# Patient Record
Sex: Male | Born: 1935 | Race: White | Hispanic: No | Smoking: Former smoker
Health system: Southern US, Community
[De-identification: ages and names within clinical notes are randomized; demographics above are authoritative.]

## PROBLEM LIST (undated history)

## (undated) DIAGNOSIS — T8203XA Leakage of heart valve prosthesis, initial encounter: Secondary | ICD-10-CM

## (undated) DIAGNOSIS — Z952 Presence of prosthetic heart valve: Secondary | ICD-10-CM

## (undated) DIAGNOSIS — R0602 Shortness of breath: Secondary | ICD-10-CM

## (undated) DIAGNOSIS — F32A Depression, unspecified: Secondary | ICD-10-CM

## (undated) DIAGNOSIS — K219 Gastro-esophageal reflux disease without esophagitis: Secondary | ICD-10-CM

## (undated) DIAGNOSIS — F1010 Alcohol abuse, uncomplicated: Secondary | ICD-10-CM

## (undated) DIAGNOSIS — I714 Abdominal aortic aneurysm, without rupture: Secondary | ICD-10-CM

## (undated) DIAGNOSIS — R42 Dizziness and giddiness: Secondary | ICD-10-CM

## (undated) DIAGNOSIS — M545 Low back pain, unspecified: Secondary | ICD-10-CM

## (undated) DIAGNOSIS — M1990 Unspecified osteoarthritis, unspecified site: Secondary | ICD-10-CM

## (undated) DIAGNOSIS — M419 Scoliosis, unspecified: Secondary | ICD-10-CM

## (undated) DIAGNOSIS — T8859XA Other complications of anesthesia, initial encounter: Secondary | ICD-10-CM

## (undated) DIAGNOSIS — F329 Major depressive disorder, single episode, unspecified: Secondary | ICD-10-CM

## (undated) DIAGNOSIS — T4145XA Adverse effect of unspecified anesthetic, initial encounter: Secondary | ICD-10-CM

## (undated) DIAGNOSIS — J449 Chronic obstructive pulmonary disease, unspecified: Secondary | ICD-10-CM

## (undated) DIAGNOSIS — G8929 Other chronic pain: Secondary | ICD-10-CM

## (undated) DIAGNOSIS — I509 Heart failure, unspecified: Secondary | ICD-10-CM

## (undated) DIAGNOSIS — I10 Essential (primary) hypertension: Secondary | ICD-10-CM

## (undated) HISTORY — DX: Leakage of heart valve prosthesis, initial encounter: T82.03XA

## (undated) HISTORY — DX: Essential (primary) hypertension: I10

## (undated) HISTORY — DX: Chronic obstructive pulmonary disease, unspecified: J44.9

## (undated) HISTORY — DX: Abdominal aortic aneurysm, without rupture: I71.4

## (undated) HISTORY — DX: Depression, unspecified: F32.A

## (undated) HISTORY — DX: Gastro-esophageal reflux disease without esophagitis: K21.9

## (undated) HISTORY — DX: Major depressive disorder, single episode, unspecified: F32.9

## (undated) HISTORY — PX: NO PAST SURGERIES: SHX2092

## (undated) HISTORY — DX: Dizziness and giddiness: R42

---

## 1997-08-19 ENCOUNTER — Ambulatory Visit (HOSPITAL_COMMUNITY): Admission: RE | Admit: 1997-08-19 | Discharge: 1997-08-19 | Payer: Self-pay | Admitting: Internal Medicine

## 1998-08-04 ENCOUNTER — Ambulatory Visit (HOSPITAL_COMMUNITY): Admission: RE | Admit: 1998-08-04 | Discharge: 1998-08-04 | Payer: Self-pay | Admitting: Internal Medicine

## 2000-12-28 ENCOUNTER — Ambulatory Visit (HOSPITAL_COMMUNITY): Admission: RE | Admit: 2000-12-28 | Discharge: 2000-12-28 | Payer: Self-pay | Admitting: Internal Medicine

## 2001-03-21 ENCOUNTER — Emergency Department (HOSPITAL_COMMUNITY): Admission: EM | Admit: 2001-03-21 | Discharge: 2001-03-21 | Payer: Self-pay | Admitting: Emergency Medicine

## 2001-04-02 ENCOUNTER — Emergency Department (HOSPITAL_COMMUNITY): Admission: EM | Admit: 2001-04-02 | Discharge: 2001-04-02 | Payer: Self-pay | Admitting: Emergency Medicine

## 2002-02-12 ENCOUNTER — Inpatient Hospital Stay (HOSPITAL_COMMUNITY): Admission: EM | Admit: 2002-02-12 | Discharge: 2002-02-16 | Payer: Self-pay | Admitting: Psychiatry

## 2002-02-13 ENCOUNTER — Encounter (HOSPITAL_COMMUNITY): Payer: Self-pay | Admitting: Psychiatry

## 2005-06-23 ENCOUNTER — Emergency Department (HOSPITAL_COMMUNITY): Admission: EM | Admit: 2005-06-23 | Discharge: 2005-06-23 | Payer: Self-pay | Admitting: Family Medicine

## 2006-07-11 ENCOUNTER — Ambulatory Visit: Payer: Self-pay | Admitting: *Deleted

## 2006-07-11 ENCOUNTER — Encounter (INDEPENDENT_AMBULATORY_CARE_PROVIDER_SITE_OTHER): Payer: Self-pay | Admitting: *Deleted

## 2006-07-11 DIAGNOSIS — R1031 Right lower quadrant pain: Secondary | ICD-10-CM | POA: Insufficient documentation

## 2006-07-11 DIAGNOSIS — R011 Cardiac murmur, unspecified: Secondary | ICD-10-CM | POA: Insufficient documentation

## 2006-07-11 DIAGNOSIS — R0989 Other specified symptoms and signs involving the circulatory and respiratory systems: Secondary | ICD-10-CM

## 2006-07-11 DIAGNOSIS — R0609 Other forms of dyspnea: Secondary | ICD-10-CM

## 2006-07-11 LAB — CONVERTED CEMR LAB
Bilirubin Urine: NEGATIVE
Protein, ur: NEGATIVE mg/dL
Urobilinogen, UA: 0.2 (ref 0.0–1.0)

## 2006-07-12 ENCOUNTER — Ambulatory Visit: Payer: Self-pay | Admitting: Internal Medicine

## 2006-07-12 ENCOUNTER — Encounter (INDEPENDENT_AMBULATORY_CARE_PROVIDER_SITE_OTHER): Payer: Self-pay | Admitting: *Deleted

## 2006-07-12 LAB — CONVERTED CEMR LAB
AST: 24 units/L (ref 0–37)
Albumin: 3.9 g/dL (ref 3.5–5.2)
Alkaline Phosphatase: 60 units/L (ref 39–117)
Basophils Absolute: 0 10*3/uL (ref 0.0–0.1)
Basophils Relative: 1 % (ref 0–1)
Eosinophils Absolute: 0.2 10*3/uL (ref 0.0–0.7)
LDL Cholesterol: 111 mg/dL — ABNORMAL HIGH (ref 0–99)
MCHC: 33.8 g/dL (ref 30.0–36.0)
MCV: 94.1 fL (ref 78.0–100.0)
Neutrophils Relative %: 55 % (ref 43–77)
Platelets: 140 10*3/uL — ABNORMAL LOW (ref 150–400)
Potassium: 4.2 meq/L (ref 3.5–5.3)
Sodium: 142 meq/L (ref 135–145)
TSH: 1.201 microintl units/mL (ref 0.350–5.50)
Total Protein: 7.1 g/dL (ref 6.0–8.3)
WBC: 4 10*3/uL (ref 4.0–10.5)

## 2006-07-18 ENCOUNTER — Ambulatory Visit: Payer: Self-pay | Admitting: Internal Medicine

## 2006-07-18 ENCOUNTER — Ambulatory Visit (HOSPITAL_COMMUNITY): Admission: RE | Admit: 2006-07-18 | Discharge: 2006-07-18 | Payer: Self-pay | Admitting: Internal Medicine

## 2006-07-18 ENCOUNTER — Encounter: Payer: Self-pay | Admitting: Internal Medicine

## 2006-07-18 ENCOUNTER — Encounter (INDEPENDENT_AMBULATORY_CARE_PROVIDER_SITE_OTHER): Payer: Self-pay | Admitting: Internal Medicine

## 2006-07-25 ENCOUNTER — Ambulatory Visit: Payer: Self-pay | Admitting: Internal Medicine

## 2006-07-25 ENCOUNTER — Encounter (INDEPENDENT_AMBULATORY_CARE_PROVIDER_SITE_OTHER): Payer: Self-pay | Admitting: *Deleted

## 2006-08-06 ENCOUNTER — Ambulatory Visit: Payer: Self-pay | Admitting: Cardiology

## 2007-07-03 ENCOUNTER — Emergency Department (HOSPITAL_COMMUNITY): Admission: EM | Admit: 2007-07-03 | Discharge: 2007-07-03 | Payer: Self-pay | Admitting: Emergency Medicine

## 2007-07-08 ENCOUNTER — Encounter (INDEPENDENT_AMBULATORY_CARE_PROVIDER_SITE_OTHER): Payer: Self-pay | Admitting: *Deleted

## 2007-07-08 ENCOUNTER — Ambulatory Visit: Payer: Self-pay | Admitting: Infectious Disease

## 2007-07-08 DIAGNOSIS — R51 Headache: Secondary | ICD-10-CM

## 2007-07-08 DIAGNOSIS — R519 Headache, unspecified: Secondary | ICD-10-CM | POA: Insufficient documentation

## 2007-07-08 DIAGNOSIS — F1010 Alcohol abuse, uncomplicated: Secondary | ICD-10-CM

## 2007-07-09 LAB — CONVERTED CEMR LAB
ALT: 54 units/L — ABNORMAL HIGH (ref 0–53)
AST: 74 units/L — ABNORMAL HIGH (ref 0–37)
Albumin: 3.7 g/dL (ref 3.5–5.2)
BUN: 8 mg/dL (ref 6–23)
Basophils Relative: 1 % (ref 0–1)
Calcium: 8.8 mg/dL (ref 8.4–10.5)
Chloride: 105 meq/L (ref 96–112)
MCHC: 34.9 g/dL (ref 30.0–36.0)
Monocytes Relative: 12 % (ref 3–12)
Neutro Abs: 2.8 10*3/uL (ref 1.7–7.7)
Neutrophils Relative %: 52 % (ref 43–77)
Platelets: 185 10*3/uL (ref 150–400)
Potassium: 4.5 meq/L (ref 3.5–5.3)
RBC: 4.64 M/uL (ref 4.22–5.81)
Sodium: 143 meq/L (ref 135–145)
Total Protein: 6.9 g/dL (ref 6.0–8.3)
WBC: 5.5 10*3/uL (ref 4.0–10.5)

## 2007-07-10 ENCOUNTER — Encounter: Payer: Self-pay | Admitting: Internal Medicine

## 2007-07-10 ENCOUNTER — Ambulatory Visit: Payer: Self-pay | Admitting: Internal Medicine

## 2007-07-10 ENCOUNTER — Ambulatory Visit: Admission: RE | Admit: 2007-07-10 | Discharge: 2007-07-10 | Payer: Self-pay | Admitting: Internal Medicine

## 2007-07-11 ENCOUNTER — Encounter (INDEPENDENT_AMBULATORY_CARE_PROVIDER_SITE_OTHER): Payer: Self-pay | Admitting: *Deleted

## 2007-07-11 ENCOUNTER — Ambulatory Visit: Payer: Self-pay | Admitting: Infectious Disease

## 2007-07-11 DIAGNOSIS — R42 Dizziness and giddiness: Secondary | ICD-10-CM | POA: Insufficient documentation

## 2007-07-19 ENCOUNTER — Ambulatory Visit: Payer: Self-pay | Admitting: *Deleted

## 2007-07-19 ENCOUNTER — Ambulatory Visit: Payer: Self-pay | Admitting: Cardiology

## 2007-07-19 ENCOUNTER — Ambulatory Visit: Payer: Self-pay | Admitting: Internal Medicine

## 2007-07-19 ENCOUNTER — Inpatient Hospital Stay (HOSPITAL_COMMUNITY): Admission: AD | Admit: 2007-07-19 | Discharge: 2007-07-23 | Payer: Self-pay | Admitting: *Deleted

## 2007-07-19 DIAGNOSIS — L03119 Cellulitis of unspecified part of limb: Secondary | ICD-10-CM

## 2007-07-19 DIAGNOSIS — F329 Major depressive disorder, single episode, unspecified: Secondary | ICD-10-CM

## 2007-07-19 DIAGNOSIS — L02619 Cutaneous abscess of unspecified foot: Secondary | ICD-10-CM

## 2007-07-20 ENCOUNTER — Ambulatory Visit: Payer: Self-pay | Admitting: Vascular Surgery

## 2007-07-20 ENCOUNTER — Encounter: Payer: Self-pay | Admitting: Internal Medicine

## 2007-07-24 ENCOUNTER — Encounter (HOSPITAL_BASED_OUTPATIENT_CLINIC_OR_DEPARTMENT_OTHER): Admission: RE | Admit: 2007-07-24 | Discharge: 2007-10-22 | Payer: Self-pay | Admitting: Surgery

## 2007-07-26 ENCOUNTER — Encounter (INDEPENDENT_AMBULATORY_CARE_PROVIDER_SITE_OTHER): Payer: Self-pay | Admitting: *Deleted

## 2007-08-08 ENCOUNTER — Encounter: Payer: Self-pay | Admitting: Internal Medicine

## 2007-08-09 ENCOUNTER — Ambulatory Visit: Payer: Self-pay | Admitting: *Deleted

## 2007-08-21 ENCOUNTER — Ambulatory Visit: Payer: Self-pay | Admitting: Cardiology

## 2007-08-28 ENCOUNTER — Ambulatory Visit: Payer: Self-pay

## 2007-08-28 LAB — CONVERTED CEMR LAB
Alkaline Phosphatase: 50 units/L (ref 39–117)
Bilirubin, Direct: 0.1 mg/dL (ref 0.0–0.3)
Cholesterol: 141 mg/dL (ref 0–200)
LDL Cholesterol: 97 mg/dL (ref 0–99)
Total Bilirubin: 1.1 mg/dL (ref 0.3–1.2)
Total CHOL/HDL Ratio: 4.3
VLDL: 11 mg/dL (ref 0–40)

## 2007-09-11 ENCOUNTER — Encounter (INDEPENDENT_AMBULATORY_CARE_PROVIDER_SITE_OTHER): Payer: Self-pay | Admitting: *Deleted

## 2007-10-03 ENCOUNTER — Encounter (INDEPENDENT_AMBULATORY_CARE_PROVIDER_SITE_OTHER): Payer: Self-pay | Admitting: *Deleted

## 2007-10-10 ENCOUNTER — Encounter (INDEPENDENT_AMBULATORY_CARE_PROVIDER_SITE_OTHER): Payer: Self-pay | Admitting: *Deleted

## 2007-12-05 ENCOUNTER — Ambulatory Visit: Payer: Self-pay | Admitting: Internal Medicine

## 2008-01-16 ENCOUNTER — Inpatient Hospital Stay (HOSPITAL_COMMUNITY): Admission: AD | Admit: 2008-01-16 | Discharge: 2008-01-28 | Payer: Self-pay | Admitting: Psychiatry

## 2008-01-16 ENCOUNTER — Ambulatory Visit: Payer: Self-pay | Admitting: Psychiatry

## 2008-01-21 ENCOUNTER — Encounter (INDEPENDENT_AMBULATORY_CARE_PROVIDER_SITE_OTHER): Payer: Self-pay | Admitting: *Deleted

## 2008-03-03 ENCOUNTER — Encounter: Payer: Self-pay | Admitting: Emergency Medicine

## 2008-03-03 ENCOUNTER — Ambulatory Visit: Payer: Self-pay | Admitting: Psychiatry

## 2008-03-04 ENCOUNTER — Encounter (HOSPITAL_COMMUNITY): Payer: Self-pay | Admitting: Psychiatry

## 2008-03-04 ENCOUNTER — Inpatient Hospital Stay (HOSPITAL_COMMUNITY): Admission: RE | Admit: 2008-03-04 | Discharge: 2008-03-10 | Payer: Self-pay | Admitting: Psychiatry

## 2008-03-30 ENCOUNTER — Ambulatory Visit: Payer: Self-pay | Admitting: Cardiology

## 2008-04-09 ENCOUNTER — Ambulatory Visit: Payer: Self-pay

## 2008-04-09 ENCOUNTER — Encounter: Payer: Self-pay | Admitting: Cardiology

## 2008-09-03 ENCOUNTER — Ambulatory Visit: Payer: Self-pay | Admitting: Cardiology

## 2008-09-03 DIAGNOSIS — J449 Chronic obstructive pulmonary disease, unspecified: Secondary | ICD-10-CM

## 2008-12-29 ENCOUNTER — Encounter (INDEPENDENT_AMBULATORY_CARE_PROVIDER_SITE_OTHER): Payer: Self-pay | Admitting: *Deleted

## 2009-03-12 ENCOUNTER — Emergency Department (HOSPITAL_COMMUNITY): Admission: EM | Admit: 2009-03-12 | Discharge: 2009-03-12 | Payer: Self-pay | Admitting: Family Medicine

## 2009-04-01 ENCOUNTER — Encounter (INDEPENDENT_AMBULATORY_CARE_PROVIDER_SITE_OTHER): Payer: Self-pay | Admitting: *Deleted

## 2009-04-14 ENCOUNTER — Other Ambulatory Visit: Payer: Self-pay | Admitting: Emergency Medicine

## 2009-04-14 ENCOUNTER — Inpatient Hospital Stay (HOSPITAL_COMMUNITY): Admission: EM | Admit: 2009-04-14 | Discharge: 2009-04-21 | Payer: Self-pay | Admitting: Psychiatry

## 2009-04-14 ENCOUNTER — Ambulatory Visit: Payer: Self-pay | Admitting: Psychiatry

## 2009-05-25 ENCOUNTER — Encounter: Payer: Self-pay | Admitting: Cardiology

## 2009-05-28 ENCOUNTER — Ambulatory Visit (HOSPITAL_COMMUNITY): Admission: RE | Admit: 2009-05-28 | Discharge: 2009-05-28 | Payer: Self-pay | Admitting: Cardiology

## 2009-05-28 ENCOUNTER — Ambulatory Visit: Payer: Self-pay

## 2009-05-28 ENCOUNTER — Ambulatory Visit: Payer: Self-pay | Admitting: Internal Medicine

## 2009-05-28 ENCOUNTER — Encounter: Payer: Self-pay | Admitting: Cardiology

## 2009-05-31 ENCOUNTER — Encounter: Payer: Self-pay | Admitting: Cardiovascular Disease

## 2009-06-03 ENCOUNTER — Ambulatory Visit: Payer: Self-pay | Admitting: Cardiology

## 2009-10-01 IMAGING — CT CT HEAD W/O CM
4 of 10 series · 18 of 37 positions shown, 19 images · non-contrast
Comparison: None

CT HEAD

CLINICAL DATA: Fall.  Laceration to the back of the head.  Right
neck pain.

CT HEAD WITHOUT CONTRAST
CT CERVICAL SPINE WITHOUT CONTRAST
TECHNIQUE: Multidetector CT imaging of the head and cervical spine
was performed following the standard protocol without IV contrast.
Multiplanar CT image reconstructions of the cervical spine were
also generated.

[Series 3: recon 2: brain · axial · 0.47mm/px · z∈[-82,+3]mm · 4 of 56 slices shown, 5 images]
[im 12/56  brain]
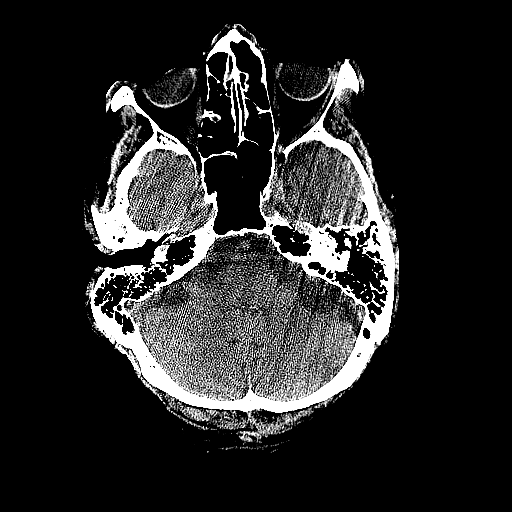
[im 12/56  bone]
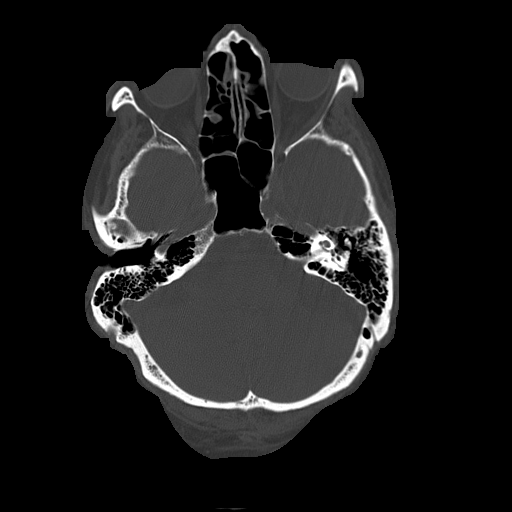
[im 23/56  brain]
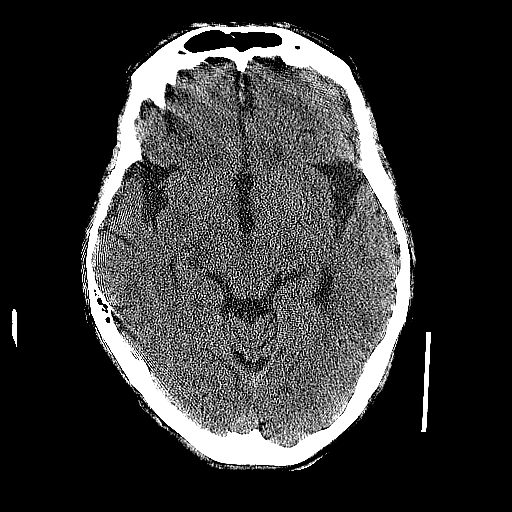
[im 34/56  brain]
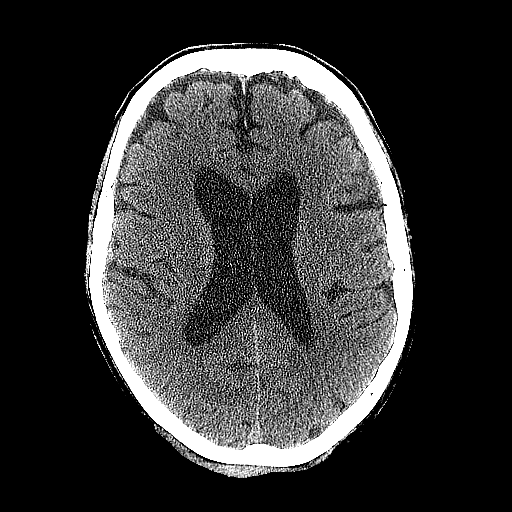
[im 45/56  brain]
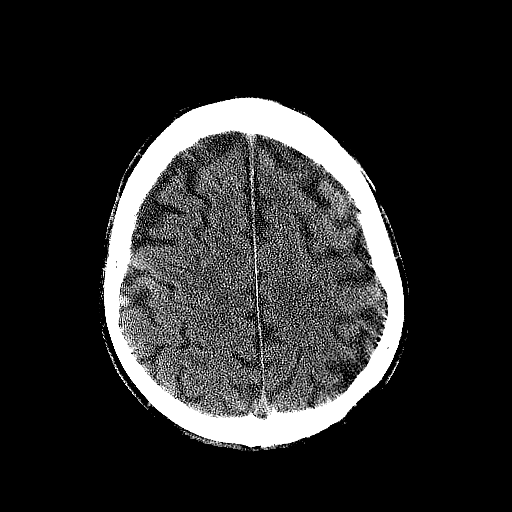

[Series 6: cervical spine · axial · 0.27mm/px · z∈[-308,-201]mm · 5 of 85 slices shown]
[im 11/85  brain]
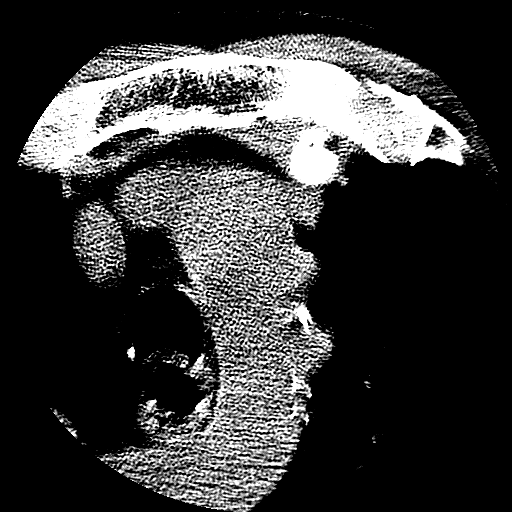
[im 22/85  brain]
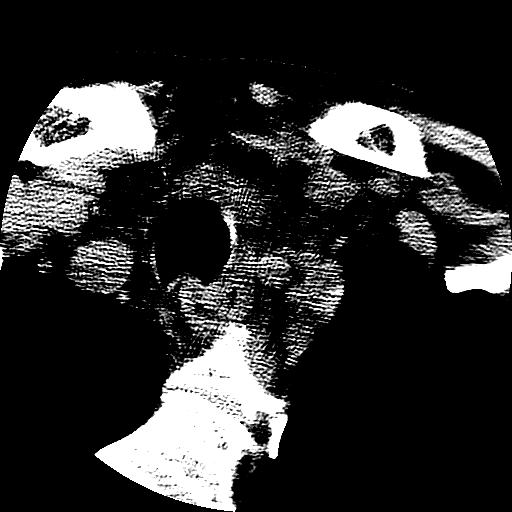
[im 32/85  brain]
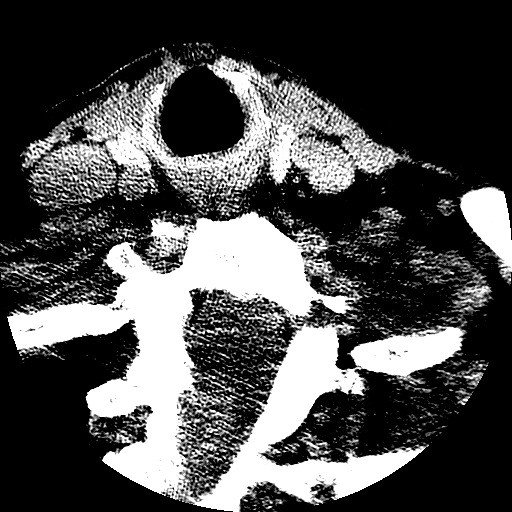
[im 43/85  brain]
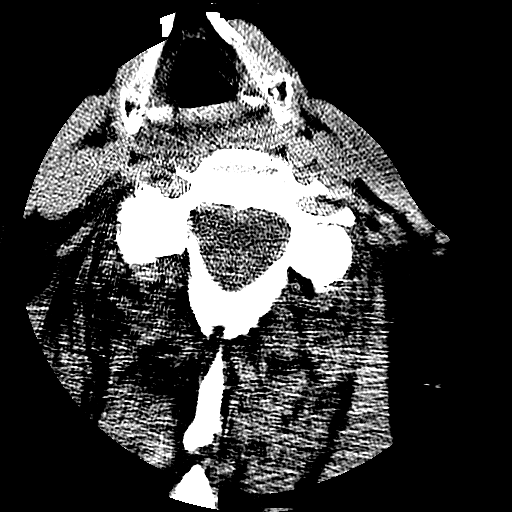
[im 53/85  brain]
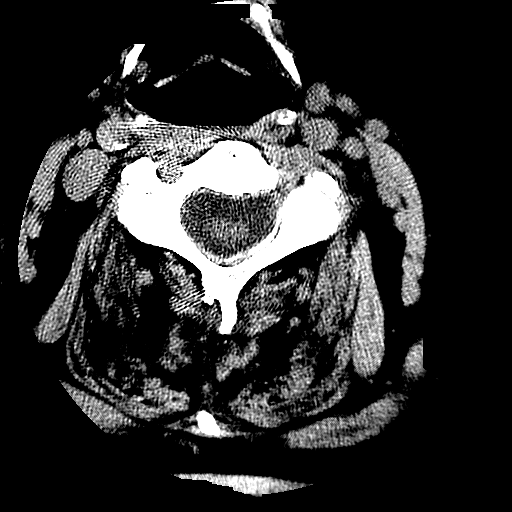

[Series 7: recon 2: cervical spine · axial · 0.27mm/px · z∈[-311,-146]mm · 8 of 86 slices shown]
[im 10/86  brain]
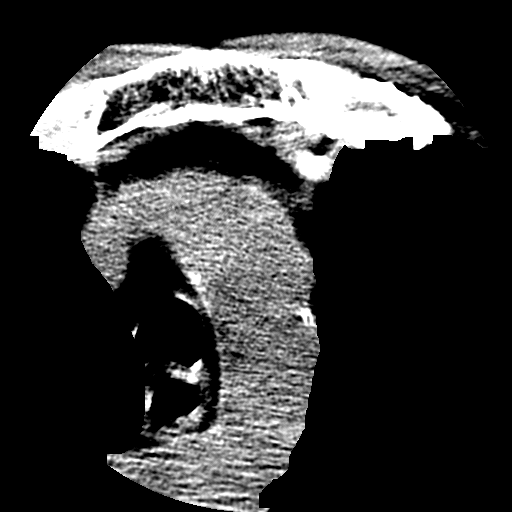
[im 19/86  brain]
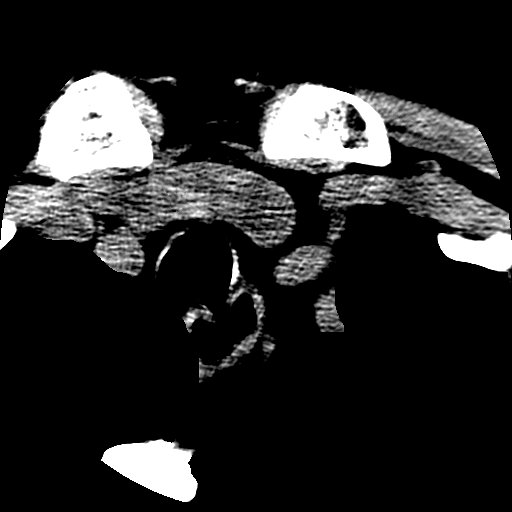
[im 29/86  brain]
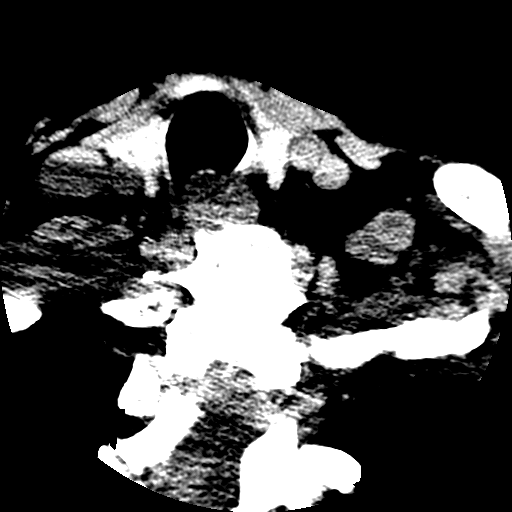
[im 38/86  brain]
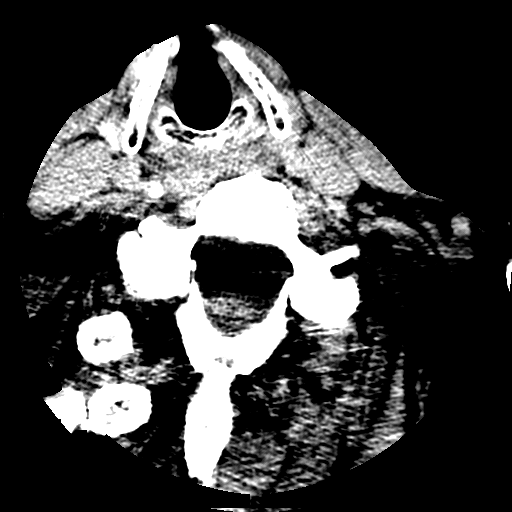
[im 48/86  brain]
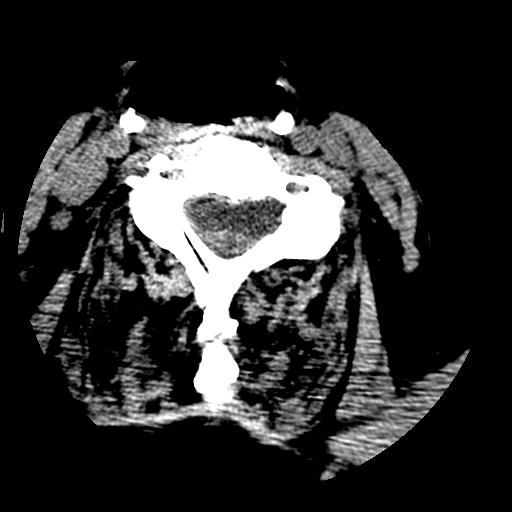
[im 57/86  brain]
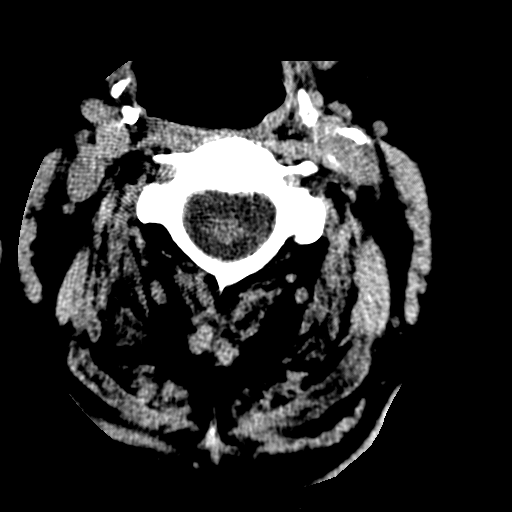
[im 67/86  brain]
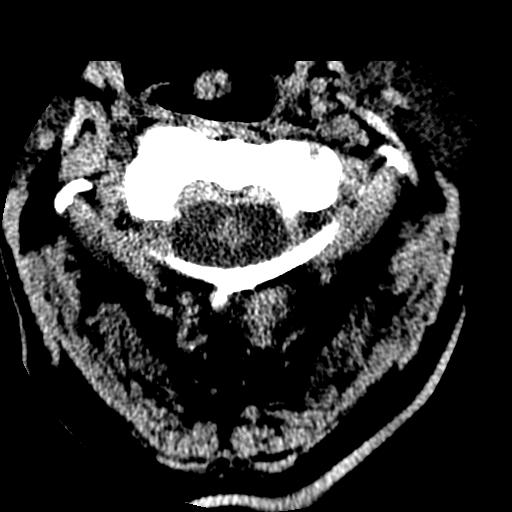
[im 76/86  brain]
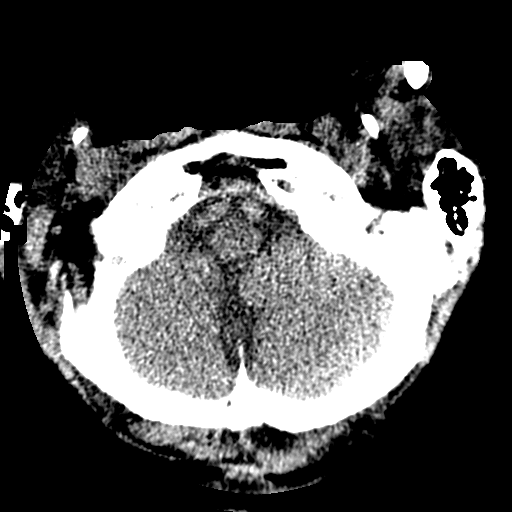

[Series 107: reformatted · coronal · 0.41mm/px · 1 of 36 slices shown]
[im 18/36  brain]
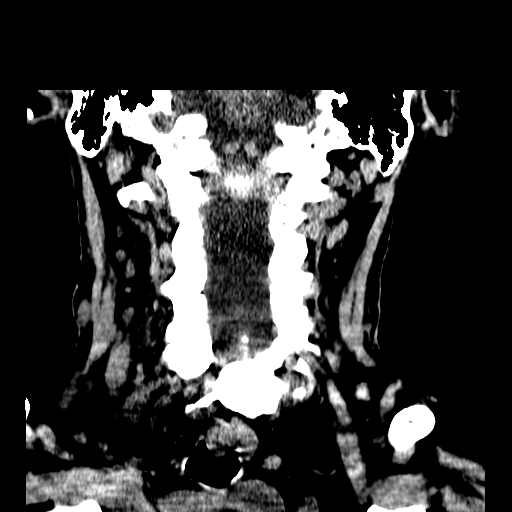

[18 of 37 positions shown; findings below may reference images not displayed]

FINDINGS: The brain stem, cerebellum, cerebral peduncles, thalami,
basal ganglia, basilar cisterns, and ventricular system appear
unremarkable.  No intracranial hemorrhage, mass lesion, or acute
CVA is identified.  Right occipital parietal subcutaneous scalp
hematoma noted.  There is mild chronic ethmoid sinusitis.
IMPRESSION: 1.  Mild right occipital parietal scalp hematoma.  No acute
intracranial findings.
2.  Mild chronic ethmoid sinusitis

CT CERVICAL SPINE
FINDINGS: There is markedly exaggerated cervical lordosis.  There
is partial fusion of the C2-3 level.  No cervical spine fracture or
acute subluxation is identified.
IMPRESSION: 1.  No cervical spine fracture or acute subluxation is identified.
2.  Markedly exaggerated cervical lordosis.

## 2010-02-09 ENCOUNTER — Telehealth (INDEPENDENT_AMBULATORY_CARE_PROVIDER_SITE_OTHER): Payer: Self-pay | Admitting: *Deleted

## 2010-03-04 ENCOUNTER — Encounter: Payer: Self-pay | Admitting: Cardiology

## 2010-03-04 ENCOUNTER — Ambulatory Visit: Payer: Self-pay | Admitting: Cardiology

## 2010-03-04 DIAGNOSIS — I10 Essential (primary) hypertension: Secondary | ICD-10-CM

## 2010-03-14 ENCOUNTER — Emergency Department (HOSPITAL_COMMUNITY)
Admission: EM | Admit: 2010-03-14 | Discharge: 2010-03-14 | Payer: Self-pay | Source: Home / Self Care | Admitting: Emergency Medicine

## 2010-03-16 ENCOUNTER — Ambulatory Visit (HOSPITAL_COMMUNITY)
Admission: RE | Admit: 2010-03-16 | Discharge: 2010-03-16 | Payer: Self-pay | Source: Home / Self Care | Attending: Cardiology | Admitting: Cardiology

## 2010-03-16 ENCOUNTER — Ambulatory Visit: Payer: Self-pay

## 2010-03-16 ENCOUNTER — Encounter: Payer: Self-pay | Admitting: Cardiology

## 2010-03-22 ENCOUNTER — Ambulatory Visit: Payer: Self-pay | Admitting: Cardiology

## 2010-05-05 NOTE — Assessment & Plan Note (Signed)
Summary: 6 MONTH ROV/SL   Visit Type:  6 mo f/u Primary Samuel [name]:  Samuel Hayward MD  CC:  sob all the time...denies any cp or edema or leg pain.  History of Present Illness: [name] is a gentleman with a history of aortic stenosis as well as alcohol abuse.   We scheduled him to have a Myoview, which was performed on Aug 28, 2007.  There was no ischemia or infarction and his Ejection fraction was 65%. Last echocardiogram in February of 2011 showed normal LV function, diastolic dysfunction and moderate to severe aortic stenosis with a mean gradient of 36 mm mercury. I last saw him in March of 2011. Since then he does have some dyspnea on exertion. There is no orthopnea, PND or pedal edema. He states he has had this his entire life. He continues to have dizziness which she also states is long-standing. It is unchanged and he has not had syncope. There is no chest pain.  Current Medications (verified): 1)  Folic Acid 1 Mg  Tabs (Folic Acid) .... Take 1 Tablet By Mouth Once A Day 2)  B-100  Tabs (Vitamins-Lipotropics) .Marland Kitchen.. 1 Tab By Mouth Once Daily 3)  [place] .Marland Kitchen.. 1 Tab By Mouth Once Daily 4)  Copper .Marland Kitchen.. 1 Tab By Mouth Once Daily 5)  Ester-C  Tabs (Bioflavonoid Products) .Marland Kitchen.. 1 Tab By Mouth Once Daily 6)  Alka-Seltzer Antacid 500 Mg Caps (Calcium Carbonate Antacid) .Marland Kitchen.. 1 Tab By Mouth Once Daily  Allergies (verified): No Known Drug Allergies  Past History:  Past Medical History: Reviewed history from 06/03/2009 and no changes required. COPD (ICD-496) DIZZINESS, CHRONIC (ICD-780.4) AORTIC STENOSIS (ICD-424.1) DEPRESSION (ICD-311) Sinus problems GERD ETOH abuse started 1974 (mostly hard liquour)  (stopped Nov 2004). Currently has restarted  Past Surgical History: Reviewed history from 07/11/2006 and no changes required. None  Social History: Reviewed history from 09/03/2008 and no changes required. Retired: worked for [employer] in [place] [employer] Divorced:  1st and  2nd died and 3rd was separated Never Smoked Alcohol use-yes  Review of Systems       no fevers or chills, productive cough, hemoptysis, dysphasia, odynophagia, melena, hematochezia, dysuria, hematuria, rash, seizure activity, orthopnea, PND, pedal edema, claudication. Remaining systems are negative.   Vital Signs:  Patient profile:   77 year old male Height:      71 inches Weight:      165.75 pounds BMI:     23.20 Pulse rate:   84 / minute Pulse rhythm:   regular BP sitting:   162 / 86  (left arm) Cuff size:   regular  Vitals Entered By: Danielle Rankin, CMA (March 04, 2010 8:50 AM)  Physical Exam  General:  Well-developed well-nourished in no acute distress.  Skin is warm and dry.  HEENT is normal.  Neck is supple. No thyromegaly.  Chest is clear to auscultation with normal expansion.  Cardiovascular exam is regular rate and rhythm. 3/6 systolic murmur left sternal border. S2 is diminished. Abdominal exam nontender or distended. No masses palpated. Extremities show no edema. neuro grossly intact    EKG  Procedure date:  03/04/2010  Findings:      Sinus rhythm, nonspecific ST changes.  Impression & Recommendations:  Problem # 1:  AORTIC STENOSIS (ICD-424.1) Plan repeat echocardiogram. His symptoms are difficult to assess. He is dyspneic but this has been a long-standing problem. There is no chest pain or syncope. I have explained that he will most likely require aortic valve  replacement in the near future. Orders: Echocardiogram (Echo)  Problem # 2:  ESSENTIAL HYPERTENSION, BENIGN (ICD-401.1) His blood pressure appears to be running high. He will follow this at home. I will add in antihypertensive if his systolic is over 956 or diastolic over 85.  Problem # 3:  COPD (ICD-496)  Problem # 4:  DIZZINESS, CHRONIC (ICD-780.4)  Patient Instructions: 1)  Your physician has requested that you have an echocardiogram.  Echocardiography is a painless test that uses sound  waves to create images of your heart. It provides your doctor with information about the size and shape of your heart and how well your heart's chambers and valves are working.  This procedure takes approximately one hour. There are no restrictions for this procedure.  Appended Document: 6 MONTH ROV/SL    Clinical Lists Changes  Orders: Added new Referral order of Echocardiogram (Echo) - Signed Observations: Added new observation of PI CARDIO: Your physician has requested that you have an echocardiogram.  Echocardiography is a painless test that uses sound waves to create images of your heart. It provides your doctor with information about the size and shape of your heart and how well your heart's chambers and valves are working.  This procedure takes approximately one hour. There are no restrictions for this procedure.   Your physician wants you to follow-up in: 6 months with Dr Jens Som.  You will receive a reminder letter in the mail two months in advance. If you don't receive a letter, please call our office to schedule the follow-up appointment. (03/04/2010 9:18)       Patient Instructions: 1)  Your physician has requested that you have an echocardiogram.  Echocardiography is a painless test that uses sound waves to create images of your heart. It provides your doctor with information about the size and shape of your heart and how well your heart's chambers and valves are working.  This procedure takes approximately one hour. There are no restrictions for this procedure. 2)  Your physician wants you to follow-up in: 6 months with Dr Jens Som.  You will receive a reminder letter in the mail two months in advance. If you don't receive a letter, please call our office to schedule the follow-up appointment.

## 2010-05-05 NOTE — Letter (Signed)
Summary: [facility] Surgical Clearance  [facility]   Imported By: Roderic Ovens 06/18/2009 13:22:53  _____________________________________________________________________  External Attachment:    Type:   Image     Comment:   External Document

## 2010-05-05 NOTE — Assessment & Plan Note (Signed)
Summary: ROV/DISCUSS ECHO/CLEARENCE FOR CATERACT SURGERY/DM   Visit Type:  Follow-up Primary Provider:  [name] MD   History of Present Illness: Samuel [name] is a gentleman with a history of aortic stenosis as well as alcohol abuse.   We scheduled him to have a Myoview, which was performed on Aug 28, 2007.  There was no ischemia or infarction and his ejection fraction was 65%. Last echocardiogram in Dec of 2011 showed normal LV function and severe aortic stenosis with a mean gradient of 57 mm mercury; trivial AI, mild biatrial enlargement. I last saw him in Dec of 2011. Since then he does note dyspnea on exertion relieved with rest. This occurs with more moderate activities but not if he keeps his pace relatively slow. No orthopnea, PND, pedal edema, chest pain or syncope.   Current Medications (verified): 1)  Folic Acid 1 Mg  Tabs (Folic Acid) .... Take 1 Tablet By Mouth Once A Day 2)  B-100  Tabs (Vitamins-Lipotropics) .Marland Kitchen.. 1 Tab By Mouth As Needed 3)  Ester-C  Tabs (Bioflavonoid Products) .Marland Kitchen.. 1 Tab By Mouth Once Daily 4)  Alka-Seltzer Antacid 500 Mg Caps (Calcium Carbonate Antacid) .Marland Kitchen.. 1 Tab By Mouth Once Daily  Allergies (verified): No Known Drug Allergies  Past History:  Past Medical History: Reviewed history from 06/03/2009 and no changes required. COPD (ICD-496) DIZZINESS, CHRONIC (ICD-780.4) AORTIC STENOSIS (ICD-424.1) DEPRESSION (ICD-311) Sinus problems GERD ETOH abuse started 1974 (mostly hard liquour)  (stopped Nov 2004). Currently has restarted  Social History: Reviewed history from 09/03/2008 and no changes required. Retired: worked for [employer] in [place] [employer] Divorced:  1st and 2nd died and 3rd was separated Never Smoked Alcohol use-yes  Review of Systems       Chronic dizziness but no fevers or chills, productive cough, hemoptysis, dysphasia, odynophagia, melena, hematochezia, dysuria, hematuria, rash, seizure activity, orthopnea, PND, pedal  edema, claudication. Remaining systems are negative.   Vital Signs:  Patient profile:   77 year old male Height:      71 inches Weight:      164 pounds Pulse rate:   72 / minute Pulse rhythm:   regular BP sitting:   152 / 84  (left arm)  Physical Exam  General:  Well-developed well-nourished in no acute distress.  Skin is warm and dry.  HEENT is normal.  Neck is supple. No thyromegaly.  Chest is clear to auscultation with normal expansion.  Cardiovascular exam is regular rate and rhythm.  Abdominal exam nontender or distended. No masses palpated. Extremities show no edema. neuro grossly intact    Impression & Recommendations:  Problem # 1:  AORTIC STENOSIS (ICD-424.1) I have had long discussions with the patient today concerning his aortic stenosis. I explained that it is now severe and has progressed significantly compared to previous. I recommended aortic valve replacement. However he stated that I am 77 years old and would not want to pursue that at present. He states that his symptoms are tolerable. He also stated he might consider this in the future if his symptoms worsen. I explained the symptoms that he should be aware of including increasing dyspnea on exertion, chest pain and syncope. I also explained the risk of sudden cardiac death associated with severe aortic stenosis. I explained that this is a surgical problem. He understands all of the above and would prefer conservative management at this point. I will see him back in 6 months and repeat his echocardiogram at that time. I will see him sooner  if his symptoms worsen.  Problem # 2:  ESSENTIAL HYPERTENSION, BENIGN (ICD-401.1) His blood pressure is mildly elevated. However given the severity of his aortic stenosis I would not add antihypertensives at this point.  Problem # 3:  COPD (ICD-496)  Patient Instructions: 1)  Your physician recommends that you schedule a follow-up appointment in: 6 MONTHS WITH DR CRENSHAW 2)   Your physician recommends that you continue on your current medications as directed. Please refer to the Current Medication list given to you today.

## 2010-05-05 NOTE — Consult Note (Signed)
Summary: [facility]  [facility]   Imported By: Florinda Marker 06/09/2009 14:15:40  _____________________________________________________________________  External Attachment:    Type:   Image     Comment:   External Document  Appended Document: [facility] ok for 1 hour local anesthesia for cataracts

## 2010-05-05 NOTE — Assessment & Plan Note (Signed)
Summary: f/u echo done 05-28-09   Primary Provider:  [name] MD  CC:  dizziness and sob.  History of Present Illness: Samuel [name] is a gentleman who I last saw on March 22, 2008.  He has a history of aortic stenosis as well as alcohol abuse.   We scheduled him to have a Myoview, which was performed on Aug 28, 2007.  There was no ischemia or infarction and his Ejection fraction was 65%. Last echocardiogram in February of 2011 showed normal LV function, diastolic dysfunction and moderate to severe aortic stenosis with a mean gradient of 36 mm mercury. I last saw him in June of 2010. Since then he continues to have dyspnea on exertion relieved with rest. It is not associated with chest pain. He states this has been present for years and is unchanged. There is no orthopnea, PND or pedal edema. He also has some dizziness at times particularly when he stands suddenly. This has also been present for years. He's had no syncope.  Current Medications (verified): 1)  Folic Acid 1 Mg  Tabs (Folic Acid) .... Take 1 Tablet By Mouth Once A Day 2)  B-100  Tabs (Vitamins-Lipotropics) .Marland Kitchen.. 1 Tab By Mouth Once Daily 3)  [place] .Marland Kitchen.. 1 Tab By Mouth Once Daily 4)  Copper .Marland Kitchen.. 1 Tab By Mouth Once Daily 5)  Ester-C  Tabs (Bioflavonoid Products) .Marland Kitchen.. 1 Tab By Mouth Once Daily 6)  Alka-Seltzer Antacid 500 Mg Caps (Calcium Carbonate Antacid) .Marland Kitchen.. 1 Tab By Mouth Once Daily  Allergies: No Known Drug Allergies  Past History:  Past Medical History: COPD (ICD-496) DIZZINESS, CHRONIC (ICD-780.4) AORTIC STENOSIS (ICD-424.1) DEPRESSION (ICD-311) Sinus problems GERD ETOH abuse started 1974 (mostly hard liquour)  (stopped Nov 2004). Currently has restarted  Social History: Reviewed history from 09/03/2008 and no changes required. Retired: worked for [employer] in [place] [employer] Divorced:  1st and 2nd died and 3rd was separated Never Smoked Alcohol use-yes  Review of Systems       no fevers or  chills, productive cough, hemoptysis, dysphasia, odynophagia, melena, hematochezia, dysuria, hematuria, rash, seizure activity, orthopnea, PND, pedal edema, claudication. Remaining systems are negative.   Vital Signs:  Patient profile:   77 year old male Height:      71 inches Weight:      164 pounds BMI:     22.96 Pulse rate:   67 / minute Resp:     12 per minute BP sitting:   150 / 80  (left arm)  Vitals Entered By: Kem Parkinson (June 03, 2009 10:43 AM)  Physical Exam  General:  Well-developed frail in no acute distress.  Skin is warm and dry.  HEENT is normal.  Neck is supple. No thyromegaly.  Chest is clear to auscultation with normal expansion.  Cardiovascular exam is regular rate and rhythm. 3/6 systolic murmur left sternal border. S2 is diminished. Abdominal exam nontender or distended. No masses palpated. Extremities show no edema. neuro grossly intact    EKG  Procedure date:  06/03/2009  Findings:      normal sinus rhythm at a rate of 67. Axis normal. No ST changes.  Impression & Recommendations:  Problem # 1:  AORTIC STENOSIS (ICD-424.1) Patient's aortic stenosis is moderate to severe. He does have some dyspnea on exertion and dizziness both of which are chronic and unchanged. I. therefore am not convinced that his aortic stenosis is causing these symptoms. I did approach the subject of aortic valve replacement as I  think he will require this in the future. However he does not want to consider this at present and would prefer to avoid this if at all possible. I will see him back in 6 months to review his symptoms.  Problem # 2:  COPD (ICD-496) Management per primary care.  Problem # 3:  DIZZINESS, CHRONIC (ICD-780.4)  Problem # 4:  ALCOHOL ABUSE (ICD-305.00)  Problem # 5:  DYSPNEA ON EXERTION (ICD-786.09) There is most likely a component of COPD. Certainly his aortic stenosis may be contributing as well.  Patient Instructions: 1)  Your physician  recommends that you schedule a follow-up appointment in: 6 MONTHS

## 2010-05-05 NOTE — Progress Notes (Signed)
  Walk in Patient Form Recieved " Pt has questions for [place]" sent to [facility]  February 09, 2010 9:10 AM

## 2010-06-19 LAB — URINALYSIS, ROUTINE W REFLEX MICROSCOPIC
Bilirubin Urine: NEGATIVE
Bilirubin Urine: NEGATIVE
Glucose, UA: NEGATIVE mg/dL
Glucose, UA: NEGATIVE mg/dL
Hgb urine dipstick: NEGATIVE
Hgb urine dipstick: NEGATIVE
Specific Gravity, Urine: 1.021 (ref 1.005–1.030)
Specific Gravity, Urine: 1.011 (ref 1.005–1.030)
pH: 6.5 (ref 5.0–8.0)
pH: 6.5 (ref 5.0–8.0)

## 2010-06-19 LAB — COMPREHENSIVE METABOLIC PANEL
AST: 147 U/L — ABNORMAL HIGH (ref 0–37)
Albumin: 3.6 g/dL (ref 3.5–5.2)
Alkaline Phosphatase: 99 U/L (ref 39–117)
BUN: 9 mg/dL (ref 6–23)
Chloride: 95 mEq/L — ABNORMAL LOW (ref 96–112)
Creatinine, Ser: 0.73 mg/dL (ref 0.4–1.5)
GFR calc Af Amer: >60 mL/min (ref >60)
Potassium: 4.4 mEq/L (ref 3.5–5.1)
Total Bilirubin: 1.2 mg/dL (ref 0.3–1.2)
Total Protein: 7.8 g/dL (ref 6.0–8.3)

## 2010-06-19 LAB — CBC
Platelets: 98 10*3/uL — ABNORMAL LOW (ref 150–400)
RDW: 15.2 % (ref 11.5–15.5)
WBC: 3.3 10*3/uL — ABNORMAL LOW (ref 4.0–10.5)

## 2010-06-19 LAB — URINE MICROSCOPIC-ADD ON

## 2010-06-19 LAB — DIFFERENTIAL
Basophils Absolute: 0 10*3/uL (ref 0.0–0.1)
Eosinophils Relative: 1 % (ref 0–5)
Lymphocytes Relative: 25 % (ref 12–46)
Monocytes Absolute: 0.4 10*3/uL (ref 0.1–1.0)
Monocytes Relative: 11 % (ref 3–12)
Neutro Abs: 2.1 10*3/uL (ref 1.7–7.7)

## 2010-06-19 LAB — ETHANOL: Alcohol, Ethyl (B): 33 mg/dL — ABNORMAL HIGH (ref 0–10)

## 2010-07-05 LAB — CBC
HCT: 45.6 % (ref 39.0–52.0)
Hemoglobin: 15.6 g/dL (ref 13.0–17.0)
MCHC: 34.2 g/dL (ref 30.0–36.0)
MCV: 96 fL (ref 78.0–100.0)
RBC: 4.75 MIL/uL (ref 4.22–5.81)
WBC: 4.7 10*3/uL (ref 4.0–10.5)

## 2010-07-05 LAB — POCT I-STAT, CHEM 8
Calcium, Ion: 1.1 mmol/L — ABNORMAL LOW (ref 1.12–1.32)
Creatinine, Ser: 0.8 mg/dL (ref 0.4–1.5)
Glucose, Bld: 106 mg/dL — ABNORMAL HIGH (ref 70–99)
Hemoglobin: 16 g/dL (ref 13.0–17.0)
Potassium: 4.3 mEq/L (ref 3.5–5.1)

## 2010-07-05 LAB — DIFFERENTIAL
Basophils Relative: 0 % (ref 0–1)
Eosinophils Absolute: 0.1 10*3/uL (ref 0.0–0.7)
Eosinophils Relative: 3 % (ref 0–5)
Lymphs Abs: 1.2 10*3/uL (ref 0.7–4.0)
Monocytes Absolute: 0.5 10*3/uL (ref 0.1–1.0)
Monocytes Relative: 10 % (ref 3–12)
Neutrophils Relative %: 61 % (ref 43–77)

## 2010-07-15 ENCOUNTER — Ambulatory Visit: Payer: Self-pay | Admitting: Cardiology

## 2010-08-02 ENCOUNTER — Encounter: Payer: Self-pay | Admitting: Cardiology

## 2010-08-03 ENCOUNTER — Encounter: Payer: Self-pay | Admitting: Cardiology

## 2010-08-03 NOTE — Progress Notes (Signed)
HPI: [name] is a gentleman with a history of aortic stenosis as well as alcohol abuse.   We scheduled him to have a Myoview, which was performed on Aug 28, 2007.  There was no ischemia or infarction and his ejection fraction was 65%. Last echocardiogram in Dec of 2011 showed normal LV function and severe aortic stenosis with a mean gradient of 57 mm mercury; trivial AI, mild biatrial enlargement. I last saw him in Dec of 2011. I recommended AVR at that time but patient felt he was not symptomatic and declined. Since then,   Current Outpatient Prescriptions  Medication Sig Dispense Refill  . folic acid (FOLVITE) 1 MG tablet Take 1 mg by mouth daily.        Marland Kitchen omega-3 acid ethyl esters (LOVAZA) 1 G capsule Take 1 g by mouth.        . vitamin B-12 (CYANOCOBALAMIN) 100 MCG tablet Take 50 mcg by mouth daily.           Past Medical History  Diagnosis Date  . Dizziness and giddiness   . Aortic stenosis   . Depression   . GERD (gastroesophageal reflux disease)   . ETOH abuse   . COPD (chronic obstructive pulmonary disease)   . Aortic stenosis     No past surgical history on file.  History   Social History  . Marital Status: Widowed    Spouse Name: N/A    Number of Children: N/A  . Years of Education: N/A   Occupational History  . Not on file.   Social History Main Topics  . Smoking status: Never Smoker   . Smokeless tobacco: Not on file  . Alcohol Use: Yes     Started 1974 (mostly hard liquor)  . Drug Use: Not on file  . Sexually Active: Not on file   Other Topics Concern  . 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 Remaining systems are  negative.  Physical Exam: Well-developed well-nourished in no acute distress.  Skin is warm and dry.  HEENT is normal.  Neck is supple. No thyromegaly.  Chest is clear to auscultation with normal expansion.  Cardiovascular exam is regular rate and rhythm.  Abdominal exam nontender or distended. No masses palpated. Extremities show no edema. neuro grossly intact  ECG     This encounter was created in error - please disregard. This encounter was created in error - please disregard.

## 2010-08-09 ENCOUNTER — Encounter: Payer: Self-pay | Admitting: Cardiology

## 2010-08-16 NOTE — Consult Note (Signed)
Samuel [name], Samuel [name]               ACCOUNT NO.:  [id number]   MEDICAL RECORD NO.:  [id number]          PATIENT TYPE:  REC   LOCATION:  FOOT                         FACILITY:  [facility]   PHYSICIAN:  Theresia Majors. Tanda Rockers, M.D.DATE OF BIRTH:  [birth year]   DATE OF CONSULTATION:  07/25/2007  DATE OF DISCHARGE:                                 CONSULTATION   SUBJECTIVE:  Samuel [name] is a 77 year old man referred by Dr. Lyda Perone from  the [facility] for evaluation of injury to the  right lower extremity.   IMPRESSION:  Second-degree abrasion with fluid retention and stasis.   RECOMMENDATIONS:  Proceed with an external compression wrap with  triamcinolone ointment.  Encouraged the patient to comply with all of  his previous orders per his internist.  We will follow the patient on a  weekly basis until resolution is achieved.   SUBJECTIVE:  Samuel [name] is a 77 year old man who was recently admitted  to [facility] for complaints of near syncope and ethanol abuse.  He has  a longstanding history of valvular heart disease and has been followed  for several years through the clinic.  He was noted to have these  traumatic injuries involving his right lower extremity and was  subsequently discharged from the hospital after stabilization.  His  discharge medications included doxycycline 100 mg p.o. b.i.d.,  multivitamins, and aspirin.  He was instructed to be followed up at  wound center and in the interim, he has been visited by the home health  nurse with a local cleansing of the wound and a dressing change.  There  has been no interim fever.  He continues to be ambulatory.   PAST MEDICAL HISTORY:  Remarkable for depression and anxiety.  He has  history of syncope related to his valvular heart disease.  He also has a  history of foot infection.   PAST SURGICAL HISTORY:  He denies surgery.   ALLERGIES:  He denies allergies.   CURRENT MEDICATIONS:  His medications include;  1.  Over-the-counter multiple vitamins.  2. Aspirin 81 mg daily.  3. Doxycycline 100 mg b.i.d.   FAMILY HISTORY:  His family history is positive for vascular disease.   SOCIAL HISTORY:  Socially, he is separated.  He has stepchildren.  He  lives alone in [place] in an apartment complex.  He is retired from a  Audiological scientist.  He smoked for 3 years in his teens.  He has not smoked  in over 40 years.  He does admit to moderate to excessive alcohol  consumption.  In fact, his recent hospitalization was prompted by a  binge.  He has not been formally treated for alcoholism.  He is retired.   REVIEW OF SYSTEMS:  He has poor exercise tolerance with early fatigue.  He cannot walk 2 blocks and he does not negotiate stairs.  He does admit  to period of faintness and syncope in the past.  He denies chest pain.  He denies hemoptysis, transient paralysis.  His appetite is good.  His  weight is stable.  He denies any  chest pain whatsoever.  His bowel and  bladder function are described as normal.  There are no arthralgias.  The remainder of the review of systems is negative.   PHYSICAL EXAMINATION:  GENERAL:  He is an alert, oriented man.  He is in  no acute distress.  He has a flat affect, but is spontaneous and has a  tendency to ramble somewhat.  VITAL SIGNS:  His blood pressure is 137/74, respirations 16, pulse rate  72, and temperature 97.5.  HEENT:  Exam is clear.  NECK:  Supple.  Trachea is midline.  Thyroid is nonpalpable.  LUNGS:  Clear.  HEART:  The heart sounds are distant with a soft systolic murmur.  ABDOMEN:  Soft.  There is no ascites.  EXTREMITIES:  Warm.  There are bilateral palpable pulses.  There is  bilateral 3+ edema with fresh abrasions on the posteromedial aspect of  the right lower extremity with some weeping.  The patient is insensate  to the Semmes-Weinstein filament bilaterally.  The dorsalis pedis pulses  are 3+ on the left, 2+ on the right.  There is no evidence of  ascending  infection, cellulitis, or induration.   DISCUSSION:  Samuel [name] is a 77 year old man with fluid retention and a  second-degree abrasion.  We will institute compression wrap therapy to  avoid expansion of this wound.  There is no evidence of infection.  We  will see him on a weekly basis in anticipation that this wound should  spontaneously resolve with external compression.      Harold A. Tanda Rockers, M.D.  Electronically Signed     HAN/MEDQ  D:  07/25/2007  T:  07/26/2007  Job:  [id number]   cc:   Manning Charity, MD

## 2010-08-16 NOTE — Discharge Summary (Signed)
NAMEJAXTYN, Samuel [name] NO.:  [id number]   MEDICAL RECORD NO.:  [id number]          PATIENT TYPE:  IPS   LOCATION:  0300                          FACILITY:  [facility]   PHYSICIAN:  Jasmine Pang, M.D. DATE OF BIRTH:  Jun 20, 1926   DATE OF ADMISSION:  03/04/2008  DATE OF DISCHARGE:  03/10/2008                               DISCHARGE SUMMARY   IDENTIFICATION:  This is a 77 year old call widowed white male who was  admitted on a voluntary basis on March 03, 2008.   HISTORY OF PRESENT ILLNESS:  The patient presented initially in the  emergency room where he was brought by emergency services after his  family found him on the floor; he had been incontinent.  Apparently, he  had fallen about 24 hours earlier.  He is quite banged up and bruised  and complaining of pain in his right leg.  He reports that he relapsed  on alcohol shortly after being discharged from [facility] on  January 28, 2008, after 12 days day for alcohol abuse.  He admits that  he struggles quite a bit with alcohol and has been drinking beer and  table wine drinking up to 3 to 4 quarts a day.  His alcohol level in the  emergency room was 331 mg/dL.  He does endorse some grief and sadness  over the recent loss of his wife who had been chronically ill.  She  actually died during the time he was previously admitted here in  October.  He is denying any current suicidal thoughts.   PAST PSYCHIATRIC HISTORY:  This is his third or fourth [facility] admission.  He does have a history of prior admission here about 5 to 6 years ago  also for alcohol detox, and most recent was here on January 16, 2008, to  January 28, 2008, for detox.  He was discharged at that time just on  trazodone to help him sleep at night.  He has a history of using alcohol  since age 26.  He has a history of 2 prior DWIs in 1983 and 1993.  He  does not currently drive because of his level of physical debilitation.  Alcohol was brought to  him by friends and neighbors who get it for him.  He also has a history of 2 prior admissions to [facility] in 1983  and 1993.  He denies any history of suicide attempts.  He denies any  history of other substance abuse.   FAMILY HISTORY:  He has no known history of mental illness or substance  abuse.   MEDICAL PROBLEMS:  Thrombocytopenia; rule out alcohol-induced hepatitis;  contusion, right hip; pain, right leg.   CURRENT MEDICATIONS:  His current medications is trazodone at bedtime as  needed.   DRUG ALLERGIES:  No known drug allergies.   PHYSICAL EXAMINATION:  This was done in the emergency room.  The patient  was a frail-appearing, rather tall gentleman.  There were no acute  physical or medical problems noted.   DIAGNOSTIC STUDIES:  The patient had an alcohol level of 331 mg/dL and  a  urine drug screen positive for benzodiazepines.  The CT of his head was  done in the emergency room and was negative for acute findings, but did  reveal some generalized atrophy in the frontal lobes.  X-ray of his  right knee was negative, done because of some complaints of pain  radiating to the femur.  There was significant bruising over the right  trochanter.   HOSPITAL COURSE:  Upon admission, the patient was started on Ambien 5 mg  p.o. q.h.s. p.r.n. insomnia.  He was also started on Librium detox  protocol.  The patient tolerated his medications well with no  significant side effects.  Upon initially meeting, the patient he was  lying in bed.  He was somewhat less depressed and less anxious on  March 05, 2008, than he was upon admission.  He stated he felt better.  He was less nauseated.  He had started out the hospitalization being  very nauseated.  He was focused on wanting to go home.  He did discuss  the period when his wife was sick.  She died about 4 weeks ago of lung  cancer.  He states he stayed with her in the hospital.  The patient  continued to stay in bed and was  very feeble.  His appetite was poor due  to his heartburn and nausea.  Sleep was remarkable for middle of the  night awakening and early morning awakening.  However, he was becoming  less depressed and less anxious.  His daughter was worried about him  going home.  She wants him to go to an assisted living.  She plans to  come in for family session, discussed this with him.  As hospitalization  progressed, his appetite began to improve.  He continued to be less  depressed, less anxious.  He felt upset about his daughter insisting on  assisted living.  On March 09, 2008, he was put on one-to-one for fall  precautions due to his unsteady gait.  He stated he was willing to go to  assisted living though he is not happy that he cannot go home.  He  realizes he is to weak to manage by himself in the home setting.  On  March 10, 2008, sleep was good, appetite was good.  Mood revealed some  anxiety about transition to assisted living.  Affect was consistent with  mood.  There was no suicidal or homicidal ideation.  No thoughts of self-  injurious behavior.  No auditory or visual hallucinations.  No paranoia  or delusions.  Thoughts were logical and goal-directed, thought content.  No predominant theme.  Cognitive was grossly back to baseline.  Insight  fair, judgment fair.  The patient was felt to be ready for discharge and  he was going to go to assisted living.  His daughter was going to take  him there.  He was given a PPD on the day of discharge for the purposes  of going to assisted living.  He had not had a positive TB test in the  past and never had TB.   DISCHARGE DIAGNOSES:  Axis I:  Alcohol dependence.  Features of  benzodiazepine abuse.  Axis II:  None.  Axis III:  Contusion, right hip; sacral abrasion; thrombocytopenia;  alcohol-induced hepatitis.  Axis IV:  Severe (issues with independent living and social environment,  burden of chemical dependence illness, burden of medical  problems).  Axis V:  Global assessment of functioning was 50 upon  discharge.  GAF  was 32 upon admission.  GAF highest past year was 60 to 65.   DISCHARGE PLANS:  The patient's activity level was restricted by his  need to walk with assistance.  There was no dietary restrictions.   POSTHOSPITAL CARE PLANS:  The patient will go to [facility] on March 10, 2008, at 1 p.m.   DISCHARGE MEDICATION:  Folic acid 1 g daily.      Jasmine Pang, M.D.  Electronically Signed     BHS/MEDQ  D:  03/10/2008  T:  03/11/2008  Job:  [id number]

## 2010-08-16 NOTE — Consult Note (Signed)
NAMEDUSTY, RACZKOWSKI NO.:  [id number]   MEDICAL RECORD NO.:  [id number]          PATIENT TYPE:  INP   LOCATION:  3739                         FACILITY:  [facility]   PHYSICIAN:  Jonelle Sidle, MD DATE OF BIRTH:  January 10, 1927   DATE OF CONSULTATION:  07/19/2007  DATE OF DISCHARGE:                                 CONSULTATION   REQUESTING PHYSICIAN:  Dr. Phillips Odor.   PRIMARY CARE PHYSICIAN:  Dr. Maple Hudson.   CARDIOLOGIST:  Dr. Jens Som.   REASON FOR CONSULTATION:  Symptomatic aortic stenosis.   HISTORY OF PRESENT ILLNESS:  Mr. Febles is a 77 year old male with a  history of aortic valve stenosis followed by Dr. Jens Som and last seen  in the office in May of last year.  At that particular time, he had a  mean transaortic gradient of 28 mmHg and evidence of moderate aortic  valve stenosis.  He has a longstanding history of dyspnea on exertion  and was overall stable at that time.  My understanding is that he  underwent an exercise Myoview also at that point, although I do not have  the records at hand.  He is presently admitted to the hospital with a  right foot cellulitis.  He states that he dropped a butcher knife on his  foot earlier in the week, and it has been swelling and becoming more red  and painful since that time.  He saw his primary care physician, Dr.  Maple Hudson, in the office and was directly admitted.  He is also having some  problems with recurrent alcohol abuse, and this is also to be addressed  during his hospital stay.  In reviewing his symptoms, it was noted that  he has experienced progressive shortness of breath with activity as well  exertional chest pain and a feeling of exertional presyncope although he  had no frank syncope or palpitations.  I see that he had a follow-up  echocardiogram done on April 8 as an outpatient that demonstrated an  ejection fraction of 65% with moderate to severe aortic valve stenosis.  The mean gradient was actually  similar at 23 mmHg, although the  calculated valve area was more consistent with severe stenosis.  This  study report does mention that the aortic valve was not seen well in  cross-section and that perhaps a transesophageal echocardiogram or  cardiac catheterization might be considered for further assessment.  We  are asked to follow the patient for this problem.   ALLERGIES:  No known drug allergies.   MEDICATION LIST:  Is not clear at this point as far as his home  medications.  He has been placed on:  1. Albuterol nebulizer treatments.  2. Thiamine 100 mg p.o. daily.  3. Protonix 40 mg p.o. daily.  4. Lovenox 40 mg subcutaneous daily.  5. Folic acid 1 mg p.o. daily.  6. Lasix 20 mg p.o. daily.  7. Vancomycin per pharmacy protocol dose.  8. Zosyn per pharmacy protocol.   PAST MEDICAL HISTORY:  Is significant for:  1. Progressive aortic valve stenosis.  2. Chronic obstructive pulmonary disease.  3. History of falls with failure to thrive.  4. Medication noncompliance.  5. History of alcohol abuse.  6. History of ataxia.  There is also a history of depression and      anxiety.   SOCIAL HISTORY:  Patient is a former smoker, quit several years ago.  He  has had periods of time without alcohol use, although has started  drinking again over the last several weeks.   REVIEW OF SYSTEMS:  As outlined above.  He has had some weight loss and  fatigue in general.  Occasional palpitations, although no frank syncope,  progressive shortness of breath, NYHA class III exertional chest pain,  exertional presyncope.  He has had some falls with problems with  balance.  Symptoms of anxiety and depression.   FAMILY HISTORY:  Was reviewed and was noncontributory at this point.   PHYSICAL EXAMINATION:  Temperature is 98.1 degrees, heart rate in the  90s, blood pressure 129/75, respirations 22.  This is a chronically ill-appearing male in no acute distress.  HEENT:  Conjunctivae is normal.   Oropharynx clear.  NECK:  Is supple.  No elevated jugular venous pressure.  No loud bruits.  No thyromegaly.  LUNGS:  Are generally clear.  Diminished breath sounds.  CARDIAC EXAM:  Reveals a 3-4/6 systolic murmur heard best at the base  with radiation to the apex, diminished second heart sounds.  No S3  gallop or pericardial rub.  ABDOMEN:  Soft, nontender, normal active bowel sounds.  EXTREMITIES:  Erythema around the right dorsum of the foot.  There is an  old-appearing laceration with purulence, presumably following the knife  injury to his foot.  There is also chronic-appearing venous stasis  changes bilaterally and 1+ edema.  Distal pulses are diminished.  SKIN:  Otherwise warm and dry.  MUSCULOSKELETAL:  There is a scoliosis deformity noted.  NEUROPSYCHIATRIC:  The patient alert and oriented x3.  Affect is normal.   LABORATORY DATA:  Pending at this time.   ELECTROCARDIOGRAM:  Pending at this time.   IMPRESSION:  1. Progressive exertional shortness of breath, chest pain, and      exertional presyncope in a 77 year old male with known aortic valve      stenosis, apparently progressing from moderate to severe based on      recent echocardiogram, although the study was not completely      optimal based on the report.  2. No clearly documented history of coronary disease based on      available information.  The patient may have had an outpatient      exercise Myoview last year, results not available to me at this      time.  3. Chronic obstructive pulmonary disease.  4. History of alcohol abuse.  5. Right lower extremity cellulitis status post injury.   RECOMMENDATIONS:  Will place the patient on aspirin and follow up on  lipid profile.  An electrocardiogram will also be ordered.  It does  sound as if he is developing progressive symptoms consistent wiith  progressive aortic valve stenosis, likely in a range that might be  considered for surgical intervention.  Having said  that, he has other  active issues that need to be addressed first, including his right lower  extremity cellulitis and alcohol detoxification.  A transesophageal  echocardiogram might ultimately be considered to better assess the  valve, and for that matter a cardiac catheterization although not  urgently.  We will follow with you.  Jonelle Sidle, MD  Electronically Signed     SGM/MEDQ  D:  07/19/2007  T:  07/19/2007  Job:  [id number]   cc:   Madolyn Frieze. Jens Som, MD, [facility]

## 2010-08-16 NOTE — Discharge Summary (Signed)
NAMEEMILO, GRAS NO.:  [id number]   MEDICAL RECORD NO.:  [id number]          PATIENT TYPE:  INP   LOCATION:  3739                         FACILITY:  [facility]   PHYSICIAN:  Manning Charity, MD     DATE OF BIRTH:  1926-12-16   DATE OF ADMISSION:  07/19/2007  DATE OF DISCHARGE:  07/23/2007                               DISCHARGE SUMMARY   CONTINUITY DOCTOR:  1. Dr. Jens Som, [facility].  2. Dr. Jonny Ruiz, [facility].   CONSULTANTS:  Cardiology was consulted for evaluation of aortic  stenosis.   DISCHARGE DIAGNOSES:  1. Syncope, likely secondary to alcohol versus worsening aortic valve      stenosis.  2. Right lower extremity cellulitis.  3. Alcohol abuse.  4. Depression and anxiety.  5. Prior history of foot cellulitis.   DISCHARGE MEDICATIONS:  1. Doxycycline 100 mg by mouth twice a day.  2. Multivitamins 1 tab by mouth daily.  3. Aspirin 81 mg by mouth daily.   DISPOSITION ON FOLLOWUP:  Mr. Jaskiewicz has an appointment scheduled with  Dr. Jens Som, [facility], on Aug 21, 2007, 3:15 p.m.  He also  has an appointment scheduled with Dr. Maple Hudson at the Outpatient Clinic, on  Aug 05, 2007, at 2 O' clock, to followup on cellulitis or any recurrence  of syncope episode.  He also has an appointment at the [facility]  on Thursday, Aug 24, 2007, at 1:00 p.m. to followup the cellulitis.   PROCEDURE PERFORMED:  2DECHO:  Overall left ventricular systolic  function was normal. Left ventricular ejection fraction was estimated to  be 65 %. There  were no left ventricular regional wall motion abnormalities. Aortic  valve thickness was markedly increased. Findings were consistent with  moderate to severe aortic valve stenosis. There was trivial aortic  valvular regurgitation. The mean transaortic valve gradient was 23 mmHg.  Estimated aortic valve area (by VTI) was 0.57 cm^2. Estimated aortic  valve area (by Vmax) was 0.59 cm^2. There was mild mitral  annular  calcification. Aortic valve not seen well in cross-section. AS appears  moderate by Doppler (most accurate) and severe by calculated aortic  valve area. Consider cath or TEE, if clinically indicated.   HISTORY OF PRESENT ILLNESS:  Mr. Gatley is a 77 year old male, admitted  from the [facility]patient [facility] with right lower extremity cellulitis.  He  has history of alcohol abuse, on detox.  He related multiple falls and  syncope events.  He also related dyspnea on exertion and dizziness.  He  also noticed swelling of the right lower extremity and edema.   PHYSICAL EXAMINATION:  VITAL SIGNS:  Temperature 98.1, blood pressure  129/75, pulse 97, and respirations 22.  GENERAL:  Ill-appearing man.  EYES:  Sluggish pupils.  EARS, NOSE, AND THROAT:  Edematous and clear.  NECK:  No thyromegaly.  No adenopathy.  RESPIRATION:  Bilateral crackles.  Mild respiratory wheezes.  CARDIOVASCULAR:  3/6 systolic murmur.  S1 normal.  S2 normal.  GASTROINTESTINAL:  Soft and nontender.  Bowel sounds positive.  EXTREMITIES:  Right dorsum forefoot, 2 inch knife wound, and  cellulitis.  Also, with cut and scrape on the skin.  +2 edema.  MUSCLE:  Scoliosis.  Arthritic joints.  NEUROLOGIC:  No focal deficit.  Decreased proprioception on his steady  gait.  PSYCH:  Appropriate.   LABORATORY DATA:  Sodium 139, potassium 3.6, chloride 99, bicarb 30, BUN  9, creatinine 1.0, and glucose 106.  Anion gap 10.  Bilirubin 0.6,  alkaline phosphatase 74, AST 72, ALT 48, protein 8.1, albumin 3.7,  calcium 9.0, magnesium 2.2, and phosphorus 3.4.  White blood cells 5.7,  hemoglobin 16.1, hematocrit 47.1, platelets 201, and ANC 3.2.  BNP 62.  PT 12.8, INR 0.9, and PTT 29.  Cardiac enzymes, troponin 0.02.   HOSPITAL COURSE:  Problems:  1. Syncopal episodes.  Mr. Boeke was admitted to telemetry floor.  He      was monitored for any type of arrhythmia.  Cardiac enzymes were      ordered, which were negative.  Electrolytes  were normal.  BNP 62,      normal.  EKG with normal sinus rhythm.  Cardiology was consulted.      The syncope episode can be secondary to worsening aortic stenosis.      A 2D echo was ordered, which showed overall left ventricular      systolic function was normal.  Left ventricular ejection fraction      was 65%.  Aortic valve thickening was mildly increased.  Finding      was consistent with moderate-to-severe aortic valve stenosis.      There was severe aortic valve regurgitation.  The mean thoracic      valve gradient was 23 mmHg. The estimated aortic valve area was      0.57.  Cardiology decided to evaluate the patient as an outpatient      when the patient had resolution of the cellulitis, when the      patient's alcohol problem will be cleared.  During hospitalization,      Mr. Mahler did not have any syncope episodes.   1. Right lower extremity cellulitis.  He was admitted.  He was started      on broad-spectrum antibiotic IV.  He was started on vancomycin and      Zosyn.  During hospitalization, erythema and swelling improved.  He      was then transferred to p.o. Doxycycline and was responding to its      treatment, also.  Blood cultures, preliminary report shows no      growth.  White blood cells,  during hospitalization were stable,      4.3.   1. Alcohol abuse.  Mr. Rosier has been sober for 5 years, until 4      weeks prior to admission.  He related that he has started drinking      again.  He was then started on CIWA protocol to prevent the      delirium tremens.  During hospitalization, he received a dose of      Ativan because he was having  shaking movement, and he was anxious.      We will send him at home with 1 more week of Ativan, low dose.  He      will need an outpatient alcohol rehab.   DISCHARGE LABS AND VITALS:  On the day of discharge, Mr. Macrae was in  good condition.  Blood pressure 140/84, pulse 67, respirations 18, and  oxygen saturation 96% on room  air, and temperature 98.4.  White blood  cells 4.3, hemoglobin 13.4, hematocrit 39, and platelets 164.  Sodium  139, potassium 3.7, chloride 103, bicarb 29, BUN 9, creatinine 0.9, and  glucose 90.  He was alert and oriented with no tremors noticed.      Hartley Barefoot, MD  Electronically Signed      Manning Charity, MD  Electronically Signed    BR/MEDQ  D:  07/23/2007  T:  07/23/2007  Job:  [id number]

## 2010-08-16 NOTE — Assessment & Plan Note (Signed)
[facility]                            CARDIOLOGY OFFICE NOTE   STOY, FENN                      MRN:          [id number]  DATE:03/30/2008                            DOB:          1926-09-21    [name] is a gentleman who I last saw on Aug 21, 2007.  He has a  history of aortic stenosis as well as alcohol abuse.  His last  echocardiogram was performed in April 2009.  He had normal LV function.  It was interpreted as moderate-to-severe aortic stenosis, but his mean  gradient was 28 mmHg.  The estimated aortic valve area was 0.68 cm2.  When I saw him on Aug 21, 2007, we scheduled him to have a Myoview,  which was performed on Aug 28, 2007.  There was no ischemia or  infarction and his Ejection fraction was 65%.  Since I last saw him, he  has apparently been admitted to [facility] for detoxification,  this is on March 03, 2008.  His liver functions were elevated.  Since  discharge, he continued to have problems with dizziness.  However, he  states this has been going on for years.  This can last for up to 2-3  hours at a time.  He does have a history of question syncope, but this  was felt possibly related to alcohol.  He does have some dyspnea on  exertion, but this also appears to be chronic issue.  There is no  orthopnea, PND, or pedal edema.  He has not had chest pain.   MEDICATIONS:  Aspirin 325 mg p.o. daily.   PHYSICAL EXAMINATION:  VITAL SIGNS:  Blood pressure 130/76.  His pulse  is 70.  HEENT:  Normal.  NECK:  Supple.  CHEST:  Clear.  CARDIOVASCULAR:  Regular rate and rhythm.  There was a 3/6 systolic  murmur at the left sternal border.  S2 is mildly diminished.  ABDOMEN:  No tenderness.  EXTREMITIES:  No edema.   Electrocardiogram shows sinus rhythm at a rate of 70.  There are no ST  changes noted.   DIAGNOSES:  1. Aortic stenosis - Mr. Gloor's symptoms are very difficult to      evaluate as they appear to be  chronic.  Note, he has had what      appears to be syncope in the past, but also has a history of      alcohol abuse and is difficult to sort out which is the causative      problem.  I feel like his aortic stenosis has been moderate in the      past.  We will plan to repeat his echocardiogram.  At some point,      he may need to have an aortic valve replacement.  2. Recent admission for detoxification - We discussed the importance      of avoiding the alcohol.  3. History of cellulitis.   I will see him back in 6 months if his echocardiogram is unchanged.     Madolyn Frieze Jens Som, MD, [facility]  Electronically Signed  BSC/MedQ  DD: 03/30/2008  DT: 03/31/2008  Job #: [id number]

## 2010-08-16 NOTE — Assessment & Plan Note (Signed)
[facility]                            CARDIOLOGY OFFICE NOTE   JERELL, DEMERY                      MRN:          [id number]  DATE:08/21/2007                            DOB:          05-27-1926    Mr. Kerins is a 77 year old gentleman that I last saw on Aug 06, 2006.  That was my initial evaluation.  At that time, he was found to have  aortic stenosis which was felt to be moderate.  We did schedule him to  have a Myoview, but he did not return for this study and cancelled it.  He was recently admitted to [facility] in April.  He had lower  extremity cellulitis.  He also apparently had a history of alcohol abuse  and had, had multiple falls and syncopal episodes, felt secondary to  possible alcohol versus cardiac.  He did rule out for a myocardial  infarction and his BNP was normal.  An echocardiogram showed normal LV  function.  It was interpreted as moderate to severe aortic stenosis.  However, in the setting of normal LV function, his mean gradient was 28  mmHg.  The estimated aortic valve area was 0.68 cm2.  He was seen by Dr.  Diona Browner during that admission and his scheduled follow-up was for  today.  Note, he has dyspnea on exertion which has been a chronic pain  thing.  He can typically do most of his activities unless he does more  extreme activities.  Note, he states he had worked in his garden for 1-  1/2 hours yesterday and had no problems.  He also has had some  dizziness, but this has been a chronic issue for years.  He does not  recall any syncopal episodes.  He has not had chest pain, orthopnea, PND  or pedal edema.   MEDICATIONS:  Aspirin 325 mg p.o. daily.   PHYSICAL EXAMINATION:  VITAL SIGNS:  Today, shows a blood pressure of  137/71.  His pulse is 64.  He weighs 182 pounds.  HEENT:  Normal.  NECK:  Supple.  The upstroke is mildly reduced.  CHEST:  Clear.  CARDIOVASCULAR:  Reveals a regular rate and rhythm.  There is  a 3/6  systolic murmur at the left sternal border.  S2 is diminished.  It does  radiate to the carotids.  ABDOMEN:  Shows no tenderness.  EXTREMITIES:  Show no edema.   His electrocardiogram shows a sinus rhythm at a rate of 62.  There are  no ST changes noted.   DIAGNOSES:  1. Aortic stenosis - in reviewing Mr. Gerald's echocardiogram report,      it is not clear to me that his aortic stenosis is severe.  He has      normal left ventricular function and his mean gradient is 28 mmHg.      He has had chronic dyspnea on exertion which appears to be      relatively unchanged.  Note, he worked in the garden for 1-1/2      hours yesterday with no symptoms.  He also has  chronic dizziness      and his previous syncope may have been related to alcohol.  We have      discussed the options today.  I did suggest a possible right and      left heart catheterization, but he was somewhat hesitant.  Instead,      we have elected to proceed with a Myoview.  If there is no      ischemia, then we will plan a repeat echocardiogram in 6 months to      reassess his aortic stenosis.  If it does show ischemia, then he      will require a right and left catheterization.  He will continue on      his aspirin at the present dose.  Note, when he comes for his      Myoview, we will check lipids and liver and add a statin as      indicated.  Note, he had refused this in the past.  2. History of ethanol abuse.  Management per primary care.  3. Recent cellulitis.   I will see him back in 6 months.     Madolyn Frieze Jens Som, MD, [facility]  Electronically Signed    BSC/MedQ  DD: 08/21/2007  DT: 08/21/2007  Job #: [id number]   cc:   Rufina Falco, M.D.

## 2010-08-16 NOTE — H&P (Signed)
NAMEJABIR, Samuel [name] NO.:  [id number]   MEDICAL RECORD NO.:  [id number]          PATIENT TYPE:  IPS   LOCATION:  0300                          FACILITY:  [facility]   PHYSICIAN:  Jasmine Pang, M.D. DATE OF BIRTH:  January 24, 1927   DATE OF ADMISSION:  03/03/2008  DATE OF DISCHARGE:                       PSYCHIATRIC ADMISSION ASSESSMENT   IDENTIFICATION:  This is a 77 year old Caucasian male who is widowed.  This is a voluntary admission.   HISTORY OF PRESENT ILLNESS:  Patient presented initially in the  emergency room where he was brought by emergency services after family  found him on the floor.  He had been incontinent.  Apparently, he had  fallen about 24 hours earlier.  He was quite banged up and bruised and  complaining of pain in his right leg.  He reports that he relapsed on  alcohol shortly after being discharged from [facility] on October  27 after a 12-day stay for alcohol abuse.  He admits that he struggles  quite a bit with the alcohol, has been drinking beer and table wine,  drinking up to three to four quarts a day.  His alcohol level in the  emergency room is 331 mg/dL.  He does endorse some grief and sadness  over the recent loss of his wife who had been chronically ill, and she  actually died during the time he was previously admitted here in  October.  He is denying any current suicidal thoughts.   PAST PSYCHIATRIC HISTORY:  This is his third or fourth [facility] admission.  He does have a history of a prior admission here about 5 or 6 years ago,  also for alcohol detox, and most recently was here October 15-27, 2009,  for detox and was discharged at that time just on trazodone to help him  sleep at night.  He has a history of using alcohol since age 35.  He has  a history of two prior DWI's in 1983 and 1993.  He does not currently  drive because of his level of physical debilitation.  Alcohol was  brought to him by friends and neighbors who get it  for him.  He also has  a history of two prior admissions to [facility] in [place] and 1993  he denies any history of suicide attempts.  Denies any history of other  substance abuse.   SOCIAL HISTORY:  Previously married.  Wife died recently during his  previous stay in October 2009.  Currently lives in his own home, is  assisted by his daughter who is supportive.  She is a nondrinker.  She  helps him with shopping and groceries.  At his previous discharge, he  was referred to [facility] for assistance with safety  and gait training with a walker.  He was retired from work in 1988 and  prior to that worked 29 years for [employer] here in  [place].  He currently has Social Security income and [employer].  He reports that he has strong faith in God and is affiliated  with the [employer] [employer] here in [place].  No current legal charges.  He has required assistance.  With his activities of daily living in the  home.   FAMILY HISTORY:  No known history of mental illness or substance abuse.   PRIMARY CARE PHYSICIAN:  Dr. Oliver Barre.   MEDICAL PROBLEMS:  1. Thrombocytopenia, rule out alcohol induced hepatitis.  2. Contusion right hip.  3. Pain in right leg.   CURRENT MEDICATIONS:  He reports his pharmacy is [facility] [address], [phone].  Current medications are Trazodone at  bedtime as needed, dose unknown.   DRUG ALLERGIES:  None.   POSITIVE PHYSICAL FINDINGS:  GENERAL:  Physical exam was done in the  emergency room.  This is a frail-appearing, rather tall gentleman.  He  is about 6 feet 1 inch tall, unable to get an accurate weight on him.  He appears thin.  VITAL SIGNS:  On presentation were within normal limits, afebrile.   DIAGNOSTIC STUDIES:  Revealed an alcohol level of 331 mg/dL and urine  drug screen positive for benzodiazepines.   Physical exam revealed significant bruising over the right trochanter  and has  some complaints of pain radiating into the femur with active  range of motion.  CT scan of his head done in the emergency room was  negative for acute findings but did reveal some generalized atrophy in  the frontal lobes x-ray of his right knee was negative.   MENTAL STATUS EXAM:  Fully alert male.  He is oriented x4.  No signs of  delirium or confusion.  Gives a coherent history with normal speech.  Does report that he relapsed fairly quickly after leaving here.  Mood  mildly depressed.  Feels discouraged over his drinking.  Endorsing some  depressed mood.  Denying any suicidal thoughts.  No homicidal thought.  Fully alert, wanting to get up in the wheelchair and go to lunch.  Intake so far has been quite good.  Immediate recent remote memory is  essentially normal.   DIAGNOSES:  AXIS I:  His alcohol abuse rule out dependence, rule out  benzodiazepine abuse.  AXIS II:  Deferred.  AXIS III:  Contusion, right hip and sacrum, sacral abrasion.  Thrombocytopenia.  Alcohol-induced hepatitis.  AXIS IV:  Severe issues with independent living and social environment.  AXIS V:  Current 32, past year not known.   PLAN:  Plan is to voluntarily admit him over the goal of a safe detox in  5 days.  We are going to send him for x-rays of the right hip and the  pelvis.  We will have started him on a Librium detox protocol and he is  enrolled in our dual diagnosis program.  We are going to add 1 mg of  folic acid 100 mg of thiamine daily and multivitamin.  On December 5, we  will recheck his hepatic function, a platelet count.  Will also check a  TSH and free T4.  We are going to ask physical therapy to consult with  [place] on getting him moving again and we have asked his daughter to bring  in his walker.      Margaret A. Lorin Picket, N.P.      Jasmine Pang, M.D.  Electronically Signed    MAS/MEDQ  D:  03/04/2008  T:  03/04/2008  Job:  [id number]

## 2010-08-19 NOTE — Discharge Summary (Signed)
NAME:  Samuel [name], Samuel [name]                         ACCOUNT NO.:  [id number]   MEDICAL RECORD NO.:  [id number]                   PATIENT TYPE:  IPS   LOCATION:  0501                                 FACILITY:  BH   PHYSICIAN:  Hipolito Bayley, M.D.               DATE OF BIRTH:  [birth year]   DATE OF ADMISSION:  02/12/2002  DATE OF DISCHARGE:  02/16/2002                                 DISCHARGE SUMMARY   PATIENT IDENTIFICATION:  The patient is a 77 year old separated white male  who was admitted on involuntary papers to deal with alcohol dependence.  The  patient relapsed three weeks prior to admission after having several years  of sobriety.  He admitted drinking up to a half gallon of wine daily for the  past two weeks.  In the past, he was treated at [facility] for alcohol  dependence in 1993.  He denied any dangerous ideations, denied depression.  Medically, he had a history of hypertension.   INITIAL DIAGNOSTIC IMPRESSION:   AXIS I:  Alcohol dependence.   PHYSICAL EXAMINATION:  GENERAL: The patient's physical examination was  performed at [facility].  Physical examination was normal with  stable vital signs.  Blood pressure 130/76; normal temperature, respiratory  rate, and pulse.   INITIAL LABORATORY DATA:  Alcohol level was 193.  Elevation of SGOT of 46.  Urine drug screen was positive for benzodiazepines.  The patient admitted to  taking Valium.   HOSPITAL COURSE:  After admission to the ward, the patient was placed on  routine observation.  He was started on detoxification protocol with  Librium.   On November 14, the patient was attended by Geoffery Lyons, M.D., who felt that  he was doing well and recommended to continue detoxification protocol.  On  November 15, there were no signs of withdrawal, some unsteadiness of gait.  The patient had some pain of the third toe on the right foot, which seemed  to be red and swollen.  He allegedly had an old injury in  this area.  On  November 16, he denied withdrawal, minimal confusion and some repeating  himself, but alert and oriented x 3, no signs of withdrawal, no signs of  depression, no dangerous ideations.  Decision was made to discharge the  patient to [facility].   Medical problems: As mentioned, the patient complained of some foot pain and  he still seemed to be infected.  He was started on Augmentin 500 mg twice a  day and was supposed to continue Augmentin for the next five days.  He was  also placed on a multivitamin to take one daily.  The patient was supposed  to follow up with his family physician and attend AA as well as check with  [facility] on February 18, 2002, at 10 a.m.   LABORATORY DATA:  Review of laboratory work showed decreased albumin level  3.3, slightly increased AST 46; otherwise was normal.  X-ray showed probably  old avulsion fracture of the medial malleolus, no acute abnormality.   DISCHARGE DIAGNOSES:   AXIS I:  Alcohol dependence.   AXIS II:  Diagnosis deferred.   AXIS III:  No acute medical problems.   AXIS IV:  Problems with primary support group.   AXIS V:  Global assessment of functioning upon admission was 55, for past  year maximum 65, upon discharge 60.                                               Hipolito Bayley, M.D.    JS/MEDQ  D:  03/18/2002  T:  03/19/2002  Job:  [id number]

## 2010-08-19 NOTE — Discharge Summary (Signed)
Samuel [name], Samuel [name]               ACCOUNT NO.:  [id number]   MEDICAL RECORD NO.:  [id number]          PATIENT TYPE:  IPS   LOCATION:  0303                          FACILITY:  BH   PHYSICIAN:  Anselm Jungling, MD  DATE OF BIRTH:  September 09, 1926   DATE OF ADMISSION:  01/16/2008  DATE OF DISCHARGE:  01/28/2008                               DISCHARGE SUMMARY   IDENTIFYING DATA/REASON FOR ADMISSION:  The patient is a 77 year old  Caucasian male who was admitted primarily for alcohol detoxification.  He had been clean and sober for several years, but had relapsed 6 weeks  ago, drinking increasingly heavily.  This appeared to be related to his  separation from his wife of many years, as well as her imminent demise  from cancer.  In fact, the patient's wife did die during the course of  his hospital stay.  Please refer to the admission note for further  details pertaining to the symptoms, circumstances and history that led  to his hospitalization.  He was given an initial Axis I diagnosis of  alcohol dependence.   MEDICAL/LABORATORY:  The patient came to [place] with a history of GERD.  The  patient walked with a cane.  He displayed general physical deterioration  associated with his recent drinking.  He experienced significant alcohol  withdrawal symptoms during his stay despite Librium withdrawal protocol,  which included Librium, thiamine, multivitamin, and folic acid.  Because  of his general level of weakness and debility, physical therapy  consultation was obtained, and they recommended an assisted-living  facility for him as well as regular physical therapy sessions.   HOSPITAL COURSE:  The patient was admitted to the adult inpatient  psychiatric service.  He presented as a friendly, sweet natured  gentleman who was in alcohol withdrawal at the time of admission.  However, there were no signs or symptoms of delirium.  He also  experienced significant depression in relation to the imminent  demise an  eventual death of his wife.  He was allowed to go on pass for a viewing  of his wife's body at the funeral home.  He was tremendously grateful  for having had the opportunity to do so.   His withdrawal was difficult for him, but he was able to get through it  without any medical sequelae or seizures.  His appetite was reasonably  good throughout.  He was given Ensure as he was able to tolerate.   We worked with the patient towards developing a plan, in concurrence  with his family, pertaining to going to an assisted-living facility  following his discharge.   AFTERCARE:  The patient was to follow up at [facility] meetings, as his strength  and mobility would allow.  He was given a list of local meetings.   DISCHARGE MEDICATIONS:  1. Protonix 40 mg daily.  2. Thiamine 100 mg daily.  3. Multivitamin daily.  4. Folic acid 1 mg daily.   The patient was referred to [facility] for physical therapy  and gait training and home safety evaluation.   DISCHARGE DIAGNOSES:  Axis I:  Alcohol dependence, early remission and  bereavement.  Axis II:  Deferred.  Axis III:  Gastroesophageal reflux disease.  Axis IV:  Stressors severe.  Axis V: GAF on discharge 50.      Anselm Jungling, MD  Electronically Signed     SPB/MEDQ  D:  01/29/2008  T:  01/29/2008  Job:  [id number]

## 2010-08-19 NOTE — Assessment & Plan Note (Signed)
HEALTHCARE                            CARDIOLOGY OFFICE NOTE   MEKHI, SONN                      MRN:          [id number]  DATE:08/06/2006                            DOB:          1927/01/31    The patient is a 77 year old male whom I am asked to evaluate for aortic  stenosis.  The patient states that he has known about a murmur for  approximately 30 years.  He recently apparently was seen for question of  appendicitis.  At that time, he was again noted to have a murmur and an  echocardiogram was ordered; this was performed on July 18, 2006.  The  patient's LV function was normal.  There was mild LVH.  The aortic valve  was calcified with reduced cusp excursion.  By Doppler, there appeared  to be moderate aortic stenosis (the mean gradient was 28 mmHg).  There  were no other significant valvular abnormalities noted.  Because of his  aortic stenosis, we were asked to further evaluate.  Note the patient  does have dyspnea on exertion, but this has been a long-term issue for  the last 10-15 years.  It is slightly worse, but he does not feel  significantly so.  There is no significant orthopnea or PND and there is  no pedal edema.  He does not have chest pain.  He has a history of  chronic dizziness; however, this can last for extended amounts of time.  There is no history of syncope.   MEDICATIONS:  None.   ALLERGIES:  He has no known drug allergies.   SOCIAL HISTORY:  He has a remote history of tobacco use, but has not  smoked in years.  He does not consume alcohol and has not for 4 years.   FAMILY HISTORY:  Positive for coronary artery disease.   PAST MEDICAL HISTORY:  There is no diabetes mellitus, hypertension or  hyperlipidemia.  There is a question of COPD.  He has had no previous  surgeries.   REVIEW OF SYSTEMS:  He denies any headaches or fever, or chills.  There  is no hemoptysis, but he does occasionally have a cough in the  mornings.  There is dysphagia, odynophagia, melena or hematochezia.  There is no  dysuria or hematuria.  There is no rash or seizure activity.  There is  no orthopnea, PND or pedal edema.  The remaining systems are negative.  He does have some pain in his right lower quadrant at times that has  been present and he has been evaluated for possible appendicitis by his  report.   PHYSICAL EXAMINATION:  Blood pressure of 120/80 and the pulse is 64.  He  weighs 176 pounds.  He is well-developed and well-nourished, in no acute distress.  His skin  is warm and dry.  He does not appear to be depressed and there is no  peripheral clubbing.  There is mild scoliosis of his back.  HEENT:  Remarkable for edentulous, but is otherwise normal.  His eyelids  are normal.  NECK:  Supple.  His carotid upstroke  is minimally diminished.  There is  no jugulovenous distention and I cannot appreciate thyromegaly.  CHEST:  Shows mildly diminished breath sounds throughout, but is  otherwise clear to auscultation with normal expansion.  CARDIOVASCULAR:  Exam reveals a regular rate and rhythm.  There is a 3/6  systolic murmur at the left sternal border that radiates to the  carotids.  S2 is mildly diminished.  There is no S3 or S4.  I cannot  palpate his PMI.  ABDOMEN:  Not tender or distended.  Positive bowel sounds.  No  hepatosplenomegaly and no mass appreciated.  There is a negative Romberg.  He has 2+ femoral pulses bilaterally and  no bruits.  EXTREMITIES:  Show no edema and I can palpate no cords.  He has 2+  dorsalis pedis pulses bilaterally.  NEUROLOGICAL:  Exam is grossly intact.   ELECTROCARDIOGRAM:  Shows a sinus rhythm at a rate of 64.  There are no  ST changes noted.   DIAGNOSES:  Moderate aortic stenosis -- it is not clear to me that the  patient is having significant symptoms from this.  He does have dyspnea  on exertion, but this apparently has been present for 10-15 years and is   essentially unchanged.  He also has some dizziness that does not sound  cardiac and he has had no history of syncope and there is no chest pain.  We will plan to proceed with a stress Myoview for risk stratification.  If he does have significant coronary disease, then this would push me to  consider valve replacement surgery earlier.  If his perfusion study is  normal, then we will plan to repeat his echocardiogram in 6 months and  on a serial basis.  We will follow him for any worsening dyspnea, chest  pain or syncope.  If he did indeed demonstrate this, then he would need  valve replacement.  I have asked him to begin enteric-coated aspirin 81  mg p.o. daily.  I would also like for him to begin a statin, but he has  declined this.  Instead, he will consider chelation therapy by his  report, although I made it clear this was not my recommendation.  We  will see him back in 6 months.     Madolyn Frieze Jens Som, MD, [facility]  Electronically Signed    BSC/MedQ  DD: 08/06/2006  DT: 08/07/2006  Job #: [id number]   cc:   [name], M.D.

## 2010-08-19 NOTE — H&P (Signed)
NAME:  Samuel [name], Samuel [name]                         ACCOUNT NO.:  [id number]   MEDICAL RECORD NO.:  [id number]                   PATIENT TYPE:  IPS   LOCATION:  0501                                 FACILITY:  [facility]   PHYSICIAN:  Geoffery Lyons, MD                     DATE OF BIRTH:  April 21, 1926   DATE OF ADMISSION:  02/12/2002  DATE OF DISCHARGE:  02/16/2002                         PSYCHIATRIC ADMISSION ASSESSMENT   IDENTIFYING INFORMATION:  This is a 77 year old separated white male  voluntarily admitted on February 12, 2002.   HISTORY OF PRESENT ILLNESS:  The patient presents with a history of alcohol  abuse.  He has been drinking a half a gallon of wine daily.  He felt he  needed help.  He has been becoming very weak.  He has had several falls.  He  called 911 after not feeling well, stating that he needed help to stop  drinking.  He has a long history of alcohol abuse.  He states he stopped for  years then relapsed about three weeks ago.  He denies any depression,  anxiety, suicidal or homicidal ideation.  He reports withdrawal symptoms,  feeling very jittery.  Denies any significant stressors.  He reports he  had a fall while hiking two weeks ago and complained of pain to his ankle.   PAST PSYCHIATRIC HISTORY:  This is the second hospitalization to [facility].  He was here in 1993 for alcohol detox.  His longest history  of sobriety has been one year.  States he has been sober for years.  No past  psychiatric history.   SOCIAL HISTORY:  This is a 77 year old separated white male.  He has two  children of his own.  He lives alone.  He is retired.  He used to work at  [employer].  He has two DUIs in the past.   FAMILY HISTORY:  Unclear.   ALCOHOL/DRUG HISTORY:  Nonsmoker.  He has been drinking beer and wine for  the past three weeks.  Last drink was on day prior to admission.  No drug  use.   PRIMARY CARE PHYSICIAN:  Dr. Ellene Route in [place].   MEDICAL PROBLEMS:  None.   MEDICATIONS:  The patient has been on Valium, taking up to two per day for a  week to calm his nerves.   ALLERGIES:  No known allergies.   PHYSICAL EXAMINATION:  Performed at [facility].  The  patient appears somewhat chronically ill.  Thin.  The patient has an  unsteady gait.  His left middle toe is reddened with a scabbed over area on  the dorsum of his toe.  Also appears very unkempt.   LABORATORY DATA:  Alcohol level was 193 on admission.  Urinalysis was  negative.  CBC was within normal limits.  SGOT is elevated at 46.  Albumin  3.3.  Urine  drug screen was positive for benzodiazepines.   MENTAL STATUS EXAM:  He is an alert, disheveled, elderly male dressed in  hospital gown.  He has an unsteady gait.  His eyes are closed while talking.  Speech is clear.  Mood, he feels very guilty.  Affect is appropriate to  mood.  Thought processes are coherent.  There is no psychosis.  No auditory  or visual hallucinations.  No suicidal or homicidal ideation.  Cognitive  function is intact.  Memory is fair.  Judgment and insight are fair.  Poor  impulse control.   DIAGNOSES:   AXIS I:  Alcohol dependence.   AXIS II:  Deferred.   AXIS III:  Questionable history of hypothyroidism after reviewing records.   AXIS IV:  Problems with other psychosocial problems.   AXIS V:  Current 55; estimated this past year 51.   PLAN:  Voluntary admission to [facility] for alcohol detox.  Contract for safety.  Check every 15 minutes.  Will initiate the low dose  Librium protocol to detox safely.  The patient considered to be a fall risk.  Will x-ray patient's left foot and ankle to rule out a fracture.  Initiate  antibiotics for patient's redness and open area to toe.  Encourage fluids.  The patient was instructed on fall precautions.  The patient to remain  alcohol-free and attend AA meetings after discharge.   TENTATIVE LENGTH OF STAY:   Four to five days.      Landry Corporal, N.P.                       Geoffery Lyons, MD    JO/MEDQ  D:  02/16/2002  T:  02/16/2002  Job:  [id number]

## 2010-09-29 ENCOUNTER — Ambulatory Visit: Payer: Self-pay | Admitting: Cardiology

## 2010-10-24 ENCOUNTER — Ambulatory Visit: Payer: Self-pay | Admitting: Cardiology

## 2010-10-27 ENCOUNTER — Encounter: Payer: Self-pay | Admitting: Cardiology

## 2010-11-01 ENCOUNTER — Encounter: Payer: Self-pay | Admitting: Cardiology

## 2010-11-01 NOTE — Progress Notes (Signed)
HPI: Samuel [name] is a gentleman with a history of aortic stenosis as well as alcohol abuse. We scheduled him to have a Myoview, which was performed on Aug 28, 2007. There was no ischemia or infarction and his ejection fraction was 65%. Last echocardiogram in Dec of 2011 showed normal LV function and severe aortic stenosis with a mean gradient of 57 mm mercury; trivial AI, mild biatrial enlargement. I last saw him in Dec of 2011. Since then he does note dyspnea on exertion relieved with rest. This occurs with more moderate activities but not if he keeps his pace relatively slow. No orthopnea, PND, pedal edema, chest pain or syncope.   Current Outpatient Prescriptions  Medication Sig Dispense Refill  . folic acid (FOLVITE) 1 MG tablet Take 1 mg by mouth daily.        Marland Kitchen omega-3 acid ethyl esters (LOVAZA) 1 G capsule Take 1 g by mouth.        . vitamin B-12 (CYANOCOBALAMIN) 100 MCG tablet Take 50 mcg by mouth daily.           Past Medical History  Diagnosis Date  . Dizziness and giddiness   . Aortic stenosis   . Depression   . GERD (gastroesophageal reflux disease)   . ETOH abuse   . COPD (chronic obstructive pulmonary disease)   . Aortic stenosis     No past surgical history on file.  History   Social History  . Marital Status: Widowed    Spouse Name: N/A    Number of Children: N/A  . Years of Education: N/A   Occupational History  . Not on file.   Social History Main Topics  . Smoking status: Never Smoker   . Smokeless tobacco: Not on file  . Alcohol Use: Yes     Started 1974 (mostly hard liquor)  . Drug Use: Not on file  . Sexually Active: Not on file   Other Topics Concern  . 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 Remaining systems are negative.  Physical Exam: Well-developed well-nourished in no acute distress.  Skin is warm and dry.  HEENT is normal.  Neck is supple. No thyromegaly.  Chest is clear to auscultation with normal expansion.  Cardiovascular exam is regular rate and rhythm.  Abdominal exam nontender or distended. No masses palpated. Extremities show no edema. neuro grossly intact  ECG     This encounter was created in error - please disregard.

## 2010-11-08 ENCOUNTER — Encounter: Payer: Self-pay | Admitting: Cardiology

## 2010-12-13 ENCOUNTER — Emergency Department (HOSPITAL_COMMUNITY): Payer: Medicare Other

## 2010-12-13 ENCOUNTER — Inpatient Hospital Stay (HOSPITAL_COMMUNITY)
Admission: EM | Admit: 2010-12-13 | Discharge: 2010-12-16 | DRG: 897 | Disposition: A | Discharge status: Skilled Nursing Facility | Payer: Medicare Other | Attending: Internal Medicine | Admitting: Internal Medicine

## 2010-12-13 DIAGNOSIS — M4120 Other idiopathic scoliosis, site unspecified: Secondary | ICD-10-CM | POA: Diagnosis present

## 2010-12-13 DIAGNOSIS — I359 Nonrheumatic aortic valve disorder, unspecified: Secondary | ICD-10-CM | POA: Diagnosis present

## 2010-12-13 DIAGNOSIS — H269 Unspecified cataract: Secondary | ICD-10-CM | POA: Diagnosis present

## 2010-12-13 DIAGNOSIS — J449 Chronic obstructive pulmonary disease, unspecified: Secondary | ICD-10-CM | POA: Diagnosis present

## 2010-12-13 DIAGNOSIS — L89309 Pressure ulcer of unspecified buttock, unspecified stage: Secondary | ICD-10-CM | POA: Diagnosis present

## 2010-12-13 DIAGNOSIS — R627 Adult failure to thrive: Secondary | ICD-10-CM | POA: Diagnosis present

## 2010-12-13 DIAGNOSIS — R5381 Other malaise: Secondary | ICD-10-CM | POA: Diagnosis present

## 2010-12-13 DIAGNOSIS — J4489 Other specified chronic obstructive pulmonary disease: Secondary | ICD-10-CM | POA: Diagnosis present

## 2010-12-13 DIAGNOSIS — F10239 Alcohol dependence with withdrawal, unspecified: Secondary | ICD-10-CM | POA: Diagnosis present

## 2010-12-13 DIAGNOSIS — R12 Heartburn: Secondary | ICD-10-CM | POA: Diagnosis present

## 2010-12-13 DIAGNOSIS — I10 Essential (primary) hypertension: Secondary | ICD-10-CM | POA: Diagnosis present

## 2010-12-13 DIAGNOSIS — F10939 Alcohol use, unspecified with withdrawal, unspecified: Secondary | ICD-10-CM | POA: Diagnosis present

## 2010-12-13 DIAGNOSIS — I519 Heart disease, unspecified: Secondary | ICD-10-CM | POA: Diagnosis present

## 2010-12-13 DIAGNOSIS — L8990 Pressure ulcer of unspecified site, unspecified stage: Secondary | ICD-10-CM | POA: Diagnosis present

## 2010-12-13 DIAGNOSIS — F10229 Alcohol dependence with intoxication, unspecified: Principal | ICD-10-CM | POA: Diagnosis present

## 2010-12-13 DIAGNOSIS — Z87891 Personal history of nicotine dependence: Secondary | ICD-10-CM

## 2010-12-13 DIAGNOSIS — B029 Zoster without complications: Secondary | ICD-10-CM | POA: Diagnosis present

## 2010-12-13 DIAGNOSIS — E46 Unspecified protein-calorie malnutrition: Secondary | ICD-10-CM | POA: Diagnosis present

## 2010-12-13 LAB — COMPREHENSIVE METABOLIC PANEL
Alkaline Phosphatase: 147 U/L — ABNORMAL HIGH (ref 39–117)
BUN: 6 mg/dL (ref 6–23)
Calcium: 8.4 mg/dL (ref 8.4–10.5)
GFR calc Af Amer: >60 mL/min (ref >60)
Glucose, Bld: 102 mg/dL — ABNORMAL HIGH (ref 70–99)
Potassium: 4.2 mEq/L (ref 3.5–5.1)
Total Protein: 8 g/dL (ref 6.0–8.3)

## 2010-12-13 LAB — DIFFERENTIAL
Basophils Relative: 1 % (ref 0–1)
Eosinophils Relative: 1 % (ref 0–5)
Lymphocytes Relative: 26 % (ref 12–46)
Monocytes Absolute: 0.8 10*3/uL (ref 0.1–1.0)
Monocytes Relative: 13 % — ABNORMAL HIGH (ref 3–12)
Neutro Abs: 3.7 10*3/uL (ref 1.7–7.7)

## 2010-12-13 LAB — CBC
HCT: 47.1 % (ref 39.0–52.0)
Hemoglobin: 16.6 g/dL (ref 13.0–17.0)
MCH: 33.2 pg (ref 26.0–34.0)
MCHC: 35.2 g/dL (ref 30.0–36.0)
MCV: 94.2 fL (ref 78.0–100.0)

## 2010-12-13 LAB — URINALYSIS, ROUTINE W REFLEX MICROSCOPIC
Bilirubin Urine: NEGATIVE
Hgb urine dipstick: NEGATIVE
Ketones, ur: NEGATIVE mg/dL
Specific Gravity, Urine: 1.009 (ref 1.005–1.030)
Urobilinogen, UA: 0.2 mg/dL (ref 0.0–1.0)

## 2010-12-13 LAB — CARDIAC PANEL(CRET KIN+CKTOT+MB+TROPI)
CK, MB: 3.3 ng/mL (ref 0.3–4.0)
Relative Index: INVALID (ref 0.0–2.5)
Total CK: 77 U/L (ref 7–232)
Troponin I: <0.3 ng/mL (ref <0.30)

## 2010-12-13 LAB — PRO B NATRIURETIC PEPTIDE: Pro B Natriuretic peptide (BNP): 576.5 pg/mL — ABNORMAL HIGH (ref 0–450)

## 2010-12-13 LAB — ETHANOL: Alcohol, Ethyl (B): 260 mg/dL — ABNORMAL HIGH (ref 0–11)

## 2010-12-14 LAB — CBC
HCT: 42.8 % (ref 39.0–52.0)
Hemoglobin: 14.5 g/dL (ref 13.0–17.0)
MCH: 32.3 pg (ref 26.0–34.0)
MCHC: 33.9 g/dL (ref 30.0–36.0)
MCV: 95.3 fL (ref 78.0–100.0)
Platelets: 102 10*3/uL — ABNORMAL LOW (ref 150–400)
RBC: 4.49 MIL/uL (ref 4.22–5.81)
RDW: 13.9 % (ref 11.5–15.5)
WBC: 4.4 10*3/uL (ref 4.0–10.5)

## 2010-12-14 LAB — BASIC METABOLIC PANEL
BUN: 6 mg/dL (ref 6–23)
CO2: 31 mEq/L (ref 19–32)
Calcium: 7.8 mg/dL — ABNORMAL LOW (ref 8.4–10.5)
Chloride: 100 mEq/L (ref 96–112)
Creatinine, Ser: 0.68 mg/dL (ref 0.50–1.35)
GFR calc Af Amer: >60 mL/min (ref >60)
GFR calc non Af Amer: >60 mL/min (ref >60)
Glucose, Bld: 128 mg/dL — ABNORMAL HIGH (ref 70–99)
Potassium: 4.3 mEq/L (ref 3.5–5.1)
Sodium: 137 mEq/L (ref 135–145)

## 2010-12-14 LAB — TROPONIN I: Troponin I: <0.3 ng/mL (ref <0.30)

## 2010-12-14 LAB — PRO B NATRIURETIC PEPTIDE: Pro B Natriuretic peptide (BNP): 1061 pg/mL — ABNORMAL HIGH (ref 0–450)

## 2010-12-14 LAB — MAGNESIUM: Magnesium: 2 mg/dL (ref 1.5–2.5)

## 2010-12-14 LAB — TSH: TSH: 2.676 u[IU]/mL (ref 0.350–4.500)

## 2010-12-14 LAB — PHOSPHORUS: Phosphorus: 2.6 mg/dL (ref 2.3–4.6)

## 2010-12-14 LAB — OCCULT BLOOD X 1 CARD TO LAB, STOOL: Fecal Occult Bld: NEGATIVE

## 2010-12-15 LAB — BASIC METABOLIC PANEL
BUN: 7 mg/dL (ref 6–23)
CO2: 31 mEq/L (ref 19–32)
Calcium: 8.1 mg/dL — ABNORMAL LOW (ref 8.4–10.5)
Chloride: 96 mEq/L (ref 96–112)
Creatinine, Ser: 0.71 mg/dL (ref 0.50–1.35)
Glucose, Bld: 105 mg/dL — ABNORMAL HIGH (ref 70–99)

## 2010-12-16 NOTE — Discharge Summary (Signed)
NAMEMARSEL, GAIL NO.:  [id number]  MEDICAL RECORD NO.:  [id number]  LOCATION:  1401                         FACILITY:  [facility]  PHYSICIAN:  Zannie Cove, MD     DATE OF BIRTH:  12-05-1926  DATE OF ADMISSION:  12/13/2010 DATE OF DISCHARGE:  12/16/2010                         DISCHARGE SUMMARY-REFERRING   PRIMARY CARE PHYSICIAN:  None.  His unassigned cardiologist is Dr. Olga Millers with [facility].  DISCHARGE DIAGNOSES: 1. Alcohol abuse with alcohol withdrawal, improved. 2. Moderate aortic stenosis. 3. Physical deconditioning. 4. Chronic obstructive pulmonary disease. 5. Hypertension. 6. Right buttock/hip sacral decubitus ulcer. 7. History of depression.  DISCHARGE MEDICATIONS:  As follows: 1. Amlodipine 10 mg daily. 2. Omeprazole 20 mg daily. 3. Thiamine 100 mg daily. 4. Xanax 0.5 mg p.o. t.i.d. p.r.n. 5. Aspirin 81 mg daily.  DIAGNOSTIC INVESTIGATIONS: 1. Chest x-ray, 9/11 showed no acute cardiopulmonary disease, severe     dextroconvex thoracic scoliosis. 2. CT of the head showed no acute intracranial abnormality, mild     progressive cerebral atrophy and small vessel ischemic change.  HOSPITAL COURSE:  Mr. Rodenberg is an 77 year old gentleman with history of alcohol abuse, was found by his home health nurses to be sitting in his bed, unable to get up, and had not had anything to eat and with decreased ability to ambulate and living in an unsanitary condition.  He was brought to the hospital after EMS was called with home.  On evaluation, his electrolytes as well as blood work was unremarkable.  He was found to have a small sacral decubitus ulcer, otherwise mild signs of dehydration and alcohol withdrawal. 1. For his alcohol withdrawal, he was treated with zero protocol with     Ativan with fluids, as well as thiamine.  He is improved.  He is     being discharged on low-dose Xanax t.i.d., p.r.n. for a few days,     which can slowly  be weaned off. 2. Hypertension.  He did have episodes of uncontrolled hypertension in     the initial part of his hospital stay, felt to be secondary to     alcohol withdrawal, which has since improved, stable on amlodipine     and will continue this at discharge. 3. Aortic stenosis, stable.  Followup with Cardiology. 4. Chronic obstructive pulmonary disease is stable as well.  He did     not require any nebulizers. 5. Adult failure to thrive with malnutrition, unable to care for     himself, as well as physical deconditioning.  He was seen by     physical therapy, recommended skilled nursing facility for     rehabilitation and hence he is being discharged to rehab for the     same.  Discharge condition is stable.  Vital signs, temperature is     97.7, pulse 88, blood pressure 134/88, respirations 18, sating 92%     on room air.  Discharge followup with primary physician at the     skilled nursing facility in the next 2-3 days and Dr. Jens Som in 1-     2 months.     Zannie Cove, MD     PJ/MEDQ  D:  12/16/2010  T:  12/16/2010  Job:  [id number]  cc:   Ralene Ok, M.D. Fax: [phone]

## 2010-12-27 LAB — POCT I-STAT, CHEM 8
BUN: 7
Calcium, Ion: 1.08 — ABNORMAL LOW
Chloride: 101
Creatinine, Ser: 1.1
Glucose, Bld: 87
TCO2: 30

## 2010-12-27 LAB — ETHANOL: Alcohol, Ethyl (B): 9

## 2010-12-27 LAB — CARDIAC PANEL(CRET KIN+CKTOT+MB+TROPI)
CK, MB: 5.9 — ABNORMAL HIGH
CK, MB: 6.1 — ABNORMAL HIGH
Total CK: 62
Total CK: 86
Troponin I: 0.02
Troponin I: 0.02

## 2010-12-27 LAB — BASIC METABOLIC PANEL
BUN: 7
BUN: 9
Chloride: 103
Creatinine, Ser: 0.93
GFR calc non Af Amer: >60
GFR calc non Af Amer: >60
Glucose, Bld: 90
Potassium: 3.7
Potassium: 4.1
Sodium: 142

## 2010-12-27 LAB — DIFFERENTIAL
Eosinophils Absolute: 0.3
Eosinophils Relative: 5
Eosinophils Relative: 7 — ABNORMAL HIGH
Lymphocytes Relative: 27
Lymphs Abs: 1.3
Lymphs Abs: 1.5
Monocytes Absolute: 0.6
Monocytes Absolute: 0.7
Neutro Abs: 3.2

## 2010-12-27 LAB — MAGNESIUM: Magnesium: 1.9

## 2010-12-27 LAB — RAPID URINE DRUG SCREEN, HOSP PERFORMED
Amphetamines: NOT DETECTED
Barbiturates: NOT DETECTED
Benzodiazepines: POSITIVE — AB
Tetrahydrocannabinol: NOT DETECTED

## 2010-12-27 LAB — COMPREHENSIVE METABOLIC PANEL
ALT: 48
Albumin: 3.7
Alkaline Phosphatase: 74
Potassium: 3.6
Sodium: 139
Total Protein: 8.1

## 2010-12-27 LAB — CBC
HCT: 38.9 — ABNORMAL LOW
HCT: 39
Hemoglobin: 13.4
Hemoglobin: 13.6
Hemoglobin: 16.1
MCHC: 34.3
MCV: 95.5
Platelets: 142 — ABNORMAL LOW
Platelets: 157
Platelets: 164
Platelets: 201
RBC: 4.61
RDW: 13.8
WBC: 4.3
WBC: 4.3
WBC: 4.6
WBC: 5.7

## 2010-12-27 LAB — CULTURE, BLOOD (ROUTINE X 2): Culture: NO GROWTH

## 2010-12-27 LAB — B-NATRIURETIC PEPTIDE (CONVERTED LAB): Pro B Natriuretic peptide (BNP): 62

## 2010-12-27 LAB — HEPATIC FUNCTION PANEL
AST: 181 — ABNORMAL HIGH
Albumin: 3.4 — ABNORMAL LOW
Alkaline Phosphatase: 70
Total Protein: 6.9

## 2010-12-27 LAB — URINALYSIS, ROUTINE W REFLEX MICROSCOPIC
Bilirubin Urine: NEGATIVE
Hgb urine dipstick: NEGATIVE
Ketones, ur: NEGATIVE
Specific Gravity, Urine: 1.01

## 2010-12-27 LAB — TSH: TSH: 2.086

## 2010-12-27 LAB — PROTIME-INR
INR: 0.9
Prothrombin Time: 12.8

## 2010-12-27 LAB — APTT: aPTT: 29

## 2010-12-27 LAB — VITAMIN B12: Vitamin B-12: 419 (ref 211–911)

## 2011-01-02 LAB — CBC
HCT: 44
Hemoglobin: 14.9
MCHC: 33.9
MCV: 98.4
RDW: 13.5

## 2011-01-02 LAB — TROPONIN I: Troponin I: 0.03

## 2011-01-02 LAB — POCT I-STAT, CHEM 8
Calcium, Ion: 0.96 — ABNORMAL LOW
Creatinine, Ser: 1
Glucose, Bld: 130 — ABNORMAL HIGH
HCT: 52
Hemoglobin: 17.7 — ABNORMAL HIGH
Potassium: 4.4
TCO2: 26

## 2011-01-02 LAB — HEPATIC FUNCTION PANEL
ALT: 67 — ABNORMAL HIGH
AST: 75 — ABNORMAL HIGH
Alkaline Phosphatase: 80
Bilirubin, Direct: 0.4 — ABNORMAL HIGH
Indirect Bilirubin: 0.7
Total Bilirubin: 1.1

## 2011-01-02 LAB — ETHANOL: Alcohol, Ethyl (B): 174 — ABNORMAL HIGH

## 2011-01-02 LAB — RAPID URINE DRUG SCREEN, HOSP PERFORMED
Cocaine: NOT DETECTED
Tetrahydrocannabinol: NOT DETECTED

## 2011-01-03 NOTE — H&P (Signed)
Samuel [name], Samuel [name]               ACCOUNT NO.:  [id number]  MEDICAL RECORD NO.:  [id number]  LOCATION:  1401                         FACILITY[facility]  [facility]  PHYSICIAN:  Thomasenia Bottoms, MDDATE OF BIRTH:  [birth year]  DATE OF ADMISSION:  12/13/2010 DATE OF DISCHARGE:                             HISTORY & PHYSICAL   CHIEF COMPLAINT:  Unable to walk.  HISTORY OF PRESENTING ILLNESS:  Samuel [name] is an 77 year old gentleman who was sent in by EMS when home nurses found him at home sitting in feces, unable to get up, had not had anything to eat.  The patient says he has not been able to walk for about a day.  He was in unsanitary living conditions.  He is a known alcoholic.  He is somewhat vague on details about his poor living condition.  The patient says he is at the hospital here to get help with his drinking.  He also reports that he has had some trouble with increased shortness of breath, though denies any chest pain.  PAST MEDICAL HISTORY:  Significant for moderate aortic stenosis based on an echocardiogram done in December 2011.  He has diastolic dysfunction and normal ejection fraction of 55%.  No wall motion abnormalities, other than the moderate aortic stenosis.  He does have a history of shingles, scoliosis, cataracts and COPD and of course a longstanding alcohol abuse.  SOCIAL HISTORY:  He says he quit smoking in 1954.  He drinks heavily and has for some time.  He was last admitted to detox here in January 2011. No illicit drug use.  The patient lives alone.  FAMILY HISTORY:  The patient tells me his sister is his only living sibling.  His father died of an MI at age 26.  Brother died of an MI at age 96 and another brother died of MI at age 84.  REVIEW OF SYSTEMS:  The patient says he has lots of trouble with heartburn and he just takes Alka-Seltzer for this.  He drinks daily.  He has been having some trouble with increased shortness of breath over the last couple of  weeks making it very difficult for him to get around.  He sometimes has trouble with swelling in his ankles, so none at this time. Does not appear to have significant trouble with orthopnea at this time, heartburn is because consistent problem.  He has occasional diarrhea. He does not have trouble with constipation.  No fevers or chills and he has an ulcer on his backside on the right.  He says that has been there for a couple of days.  The patient is not a very forthcoming historian. All other systems reviewed and are negative.  PHYSICAL EXAMINATION:  VITAL SIGNS:  In the emergency department on arrival, his blood pressure was 114/73, respiratory rate 18, pulse 98, temperature 98.0 orally. GENERAL:  The patient is disheveled and malnourished appearing. HEENT:  Right eye which does not seem to open all the way, otherwise he is atraumatic, normocephalic.  His pupils are round, clouded with cataracts.  Sclera nonicteric.  Oral mucosa dry. NECK:  Supple.  No lymphadenopathy.  No thyromegaly.  He does have a murmur from  his aortic stenosis radiates into his neck and he does have elevated jugular venous pressures. CARDIAC:  Regular rate and rhythm.  His systolic murmur is slightly muffled. LUNGS:  Clear to auscultation bilaterally.  No wheezes, rhonchi, or rales.  He has clear and excellent breath sounds. ABDOMEN:  Soft, nontender, and nondistended.  Normoactive bowel sounds. No masses were appreciated.  I did not appreciate any hepatosplenomegaly.  No rebound or guarding. EXTREMITIES:  No evidence of clubbing, cyanosis, or pitting edema.  His DP pulses are faintly palpable laterally. SKIN:  Warm, dry and mostly intact.  He does have a decubitus ulcer on his right buttocks.  It actually looks more like a bruise.  There is a large bruised area as if he had trauma.  The bruised area is about 10 cm long.  In the middle, there is circular area of redness and a very small area of what looks  like dried exudate in the middle.  That area is just may be 1 or 2 mm long in oblong area in the middle of all of the bruising and redness.  No clear area of infection.  The redness looks to be pressure related.  No foul odor.  Certainly, it is sore to touch. NEUROLOGICAL:  The patient is awake and alert.  He is attentive and appropriate.  Normal affect.  Oriented x3.  Cranial nerves II through XII are intact grossly.  He seems unable to open his eye on the right all the way up, but his extraocular muscles are intact.  He has no slurred speech.  He has 5/5 strength in his upper and lower extremities, though he has some generalized weakness.  No focal motor asymmetry. Sensory examination is grossly intact to light touch.  No asterixis. MUSCULOSKELETAL:  Fairly good range of motion.  He has diminished muscle bulk, but normal muscle tone.  DATA:  The patient's urinalysis was clear with negative nitrate, negative proteins, fairly normal urinalysis.  White count is 6.1, hemoglobin 16.6, hematocrit 47, MCV 94, platelet count 130.  The patient had a chest x-ray, which revealed no acute cardiopulmonary disease, unchanged severe dextroconvex thoracic scoliosis compared to a chest x- ray in April 2009.  Alcohol level was 260.  Sodium 135, potassium 4.2, chloride 96, bicarb 27, glucose 102, BUN 6, creatinine 0.75.  AST 146, ALT 63, alk phos 147, total bili 0.9.  The patient also had a CT scan of his head, which revealed no acute intracranial abnormality.  ASSESSMENT AND PLAN: 1. The patient found in feces, unable to walk, failure to thrive.  The     patient says this has not been going on very long and he was seen     by his home health care nurses a week ago.  His labs appeared to be     within normal limits for the most part, so there is a chance that     it is true that this has been going on for a short time, but     clearly his living conditions are very poor.  We will admit him to     the  hospital, feed him as he reports he is not able to get to food,     hydrate him, order physical therapy to see if we can get him     ambulating again and we will have case management follow him to see     if he needs different living situation at discharge. 2. Acute alcohol intoxication  and longstanding alcohol abuse.  We will     put him on the detox protocol.  He has already required some IV     Ativan here in the emergency department, but he is calm and without     any evidence of withdrawal at this time.  We will monitor him     carefully and we will see if he is a candidate for detox after he     is able to walk again. 3. Decubitus ulcer present on arrival.  We will have Wound Care see     him and make recommendations. 4. Known aortic stenosis.  The patient does report some increased     shortness of breath over the last week but given his alcohol abuse,     we may need to monitor this while he is in the hospital.  We will     go head and check cardiac enzymes and a BNP.  He does have a     cardiologist, Dr. Olga Millers, that he follows with regularly. 5. History of chronic obstructive pulmonary disease.  The patient is     not wheezing at this time.  We will order nebs p.r.n. and follow. 6. Malnutrition, likely secondary to his alcohol abuse.  We will     monitor.     Thomasenia Bottoms, MD     CVC/MEDQ  D:  12/13/2010  T:  12/14/2010  Job:  [id number]  cc:   Madolyn Frieze. Jens Som, MD, [facility] Nurses Dr. Dorene Grebe, PCP  Electronically Signed by Buena Irish MD on 01/03/2011 06:08:16 PM

## 2011-01-05 LAB — RAPID URINE DRUG SCREEN, HOSP PERFORMED
Barbiturates: NOT DETECTED
Benzodiazepines: POSITIVE — AB
Cocaine: NOT DETECTED

## 2011-01-05 LAB — ETHANOL: Alcohol, Ethyl (B): 122 mg/dL — ABNORMAL HIGH (ref 0–10)

## 2011-01-05 LAB — URINALYSIS, ROUTINE W REFLEX MICROSCOPIC
Glucose, UA: NEGATIVE mg/dL
Ketones, ur: NEGATIVE mg/dL
Leukocytes, UA: NEGATIVE
Protein, ur: NEGATIVE mg/dL
pH: 6 (ref 5.0–8.0)

## 2011-01-05 LAB — DIFFERENTIAL
Basophils Absolute: 0 10*3/uL (ref 0.0–0.1)
Basophils Relative: 0 % (ref 0–1)
Eosinophils Absolute: 0 10*3/uL (ref 0.0–0.7)
Eosinophils Relative: 0 % (ref 0–5)
Neutrophils Relative %: 81 % — ABNORMAL HIGH (ref 43–77)

## 2011-01-05 LAB — CBC
Hemoglobin: 15.5 g/dL (ref 13.0–17.0)
MCHC: 34.2 g/dL (ref 30.0–36.0)
MCV: 97.8 fL (ref 78.0–100.0)
RBC: 4.64 MIL/uL (ref 4.22–5.81)
WBC: 4.5 10*3/uL (ref 4.0–10.5)

## 2011-01-05 LAB — HEPATIC FUNCTION PANEL
ALT: 187 U/L — ABNORMAL HIGH (ref 0–53)
AST: 269 U/L — ABNORMAL HIGH (ref 0–37)
Alkaline Phosphatase: 133 U/L — ABNORMAL HIGH (ref 39–117)
Bilirubin, Direct: 1.7 mg/dL — ABNORMAL HIGH (ref 0.0–0.3)
Total Bilirubin: 3.6 mg/dL — ABNORMAL HIGH (ref 0.3–1.2)

## 2011-01-05 LAB — COMPREHENSIVE METABOLIC PANEL
ALT: 92 U/L — ABNORMAL HIGH (ref 0–53)
AST: 175 U/L — ABNORMAL HIGH (ref 0–37)
CO2: 24 mEq/L (ref 19–32)
Chloride: 96 mEq/L (ref 96–112)
GFR calc Af Amer: >60 mL/min (ref >60)
GFR calc non Af Amer: >60 mL/min (ref >60)
Glucose, Bld: 124 mg/dL — ABNORMAL HIGH (ref 70–99)
Sodium: 137 mEq/L (ref 135–145)
Total Bilirubin: 1.1 mg/dL (ref 0.3–1.2)

## 2011-01-05 LAB — URINE MICROSCOPIC-ADD ON: Urine-Other: NONE SEEN

## 2011-01-05 LAB — CK: Total CK: 319 U/L — ABNORMAL HIGH (ref 7–232)

## 2011-03-06 ENCOUNTER — Encounter: Payer: Self-pay | Admitting: *Deleted

## 2011-03-06 ENCOUNTER — Ambulatory Visit (INDEPENDENT_AMBULATORY_CARE_PROVIDER_SITE_OTHER): Payer: Medicare Other | Admitting: Cardiology

## 2011-03-06 ENCOUNTER — Encounter: Payer: Self-pay | Admitting: Cardiology

## 2011-03-06 DIAGNOSIS — R7989 Other specified abnormal findings of blood chemistry: Secondary | ICD-10-CM

## 2011-03-06 DIAGNOSIS — I359 Nonrheumatic aortic valve disorder, unspecified: Secondary | ICD-10-CM

## 2011-03-06 DIAGNOSIS — Z01810 Encounter for preprocedural cardiovascular examination: Secondary | ICD-10-CM

## 2011-03-06 DIAGNOSIS — I10 Essential (primary) hypertension: Secondary | ICD-10-CM

## 2011-03-06 DIAGNOSIS — I350 Nonrheumatic aortic (valve) stenosis: Secondary | ICD-10-CM

## 2011-03-06 LAB — PROTIME-INR
INR: 1 ratio (ref 0.8–1.0)
Prothrombin Time: 10.9 s (ref 10.2–12.4)

## 2011-03-06 LAB — CBC WITH DIFFERENTIAL/PLATELET
Eosinophils Absolute: 0.2 10*3/uL (ref 0.0–0.7)
Eosinophils Relative: 3.3 % (ref 0.0–5.0)
Lymphocytes Relative: 31.4 % (ref 12.0–46.0)
MCV: 98.2 fl (ref 78.0–100.0)
Monocytes Absolute: 0.5 10*3/uL (ref 0.1–1.0)
Neutrophils Relative %: 55.8 % (ref 43.0–77.0)
Platelets: 137 10*3/uL — ABNORMAL LOW (ref 150.0–400.0)
WBC: 5.1 10*3/uL (ref 4.5–10.5)

## 2011-03-06 LAB — BASIC METABOLIC PANEL
BUN: 10 mg/dL (ref 6–23)
Calcium: 8.8 mg/dL (ref 8.4–10.5)
Chloride: 103 mEq/L (ref 96–112)
Creatinine, Ser: 0.9 mg/dL (ref 0.4–1.5)
GFR: 82.66 mL/min (ref >60.00)

## 2011-03-06 LAB — HEPATIC FUNCTION PANEL
Albumin: 3.9 g/dL (ref 3.5–5.2)
Alkaline Phosphatase: 60 U/L (ref 39–117)

## 2011-03-06 NOTE — Progress Notes (Signed)
HPI:Samuel [name] is a gentleman with a history of aortic stenosis as well as alcohol abuse.   We scheduled him to have a Myoview, which was performed on Aug 28, 2007.  There was no ischemia or infarction and his ejection fraction was 65%. Last echocardiogram in Dec of 2011 showed normal LV function and severe aortic stenosis with a mean gradient of 57 mm mercury; trivial AI, mild biatrial enlargement. I last saw him in Dec of 2011. He was admitted in Sept of 2012 with ETOH withdrawal and FTT. Since then, he states he has avoided ETOH. He has worsening DOE; occasional dizziness for 2 hours but no syncope; no chest pain, orthopnea, or PND. Mild pedal edema.  Current Outpatient Prescriptions  Medication Sig Dispense Refill  . ALPRAZolam (XANAX) 0.5 MG tablet Take 0.5 mg by mouth at bedtime as needed.        Marland Kitchen amLODipine (NORVASC) 10 MG tablet Take 5 mg by mouth daily.        . metoCLOPramide (REGLAN) 5 MG tablet Take 5 mg by mouth 2 (two) times daily.        Marland Kitchen omega-3 acid ethyl esters (LOVAZA) 1 G capsule Take 1 g by mouth.        . vitamin B-12 (CYANOCOBALAMIN) 100 MCG tablet Take 50 mcg by mouth daily.           Past Medical History  Diagnosis Date  . Depression   . GERD (gastroesophageal reflux disease)   . ETOH abuse   . COPD (chronic obstructive pulmonary disease)   . Aortic stenosis     No past surgical history on file.  History   Social History  . Marital Status: Widowed    Spouse Name: N/A    Number of Children: N/A  . Years of Education: N/A   Occupational History  . Not on file.   Social History Main Topics  . Smoking status: Never Smoker   . Smokeless tobacco: Not on file  . Alcohol Use: Yes     Started 1974 (mostly hard liquor)  . Drug Use: Not on file  . Sexually Active: Not on file   Other Topics Concern  . 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 Remaining systems are negative.  Physical Exam: Well-developed well-nourished in no acute distress.  Skin is warm and dry.  HEENT is normal.  Neck is supple. No thyromegaly.  Chest is clear to auscultation with normal expansion.  Cardiovascular exam is regular rate and rhythm. 3/6 systolic murmur LSB; S2 diminished Abdominal exam nontender or distended. No masses palpated. Extremities show trace edema. neuro grossly intact  ECG NSR, nonspecific ST changes

## 2011-03-06 NOTE — Patient Instructions (Addendum)
Your physician recommends that you schedule a follow-up appointment in: 3 MONTHS  Your physician has requested that you have a cardiac catheterization. Cardiac catheterization is used to diagnose and/or treat various heart conditions. Doctors may recommend this procedure for a number of different reasons. The most common reason is to evaluate chest pain. Chest pain can be a symptom of coronary artery disease (CAD), and cardiac catheterization can show whether plaque is narrowing or blocking your heart's arteries. This procedure is also used to evaluate the valves, as well as measure the blood flow and oxygen levels in different parts of your heart. For further information please visit [email or web address] Please follow instruction sheet, as given.   Your physician recommends that you return for lab work in: TODAY  Your physician has requested that you have an echocardiogram. Echocardiography is a painless test that uses sound waves to create images of your heart. It provides your doctor with information about the size and shape of your heart and how well your heart's chambers and valves are working. This procedure takes approximately one hour. There are no restrictions for this procedure.   REFERRAL TO TCTS IN 3-4 WEEKS FOR EVALUATION FOR AVR  START ASPIRIN 81 MG ONCE DAILY

## 2011-03-06 NOTE — Assessment & Plan Note (Addendum)
Patient is having worsening DOE secondary to AS. He is now willing to proceed with AVR. Plan repeat echo; left and right heart cath (risks and benefits discussed and patient agrees to proceed); refer to CVTS. I have explained that he must avoid ETOH prior to procedure. Recheck LFTs prior to procedure. Add ASA.

## 2011-03-06 NOTE — Assessment & Plan Note (Signed)
Discussed importance of avoiding.

## 2011-03-06 NOTE — Assessment & Plan Note (Signed)
BP controlled; continue present meds. 

## 2011-03-08 MED ORDER — SODIUM CHLORIDE 0.9 % IJ SOLN: 3.0000 mL | INTRAMUSCULAR | Status: DC | PRN

## 2011-03-08 MED ORDER — SODIUM CHLORIDE 0.9 % IJ SOLN: 3.0000 mL | Freq: Two times a day (BID) | INTRAMUSCULAR | Status: DC

## 2011-03-08 MED ORDER — SODIUM CHLORIDE 0.9 % IV SOLN: 250.0000 mL | INTRAVENOUS | Status: DC | PRN

## 2011-03-08 NOTE — Progress Notes (Signed)
Addended by: Lewayne Bunting on: 03/08/2011 03:13 PM   Modules accepted: Orders

## 2011-03-09 ENCOUNTER — Telehealth: Payer: Self-pay | Admitting: Cardiology

## 2011-03-09 NOTE — Telephone Encounter (Signed)
Called stating he wanted to cancel cath for Mon 12/10. He needed more time to talk with family and decide if this is what he wants to do. Advised of his app w/TCTS but he says he will have to think about that also. He will call back when he makes a decision.

## 2011-03-09 NOTE — Telephone Encounter (Signed)
Received a call from Samuel [name] stating pt would like to cancel his cath. Spoke to Dr. Jens Som he states to document what his decision was in regards of cancelling

## 2011-03-09 NOTE — Telephone Encounter (Signed)
Would have patient fu soon(next 2-4 weeks) for further discussion Samuel [name]

## 2011-03-10 ENCOUNTER — Ambulatory Visit (HOSPITAL_COMMUNITY): Payer: Medicare Other | Attending: Cardiology

## 2011-03-10 DIAGNOSIS — I350 Nonrheumatic aortic (valve) stenosis: Secondary | ICD-10-CM

## 2011-03-10 DIAGNOSIS — I379 Nonrheumatic pulmonary valve disorder, unspecified: Secondary | ICD-10-CM | POA: Insufficient documentation

## 2011-03-10 DIAGNOSIS — I079 Rheumatic tricuspid valve disease, unspecified: Secondary | ICD-10-CM | POA: Insufficient documentation

## 2011-03-10 DIAGNOSIS — I359 Nonrheumatic aortic valve disorder, unspecified: Secondary | ICD-10-CM

## 2011-03-13 ENCOUNTER — Inpatient Hospital Stay (HOSPITAL_BASED_OUTPATIENT_CLINIC_OR_DEPARTMENT_OTHER): Admission: RE | Admit: 2011-03-13 | Payer: Medicare Other | Source: Ambulatory Visit | Admitting: Cardiology

## 2011-03-13 SURGERY — JV LEFT AND RIGHT HEART CATHETERIZATION WITH CORONARY/GRAFT ANGIOGRAM
Anesthesia: Moderate Sedation | Laterality: Right

## 2011-03-13 SURGERY — JV LEFT AND RIGHT HEART CATHETERIZATION WITH CORONARY ANGIOGRAM
Anesthesia: Moderate Sedation

## 2011-03-13 MED ORDER — ASPIRIN 81 MG PO CHEW: 324.0000 mg | CHEWABLE_TABLET | ORAL | Status: AC

## 2011-03-13 MED ORDER — SODIUM CHLORIDE 0.9 % IV SOLN: 1.0000 mL/kg/h | INTRAVENOUS | Status: DC

## 2011-03-13 MED ORDER — DIAZEPAM 5 MG PO TABS: 5.0000 mg | ORAL_TABLET | ORAL | Status: AC

## 2011-03-14 NOTE — Telephone Encounter (Signed)
Spoke with pt, he is willing to come back into the office to discuss aortic valve and cath. He is going to have his dtr to call back to try to schedule when she can come to the appt with him Deliah Goody

## 2011-04-05 ENCOUNTER — Encounter: Payer: Medicare Other | Admitting: Cardiothoracic Surgery

## 2011-06-05 ENCOUNTER — Ambulatory Visit: Payer: Medicare Other | Admitting: Cardiology

## 2011-06-06 ENCOUNTER — Ambulatory Visit: Payer: Medicare Other | Admitting: Cardiology

## 2011-06-29 ENCOUNTER — Encounter: Payer: Self-pay | Admitting: *Deleted

## 2011-07-10 ENCOUNTER — Encounter: Payer: Self-pay | Admitting: Cardiology

## 2011-07-10 ENCOUNTER — Ambulatory Visit (INDEPENDENT_AMBULATORY_CARE_PROVIDER_SITE_OTHER): Payer: Medicare Other | Admitting: Cardiology

## 2011-07-10 VITALS — BP 126/64 | HR 68 | Wt 123.0 lb

## 2011-07-10 DIAGNOSIS — I10 Essential (primary) hypertension: Secondary | ICD-10-CM

## 2011-07-10 DIAGNOSIS — I350 Nonrheumatic aortic (valve) stenosis: Secondary | ICD-10-CM

## 2011-07-10 DIAGNOSIS — I359 Nonrheumatic aortic valve disorder, unspecified: Secondary | ICD-10-CM

## 2011-07-10 NOTE — Assessment & Plan Note (Signed)
Blood pressure controlled. Continue present medications. 

## 2011-07-10 NOTE — Patient Instructions (Signed)
Your physician recommends that you schedule a follow-up appointment in 3 MONTHS. 

## 2011-07-10 NOTE — Progress Notes (Signed)
HPI: [name] is a gentleman with a history of aortic stenosis as well as alcohol abuse. We scheduled him to have a Myoview, which was performed on Aug 28, 2007. There was no ischemia or infarction and his ejection fraction was 65%. Last echocardiogram in Dec of 2012 showed normal LV function and severe aortic stenosis with a mean gradient of 57 mm mercury; mild AI. I last saw him in Dec of 2012. We discussed aortic valve replacement and he was in agreement. We arranged an outpatient cardiac catheterization and appointment with cardiothoracic surgery but he canceled both. Since I last saw him, he complains of significant dyspnea on exertion. There is no orthopnea, PND, syncope or exertional chest pain. He has developed mild ankle edema.  Current Outpatient Prescriptions  Medication Sig Dispense Refill  . ALPRAZolam (XANAX) 0.5 MG tablet Take 0.5 mg by mouth at bedtime as needed.        Marland Kitchen amLODipine (NORVASC) 10 MG tablet Take 10 mg by mouth daily.       . Calcium Carbonate Antacid (ALKA-SELTZER ANTACID PO) Take by mouth as needed.      . OIL OF OREGANO PO Take by mouth as directed.      Marland Kitchen omega-3 acid ethyl esters (LOVAZA) 1 G capsule Take 1 g by mouth.        . vitamin B-12 (CYANOCOBALAMIN) 100 MCG tablet Take 50 mcg by mouth daily.         Current Facility-Administered Medications  Medication Dose Route Frequency Provider Last Rate Last Dose  . 0.9 %  sodium chloride infusion  250 mL Intravenous PRN Lewayne Bunting, MD      . sodium chloride 0.9 % injection 3 mL  3 mL Intravenous Q12H Lewayne Bunting, MD      . sodium chloride 0.9 % injection 3 mL  3 mL Intravenous PRN Lewayne Bunting, MD       Facility-Administered Medications Ordered in Other Visits  Medication Dose Route Frequency Provider Last Rate Last Dose  . 0.9 %  sodium chloride infusion  1 mL/kg/hr Intravenous Continuous Lewayne Bunting, MD         Past Medical History  Diagnosis Date  . Depression   . GERD  (gastroesophageal reflux disease)   . ETOH abuse   . COPD (chronic obstructive pulmonary disease)   . Aortic stenosis   . Essential hypertension, benign   . Decreased libido   . DIZZINESS, CHRONIC     No past surgical history on file.  History   Social History  . Marital Status: Widowed    Spouse Name: N/A    Number of Children: N/A  . Years of Education: N/A   Occupational History  . Not on file.   Social History Main Topics  . Smoking status: Never Smoker   . Smokeless tobacco: Not on file  . Alcohol Use: Yes     Started 1974 (mostly hard liquor)  . Drug Use: Not on file  . Sexually Active: Not on file   Other Topics Concern  . 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 Remaining systems are negative.  Physical Exam: Well-developed well-nourished in no acute distress.  Skin is warm and dry.  HEENT is normal.  Neck is supple. No thyromegaly.  Chest is clear to auscultation with normal expansion.  Cardiovascular exam is regular rate and rhythm. 3/6 systolic murmur left sternal. S2 is diminished. Abdominal exam nontender or distended. No masses palpated. Extremities show 1+ ankle edema. neuro grossly intact  ECG sinus rhythm at a rate of 68, nonspecific ST changes.

## 2011-07-10 NOTE — Assessment & Plan Note (Signed)
Patient continues to be significantly symptomatic with his severe aortic stenosis. He has dyspnea with minimal exertion. We arranged cardiac catheterization and appointment with cardiothoracic surgery previously for consideration of aortic valve replacement. However he canceled both of these. He is not interested in pursuing aortic valve replacement stating I am 78 years old. I explained the progressive nature of aortic stenosis. I explained the risk of pulmonary edema, worsening congestive heart failure and sudden cardiac death. He stated he was planning a trip in September. I explained he may not live to September unless we proceed with valve replacement. He continues to decline.

## 2011-10-09 ENCOUNTER — Ambulatory Visit: Payer: Medicare Other | Admitting: Cardiology

## 2011-10-24 ENCOUNTER — Encounter: Payer: Self-pay | Admitting: Cardiology

## 2011-10-24 ENCOUNTER — Encounter (HOSPITAL_COMMUNITY): Payer: Self-pay | Admitting: Family Medicine

## 2011-10-24 ENCOUNTER — Inpatient Hospital Stay (HOSPITAL_COMMUNITY): Payer: Medicare Other

## 2011-10-24 ENCOUNTER — Inpatient Hospital Stay (HOSPITAL_COMMUNITY)
Admission: AD | Admit: 2011-10-24 | Discharge: 2011-10-30 | DRG: 287 | Disposition: A | Discharge status: Home / Self Care | Payer: Medicare Other | Source: Ambulatory Visit | Attending: Family Medicine | Admitting: Family Medicine

## 2011-10-24 ENCOUNTER — Ambulatory Visit (INDEPENDENT_AMBULATORY_CARE_PROVIDER_SITE_OTHER): Payer: Medicare Other | Admitting: Cardiology

## 2011-10-24 VITALS — BP 136/70 | HR 88 | Wt 123.0 lb

## 2011-10-24 DIAGNOSIS — F1020 Alcohol dependence, uncomplicated: Secondary | ICD-10-CM | POA: Diagnosis present

## 2011-10-24 DIAGNOSIS — M419 Scoliosis, unspecified: Secondary | ICD-10-CM

## 2011-10-24 DIAGNOSIS — F1010 Alcohol abuse, uncomplicated: Secondary | ICD-10-CM

## 2011-10-24 DIAGNOSIS — F3289 Other specified depressive episodes: Secondary | ICD-10-CM

## 2011-10-24 DIAGNOSIS — R0602 Shortness of breath: Secondary | ICD-10-CM

## 2011-10-24 DIAGNOSIS — R531 Weakness: Secondary | ICD-10-CM

## 2011-10-24 DIAGNOSIS — R509 Fever, unspecified: Secondary | ICD-10-CM

## 2011-10-24 DIAGNOSIS — J449 Chronic obstructive pulmonary disease, unspecified: Secondary | ICD-10-CM | POA: Diagnosis present

## 2011-10-24 DIAGNOSIS — R1310 Dysphagia, unspecified: Secondary | ICD-10-CM

## 2011-10-24 DIAGNOSIS — R51 Headache: Secondary | ICD-10-CM

## 2011-10-24 DIAGNOSIS — F10929 Alcohol use, unspecified with intoxication, unspecified: Secondary | ICD-10-CM | POA: Diagnosis present

## 2011-10-24 DIAGNOSIS — I2510 Atherosclerotic heart disease of native coronary artery without angina pectoris: Secondary | ICD-10-CM | POA: Diagnosis present

## 2011-10-24 DIAGNOSIS — R4182 Altered mental status, unspecified: Secondary | ICD-10-CM

## 2011-10-24 DIAGNOSIS — I10 Essential (primary) hypertension: Secondary | ICD-10-CM

## 2011-10-24 DIAGNOSIS — I359 Nonrheumatic aortic valve disorder, unspecified: Principal | ICD-10-CM

## 2011-10-24 DIAGNOSIS — R5381 Other malaise: Secondary | ICD-10-CM | POA: Diagnosis present

## 2011-10-24 DIAGNOSIS — J4489 Other specified chronic obstructive pulmonary disease: Secondary | ICD-10-CM

## 2011-10-24 DIAGNOSIS — L02619 Cutaneous abscess of unspecified foot: Secondary | ICD-10-CM

## 2011-10-24 DIAGNOSIS — F329 Major depressive disorder, single episode, unspecified: Secondary | ICD-10-CM

## 2011-10-24 DIAGNOSIS — R0989 Other specified symptoms and signs involving the circulatory and respiratory systems: Secondary | ICD-10-CM

## 2011-10-24 DIAGNOSIS — R0609 Other forms of dyspnea: Secondary | ICD-10-CM | POA: Diagnosis present

## 2011-10-24 DIAGNOSIS — R1031 Right lower quadrant pain: Secondary | ICD-10-CM

## 2011-10-24 DIAGNOSIS — R6882 Decreased libido: Secondary | ICD-10-CM

## 2011-10-24 DIAGNOSIS — R011 Cardiac murmur, unspecified: Secondary | ICD-10-CM

## 2011-10-24 DIAGNOSIS — R42 Dizziness and giddiness: Secondary | ICD-10-CM

## 2011-10-24 HISTORY — DX: Other chronic pain: G89.29

## 2011-10-24 HISTORY — DX: Unspecified osteoarthritis, unspecified site: M19.90

## 2011-10-24 HISTORY — DX: Shortness of breath: R06.02

## 2011-10-24 HISTORY — DX: Low back pain, unspecified: M54.50

## 2011-10-24 HISTORY — DX: Scoliosis, unspecified: M41.9

## 2011-10-24 HISTORY — DX: Alcohol abuse, uncomplicated: F10.10

## 2011-10-24 HISTORY — DX: Low back pain: M54.5

## 2011-10-24 LAB — URINALYSIS, ROUTINE W REFLEX MICROSCOPIC
Ketones, ur: NEGATIVE mg/dL
Leukocytes, UA: NEGATIVE
Protein, ur: NEGATIVE mg/dL
Urobilinogen, UA: 0.2 mg/dL (ref 0.0–1.0)

## 2011-10-24 LAB — AMMONIA: Ammonia: 23 umol/L (ref 11–60)

## 2011-10-24 LAB — CBC WITH DIFFERENTIAL/PLATELET
Basophils Absolute: 0 10*3/uL (ref 0.0–0.1)
Eosinophils Relative: 3 % (ref 0–5)
HCT: 42.8 % (ref 39.0–52.0)
Hemoglobin: 15.1 g/dL (ref 13.0–17.0)
Lymphocytes Relative: 37 % (ref 12–46)
Lymphs Abs: 1.5 10*3/uL (ref 0.7–4.0)
MCV: 93.2 fL (ref 78.0–100.0)
Monocytes Absolute: 0.5 10*3/uL (ref 0.1–1.0)
Monocytes Relative: 13 % — ABNORMAL HIGH (ref 3–12)
RDW: 13.8 % (ref 11.5–15.5)
WBC: 4 10*3/uL (ref 4.0–10.5)

## 2011-10-24 LAB — RAPID URINE DRUG SCREEN, HOSP PERFORMED
Amphetamines: NOT DETECTED
Opiates: NOT DETECTED

## 2011-10-24 LAB — COMPREHENSIVE METABOLIC PANEL
BUN: 10 mg/dL (ref 6–23)
CO2: 25 mEq/L (ref 19–32)
Calcium: 8.5 mg/dL (ref 8.4–10.5)
Chloride: 101 mEq/L (ref 96–112)
Creatinine, Ser: 0.8 mg/dL (ref 0.50–1.35)
GFR calc Af Amer: >90 mL/min (ref >90)
GFR calc non Af Amer: 81 mL/min — ABNORMAL LOW (ref >90)
Glucose, Bld: 88 mg/dL (ref 70–99)
Total Bilirubin: 0.5 mg/dL (ref 0.3–1.2)

## 2011-10-24 LAB — ETHANOL: Alcohol, Ethyl (B): 155 mg/dL — ABNORMAL HIGH (ref 0–11)

## 2011-10-24 LAB — MAGNESIUM: Magnesium: 1.9 mg/dL (ref 1.5–2.5)

## 2011-10-24 LAB — PHOSPHORUS: Phosphorus: 3.8 mg/dL (ref 2.3–4.6)

## 2011-10-24 MED ORDER — VITAMIN B-1 100 MG PO TABS
100.0000 mg | ORAL_TABLET | Freq: Every day | ORAL | Status: DC
  Administered 2011-10-24 – 2011-10-30 (×6): 100 mg via ORAL
  Filled 2011-10-24 (×7): qty 1

## 2011-10-24 MED ORDER — LORAZEPAM 2 MG/ML IJ SOLN
1.0000 mg | Freq: Four times a day (QID) | INTRAMUSCULAR | Status: AC | PRN
  Filled 2011-10-24: qty 1

## 2011-10-24 MED ORDER — ADULT MULTIVITAMIN W/MINERALS CH
1.0000 | ORAL_TABLET | Freq: Every day | ORAL | Status: DC
  Administered 2011-10-24 – 2011-10-30 (×6): 1 via ORAL
  Filled 2011-10-24 (×7): qty 1

## 2011-10-24 MED ORDER — ACETAMINOPHEN 325 MG PO TABS
650.0000 mg | ORAL_TABLET | Freq: Four times a day (QID) | ORAL | Status: DC | PRN
  Administered 2011-10-25: 650 mg via ORAL
  Filled 2011-10-24: qty 2

## 2011-10-24 MED ORDER — SODIUM CHLORIDE 0.9 % IV SOLN: INTRAVENOUS | Status: DC

## 2011-10-24 MED ORDER — PIPERACILLIN-TAZOBACTAM 3.375 G IVPB
3.3750 g | Freq: Three times a day (TID) | INTRAVENOUS | Status: DC
  Administered 2011-10-24 – 2011-10-25 (×2): 3.375 g via INTRAVENOUS
  Filled 2011-10-24 (×3): qty 50

## 2011-10-24 MED ORDER — PANTOPRAZOLE SODIUM 40 MG PO TBEC
40.0000 mg | DELAYED_RELEASE_TABLET | Freq: Every day | ORAL | Status: DC
  Administered 2011-10-24 – 2011-10-30 (×6): 40 mg via ORAL
  Filled 2011-10-24 (×6): qty 1

## 2011-10-24 MED ORDER — THIAMINE HCL 100 MG/ML IJ SOLN
100.0000 mg | Freq: Every day | INTRAMUSCULAR | Status: DC
  Filled 2011-10-24 (×7): qty 1

## 2011-10-24 MED ORDER — SODIUM CHLORIDE 0.9 % IJ SOLN
3.0000 mL | Freq: Two times a day (BID) | INTRAMUSCULAR | Status: DC
  Administered 2011-10-24 – 2011-10-30 (×10): 3 mL via INTRAVENOUS

## 2011-10-24 MED ORDER — ACETAMINOPHEN 650 MG RE SUPP: 650.0000 mg | Freq: Four times a day (QID) | RECTAL | Status: DC | PRN

## 2011-10-24 MED ORDER — AMLODIPINE BESYLATE 10 MG PO TABS
10.0000 mg | ORAL_TABLET | Freq: Every day | ORAL | Status: DC
  Administered 2011-10-25 – 2011-10-30 (×5): 10 mg via ORAL
  Filled 2011-10-24 (×6): qty 1

## 2011-10-24 MED ORDER — ONDANSETRON HCL 4 MG/2ML IJ SOLN: 4.0000 mg | Freq: Four times a day (QID) | INTRAMUSCULAR | Status: DC | PRN

## 2011-10-24 MED ORDER — ONDANSETRON HCL 4 MG PO TABS
4.0000 mg | ORAL_TABLET | Freq: Four times a day (QID) | ORAL | Status: DC | PRN
  Administered 2011-10-25 – 2011-10-28 (×3): 4 mg via ORAL
  Filled 2011-10-24 (×3): qty 1

## 2011-10-24 MED ORDER — FOLIC ACID 1 MG PO TABS
1.0000 mg | ORAL_TABLET | Freq: Every day | ORAL | Status: DC
  Administered 2011-10-25 – 2011-10-30 (×6): 1 mg via ORAL
  Filled 2011-10-24 (×7): qty 1

## 2011-10-24 MED ORDER — FUROSEMIDE 10 MG/ML IJ SOLN
40.0000 mg | Freq: Two times a day (BID) | INTRAMUSCULAR | Status: DC
  Administered 2011-10-24: 40 mg via INTRAVENOUS
  Filled 2011-10-24 (×4): qty 4

## 2011-10-24 MED ORDER — LORAZEPAM 0.5 MG PO TABS: 1.0000 mg | ORAL_TABLET | Freq: Four times a day (QID) | ORAL | Status: AC | PRN

## 2011-10-24 MED ORDER — OMEGA-3-ACID ETHYL ESTERS 1 G PO CAPS
1.0000 g | ORAL_CAPSULE | Freq: Every day | ORAL | Status: DC
  Administered 2011-10-25 – 2011-10-30 (×5): 1 g via ORAL
  Filled 2011-10-24 (×7): qty 1

## 2011-10-24 MED ORDER — SENNOSIDES-DOCUSATE SODIUM 8.6-50 MG PO TABS
1.0000 | ORAL_TABLET | Freq: Every evening | ORAL | Status: DC | PRN
  Filled 2011-10-24: qty 1

## 2011-10-24 NOTE — H&P (Signed)
History and Physical Examination  Date: 10/24/2011  Patient name: Samuel [name] Medical record number: [id number] Date of birth: 1926/05/20 Age: 78 y.o. Gender: male PCP: Ralene Ok, MD Cardiologist: Dr. Jens Som  Chief Complaint: Sent from cardiologist office for direct admit   Historian: Pt is a very poor historian.  Some history taken from clinic notes and EMR records.  No labs or imaging available at time of admission.   History of Present Illness: Samuel [name] is an 78 y.o. male [name] is a gentleman with a history of aortic stenosis as well as alcohol abuse.  He was seen by his cardiologist today for followup and apparently had declined significantly and was not doing well.  He has had progressive weakness and physical deconditioning over the past 2 months.  Reportedly in May 2013 pt came down with a viral infection associated with severe malaise, fever and chills.  He has not been able to get out of bed for the last 4 weeks. He also reports that he has been drinking large amounts of alcohol over that time.  He cannot quantify the amount he has been drinking.  He reports persistent fever and chills and malaise.  He denies chest pain and shortness of breath.  He arrived to the hospital poorly groomed, significantly weakened and lethargic (possibly under the influence of alcohol) and had feces on his feet and no shoes.    Recent cardiac history:  Myoview,  Aug 28, 2007: no ischemia or infarction and his ejection fraction was 65%. echocardiogram Dec 2012 showed normal LV function and severe aortic stenosis with a mean gradient of 57 mm mercury; mild AI.  Had discussed aortic valve replacement in Dec 2012 and he was in agreement. We arranged an outpatient cardiac catheterization and appointment with cardiothoracic surgery but he canceled both and declined further intervention at last cardiology OV.   Past Medical History Past Medical History  Diagnosis Date  . Depression   . GERD  (gastroesophageal reflux disease)   . ETOH abuse   . COPD (chronic obstructive pulmonary disease)   . Aortic stenosis   . Essential hypertension, benign   . Decreased libido   . DIZZINESS, CHRONIC   . Alcohol abuse     Past Surgical History No past surgical history on file.  Home Meds: Prior to Admission medications   Medication Sig Start Date End Date Taking? Authorizing Provider  ALPRAZolam Prudy Feeler) 0.5 MG tablet Take 0.5 mg by mouth every 8 (eight) hours as needed. For anxiety   Yes Historical Provider, MD  amLODipine (NORVASC) 10 MG tablet Take 10 mg by mouth daily.    Yes Historical Provider, MD  omega-3 acid ethyl esters (LOVAZA) 1 G capsule Take 1 g by mouth.     Yes Historical Provider, MD  vitamin B-12 (CYANOCOBALAMIN) 100 MCG tablet Take 50 mcg by mouth daily.     Yes Historical Provider, MD    Allergies: Review of patient's allergies indicates no known allergies.  Social History:  History   Social History  . Marital Status: Widowed    Spouse Name: N/A    Number of Children: N/A  . Years of Education: N/A   Occupational History  . Not on file.   Social History Main Topics  . Smoking status: Never Smoker   . Smokeless tobacco: Not on file  . Alcohol Use: Yes     Started 1974 (mostly hard liquor)  . Drug Use: Not on file  . Sexually Active: Not on  file   Other Topics Concern  . 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 Other      no known medical hx of heart disease   Review of Systems: Pertinent items are noted in HPI. Pt is a very poor historian but denies all other systems.   Physical Exam: Blood pressure 136/64, pulse 83, temperature 98 F (36.7 C), resp. rate 20, height 6\' 1"  (1.854 m), weight 78.2 kg (172 lb 6.4 oz), SpO2 96.00%. General - poorly  groomed male, lethargic, barely able to open eyes, answers questions HEENT - NCAT, MM dry and pale, edentulous, foul breath Neck - supple, no JVD seen, thyroid no nodules palpated Lungs - BBS shallow, no crackles or rhonchi heard, poor effort  CV - normal s1, s2 sounds with loud murmur systolic heard Abd - soft, normal bs, nondistended, nontender, no masses palpated, no HSM Ext - bilateral pedal edema 1-2+ pitting Skin- no gross lesions seen Neuro - moving all extremities, no cooperative for exam  Lab  And Imaging results:  No labs or imaging available at time of admission  Impression   ALCOHOL ABUSE, chronic with recent exacerbation  DEPRESSION  Essential hypertension, benign  AORTIC STENOSIS  COPD  DIZZINESS, CHRONIC  HEART MURMUR  DYSPNEA ON EXERTION  Alcohol intoxication  Physical deconditioning  Generalized weakness  Altered mental status  Fever and chills  Plan  Admit to telemetry for continuous cardiac monitoring.  Order stat labs including a metabolic panel, ammonia level, CBC with differential, urinalysis, urine culture, blood cultures x2, urine toxicology screen, serum alcohol level, stat chest x-ray, CT head without contrast, start gentle IV fluid hydration, alcohol withdrawal protocol, banana bag IV, empiric antibiotics after blood cultures taken, cardiology consult and will see patient, echocardiogram, cycle cardiac enzymes to rule out acute myocardial ischemia PT OT consult, social worker consult, further orders pending test results, please see admission orders and follow hospital course.     Standley Dakins MD [facility] [place], [place] [phone] 10/24/2011, 6:23 PM

## 2011-10-24 NOTE — Progress Notes (Signed)
Pt direct admit in W/C placed in bed telemetry placed Saline lock IV started in Lt Hand. Pt deconditioned has BM on legs and feet. States he lives home alone and has been extremely weak and almost bedbound for 4 weeks. Marisa Cyphers RN

## 2011-10-24 NOTE — Assessment & Plan Note (Signed)
Patient will need pulmonary function tests after he is diuresed and his overall status improves.

## 2011-10-24 NOTE — Assessment & Plan Note (Signed)
Continue present blood pressure medications. 

## 2011-10-24 NOTE — Assessment & Plan Note (Addendum)
Patient has presented with increased dyspnea on exertion and volume excess. He continues to have evidence of severe aortic stenosis on physical examination. We had long discussions previously concerning severity of his disease and that aortic valve replacement was the only possible correction. He declined at that point but now he is amenable. However he has had increased alcohol intake recently. I am concerned about the possibility of withdrawal. He also has generalized weakness and recent fevers/chills. He needs further evaluation of these issues. He also needs repeat echocardiogram for LV function and to assess aortic stenosis. He also needs diuresis and will begin Lasix 40 mg IV twice a day with close evaluation of renal function. He also needs for laboratories to assess liver function, renal function etc. I am not sure that he is a candidate for aortic valve replacement but we will have surgery review after above issues addressed. We could also consider TAVR. Patient will need cardiac catheterization once he improves.

## 2011-10-24 NOTE — Progress Notes (Signed)
Text page to MD to let know that direct pt is here. T Jesse Nosbisch RN

## 2011-10-24 NOTE — Progress Notes (Signed)
HPI: Mr. Havey is a gentleman with a history of aortic stenosis as well as alcohol abuse. We scheduled him to have a Myoview, which was performed on Aug 28, 2007. There was no ischemia or infarction and his ejection fraction was 65%. Last echocardiogram in Dec of 2012 showed normal LV function and severe aortic stenosis with a mean gradient of 57 mm mercury; mild AI. We previously discussed aortic valve replacement in Dec 2012 and he was in agreement. We arranged an outpatient cardiac catheterization and appointment with cardiothoracic surgery but he canceled both and declined further intervention at last OV. Since I last saw him in April of 2013, he is not doing well. He states he had a virus in May with fevers and chills and general malaise and has been weak ever since. He describes progressive dyspnea on exertion. There is no orthopnea or PND but he has had progressive pedal edema. There is no chest pain or syncope. His fevers and chills have resolved. Note he has been drinking significant amounts of alcohol recently.   Current Outpatient Prescriptions  Medication Sig Dispense Refill  . ALPRAZolam (XANAX) 0.5 MG tablet Take 0.5 mg by mouth at bedtime as needed.        Marland Kitchen amLODipine (NORVASC) 10 MG tablet Take 10 mg by mouth daily.       . Calcium Carbonate Antacid (ALKA-SELTZER ANTACID PO) Take by mouth as needed.      . OIL OF OREGANO PO Take by mouth as directed.      Marland Kitchen omega-3 acid ethyl esters (LOVAZA) 1 G capsule Take 1 g by mouth.        . vitamin B-12 (CYANOCOBALAMIN) 100 MCG tablet Take 50 mcg by mouth daily.         Current Facility-Administered Medications  Medication Dose Route Frequency Provider Last Rate Last Dose  . 0.9 %  sodium chloride infusion  250 mL Intravenous PRN Lewayne Bunting, MD      . sodium chloride 0.9 % injection 3 mL  3 mL Intravenous Q12H Lewayne Bunting, MD      . sodium chloride 0.9 % injection 3 mL  3 mL Intravenous PRN Lewayne Bunting, MD        Facility-Administered Medications Ordered in Other Visits  Medication Dose Route Frequency Provider Last Rate Last Dose  . 0.9 %  sodium chloride infusion  1 mL/kg/hr Intravenous Continuous Lewayne Bunting, MD         Past Medical History  Diagnosis Date  . Depression   . GERD (gastroesophageal reflux disease)   . ETOH abuse   . COPD (chronic obstructive pulmonary disease)   . Aortic stenosis   . Essential hypertension, benign   . Decreased libido   . DIZZINESS, CHRONIC     No past surgical history on file.  History   Social History  . Marital Status: Widowed    Spouse Name: N/A    Number of Children: N/A  . Years of Education: N/A   Occupational History  . Not on file.   Social History Main Topics  . Smoking status: Never Smoker   . Smokeless tobacco: Not on file  . Alcohol Use: Yes     Started 1974 (mostly hard liquor)  . Drug Use: Not on file  . Sexually Active: Not on file   Other Topics Concern  . Not on file   Social History Narrative   Retired worked for [employer] in [place]:  1st and 2nd died and 3rd was separatedNever SmokedAlcohol use -yes    ROS: weakness but no fevers or chills, productive cough, hemoptysis, dysphasia, odynophagia, melena, hematochezia, dysuria, hematuria, rash, seizure activity, orthopnea, PND, claudication. Remaining systems are negative.  Physical Exam: Well-developed chronically ill appearing in no acute distress.  Skin is warm and dry.  HEENT is normal.  Neck is supple. Diminished carotid upstroke bilaterally Chest is clear to auscultation with normal expansion.  Cardiovascular exam is regular rate and rhythm. 3/6 systolic murmur left sternal border. S2 is diminished. Abdominal exam nontender or distended. No masses palpated. Extremities show 2+ ankle edema. neuro grossly intact  ECG sinus rhythm at a rate of 88. No significant ST changes.

## 2011-10-24 NOTE — Assessment & Plan Note (Signed)
Patient has a history of alcohol abuse and has been drinking recently. He will need to be watched closely for withdrawal.

## 2011-10-24 NOTE — Progress Notes (Signed)
Text page MD again to let know that direct admit is here for orders. Marisa Cyphers RN

## 2011-10-24 NOTE — Progress Notes (Signed)
ANTIBIOTIC CONSULT NOTE - INITIAL  Pharmacy Consult for zosyn Indication: rule out pneumonia  No Known Allergies  Patient Measurements: Height: 6\' 1"  (185.4 cm) Weight: 172 lb 6.4 oz (78.2 kg) (bedscale) IBW/kg (Calculated) : 79.9  Adjusted Body Weight:   Vital Signs: Temp: 98 F (36.7 C) (07/23 1721) BP: 136/64 mmHg (07/23 1721) Pulse Rate: 83  (07/23 1721) Intake/Output from previous day:   Intake/Output from this shift:    Labs: No results found for this basename: WBC:3,HGB:3,PLT:3,LABCREA:3,CREATININE:3 in the last 72 hours Estimated Creatinine Clearance: 70 ml/min (by C-G formula based on Cr of 0.9). No results found for this basename: VANCOTROUGH:2,VANCOPEAK:2,VANCORANDOM:2,GENTTROUGH:2,GENTPEAK:2,GENTRANDOM:2,TOBRATROUGH:2,TOBRAPEAK:2,TOBRARND:2,AMIKACINPEAK:2,AMIKACINTROU:2,AMIKACIN:2, in the last 72 hours   Microbiology: No results found for this or any previous visit (from the past 720 hour(s)).  Medical History: Past Medical History  Diagnosis Date  . Depression   . GERD (gastroesophageal reflux disease)   . COPD (chronic obstructive pulmonary disease)   . Aortic stenosis   . Essential hypertension, benign   . Decreased libido   . DIZZINESS, CHRONIC   . Alcohol abuse   . Shortness of breath 10/24/11    "all the time"  . Arthritis     "in the low vertebra"  . Chronic lower back pain     Medications:  Scheduled:    . amLODipine  10 mg Oral Daily  . folic acid  1 mg Oral Daily  . multivitamin with minerals  1 tablet Oral Daily  . omega-3 acid ethyl esters  1 g Oral Daily  . pantoprazole  40 mg Oral Q0600  . sodium chloride  3 mL Intravenous Q12H  . thiamine  100 mg Oral Daily   Or  . thiamine  100 mg Intravenous Daily   Infusions:    . sodium chloride     Assessment: 78 yo male with suspected pneumonia will be started on zosyn therapy. Historic renal fxn was ok. Scr today was 0.8  Goal of Therapy:     Plan:  1) Start zosyn 3.375g iv q8h  (4hr infusion). 2) Pharmacy will sign off.  Jyssica Rief, Tsz-Yin 10/24/2011,7:10 PM

## 2011-10-25 DIAGNOSIS — J449 Chronic obstructive pulmonary disease, unspecified: Secondary | ICD-10-CM

## 2011-10-25 DIAGNOSIS — I359 Nonrheumatic aortic valve disorder, unspecified: Secondary | ICD-10-CM

## 2011-10-25 DIAGNOSIS — J4489 Other specified chronic obstructive pulmonary disease: Secondary | ICD-10-CM

## 2011-10-25 LAB — CBC
HCT: 42.6 % (ref 39.0–52.0)
Hemoglobin: 15.1 g/dL (ref 13.0–17.0)
MCH: 32.8 pg (ref 26.0–34.0)
MCV: 92.4 fL (ref 78.0–100.0)
RBC: 4.61 MIL/uL (ref 4.22–5.81)

## 2011-10-25 LAB — COMPREHENSIVE METABOLIC PANEL
AST: 38 U/L — ABNORMAL HIGH (ref 0–37)
Albumin: 3.7 g/dL (ref 3.5–5.2)
Calcium: 8.9 mg/dL (ref 8.4–10.5)
Creatinine, Ser: 0.87 mg/dL (ref 0.50–1.35)
Total Protein: 7.6 g/dL (ref 6.0–8.3)

## 2011-10-25 LAB — HEMOGLOBIN A1C
Hgb A1c MFr Bld: 5 % (ref <5.7)
Mean Plasma Glucose: 97 mg/dL (ref <117)

## 2011-10-25 LAB — TSH: TSH: 2.355 u[IU]/mL (ref 0.350–4.500)

## 2011-10-25 LAB — CARDIAC PANEL(CRET KIN+CKTOT+MB+TROPI)
CK, MB: 2.9 ng/mL (ref 0.3–4.0)
Troponin I: <0.3 ng/mL (ref <0.30)

## 2011-10-25 LAB — PROTIME-INR: Prothrombin Time: 14.1 seconds (ref 11.6–15.2)

## 2011-10-25 MED ORDER — FUROSEMIDE 40 MG PO TABS
40.0000 mg | ORAL_TABLET | Freq: Every day | ORAL | Status: DC
  Administered 2011-10-25 – 2011-10-30 (×5): 40 mg via ORAL
  Filled 2011-10-25 (×6): qty 1

## 2011-10-25 MED ORDER — HEPARIN SODIUM (PORCINE) 5000 UNIT/ML IJ SOLN
5000.0000 [IU] | Freq: Three times a day (TID) | INTRAMUSCULAR | Status: DC
  Administered 2011-10-25 – 2011-10-27 (×7): 5000 [IU] via SUBCUTANEOUS
  Filled 2011-10-25 (×10): qty 1

## 2011-10-25 MED ORDER — ASPIRIN 81 MG PO CHEW
81.0000 mg | CHEWABLE_TABLET | Freq: Every day | ORAL | Status: DC
  Administered 2011-10-25 – 2011-10-30 (×5): 81 mg via ORAL
  Filled 2011-10-25 (×2): qty 1
  Filled 2011-10-25: qty 4
  Filled 2011-10-25 (×2): qty 1

## 2011-10-25 NOTE — Progress Notes (Signed)
Pt was screened for ability to swallow.  Pt passed evaluation.  Heart healthy diet ordered per request.

## 2011-10-25 NOTE — Progress Notes (Signed)
@   Subjective:  Denies CP or dyspnea; "achy all over"   Objective:  Filed Vitals:   10/24/11 2243 10/25/11 0057 10/25/11 0209 10/25/11 0645  BP: 130/62  143/61 136/67  Pulse: 68  78 87  Temp: 97.4 F (36.3 C) 96.3 F (35.7 C) 97.4 F (36.3 C) 98 F (36.7 C)  TempSrc: Oral  Oral Oral  Resp: 18  18 18   Height:      Weight:    75.4 kg (166 lb 3.6 oz)  SpO2: 95%  93% 96%    Intake/Output from previous day:  Intake/Output Summary (Last 24 hours) at 10/25/11 0705 Last data filed at 10/25/11 0357  Gross per 24 hour  Intake      0 ml  Output   2525 ml  Net  -2525 ml    Physical Exam: Physical exam: Well-developed chronically ill appearing in no acute distress.  Skin is warm and dry.  HEENT is normal.  Neck is supple.  Chest is clear to auscultation with normal expansion.  Cardiovascular exam is regular rate and rhythm. 3/6 systolic murmur LSB Abdominal exam nontender or distended. No masses palpated. Extremities show no edema. neuro grossly intact    Lab Results: Basic Metabolic Panel:  Basename 10/25/11 0251 10/24/11 1935  NA 142 141  K 3.7 4.4  CL 98 101  CO2 28 25  GLUCOSE 85 88  BUN 10 10  CREATININE 0.87 0.80  CALCIUM 8.9 8.5  MG -- 1.9  PHOS -- 3.8   CBC:  Basename 10/25/11 0251 10/24/11 1935  WBC 4.9 4.0  NEUTROABS -- 1.9  HGB 15.1 15.1  HCT 42.6 42.8  MCV 92.4 93.2  PLT 136* 141*   Cardiac Enzymes:  Basename 10/25/11 0243 10/24/11 1925  CKTOTAL 58 74  CKMB 2.9 2.6  CKMBINDEX -- --  TROPONINI <0.30 <0.30     Assessment/Plan:  1) Aortic stenosis - plan repeat echo today; volume status improved; change lasix to 40 mg po daily; follow renal function; he will ultimately need right and left cath and consideration of AVR; if felt not to be a surgical candidate, eval for TAVR. Will ask PT to see to help with strength. 2) ETOH abuse - I am concerned about possibility of withdrawal, follow closely 3) HTN-BP controlled 4) COPD - plan PFTs when  volume status optimized. 5) Previous fever - afebrile; blood cultures pending; UA neg; would favor DC antibiotics and follow.  Olga Millers 10/25/2011, 7:05 AM

## 2011-10-25 NOTE — Progress Notes (Signed)
[facility] PROGRESS NOTE  LEMMIE VANLANEN VHQ:[id number] DOB: 1926-07-02 DOA: 10/24/2011 PCP: Ralene Ok, MD  Assessment/Plan: Active Problems:  ALCOHOL ABUSE  DEPRESSION  Essential hypertension, benign  AORTIC STENOSIS  COPD  DIZZINESS, CHRONIC  HEART MURMUR  DYSPNEA ON EXERTION  Alcohol intoxication  Physical deconditioning  Generalized weakness  Altered mental status  Fever and chills  Dysphagia  1. Aortic Stenosis/heart murmur - Defer to cardiology at this juncture  2. ETOH abuse: Will monitor closely.  Currently on CIWA protocol.  Has not required any lorazepam.    3. HTN: Last check was 131/63 which is close to target.  Did have elevated blood pressure of 158/77.  But has had relatively well controlled BP's today.  Will continue to monitor.  4. Generalized weakness/dyspnea on exertion: Could be related to number one.  Will f/u with work up and cardiology currently on board.  5. DVT prophylaxis   Code Status: Full Family Communication: No family at bedside. Disposition Plan: Per Cardiology at this juncture   Brief narrative: Please refer to HPI  Consultants:  Cardiology  Procedures:  2 D echocardiogram  Antibiotics:  None  HPI/Subjective: Patient has no new complaints.  Denies any chest pain, SOB, nausea, diaphoresis.  Objective: Filed Vitals:   10/25/11 0209 10/25/11 0645 10/25/11 1030 10/25/11 1312  BP: 143/61 136/67 158/77 131/63  Pulse: 78 87  87  Temp: 97.4 F (36.3 C) 98 F (36.7 C)  97.6 F (36.4 C)  TempSrc: Oral Oral    Resp: 18 18  20   Height:      Weight:  75.4 kg (166 lb 3.6 oz)    SpO2: 93% 96%  96%    Intake/Output Summary (Last 24 hours) at 10/25/11 1841 Last data filed at 10/25/11 1650  Gross per 24 hour  Intake    600 ml  Output   3426 ml  Net  -2826 ml    Exam:   General:  Pt in NAD, A and O x 3  Cardiovascular: systolic murmur, RRR  Respiratory: Clear to auscultation, no wheezes  Abdomen: Soft,  NT, ND  Data Reviewed: Basic Metabolic Panel:  Lab 10/25/11 4132 10/24/11 1935  NA 142 141  K 3.7 4.4  CL 98 101  CO2 28 25  GLUCOSE 85 88  BUN 10 10  CREATININE 0.87 0.80  CALCIUM 8.9 8.5  MG -- 1.9  PHOS -- 3.8   Liver Function Tests:  Lab 10/25/11 0251 10/24/11 1935  AST 38* 44*  ALT 14 15  ALKPHOS 64 61  BILITOT 0.8 0.5  PROT 7.6 7.6  ALBUMIN 3.7 3.6   No results found for this basename: LIPASE:5,AMYLASE:5 in the last 168 hours  Lab 10/24/11 1902  AMMONIA 23   CBC:  Lab 10/25/11 0251 10/24/11 1935  WBC 4.9 4.0  NEUTROABS -- 1.9  HGB 15.1 15.1  HCT 42.6 42.8  MCV 92.4 93.2  PLT 136* 141*   Cardiac Enzymes:  Lab 10/25/11 1010 10/25/11 0243 10/24/11 1925  CKTOTAL 70 58 74  CKMB 3.1 2.9 2.6  CKMBINDEX -- -- --  TROPONINI <0.30 <0.30 <0.30   BNP (last 3 results)  Basename 10/25/11 0251 10/24/11 1935 12/14/10 0512  PROBNP 537.7* 732.1* 1061.0*   CBG: No results found for this basename: GLUCAP:5 in the last 168 hours  No results found for this or any previous visit (from the past 240 hour(s)).   Studies: Ct Head Wo Contrast  10/24/2011  *RADIOLOGY REPORT*  Clinical Data: Altered mental  status.  CT HEAD WITHOUT CONTRAST  Technique:  Contiguous axial images were obtained from the base of the skull through the vertex without contrast.  Comparison: 12/13/2010  Findings: Stable atrophy and mild small vessel disease. The brain demonstrates no evidence of hemorrhage, infarction, edema, mass effect, extra-axial fluid collection, hydrocephalus or mass lesion. The skull is unremarkable.  IMPRESSION: No acute abnormalities by CT.  Stable atrophy and small vessel disease.  Original Report Authenticated By: Reola Calkins, M.D.   Dg Chest Port 1 View  10/24/2011  *RADIOLOGY REPORT*  Clinical Data: Altered mental status  PORTABLE CHEST - 1 VIEW  Comparison: 12/13/2010  Findings: Severe dextroscoliosis in the thoracic spine is unchanged.  Lungs are clear without  infiltrate or effusion.  Negative for heart failure or pneumonia.  IMPRESSION: No acute cardiopulmonary disease.  Original Report Authenticated By: Camelia Phenes, M.D.    Scheduled Meds:   . amLODipine  10 mg Oral Daily  . aspirin  81 mg Oral Daily  . folic acid  1 mg Oral Daily  . furosemide  40 mg Oral Daily  . heparin subcutaneous  5,000 Units Subcutaneous Q8H  . multivitamin with minerals  1 tablet Oral Daily  . omega-3 acid ethyl esters  1 g Oral Daily  . pantoprazole  40 mg Oral Q0600  . sodium chloride  3 mL Intravenous Q12H  . thiamine  100 mg Oral Daily   Or  . thiamine  100 mg Intravenous Daily  . DISCONTD: furosemide  40 mg Intravenous BID  . DISCONTD: piperacillin-tazobactam (ZOSYN)  IV  3.375 g Intravenous Q8H   Continuous Infusions:   . DISCONTD: sodium chloride      Active Problems:  ALCOHOL ABUSE  DEPRESSION  Essential hypertension, benign  AORTIC STENOSIS  COPD  DIZZINESS, CHRONIC  HEART MURMUR  DYSPNEA ON EXERTION  Alcohol intoxication  Physical deconditioning  Generalized weakness  Altered mental status  Fever and chills  Dysphagia    Time spent: > 35 minutes    Penny Pia  [facility] Pager [phone]. If 8PM-8AM, please contact night-coverage at [email or web address], password [facility] 10/25/2011, 6:41 PM  LOS: 1 day

## 2011-10-25 NOTE — Progress Notes (Signed)
Physical Therapy Evaluation Patient Details Name: Samuel ALFIERI MRN: [id number] DOB: Dec 25, 1926 Today's Date: 10/25/2011 Time: 4098-1191 PT Time Calculation (min): 15 min  PT Assessment / Plan / Recommendation Clinical Impression  Pt is 78 yo male who presents with ETOH abuse and dizziness, pt's mobility is very limited today by dizziness.  Given mobility limitations and condition of poor hygeine in which he presented to hospital, PT recommends SNF though likely that pt will refuse this.  If so, recommend [facility] and HHPT.  PT will follow.Marland Kitchen    PT Assessment  Patient needs continued PT services    Follow Up Recommendations  Skilled nursing facility;Supervision for mobility/OOB    Barriers to Discharge Decreased caregiver support      Equipment Recommendations  Defer to next venue    Recommendations for Other Services OT consult   Frequency Min 3X/week    Precautions / Restrictions Precautions Precautions: Fall Restrictions Weight Bearing Restrictions: No   Pertinent Vitals/Pain No c/o pain C/o dizziness and nausea, RN notified      Mobility  Bed Mobility Bed Mobility: Sit to Supine;Scooting to HOB Sit to Supine: 5: Supervision Scooting to [facility]: With rail;5: Supervision Details for Bed Mobility Assistance: pt able to get self into bed but has difficulty moving within the bed to get legs all the way in and off the footboard.  vc's help pt problem solve getting himself situated in bed and to use bed rails for more effective mvmt. Transfers Transfers: Sit to Stand;Stand to Sit Sit to Stand: 4: Min assist;From chair/3-in-1;With upper extremity assist Stand to Sit: 4: Min assist;To bed;With upper extremity assist Stand Pivot Transfers: 4: Min assist Details for Transfer Assistance: min A to steady Ambulation/Gait Ambulation/Gait Assistance: Not tested (comment) (pt too dizzy to ambulate this morning) Stairs: No Wheelchair Mobility Wheelchair Mobility: No    Exercises  General Exercises - Lower Extremity Ankle Circles/Pumps: AROM;10 reps;Both;Supine Quad Sets: AROM;Both;10 reps;Supine Gluteal Sets: AROM;Both;10 reps;Supine Heel Slides: AROM;Both;10 reps;Supine Straight Leg Raises: AROM;Both;10 reps;Supine   PT Diagnosis: Generalized weakness;Altered mental status  PT Problem List: Decreased strength;Decreased activity tolerance;Decreased balance;Decreased mobility;Decreased cognition;Decreased safety awareness;Other (comment) (dizziness) PT Treatment Interventions: DME instruction;Gait training;Functional mobility training;Therapeutic activities;Therapeutic exercise;Balance training;Patient/family education;Cognitive remediation   PT Goals Acute Rehab PT Goals PT Goal Formulation: With patient Time For Goal Achievement: 11/08/11 Potential to Achieve Goals: Fair Pt will go Supine/Side to Sit: with modified independence PT Goal: Supine/Side to Sit - Progress: Goal set today Pt will go Sit to Supine/Side: with modified independence PT Goal: Sit to Supine/Side - Progress: Goal set today Pt will go Sit to Stand: with modified independence PT Goal: Sit to Stand - Progress: Goal set today Pt will go Stand to Sit: with modified independence PT Goal: Stand to Sit - Progress: Goal set today Pt will Ambulate: >150 feet;with supervision;with rolling walker PT Goal: Ambulate - Progress: Goal set today Pt will Perform Home Exercise Program: Independently PT Goal: Perform Home Exercise Program - Progress: Goal set today  Visit Information  Last PT Received On: 10/25/11 Assistance Needed: +1    Subjective Data  Subjective: I have been really dizzy and nauseous since I got up from the (ECHO) table Patient Stated Goal: return to home to take care of his dog   Prior Functioning  Home Living Lives With: Alone Available Help at Discharge: Family Type of Home: Apartment Home Access: Level entry Home Layout: One level Home Adaptive Equipment: Walker -  rolling;Wheelchair - manual Additional Comments: pt reports that  he uses his RW within the house but does not take it out, questionable historian.  Pt's daughter lives in [place] but insure how often she checks on him, sister in [place] Prior Function Level of Independence: Independent with assistive device(s) Able to Take Stairs?:  (did not need to navigate stairs) Driving: Yes Vocation: Retired Comments: Pt was functioning independently but at a low level.  He reports that he was getting up to do his cooking and take his dog out but chart reports that he was very dirty upon arrival, with feces on feet and hands. Communication Communication: No difficulties    Cognition  Overall Cognitive Status: No family/caregiver present to determine baseline cognitive functioning Arousal/Alertness: Awake/alert Orientation Level: Appears intact for tasks assessed Behavior During Session: [facility] for tasks performed Cognition - Other Comments: poor historian    Extremity/Trunk Assessment Right Lower Extremity Assessment RLE ROM/Strength/Tone: Deficits RLE ROM/Strength/Tone Deficits: knee ext 3/5, hip flex 3+/5, hip ext 3/5 RLE Sensation: WFL - Light Touch RLE Coordination: WFL - gross motor Left Lower Extremity Assessment LLE ROM/Strength/Tone: Deficits LLE ROM/Strength/Tone Deficits: hip flex 3+/5, knee ext 4-/5, hip ext 3/5 LLE Sensation: WFL - Light Touch LLE Coordination: WFL - gross motor Trunk Assessment Trunk Assessment: Other exceptions (scoliosis)   Balance Balance Balance Assessed: Yes Dynamic Standing Balance Dynamic Standing - Balance Support: Left upper extremity supported;During functional activity Dynamic Standing - Level of Assistance: 4: Min assist  End of Session PT - End of Session Activity Tolerance: Other (comment) (limited by dizziness and nausea) Patient left: in bed;with call bell/phone within reach Nurse Communication: Other (comment) (nausea meds requested)  GP    Lyanne Co, PT  Acute Rehab Services  [phone]   [place], [place] 10/25/2011, 11:01 AM

## 2011-10-25 NOTE — Progress Notes (Signed)
  Echocardiogram 2D Echocardiogram has been performed.  Samuel [name] 10/25/2011, 10:04 AM

## 2011-10-25 NOTE — Progress Notes (Signed)
RT placed sputum collection bottle in room. Patient was unable to obtain sputum for collection at this time. RT explained to patient that he felt like he had to cough up sputum to spit in container. RT will continue to monitor.

## 2011-10-25 NOTE — Progress Notes (Signed)
I agree with the following treatment note after reviewing documentation.   [place]ston, Majesty Stehlin Brynn   OTR/L Pager: [phone] Office: [phone] .   

## 2011-10-25 NOTE — Progress Notes (Signed)
Occupational Therapy Evaluation Patient Details Name: Samuel [name] MRN: [id number] DOB: [birth year] Today's Date: 10/25/2011 Time: 4098-1191 OT Time Calculation (min): 21 min  OT Assessment / Plan / Recommendation Clinical Impression  Pt is 78 yo male who presents with ETOH abuse and dizziness. Pt. will benefit from OT to maximize independence and safety in ADLs prior to d/c. OT to follow acutely.     OT Assessment  Patient needs continued OT Services    Follow Up Recommendations  Skilled nursing facility    Barriers to Discharge Decreased caregiver support    Equipment Recommendations  Defer to next venue    Recommendations for Other Services    Frequency  Min 2X/week    Precautions / Restrictions Precautions Precautions: Fall Restrictions Weight Bearing Restrictions: No   Pertinent Vitals/Pain Dizziness throughout session.     ADL  Grooming: Simulated;Set up Where Assessed - Grooming: Unsupported sitting Toilet Transfer: Simulated;Minimal assistance Toilet Transfer Method: Sit to stand Toilet Transfer Equipment: Regular height toilet Equipment Used: Gait belt;Rolling walker ADL Comments: Pt. min A for simulated toilet transfer. Vc's for pt. To stay close to walker when ambulating. Pt. with decreased balance and diifficulty going from sit to stand due to LE weakness. Pt. has limited ROM in both UE's. Pt. able to simulate grooming with Setup A while sitting EOB.Pt. c/o dizziness throughout session    OT Diagnosis: Other (comment);Generalized weakness (dizziness)  OT Problem List: Decreased strength;Decreased activity tolerance;Impaired balance (sitting and/or standing);Decreased knowledge of use of DME or AE;Other (comment) (dizziness) OT Treatment Interventions: Self-care/ADL training;DME and/or AE instruction;Therapeutic activities;Patient/family education;Balance training   OT Goals Acute Rehab OT Goals OT Goal Formulation: With patient Time For Goal Achievement:  11/08/11 Potential to Achieve Goals: Good ADL Goals Pt Will Perform Grooming: Standing at sink;with modified independence ADL Goal: Grooming - Progress: Goal set today Pt Will Perform Upper Body Bathing: with set-up;Sitting at sink ADL Goal: Upper Body Bathing - Progress: Goal set today Pt Will Perform Lower Body Bathing: with set-up;Sit to stand from chair ADL Goal: Lower Body Bathing - Progress: Goal set today Pt Will Perform Upper Body Dressing: with set-up;Sitting, bed;Sitting, chair ADL Goal: Upper Body Dressing - Progress: Goal set today Pt Will Perform Lower Body Dressing: with set-up;Sit to stand from bed;Sit to stand from chair ADL Goal: Lower Body Dressing - Progress: Goal set today Pt Will Transfer to Toilet: with supervision;3-in-1 ADL Goal: Toilet Transfer - Progress: Goal set today Pt Will Perform Toileting - Clothing Manipulation: with supervision;Standing ADL Goal: Toileting - Clothing Manipulation - Progress: Goal set today Pt Will Perform Toileting - Hygiene: with supervision;Sit to stand from 3-in-1/toilet ADL Goal: Toileting - Hygiene - Progress: Goal set today  Visit Information  Last OT Received On: 10/25/11 Assistance Needed: +1    Subjective Data   Pt. Pleasant in session.    Prior Functioning  Vision/Perception  Home Living Lives With: Alone Available Help at Discharge: Family;Available PRN/intermittently Type of Home: Apartment Home Access: Level entry Home Layout: One level Bathroom Shower/Tub: Tub/shower unit;Curtain Bathroom Toilet: Handicapped height Home Adaptive Equipment: Walker - rolling;Wheelchair - manual;Grab bars in shower;Grab bars around toilet;Shower chair without back Additional Comments: Pt. answered questions appropriately but unsure of reliability of answers Prior Function Level of Independence: Independent with assistive device(s) Able to Take Stairs?:  (did not need to navigate stairs) Driving: Yes Vocation: Retired Comments:  Pt was functioning independently but at a low level.  He reports that he was getting up to do his  cooking and take his dog out but chart reports that he was very dirty upon arrival, with feces on feet and hands. Communication Communication: Expressive difficulties (word finding ) Dominant Hand: Right      Cognition  Overall Cognitive Status: No family/caregiver present to determine baseline cognitive functioning Arousal/Alertness: Awake/alert Orientation Level: Appears intact for tasks assessed Behavior During Session: [facility] for tasks performed Cognition - Other Comments: poor historian    Extremity/Trunk Assessment Right Upper Extremity Assessment RUE ROM/Strength/Tone: Deficits RUE ROM/Strength/Tone Deficits: AROM in shoulder approx 100 degrees RUE Sensation: WFL - Light Touch Left Upper Extremity Assessment LUE ROM/Strength/Tone: Deficits LUE ROM/Strength/Tone Deficits: AROM shoulder flexion approx 150 degrees LUE Sensation: WFL - Light Touch Right Lower Extremity Assessment RLE ROM/Strength/Tone: Deficits RLE ROM/Strength/Tone Deficits: knee ext 3/5, hip flex 3+/5, hip ext 3/5 RLE Sensation: WFL - Light Touch RLE Coordination: WFL - gross motor Left Lower Extremity Assessment LLE ROM/Strength/Tone: Deficits LLE ROM/Strength/Tone Deficits: hip flex 3+/5, knee ext 4-/5, hip ext 3/5 LLE Sensation: WFL - Light Touch LLE Coordination: WFL - gross motor Trunk Assessment Trunk Assessment: Other exceptions (scoliosis)   Mobility Bed Mobility Bed Mobility: Supine to Sit;Sitting - Scoot to Edge of Bed;Sit to Supine Supine to Sit: 5: Supervision;HOB elevated;With rails Sitting - Scoot to Edge of Bed: 5: Supervision Sit to Supine: 5: Supervision Scooting to [facility]: With rail;5: Supervision Details for Bed Mobility Assistance: pt able to get self into bed but has difficulty moving within the bed to get legs all the way in and off the footboard.  vc's help pt problem solve getting himself  situated in bed and to use bed rails for more effective mvmt. Transfers Transfers: Sit to Stand;Stand to Sit Sit to Stand: 4: Min assist;With upper extremity assist;From bed Stand to Sit: 4: Min assist;With upper extremity assist;To bed Details for Transfer Assistance: Min A for balance         End of Session OT - End of Session Activity Tolerance: Patient tolerated treatment well Patient left: in bed;with call bell/phone within reach;with bed alarm set Nurse Communication: Other (comment) (bleeding IV)  GO     Maribell Demeo 10/25/2011, 2:13 PM

## 2011-10-25 NOTE — Evaluation (Signed)
Speech Language Pathology Evaluation Patient Details Name: Samuel [name] MRN: [id number] DOB: 06-Jun-1926 Today's Date: 10/25/2011 Time: 7829-5621 SLP Time Calculation (min): 16 min  Problem List:  Patient Active Problem List  Diagnosis  . ALCOHOL ABUSE  . DEPRESSION  . Essential hypertension, benign  . AORTIC STENOSIS  . COPD  . CELLULITIS AND ABSCESS OF FOOT EXCEPT TOES  . DIZZINESS, CHRONIC  . HEADACHE  . HEART MURMUR  . DYSPNEA ON EXERTION  . RLQ PAIN  . DECREASED LIBIDO  . Alcohol intoxication  . Physical deconditioning  . Generalized weakness  . Altered mental status  . Fever and chills  . Dysphagia   Past Medical History:  Past Medical History  Diagnosis Date  . Depression   . GERD (gastroesophageal reflux disease)   . COPD (chronic obstructive pulmonary disease)   . Aortic stenosis   . Essential hypertension, benign   . Decreased libido   . DIZZINESS, CHRONIC   . Alcohol abuse   . Shortness of breath 10/24/11    "all the time"  . Arthritis     "in the low vertebra"  . Chronic lower back pain    Past Surgical History:  Past Surgical History  Procedure Date  . No past surgeries    HPI:  Samuel [name] is an 78 y.o. male Samuel [name] is a gentleman with a history of aortic stenosis as well as alcohol abuse.  He was seen by his cardiologist today for followup and apparently had declined significantly and was not doing well.  He has had progressive weakness and physical deconditioning over the past 2 months.  Reportedly in May 2013 pt came down with a viral infection associated with severe malaise, fever and chills.  He has not been able to get out of bed for the last 4 weeks. He also reports that he has been drinking large amounts of alcohol over that time.  He cannot quantify the amount he has been drinking.  He reports persistent fever and chills and malaise.  He denies chest pain and shortness of breath.  He arrived to the hospital poorly groomed,  significantly weakened and lethargic (possibly under the influence of alcohol) and had feces on his feet and no shoes   Assessment / Plan / Recommendation Clinical Impression  Pt presents with decreased awareness, judgment, likely baseline behavior.  Overall cognition is functional with regard to attention, short-term and long-term memory.  Pt able to discuss various staff who have seen him today and procedures in which he has participated.  No acute abnormalities by CT.  Stable atrophy and small vessel disease. No SLP needs identified - will sign off.      SLP Assessment  Patient does not need any further Speech Language Pathology Services    Follow Up Recommendations    none      Samuel [name] L. Samuel [name], [place] CCC/SLP Pager [phone]  Samuel [name] 10/25/2011, 2:57 PM

## 2011-10-25 NOTE — Progress Notes (Signed)
INITIAL ADULT NUTRITION ASSESSMENT Date: 10/25/2011   Time: 8:32 AM Reason for Assessment: Consult    INTERVENTION: 1. Continue multivitamin and mineral supplements  2. RD will monitor PO intake for supplement needs 3. RD will continue to follow    ASSESSMENT: Male 78 y.o.  Dx: Alcohol abuse   Hx:  Past Medical History  Diagnosis Date  . Depression   . GERD (gastroesophageal reflux disease)   . COPD (chronic obstructive pulmonary disease)   . Aortic stenosis   . Essential hypertension, benign   . Decreased libido   . DIZZINESS, CHRONIC   . Alcohol abuse   . Shortness of breath 10/24/11    "all the time"  . Arthritis     "in the low vertebra"  . Chronic lower back pain     Related Meds:     . amLODipine  10 mg Oral Daily  . aspirin  81 mg Oral Daily  . folic acid  1 mg Oral Daily  . furosemide  40 mg Oral Daily  . heparin subcutaneous  5,000 Units Subcutaneous Q8H  . multivitamin with minerals  1 tablet Oral Daily  . omega-3 acid ethyl esters  1 g Oral Daily  . pantoprazole  40 mg Oral Q0600  . sodium chloride  3 mL Intravenous Q12H  . thiamine  100 mg Oral Daily   Or  . thiamine  100 mg Intravenous Daily  . DISCONTD: furosemide  40 mg Intravenous BID  . DISCONTD: piperacillin-tazobactam (ZOSYN)  IV  3.375 g Intravenous Q8H     Ht: 6\' 1"  (185.4 cm)  Wt: 166 lb 3.6 oz (75.4 kg) (BEDSCALE PT UNABLE TO STAND)  Ideal Wt: 83.6 kg  % Ideal Wt: 90%  Usual Wt: pt unsure Wt Readings from Last 5 Encounters:  10/25/11 166 lb 3.6 oz (75.4 kg)  10/24/11 123 lb (55.792 kg)  07/10/11 123 lb (55.792 kg)  03/06/11 175 lb (79.379 kg)  03/22/10 164 lb (74.39 kg)    % Usual Wt: --  Body mass index is 21.93 kg/(m^2). Pt is WNL per BMI   Food/Nutrition Related Hx: Pt reports decreased intake with nausea on occasion   Labs:  CMP     Component Value Date/Time   NA 142 10/25/2011 0251   K 3.7 10/25/2011 0251   CL 98 10/25/2011 0251   CO2 28 10/25/2011 0251   GLUCOSE 85 10/25/2011 0251   BUN 10 10/25/2011 0251   CREATININE 0.87 10/25/2011 0251   CALCIUM 8.9 10/25/2011 0251   PROT 7.6 10/25/2011 0251   ALBUMIN 3.7 10/25/2011 0251   AST 38* 10/25/2011 0251   ALT 14 10/25/2011 0251   ALKPHOS 64 10/25/2011 0251   BILITOT 0.8 10/25/2011 0251   GFRNONAA 78* 10/25/2011 0251   GFRAA >90 10/25/2011 0251     Intake/Output Summary (Last 24 hours) at 10/25/11 0835 Last data filed at 10/25/11 0357  Gross per 24 hour  Intake      0 ml  Output   2525 ml  Net  -2525 ml      Diet Order: Cardiac  Supplements/Tube Feeding: none  IVF:    DISCONTD: sodium chloride    Estimated Nutritional Needs:   Kcal: 1800-2000 Protein: 75-85 gm  Fluid:  1.8 - 2 L   RD consulted for assessment of nutrition status. Pt with recent increase in alcohol consumption. Reports that he eat regularly, 3 meals daily, and has no problems with access to food. States that while he was "  bed bound" for several weeks he was able to get up and prepare meals or would go out to eat.Pt was not able to provide any examples of food items recently consumed, would provide vague answers to questions or change the subject. Question how much pt was actually eating.  Pt has a daughter who comes over 3 times per week to check on him. Pt does not know what his usual weight is, states he hasn't weighed himself in a very long time.  Per weight hx, pt had weight loss from December 2012 through April 2013, about 52 lb lost (~30%). Since then pt has regained 43 lbs. Unsure if pt has had true weight gain or if partly r/t fluids, as noted to have some mild pitting edema per flow-sheet.    NUTRITION DIAGNOSIS: -Inadequate protein intake (NI-5.7.1).  Status: Ongoing  RELATED TO: increased alcohol consumption   AS EVIDENCE BY: increased blood alcohol content on admission   MONITORING/EVALUATION(Goals): Goal: PO intake of meals to meet >90% of estimated nutrition needs  Monitor: PO intake, weight,  labs  EDUCATION NEEDS: -No education needs identified at this time   DOCUMENTATION CODES Per approved criteria  -Not Applicable   Clarene Duke RD, LDN Pager [phone] After Hours pager [phone]

## 2011-10-26 DIAGNOSIS — R42 Dizziness and giddiness: Secondary | ICD-10-CM

## 2011-10-26 LAB — URINE CULTURE: Culture: NO GROWTH

## 2011-10-26 LAB — BASIC METABOLIC PANEL
BUN: 14 mg/dL (ref 6–23)
CO2: 35 mEq/L — ABNORMAL HIGH (ref 19–32)
Calcium: 8.8 mg/dL (ref 8.4–10.5)
Chloride: 97 mEq/L (ref 96–112)
Creatinine, Ser: 0.99 mg/dL (ref 0.50–1.35)
Glucose, Bld: 77 mg/dL (ref 70–99)

## 2011-10-26 LAB — CBC
HCT: 42.4 % (ref 39.0–52.0)
MCH: 32.7 pg (ref 26.0–34.0)
MCV: 94.2 fL (ref 78.0–100.0)
RBC: 4.5 MIL/uL (ref 4.22–5.81)
WBC: 4.3 10*3/uL (ref 4.0–10.5)

## 2011-10-26 MED ORDER — DIAZEPAM 2 MG PO TABS
2.0000 mg | ORAL_TABLET | ORAL | Status: AC
  Administered 2011-10-27: 2 mg via ORAL
  Filled 2011-10-26: qty 1

## 2011-10-26 MED ORDER — SODIUM CHLORIDE 0.9 % IV SOLN: 250.0000 mL | INTRAVENOUS | Status: DC | PRN

## 2011-10-26 MED ORDER — ASPIRIN 81 MG PO CHEW
324.0000 mg | CHEWABLE_TABLET | ORAL | Status: AC
  Administered 2011-10-27: 324 mg via ORAL

## 2011-10-26 MED ORDER — SODIUM CHLORIDE 0.9 % IJ SOLN: 3.0000 mL | INTRAMUSCULAR | Status: DC | PRN

## 2011-10-26 MED ORDER — SODIUM CHLORIDE 0.9 % IJ SOLN
3.0000 mL | Freq: Two times a day (BID) | INTRAMUSCULAR | Status: DC
  Administered 2011-10-26 (×2): 3 mL via INTRAVENOUS

## 2011-10-26 MED ORDER — SODIUM CHLORIDE 0.9 % IV SOLN
INTRAVENOUS | Status: DC
  Administered 2011-10-27: 05:00:00 via INTRAVENOUS

## 2011-10-26 MED ORDER — POTASSIUM CHLORIDE CRYS ER 20 MEQ PO TBCR
40.0000 meq | EXTENDED_RELEASE_TABLET | Freq: Once | ORAL | Status: AC
  Administered 2011-10-26: 40 meq via ORAL
  Filled 2011-10-26: qty 2

## 2011-10-26 MED ORDER — BIOTENE DRY MOUTH MT LIQD
15.0000 mL | Freq: Two times a day (BID) | OROMUCOSAL | Status: DC
  Administered 2011-10-26 – 2011-10-30 (×7): 15 mL via OROMUCOSAL

## 2011-10-26 NOTE — Progress Notes (Signed)
[facility] PROGRESS NOTE  Samuel DEMELO ZOX:[id number] DOB: [birth year] DOA: 10/24/2011 PCP: Ralene Ok, MD  Assessment/Plan: Active Problems:  ALCOHOL ABUSE  DEPRESSION  Essential hypertension, benign  AORTIC STENOSIS  COPD  DIZZINESS, CHRONIC  HEART MURMUR  DYSPNEA ON EXERTION  Alcohol intoxication  Physical deconditioning  Generalized weakness  Altered mental status  Fever and chills  Dysphagia  1. Aortic Stenosis/heart murmur/CAD - Defer to cardiology at this juncture given current history - Patient may get cath tomorrow  2. ETOH abuse:  -Will monitor closely.  -Currently on CIWA protocol.  -Has not required any lorazepam today.   3. HTN: Last check was 112/49.  But has had relatively well controlled BP's today. Will continue to monitor.   4. Generalized weakness/dyspnea on exertion:  -Could be related to number one.  - F/u with cardiology  5. DVT prophylaxis   Code Status: Full  Family Communication: No family at bedside.  Disposition Plan: Per Cardiology at this juncture  Brief narrative:  Please refer to HPI   Consultants:  Cardiology   Procedures:  2 D echocardiogram   Antibiotics:  None   HPI/Subjective: Patient mentions that he feels weak today.  Otherwise denies any chest pain.  No acute issues overnight.  Objective: Filed Vitals:   10/26/11 0001 10/26/11 0518 10/26/11 1031 10/26/11 1331  BP: 116/60 119/61 123/57 112/49  Pulse: 70 72 67 81  Temp:  98.1 F (36.7 C)  98.4 F (36.9 C)  TempSrc:  Oral  Oral  Resp:  18  16  Height:      Weight:  76.431 kg (168 lb 8 oz)    SpO2:  95%  94%    Intake/Output Summary (Last 24 hours) at 10/26/11 1735 Last data filed at 10/26/11 1700  Gross per 24 hour  Intake    483 ml  Output    550 ml  Net    -67 ml    Exam:   General:  Pt in NAD, A and O x 3 Cardiovascular: systolic murmur, RRR  Respiratory: CTA, no wheezes  Abdomen: soft, nt, nd  Data Reviewed: Basic Metabolic  Panel:  Lab 10/26/11 0520 10/25/11 0251 10/24/11 1935  NA 142 142 141  K 3.2* 3.7 4.4  CL 97 98 101  CO2 35* 28 25  GLUCOSE 77 85 88  BUN 14 10 10   CREATININE 0.99 0.87 0.80  CALCIUM 8.8 8.9 8.5  MG -- -- 1.9  PHOS -- -- 3.8   Liver Function Tests:  Lab 10/25/11 0251 10/24/11 1935  AST 38* 44*  ALT 14 15  ALKPHOS 64 61  BILITOT 0.8 0.5  PROT 7.6 7.6  ALBUMIN 3.7 3.6   No results found for this basename: LIPASE:5,AMYLASE:5 in the last 168 hours  Lab 10/24/11 1902  AMMONIA 23   CBC:  Lab 10/26/11 0520 10/25/11 0251 10/24/11 1935  WBC 4.3 4.9 4.0  NEUTROABS -- -- 1.9  HGB 14.7 15.1 15.1  HCT 42.4 42.6 42.8  MCV 94.2 92.4 93.2  PLT 127* 136* 141*   Cardiac Enzymes:  Lab 10/25/11 1010 10/25/11 0243 10/24/11 1925  CKTOTAL 70 58 74  CKMB 3.1 2.9 2.6  CKMBINDEX -- -- --  TROPONINI <0.30 <0.30 <0.30   BNP (last 3 results)  Basename 10/25/11 0251 10/24/11 1935 12/14/10 0512  PROBNP 537.7* 732.1* 1061.0*   CBG: No results found for this basename: GLUCAP:5 in the last 168 hours  Recent Results (from the past 240 hour(s))  CULTURE, BLOOD (ROUTINE  X 2)     Status: Normal (Preliminary result)   Collection Time   10/24/11  6:45 PM      Component Value Range Status Comment   Specimen Description BLOOD LEFT ARM   Final    Special Requests BOTTLES DRAWN AEROBIC AND ANAEROBIC 10CC   Final    Culture  Setup Time 10/25/2011 02:40   Final    Culture     Final    Value:        BLOOD CULTURE RECEIVED NO GROWTH TO DATE CULTURE WILL BE HELD FOR 5 DAYS BEFORE ISSUING A FINAL NEGATIVE REPORT   Report Status PENDING   Incomplete   CULTURE, BLOOD (ROUTINE X 2)     Status: Normal (Preliminary result)   Collection Time   10/24/11  7:00 PM      Component Value Range Status Comment   Specimen Description BLOOD RIGHT ARM   Final    Special Requests BOTTLES DRAWN AEROBIC AND ANAEROBIC 10CC   Final    Culture  Setup Time 10/25/2011 02:40   Final    Culture     Final    Value:         BLOOD CULTURE RECEIVED NO GROWTH TO DATE CULTURE WILL BE HELD FOR 5 DAYS BEFORE ISSUING A FINAL NEGATIVE REPORT   Report Status PENDING   Incomplete   URINE CULTURE     Status: Normal   Collection Time   10/24/11 10:40 PM      Component Value Range Status Comment   Specimen Description URINE, RANDOM   Final    Special Requests Immunocompromised   Final    Culture  Setup Time 10/25/2011 05:54   Final    Colony Count NO GROWTH   Final    Culture NO GROWTH   Final    Report Status 10/26/2011 FINAL   Final      Studies: Ct Head Wo Contrast  10/24/2011  *RADIOLOGY REPORT*  Clinical Data: Altered mental status.  CT HEAD WITHOUT CONTRAST  Technique:  Contiguous axial images were obtained from the base of the skull through the vertex without contrast.  Comparison: 12/13/2010  Findings: Stable atrophy and mild small vessel disease. The brain demonstrates no evidence of hemorrhage, infarction, edema, mass effect, extra-axial fluid collection, hydrocephalus or mass lesion. The skull is unremarkable.  IMPRESSION: No acute abnormalities by CT.  Stable atrophy and small vessel disease.  Original Report Authenticated By: Reola Calkins, M.D.   Dg Chest Port 1 View  10/24/2011  *RADIOLOGY REPORT*  Clinical Data: Altered mental status  PORTABLE CHEST - 1 VIEW  Comparison: 12/13/2010  Findings: Severe dextroscoliosis in the thoracic spine is unchanged.  Lungs are clear without infiltrate or effusion.  Negative for heart failure or pneumonia.  IMPRESSION: No acute cardiopulmonary disease.  Original Report Authenticated By: Camelia Phenes, M.D.    Scheduled Meds:   . amLODipine  10 mg Oral Daily  . antiseptic oral rinse  15 mL Mouth Rinse BID  . aspirin  324 mg Oral Pre-Cath  . aspirin  81 mg Oral Daily  . diazepam  2 mg Oral On Call  . folic acid  1 mg Oral Daily  . furosemide  40 mg Oral Daily  . heparin subcutaneous  5,000 Units Subcutaneous Q8H  . multivitamin with minerals  1 tablet Oral Daily  .  omega-3 acid ethyl esters  1 g Oral Daily  . pantoprazole  40 mg Oral Q0600  . potassium  chloride  40 mEq Oral Once  . sodium chloride  3 mL Intravenous Q12H  . sodium chloride  3 mL Intravenous Q12H  . thiamine  100 mg Oral Daily   Or  . thiamine  100 mg Intravenous Daily   Continuous Infusions:   . sodium chloride      Active Problems:  ALCOHOL ABUSE  DEPRESSION  Essential hypertension, benign  AORTIC STENOSIS  COPD  DIZZINESS, CHRONIC  HEART MURMUR  DYSPNEA ON EXERTION  Alcohol intoxication  Physical deconditioning  Generalized weakness  Altered mental status  Fever and chills  Dysphagia    Time spent: < 30 minutes    Penny [name]  [facility] Pager [phone] If 8PM-8AM, please contact night-coverage at [email or web address], password [facility] 10/26/2011, 5:35 PM  LOS: 2 days

## 2011-10-26 NOTE — Progress Notes (Signed)
Clinical Social Work Department BRIEF PSYCHOSOCIAL ASSESSMENT 10/26/2011  Patient:  Samuel [name], Samuel [name]     Account Number:  [id number]     Admit date:  10/24/2011  Clinical Social Worker:  Juliette Mangle  Date/Time:  10/26/2011 02:44 PM  Referred by:  RN  Date Referred:  10/25/2011 Referred for  Substance Abuse  SNF Placement   Other Referral:   Interview type:  Patient Other interview type:    PSYCHOSOCIAL DATA Living Status:  ALONE Admitted from facility:   Level of care:   Primary support name:  Leola Brazil Primary support relationship to patient:  CHILD, ADULT Degree of support available:   fairly strong    CURRENT CONCERNS Current Concerns  Substance Abuse  Post-Acute Placement   Other Concerns:    SOCIAL WORK ASSESSMENT / PLAN CSW received a referral to assess patient for his ETOH abuse. CSW also noticed that PT recommended that patient have 24 hour supervision or go to a SNF. CSW met with patient at bedside and introduced self, explained role and discussed with patient his ETOH use as well as his interest in going to a SNF for Rehabilitation. Patient reported that he quit drinking two weeks ago. He reported that when he was drinking he was drinking beer/wine daily and drinking approximately 6 drinks/day. Patient reports that he quit drinking  for months but started back drinking about several months ago. CSW offered patient SA resources but patient reported that he had all of the information/resources he needs and he is "quiting(drinking) for good". CSW reinforced how difficult it is to quit and encouraged patient to consider SA treatment. Patient expressed no interest in this.  CSW then talked to patient about possibly going to a SNF for rehab.  Patient reported, "I would rather die than go to a facility". Patient's daughter has taken off next week to be able to care for her father 24 hours/day. CSW will f/u with patient's daughter to discuss this.  CSW will continue to  follow and assist with all d/c needs.   Assessment/plan status:  Psychosocial Support/Ongoing Assessment of Needs Other assessment/ plan:  CSW administered SA assessment tool Information/referral to community resources:   Patient did not want community SA resources    PATIENT'S/FAMILY'S RESPONSE TO PLAN OF CARE: Patient was appreciative of information and support provided by CSW. CSW will f/u with patient's daughter and will continue to follow and assist with any d/c needs.   Sabino Niemann, MSW, [employer] [phone]

## 2011-10-26 NOTE — Progress Notes (Signed)
Physical Therapy Treatment Patient Details Name: Samuel [name] MRN: [id number] DOB: February 18, 1927 Today's Date: 10/26/2011 Time: 4132-4401 PT Time Calculation (min): 19 min  PT Assessment / Plan / Recommendation Comments on Treatment Session  Pt made good progress with mobility today.  Able to ambulate- no dizziness reported.  Balance not formally tested today but did require min assist as he was standing at sink cleaning his dentures due to posterior lean.      Follow Up Recommendations  Skilled nursing facility;Supervision for mobility/OOB    Barriers to Discharge        Equipment Recommendations  Defer to next venue    Recommendations for Other Services OT consult  Frequency Min 3X/week   Plan Discharge plan remains appropriate    Precautions / Restrictions Precautions Precautions: Fall Restrictions Weight Bearing Restrictions: No    Pertinent Vitals/Pain No pain reported    Mobility  Bed Mobility Bed Mobility: Supine to Sit;Sitting - Scoot to Edge of Bed Supine to Sit: HOB flat;With rails;4: Min assist Sitting - Scoot to Edge of Bed: 5: Supervision Details for Bed Mobility Assistance: Cues for sequencing, use of UE's to increase ease of transition, & technique.  Minor assist to lift shoulers/trunk to sitting upright.  Transfers Transfers: Sit to Stand;Stand to Sit Sit to Stand: 4: Min guard;With upper extremity assist;With armrests;From bed;From chair/3-in-1;From toilet Stand to Sit: 4: Min guard;With upper extremity assist;With armrests;To chair/3-in-1;To toilet Details for Transfer Assistance: Cues for safe hand placement, body positioning before sitting, use of UE's to control descent.  Pt performed 6x's for strengthening, activity tolerance, & instruction.   Ambulation/Gait Ambulation/Gait Assistance: 4: Min assist Ambulation Distance (Feet): 100 Feet Assistive device: Rolling walker Ambulation/Gait Assistance Details: Minor assist for balance & safety.  Pt with  mild posterior lean.  Cues for safe use of RW, tall posture, stay inside RW, increase step/stride length, & to look up for increased awareness of environment.   Gait Pattern: Step-through pattern;Decreased stride length;Decreased hip/knee flexion - right;Decreased hip/knee flexion - left Stairs: No Wheelchair Mobility Wheelchair Mobility: No     PT Goals Acute Rehab PT Goals Time For Goal Achievement: 11/08/11 Potential to Achieve Goals: Fair PT Goal: Supine/Side to Sit - Progress: Progressing toward goal PT Goal: Sit to Stand - Progress: Progressing toward goal PT Goal: Stand to Sit - Progress: Progressing toward goal PT Goal: Ambulate - Progress: Progressing toward goal  Visit Information  Last PT Received On: 10/26/11 Assistance Needed: +1    Subjective Data  Subjective: I need to clean my dentures   Cognition  Overall Cognitive Status: No family/caregiver present to determine baseline cognitive functioning Arousal/Alertness: Awake/alert Orientation Level: Appears intact for tasks assessed Behavior During Session: [facility] for tasks performed Cognition - Other Comments: poor historian    Balance  Balance Balance Assessed:  (not formally)  End of Session PT - End of Session Equipment Utilized During Treatment: Gait belt Activity Tolerance: Patient tolerated treatment well Patient left: in chair;with call bell/phone within reach;with nursing in room Nurse Communication: Mobility status;Other (comment) (encouragement for increased activity)    Verdell Face, [place] [phone] 10/26/2011

## 2011-10-26 NOTE — Progress Notes (Addendum)
CSW confronted patient about his ETOH level upon admission. Patient reported that he has been drinking the past couple weeks and he reported feeling very remorseful for not being forthcoming about his ETOH use. Patient reported that he may be interested in SA resources. CSW will f/u with patient in the am. Sabino Niemann, MSW, [employer] [phone]

## 2011-10-26 NOTE — Progress Notes (Signed)
@   Subjective:  Denies CP or dyspnea; weak   Objective:  Filed Vitals:   10/25/11 1312 10/25/11 2212 10/26/11 0001 10/26/11 0518  BP: 131/63 119/54 116/60 119/61  Pulse: 87 72 70 72  Temp: 97.6 F (36.4 C) 97.9 F (36.6 C)  98.1 F (36.7 C)  TempSrc:  Oral  Oral  Resp: 20 20  18   Height:      Weight:    76.431 kg (168 lb 8 oz)  SpO2: 96% 93%  95%    Intake/Output from previous day:  Intake/Output Summary (Last 24 hours) at 10/26/11 0749 Last data filed at 10/26/11 0518  Gross per 24 hour  Intake    843 ml  Output   1452 ml  Net   -609 ml    Physical Exam: Physical exam: Well-developed chronically ill appearing in no acute distress.  Skin is warm and dry.  HEENT is normal.  Neck is supple.  Chest is clear to auscultation with normal expansion.  Cardiovascular exam is regular rate and rhythm. 3/6 systolic murmur LSB Abdominal exam nontender or distended. No masses palpated. Extremities show no edema. neuro grossly intact    Lab Results: Basic Metabolic Panel:  Basename 10/26/11 0520 10/25/11 0251 10/24/11 1935  NA 142 142 --  K 3.2* 3.7 --  CL 97 98 --  CO2 35* 28 --  GLUCOSE 77 85 --  BUN 14 10 --  CREATININE 0.99 0.87 --  CALCIUM 8.8 8.9 --  MG -- -- 1.9  PHOS -- -- 3.8   CBC:  Basename 10/26/11 0520 10/25/11 0251 10/24/11 1935  WBC 4.3 4.9 --  NEUTROABS -- -- 1.9  HGB 14.7 15.1 --  HCT 42.4 42.6 --  MCV 94.2 92.4 --  PLT 127* 136* --   Cardiac Enzymes:  Basename 10/25/11 1010 10/25/11 0243 10/24/11 1925  CKTOTAL 70 58 74  CKMB 3.1 2.9 2.6  CKMBINDEX -- -- --  TROPONINI <0.30 <0.30 <0.30     Assessment/Plan:  1) Aortic stenosis - echo suggests moderate AS but most likely underestimated as previous echos with much higher gradients; continue lasix  40 mg po daily; follow renal function; supplement K; plan right and left cath in AM and consideration of AVR; if felt not to be a surgical candidate, eval for TAVR. Will ask PT to see to help  with strength. 2) ETOH abuse - I am concerned about possibility of withdrawal, follow closely 3) HTN-BP controlled 4) COPD - plan PFTs when volume status optimized. 5) Previous fever - afebrile; blood cultures pending; UA neg.  Olga Millers 10/26/2011, 7:49 AM

## 2011-10-26 NOTE — Progress Notes (Signed)
Utilization review completed.  

## 2011-10-27 ENCOUNTER — Encounter (HOSPITAL_COMMUNITY)
Admission: AD | Disposition: A | Discharge status: Home / Self Care | Payer: Self-pay | Source: Ambulatory Visit | Attending: Family Medicine

## 2011-10-27 DIAGNOSIS — I359 Nonrheumatic aortic valve disorder, unspecified: Secondary | ICD-10-CM

## 2011-10-27 HISTORY — PX: LEFT AND RIGHT HEART CATHETERIZATION WITH CORONARY ANGIOGRAM: SHX5449

## 2011-10-27 LAB — POCT ACTIVATED CLOTTING TIME: Activated Clotting Time: 160 seconds

## 2011-10-27 LAB — BASIC METABOLIC PANEL
BUN: 11 mg/dL (ref 6–23)
CO2: 34 mEq/L — ABNORMAL HIGH (ref 19–32)
Calcium: 8.6 mg/dL (ref 8.4–10.5)
Creatinine, Ser: 0.92 mg/dL (ref 0.50–1.35)
Glucose, Bld: 107 mg/dL — ABNORMAL HIGH (ref 70–99)

## 2011-10-27 SURGERY — LEFT AND RIGHT HEART CATHETERIZATION WITH CORONARY ANGIOGRAM
Anesthesia: LOCAL

## 2011-10-27 MED ORDER — HEPARIN (PORCINE) IN NACL 2-0.9 UNIT/ML-% IJ SOLN
INTRAMUSCULAR | Status: AC
  Filled 2011-10-27: qty 2000

## 2011-10-27 MED ORDER — NITROGLYCERIN 0.2 MG/ML ON CALL CATH LAB
INTRAVENOUS | Status: AC
  Filled 2011-10-27: qty 1

## 2011-10-27 MED ORDER — ACETAMINOPHEN 325 MG PO TABS: 650.0000 mg | ORAL_TABLET | ORAL | Status: DC | PRN

## 2011-10-27 MED ORDER — LIDOCAINE HCL (PF) 1 % IJ SOLN
INTRAMUSCULAR | Status: AC
  Filled 2011-10-27: qty 30

## 2011-10-27 MED ORDER — SODIUM CHLORIDE 0.9 % IV SOLN
INTRAVENOUS | Status: AC
  Administered 2011-10-27: 12:00:00 via INTRAVENOUS

## 2011-10-27 NOTE — H&P (View-Only) (Signed)
@   Subjective:  Denies CP or dyspnea; strength improving   Objective:  Filed Vitals:   10/26/11 1331 10/26/11 1800 10/26/11 2100 10/27/11 0458  BP: 112/49 146/76 136/65 102/58  Pulse: 81 72 80 74  Temp: 98.4 F (36.9 C)  97 F (36.1 C) 97.5 F (36.4 C)  TempSrc: Oral  Oral Oral  Resp: 16  18 18   Height:      Weight:    74.707 kg (164 lb 11.2 oz)  SpO2: 94%  97% 95%    Intake/Output from previous day:  Intake/Output Summary (Last 24 hours) at 10/27/11 0742 Last data filed at 10/27/11 0500  Gross per 24 hour  Intake    720 ml  Output    325 ml  Net    395 ml    Physical Exam: Physical exam: Well-developed chronically ill appearing in no acute distress.  Skin is warm and dry.  HEENT is normal.  Neck is supple.  Chest is clear to auscultation with normal expansion.  Cardiovascular exam is regular rate and rhythm. 3/6 systolic murmur LSB Abdominal exam nontender or distended. No masses palpated. Extremities show no edema. neuro grossly intact    Lab Results: Basic Metabolic Panel:  Basename 10/26/11 0520 10/25/11 0251 10/24/11 1935  NA 142 142 --  K 3.2* 3.7 --  CL 97 98 --  CO2 35* 28 --  GLUCOSE 77 85 --  BUN 14 10 --  CREATININE 0.99 0.87 --  CALCIUM 8.8 8.9 --  MG -- -- 1.9  PHOS -- -- 3.8   CBC:  Basename 10/26/11 0520 10/25/11 0251 10/24/11 1935  WBC 4.3 4.9 --  NEUTROABS -- -- 1.9  HGB 14.7 15.1 --  HCT 42.4 42.6 --  MCV 94.2 92.4 --  PLT 127* 136* --   Cardiac Enzymes:  Basename 10/25/11 1010 10/25/11 0243 10/24/11 1925  CKTOTAL 70 58 74  CKMB 3.1 2.9 2.6  CKMBINDEX -- -- --  TROPONINI <0.30 <0.30 <0.30     Assessment/Plan:  1) Aortic stenosis - echo suggests moderate AS but most likely underestimated as previous echos with much higher gradients (echo 12-12 with mean gradient of 57 mmHg and peak velocity of 4.8 m/s); continue lasix  40 mg po daily; follow renal function; supplement K; plan right and left cath today and consideration of  AVR; if felt not to be a surgical candidate, eval for TAVR. Continue PT 2) ETOH abuse - No withdrawal at this point. 3) HTN-BP controlled 4) COPD - plan PFTs when volume status optimized. 5) Previous fever - afebrile; blood cultures pending; UA neg.  Olga Millers 10/27/2011, 7:42 AM

## 2011-10-27 NOTE — CV Procedure (Signed)
   Cardiac Catheterization Procedure Note  Name: Samuel [name] MRN: [id number] DOB: May 23, 1926  Procedure: Right Heart Cath, Placement of catheters without left heart cath, Selective Coronary Angiography, aortic root angiography  Indication: Aortic stenosis   Procedural Details: The right groin was prepped, draped, and anesthetized with 1% lidocaine. Using the modified Seldinger technique a 6 French sheath was placed in the right femoral artery and a 7 French sheath was placed in the right femoral vein. A Swan-Ganz catheter was used for the right heart catheterization.  We were unable to get up beyond the RA due to marked scoliosis.  Only RA pressure was measured.  We did a hand injection.   Standard Judkins catheters were used for selective coronary angiography and aortic root, but was difficult due to the tortuosity. . There were no immediate procedural complications. The patient was transferred to the post catheterization recovery area for further monitoring.  Procedural Findings: Hemodynamics RA 9 (?) if RA RV not done PA not done PCWP not done LV not done AO 146/63 (97)  Oxygen saturations: PA not done AO 94% on O2  Cardiac Output (Fick) not done   Cardiac Index (Fick) not done   Coronary angiography: Coronary dominance: right  Aortic root demonstrated a high calcified valve with minimal mobility.  There was at least moderate AR, graded at 2-3 plus.   Left mainstem: short no obstruction  Left anterior descending (LAD): Mild irregularity without obstruction  Left circumflex (LCx): Minor luminal irregularity without obstruction.   Right coronary artery (RCA): Large caliber vessel with mild proximal irregularity, but no critical disease.  Large PDA and PLA.  Left ventriculography: Left ventricular systolic function is normal, LVEF is estimated at 55-65%, there is no significant mitral regurgitation   Final Conclusions:   1.  Severe AS by multiple echoes, with AI by  angio 2.  No significant coronary obstruction 3.  Unable to get to RA from the femoral approach due to severe scoliosis Recommendations:  1.  Spoke with Dr. Elpidio Galea surgical consult 2.  RHC from IJ with doppler if needed 3.  ? AVR versus TAVR consideration.  The latter would likely require transapical or more central approach.  See catheter orientation on fluoro.     [name] 10/27/2011, 10:32 AM

## 2011-10-27 NOTE — Progress Notes (Signed)
I cosign for Samuel [name]'s assessment, med administration, notes, I/O, and care plan education. 

## 2011-10-27 NOTE — Progress Notes (Signed)
CSW provided patient with SA resources. Patient reported that he might start attending AA again. Clinical Social Worker will sign off for now as social work intervention is no longer needed. Please consult [place] again if new need arises.   Sabino Niemann, MSW, [employer] [phone]

## 2011-10-27 NOTE — Progress Notes (Signed)
Cosigned for [employer] RN's assessment, medication administration, I/O, care plan/education and vital signs  D. Manson Passey RN

## 2011-10-27 NOTE — Progress Notes (Signed)
OT Cancellation Note  Treatment cancelled today due to medical issues with patient which prohibited therapy--patient s/p cardiac cath is on bedrest.  Samuel [name] [phone] 10/27/2011, 11:54 AM

## 2011-10-27 NOTE — Progress Notes (Signed)
Patient walked with Physical Therapy after bedrest was up.  Vascular site has been assessed and site is not bleeding, bandaid has been applied    D. Manson Passey RN

## 2011-10-27 NOTE — Progress Notes (Signed)
Physical Therapy Treatment Patient Details Name: Samuel [name] MRN: [id number] DOB: 1926/11/28 Today's Date: 10/27/2011 Time: 2956-2130 PT Time Calculation (min): 26 min  PT Assessment / Plan / Recommendation Comments on Treatment Session  Patient moving well from bed to sitting.  Ambulates well with RW in hallway.  Somewhat impulsive and with decreased safety judgement (wanted to adjust RW to higher position while he was still standing).    Follow Up Recommendations  Skilled nursing facility    Barriers to Discharge        Equipment Recommendations  Defer to next venue    Recommendations for Other Services    Frequency Min 3X/week   Plan Discharge plan remains appropriate    Precautions / Restrictions Precautions Precautions: Fall Precaution Comments: Very impulsive and unsafe Restrictions Weight Bearing Restrictions: No       Mobility  Bed Mobility Bed Mobility: Supine to Sit;Sitting - Scoot to Edge of Bed Supine to Sit: 4: Min assist;HOB flat;With rails Sitting - Scoot to Edge of Bed: 5: Supervision Details for Bed Mobility Assistance: Assist for safety when moving to edge of bed.  Cues for use of rail to assist with transition Transfers Transfers: Sit to Stand;Stand to Sit Sit to Stand: 4: Min guard;With upper extremity assist;From bed;From toilet Stand to Sit: 4: Min guard;With upper extremity assist;With armrests;To chair/3-in-1;To toilet Details for Transfer Assistance: cues for safe hand placement.  Cues to wait for assistance before standing from toilet. Ambulation/Gait Ambulation/Gait Assistance: 4: Min assist Ambulation Distance (Feet): 160 Feet Assistive device: Rolling walker Ambulation/Gait Assistance Details: Cues to keep RW close to body and stay inside RW during gait.  Repeated cues to keep RW with him when moving from bathroom to sink and when going to chair to sit down - he pushed the walker to the side and attempted to walk without it. Gait Pattern:  Step-through pattern;Decreased stride length;Trunk flexed      PT Goals Acute Rehab PT Goals PT Goal: Supine/Side to Sit - Progress: Progressing toward goal PT Goal: Sit to Stand - Progress: Progressing toward goal PT Goal: Stand to Sit - Progress: Progressing toward goal PT Goal: Ambulate - Progress: Progressing toward goal  Visit Information  Last PT Received On: 10/27/11 Assistance Needed: +1    Subjective Data  Subjective: Very talkative.  "I need to raise that walker up."   Cognition  Overall Cognitive Status: No family/caregiver present to determine baseline cognitive functioning Arousal/Alertness: Awake/alert Orientation Level: Appears intact for tasks assessed Behavior During Session: Restless Cognition - Other Comments: Impulsive.  Multiple verbal cues for safety needed going into and coming out of bathroom.      Balance     End of Session PT - End of Session Equipment Utilized During Treatment: Gait belt Activity Tolerance: Patient tolerated treatment well Patient left: in chair;with call bell/phone within reach Nurse Communication: Mobility status   GP     Vena Austria 10/27/2011, 5:57 PM Durenda Hurt. Renaldo Fiddler, [facility] Acute Rehab Services Pager [phone]

## 2011-10-27 NOTE — Progress Notes (Signed)
@   Subjective:  Denies CP or dyspnea; strength improving   Objective:  Filed Vitals:   10/26/11 1331 10/26/11 1800 10/26/11 2100 10/27/11 0458  BP: 112/49 146/76 136/65 102/58  Pulse: 81 72 80 74  Temp: 98.4 F (36.9 C)  97 F (36.1 C) 97.5 F (36.4 C)  TempSrc: Oral  Oral Oral  Resp: 16  18 18  Height:      Weight:    74.707 kg (164 lb 11.2 oz)  SpO2: 94%  97% 95%    Intake/Output from previous day:  Intake/Output Summary (Last 24 hours) at 10/27/11 0742 Last data filed at 10/27/11 0500  Gross per 24 hour  Intake    720 ml  Output    325 ml  Net    395 ml    Physical Exam: Physical exam: Well-developed chronically ill appearing in no acute distress.  Skin is warm and dry.  HEENT is normal.  Neck is supple.  Chest is clear to auscultation with normal expansion.  Cardiovascular exam is regular rate and rhythm. 3/6 systolic murmur LSB Abdominal exam nontender or distended. No masses palpated. Extremities show no edema. neuro grossly intact    Lab Results: Basic Metabolic Panel:  Basename 10/26/11 0520 10/25/11 0251 10/24/11 1935  NA 142 142 --  K 3.2* 3.7 --  CL 97 98 --  CO2 35* 28 --  GLUCOSE 77 85 --  BUN 14 10 --  CREATININE 0.99 0.87 --  CALCIUM 8.8 8.9 --  MG -- -- 1.9  PHOS -- -- 3.8   CBC:  Basename 10/26/11 0520 10/25/11 0251 10/24/11 1935  WBC 4.3 4.9 --  NEUTROABS -- -- 1.9  HGB 14.7 15.1 --  HCT 42.4 42.6 --  MCV 94.2 92.4 --  PLT 127* 136* --   Cardiac Enzymes:  Basename 10/25/11 1010 10/25/11 0243 10/24/11 1925  CKTOTAL 70 58 74  CKMB 3.1 2.9 2.6  CKMBINDEX -- -- --  TROPONINI <0.30 <0.30 <0.30     Assessment/Plan:  1) Aortic stenosis - echo suggests moderate AS but most likely underestimated as previous echos with much higher gradients (echo 12-12 with mean gradient of 57 mmHg and peak velocity of 4.8 m/s); continue lasix  40 mg po daily; follow renal function; supplement K; plan right and left cath today and consideration of  AVR; if felt not to be a surgical candidate, eval for TAVR. Continue PT 2) ETOH abuse - No withdrawal at this point. 3) HTN-BP controlled 4) COPD - plan PFTs when volume status optimized. 5) Previous fever - afebrile; blood cultures pending; UA neg.  Ambar Raphael 10/27/2011, 7:42 AM    

## 2011-10-27 NOTE — Progress Notes (Signed)
[facility] PROGRESS NOTE  Samuel [name] ZOX:[id number] DOB: June 07, 1926 DOA: 10/24/2011 PCP: Ralene Ok, MD  Assessment/Plan: Active Problems:  ALCOHOL ABUSE  DEPRESSION  Essential hypertension, benign  AORTIC STENOSIS  COPD  DIZZINESS, CHRONIC  HEART MURMUR  DYSPNEA ON EXERTION  Alcohol intoxication  Physical deconditioning  Generalized weakness  Altered mental status  Fever and chills  Dysphagia  1. Aortic Stenosis/heart murmur/CAD - Defer to cardiology at this juncture given current history. Surgeon to evaluate patient further given recent cath results. - Will follow up on decisions made by heart specialist and surgeon.  Patient aware of current situation.  2. ETOH abuse:  -Will monitor closely.  -Currently on CIWA protocol.  -Patient did get Valium but it was for cath today and not withdrawal.   - Will continue Lorazepam per protocol.   - No visible anxiety on exam today.  3. HTN: Last check was 141/96. But has had relatively well controlled BP's today. Will continue to monitor.   4. Generalized weakness/dyspnea on exertion:  -Likely related to number one.  - F/u with cardiology   5. DVT prophylaxis   Code Status: Full  Family Communication: No family at bedside.  Disposition Plan: Per Cardiology at this juncture   Brief narrative:  Please refer to HPI  Consultants:  Cardiology Procedures:  2 D echocardiogram Heart Catheterizaion Antibiotics:  None    HPI/Subjective: Patient has no acute complaints currently.  No issues reported overnight.  Denies any headaches, chills, nausea, fever, chills, anxiety  Objective: Filed Vitals:   10/27/11 1245 10/27/11 1300 10/27/11 1311 10/27/11 1330  BP: 124/72 134/80  141/96  Pulse: 86 84  88  Temp:   98.1 F (36.7 C)   TempSrc:   Oral   Resp: 18 18  18   Height:      Weight:      SpO2:  94%      Intake/Output Summary (Last 24 hours) at 10/27/11 1505 Last data filed at 10/27/11 1459  Gross per  24 hour  Intake 304.33 ml  Output    876 ml  Net -571.67 ml    Exam:  General: Pt in NAD, A and O x 3             Cardiovascular: systolic murmur, RRR  Respiratory: CTA, no wheezes  Abdomen: soft, nt, nd  Data Reviewed: Basic Metabolic Panel:  Lab 10/27/11 8119 10/26/11 0520 10/25/11 0251 10/24/11 1935  NA 141 142 142 141  K 3.6 3.2* 3.7 4.4  CL 100 97 98 101  CO2 34* 35* 28 25  GLUCOSE 107* 77 85 88  BUN 11 14 10 10   CREATININE 0.92 0.99 0.87 0.80  CALCIUM 8.6 8.8 8.9 8.5  MG -- -- -- 1.9  PHOS -- -- -- 3.8   Liver Function Tests:  Lab 10/25/11 0251 10/24/11 1935  AST 38* 44*  ALT 14 15  ALKPHOS 64 61  BILITOT 0.8 0.5  PROT 7.6 7.6  ALBUMIN 3.7 3.6   No results found for this basename: LIPASE:5,AMYLASE:5 in the last 168 hours  Lab 10/24/11 1902  AMMONIA 23   CBC:  Lab 10/26/11 0520 10/25/11 0251 10/24/11 1935  WBC 4.3 4.9 4.0  NEUTROABS -- -- 1.9  HGB 14.7 15.1 15.1  HCT 42.4 42.6 42.8  MCV 94.2 92.4 93.2  PLT 127* 136* 141*   Cardiac Enzymes:  Lab 10/25/11 1010 10/25/11 0243 10/24/11 1925  CKTOTAL 70 58 74  CKMB 3.1 2.9 2.6  CKMBINDEX -- -- --  TROPONINI <0.30 <0.30 <0.30   BNP (last 3 results)  Basename 10/25/11 0251 10/24/11 1935 12/14/10 0512  PROBNP 537.7* 732.1* 1061.0*   CBG: No results found for this basename: GLUCAP:5 in the last 168 hours  Recent Results (from the past 240 hour(s))  CULTURE, BLOOD (ROUTINE X 2)     Status: Normal (Preliminary result)   Collection Time   10/24/11  6:45 PM      Component Value Range Status Comment   Specimen Description BLOOD LEFT ARM   Final    Special Requests BOTTLES DRAWN AEROBIC AND ANAEROBIC 10CC   Final    Culture  Setup Time 10/25/2011 02:40   Final    Culture     Final    Value:        BLOOD CULTURE RECEIVED NO GROWTH TO DATE CULTURE WILL BE HELD FOR 5 DAYS BEFORE ISSUING A FINAL NEGATIVE REPORT   Report Status PENDING   Incomplete   CULTURE, BLOOD (ROUTINE X 2)     Status: Normal  (Preliminary result)   Collection Time   10/24/11  7:00 PM      Component Value Range Status Comment   Specimen Description BLOOD RIGHT ARM   Final    Special Requests BOTTLES DRAWN AEROBIC AND ANAEROBIC 10CC   Final    Culture  Setup Time 10/25/2011 02:40   Final    Culture     Final    Value:        BLOOD CULTURE RECEIVED NO GROWTH TO DATE CULTURE WILL BE HELD FOR 5 DAYS BEFORE ISSUING A FINAL NEGATIVE REPORT   Report Status PENDING   Incomplete   URINE CULTURE     Status: Normal   Collection Time   10/24/11 10:40 PM      Component Value Range Status Comment   Specimen Description URINE, RANDOM   Final    Special Requests Immunocompromised   Final    Culture  Setup Time 10/25/2011 05:54   Final    Colony Count NO GROWTH   Final    Culture NO GROWTH   Final    Report Status 10/26/2011 FINAL   Final      Studies: Ct Head Wo Contrast  10/24/2011  *RADIOLOGY REPORT*  Clinical Data: Altered mental status.  CT HEAD WITHOUT CONTRAST  Technique:  Contiguous axial images were obtained from the base of the skull through the vertex without contrast.  Comparison: 12/13/2010  Findings: Stable atrophy and mild small vessel disease. The brain demonstrates no evidence of hemorrhage, infarction, edema, mass effect, extra-axial fluid collection, hydrocephalus or mass lesion. The skull is unremarkable.  IMPRESSION: No acute abnormalities by CT.  Stable atrophy and small vessel disease.  Original Report Authenticated By: Reola Calkins, M.D.   Dg Chest Port 1 View  10/24/2011  *RADIOLOGY REPORT*  Clinical Data: Altered mental status  PORTABLE CHEST - 1 VIEW  Comparison: 12/13/2010  Findings: Severe dextroscoliosis in the thoracic spine is unchanged.  Lungs are clear without infiltrate or effusion.  Negative for heart failure or pneumonia.  IMPRESSION: No acute cardiopulmonary disease.  Original Report Authenticated By: Camelia Phenes, M.D.    Scheduled Meds:   . amLODipine  10 mg Oral Daily  .  antiseptic oral rinse  15 mL Mouth Rinse BID  . aspirin  324 mg Oral Pre-Cath  . aspirin  81 mg Oral Daily  . diazepam  2 mg Oral On Call  . folic acid  1 mg Oral  Daily  . furosemide  40 mg Oral Daily  . heparin      . lidocaine      . multivitamin with minerals  1 tablet Oral Daily  . nitroGLYCERIN      . omega-3 acid ethyl esters  1 g Oral Daily  . pantoprazole  40 mg Oral Q0600  . sodium chloride  3 mL Intravenous Q12H  . thiamine  100 mg Oral Daily   Or  . thiamine  100 mg Intravenous Daily  . DISCONTD: heparin subcutaneous  5,000 Units Subcutaneous Q8H  . DISCONTD: sodium chloride  3 mL Intravenous Q12H   Continuous Infusions:   . sodium chloride 125 mL/hr at 10/27/11 1200  . DISCONTD: sodium chloride 20 mL/hr at 10/27/11 0865    Active Problems:  ALCOHOL ABUSE  DEPRESSION  Essential hypertension, benign  AORTIC STENOSIS  COPD  DIZZINESS, CHRONIC  HEART MURMUR  DYSPNEA ON EXERTION  Alcohol intoxication  Physical deconditioning  Generalized weakness  Altered mental status  Fever and chills  Dysphagia    Time spent: >35 minutes    Penny Pia  [facility] Pager [phone]. If 8PM-8AM, please contact night-coverage at [email or web address], password [facility] 10/27/2011, 3:05 PM  LOS: 3 days

## 2011-10-27 NOTE — Interval H&P Note (Signed)
History and Physical Interval Note:  10/27/2011 9:19 AM  Samuel [name]  has presented today for surgery, with the diagnosis of nstemi  The various methods of treatment have been discussed with the patient. After consideration of risks, benefits and other options for treatment, the patient has consented to  Procedure(s) (LRB): LEFT AND RIGHT HEART CATHETERIZATION WITH CORONARY ANGIOGRAM (N/A) as a surgical intervention .  The patient's history has been reviewed, patient examined, no change in status, stable for surgery.  I have reviewed the patient's chart and labs.  Questions were answered to the patient's satisfaction.     [name]

## 2011-10-28 ENCOUNTER — Encounter (HOSPITAL_COMMUNITY): Payer: Self-pay | Admitting: Thoracic Surgery (Cardiothoracic Vascular Surgery)

## 2011-10-28 DIAGNOSIS — I359 Nonrheumatic aortic valve disorder, unspecified: Secondary | ICD-10-CM

## 2011-10-28 DIAGNOSIS — M419 Scoliosis, unspecified: Secondary | ICD-10-CM

## 2011-10-28 HISTORY — DX: Scoliosis, unspecified: M41.9

## 2011-10-28 LAB — BASIC METABOLIC PANEL
CO2: 34 mEq/L — ABNORMAL HIGH (ref 19–32)
Calcium: 8.6 mg/dL (ref 8.4–10.5)
Chloride: 100 mEq/L (ref 96–112)
Creatinine, Ser: 0.87 mg/dL (ref 0.50–1.35)
GFR calc Af Amer: >90 mL/min (ref >90)
Sodium: 141 mEq/L (ref 135–145)

## 2011-10-28 MED ORDER — ALPRAZOLAM 0.5 MG PO TABS
0.5000 mg | ORAL_TABLET | Freq: Once | ORAL | Status: AC
  Administered 2011-10-28: 0.5 mg via ORAL
  Filled 2011-10-28: qty 1

## 2011-10-28 NOTE — Consult Note (Addendum)
CARDIOTHORACIC SURGERY CONSULTATION REPORT  PCP is Ralene Ok, MD Referring Provider is [name], MD Primary Cardiologist is Olga Millers, MD   Reason for consultation:  Severe symptomatic aortic stenosis  HPI:  Patient is an 78 year old male with known history of severe aortic stenosis and alcohol abuse who has been followed for several years by Dr. Jens Som.  He was last seen routinely in the office by Dr. Jens Som in December 2012 at which time the patient was counseled regarding the indications for elective aortic valve replacement. Apparently arrangements were made for the patient have outpatient cardiac catheterization and subsequent referral for surgical consultation, but the patient canceled both and declined to proceed with any further evaluation at that time. He presented to Dr. Ludwig Clarks office on July 23 feeling poorly with progressive exertional shortness of breath, fever, chills, and generalized malaise. He thought he had been suffering from a virus. He was clearly intoxicated. He was sent directly to the hospital for admission. Blood alcohol levels were significantly elevated. Followup echocardiogram was performed reportedly demonstrating severe aortic stenosis with preserved left ventricular systolic function. The patient underwent left heart catheterization by Dr. Riley Kill yesterday.  The patient was found to have no significant coronary artery disease.  Attempts at right heart catheterization were aborted due to difficulty cannulating the right atrium from the groin approach.  No attempt was made to directly measure the transvalvular gradient across the aortic valve at catheterization. Cardiothoracic surgical consultation has been requested to consider aortic valve replacement versus transcatheter aortic valve replacement.  The patient reports long-standing exertional shortness of breath which she states is stable. He gets short of breath and weak with relatively  mild activity. He states that he has to walk using a walker because he gets weak and tired so quickly. He denies resting shortness of breath, PND, orthopnea, or lower extremity edema. He has had chronic dizzy spells but no history of syncope. He denies any history of chest pain or chest tightness either with activity or at rest.  The patient is widowed and divorced and lives alone. He has a daughter who reportedly lives locally and [place] and a sister who lives in [place]. He reports that he remains functionally independent and that he continues to drive an automobile do his own cooking and cleaning. He admits that prior to hospital admission he has had a problem with alcohol and he drinks at least 5 drinks daily.  He states that he is ready to stop drinking if his life depends upon it.    Past Medical History  Diagnosis Date  . Depression   . GERD (gastroesophageal reflux disease)   . COPD (chronic obstructive pulmonary disease)   . Aortic stenosis   . Essential hypertension, benign   . Decreased libido   . DIZZINESS, CHRONIC   . Alcohol abuse   . Shortness of breath 10/24/11    "all the time"  . Arthritis     "in the low vertebra"  . Chronic lower back pain   . Scoliosis/kyphoscoliosis 10/28/2011    Past Surgical History  Procedure Date  . No past surgeries     Family History  Problem Relation Age of Onset  . Other      no known medical hx of heart disease    Social History History  Substance Use Topics  . Smoking status: Former Smoker -- 2.5 years    Types: Cigarettes    Quit date: 07/02/1952  . Smokeless tobacco:  Never Used  . Alcohol Use: 37.8 oz/week    42 Glasses of wine, 21 Cans of beer per week     10/24/11 "drink q day; quite a bit"; Started 1974 (mostly hard liquor)    Prior to Admission medications   Medication Sig Start Date End Date Taking? Authorizing Provider  ALPRAZolam Prudy Feeler) 0.5 MG tablet Take 0.5 mg by mouth every 8 (eight) hours as needed. For  anxiety   Yes Historical Provider, MD  amLODipine (NORVASC) 10 MG tablet Take 10 mg by mouth daily.    Yes Historical Provider, MD  omega-3 acid ethyl esters (LOVAZA) 1 G capsule Take 1 g by mouth.     Yes Historical Provider, MD  vitamin B-12 (CYANOCOBALAMIN) 100 MCG tablet Take 50 mcg by mouth daily.     Yes Historical Provider, MD    Current Facility-Administered Medications  Medication Dose Route Frequency Provider Last Rate Last Dose  . 0.9 %  sodium chloride infusion   Intravenous Continuous Herby Abraham, MD 125 mL/hr at 10/27/11 1200    . acetaminophen (TYLENOL) tablet 650 mg  650 mg Oral Q4H PRN Herby Abraham, MD      . amLODipine (NORVASC) tablet 10 mg  10 mg Oral Daily Clanford Cyndie Mull, MD   10 mg at 10/28/11 1133  . antiseptic oral rinse (BIOTENE) solution 15 mL  15 mL Mouth Rinse BID Clanford L Johnson, MD   15 mL at 10/28/11 1000  . aspirin chewable tablet 81 mg  81 mg Oral Daily Lewayne Bunting, MD   81 mg at 10/28/11 1133  . folic acid (FOLVITE) tablet 1 mg  1 mg Oral Daily Clanford L Johnson, MD   1 mg at 10/28/11 1133  . furosemide (LASIX) tablet 40 mg  40 mg Oral Daily Lewayne Bunting, MD   40 mg at 10/28/11 1133  . LORazepam (ATIVAN) tablet 1 mg  1 mg Oral Q6H PRN Clanford Cyndie Mull, MD       Or  . LORazepam (ATIVAN) injection 1 mg  1 mg Intravenous Q6H PRN Clanford Cyndie Mull, MD      . multivitamin with minerals tablet 1 tablet  1 tablet Oral Daily Clanford Cyndie Mull, MD   1 tablet at 10/28/11 1133  . omega-3 acid ethyl esters (LOVAZA) capsule 1 g  1 g Oral Daily Clanford Cyndie Mull, MD   1 g at 10/28/11 1134  . ondansetron (ZOFRAN) tablet 4 mg  4 mg Oral Q6H PRN Clanford Cyndie Mull, MD   4 mg at 10/28/11 0518   Or  . ondansetron (ZOFRAN) injection 4 mg  4 mg Intravenous Q6H PRN Clanford L Johnson, MD      . pantoprazole (PROTONIX) EC tablet 40 mg  40 mg Oral Q0600 Clanford Cyndie Mull, MD   40 mg at 10/28/11 0511  . senna-docusate (Senokot-S) tablet 1 tablet  1  tablet Oral QHS PRN Clanford L Johnson, MD      . sodium chloride 0.9 % injection 3 mL  3 mL Intravenous Q12H Clanford Cyndie Mull, MD   3 mL at 10/28/11 1135  . thiamine (VITAMIN B-1) tablet 100 mg  100 mg Oral Daily Clanford L Johnson, MD   100 mg at 10/28/11 1133   Or  . thiamine (B-1) injection 100 mg  100 mg Intravenous Daily Clanford Cyndie Mull, MD       Facility-Administered Medications Ordered in Other Encounters  Medication Dose Route Frequency Provider Last Rate Last  Dose  . 0.9 %  sodium chloride infusion  1 mL/kg/hr Intravenous Continuous Lewayne Bunting, MD        No Known Allergies   Review of Systems:   General:  marginal appetite, decreased energy, no significant weight gain/loss  HEENT:  no blurry vision, no recent vision changes, no epistaxis, no hearing loss, full set dentures  Respiratory:  + shortness of breath, no cough, no wheezing, no hemoptysis, no pain with inspiration or cough  Cardiac:  no chest pain with/without exertion, SOB with mild exertion, no resting SOB, no PND, no orthopnea, no LE edema, no palpitations, no arrhythmia, + dizzy spells, no syncope  Vascular:  no pain suggestive of claudication  GI:   no difficulty swallowing, no heartburn/reflux, no hematochezia, no hematemesis, no melena, no constipation, some diarrhea   GU:   no dysuria, no urgency, no frequency  Musculoskeletal: mild arthritis pain in lower back but severe kyphoscoliosis, no myalgias, mobility limited primarily by shortness of breath and weakness, some difficulty walking, ambulates using a walker  Neuro:   no symptoms suggestive of TIA's, no seizures, no headaches, no peripheral neuropathy, denies instability of gait, denies memory/cognitive dysfunction  Endocrine:  no diabetes, no thyroid dysfunction  Hematologic:  + easy bruising, no anemia, no abnormal bleeding  Psych:   no anxiety, + depression        Physical Exam:   BP 134/68  Pulse 83  Temp 98.5 F (36.9 C) (Oral)  Resp  20  Ht 6\' 1"  (1.854 m)  Wt 75.841 kg (167 lb 3.2 oz)  BMI 22.06 kg/m2  SpO2 96%  General:  Elderly and somewhat disheveled-appearing  HEENT:  Unremarkable   Neck:   no JVD, no bruits, no adenopathy   Chest:   clear to auscultation, symmetrical breath sounds, no wheezes, no rhonchi   CV:   RRR, grade III/VI crescendo/decrescendo systolic murmur   Abdomen:  soft, non-tender, no masses   Extremities:  warm, well-perfused, pulses not palpable  Rectal/GU  Deferred  Neuro:   Grossly non-focal and symmetrical throughout  Skin:   Clean and dry, no rashes, no breakdown but frail and thin appearing  Diagnostic Tests:  Transthoracic Echocardiography  Patient: Samuel [name], Samuel [name] MR #: [id number] Study Date: 10/25/2011 Gender: M Age: 89 Height: 185.4cm Weight: 75.4kg BSA: 1.68m^2 Pt. Status: Room: 4704  PERFORMING [place], [facility] SONOGRAPHER Cathie Beams ADMITTING Johnson, Clanford ORDERING Johnson, Clanford ATTENDING [place], [place] cc:  ------------------------------------------------------------ LV EF: 60% - 65%  ------------------------------------------------------------ Indications: Previous study 03/10/2011. Aortic stenosis 424.1.  ------------------------------------------------------------ History: PMH: ETOH abuse. Depression. Chronic dizziness. Murmur. Generalized weakness. Chronic obstructive pulmonary disease. Risk factors: Hypertension.  ------------------------------------------------------------ Study Conclusions  - Left ventricle: The cavity size was normal. Wall thickness was increased in a pattern of mild LVH. Systolic function was normal. The estimated ejection fraction was in the range of 60% to 65%. Wall motion was normal; there were no regional wall motion abnormalities. Doppler parameters are consistent with abnormal left ventricular relaxation (grade 1 diastolic dysfunction). - Aortic valve: There was moderate stenosis. - Mitral valve: Moderately  to severely calcified annulus. - Pulmonary arteries: PA peak pressure: 32mm Hg (S). - Impressions: Technically limited study due to poor sound wave transmission. Impressions:  - Technically limited study due to poor sound wave transmission. Transthoracic echocardiography. M-mode, complete 2D, spectral Doppler, and color Doppler. Height: Height: 185.4cm. Height: 73in. Weight: Weight: 75.4kg. Weight: 165.9lb. Body mass index: BMI: 21.9kg/m^2. Body surface area: BSA: 1.61m^2. Blood pressure: 136/67. Patient  status: Inpatient. Location: Echo laboratory.  ------------------------------------------------------------  ------------------------------------------------------------ Left ventricle: The cavity size was normal. Wall thickness was increased in a pattern of mild LVH. Systolic function was normal. The estimated ejection fraction was in the range of 60% to 65%. Wall motion was normal; there were no regional wall motion abnormalities. Doppler parameters are consistent with abnormal left ventricular relaxation (grade 1 diastolic dysfunction).  ------------------------------------------------------------ Aortic valve: Poorly visualized. Moderately calcified leaflets. Doppler: There was moderate stenosis. No significant regurgitation. VTI ratio of LVOT to aortic valve: 0.23. Peak velocity ratio of LVOT to aortic valve: 0.23. Mean velocity ratio of LVOT to aortic valve: 0.23. Mean gradient: 33mm Hg (S). Peak gradient: 61mm Hg (S).  ------------------------------------------------------------ Aorta: Aortic root: The aortic root was normal in size. Ascending aorta: The ascending aorta was normal in size.  ------------------------------------------------------------ Mitral valve: Moderately to severely calcified annulus. Leaflet separation was normal. Doppler: Transvalvular velocity was within the normal range. There was no evidence for stenosis. No significant regurgitation.  Peak gradient: 2mm Hg (D).  ------------------------------------------------------------ Left atrium: The atrium was normal in size.  ------------------------------------------------------------ Right ventricle: The cavity size was normal. Wall thickness was normal. Systolic function was normal.  ------------------------------------------------------------ Pulmonic valve: Poorly visualized.  ------------------------------------------------------------ Tricuspid valve: Poorly visualized. Doppler: No significant regurgitation.  ------------------------------------------------------------ Right atrium: The atrium was normal in size.  ------------------------------------------------------------ Pericardium: There was no pericardial effusion.  ------------------------------------------------------------  2D measurements Normal Doppler Normal Left ventricle measurements LVID ED, 31.5 mm 43-52 Main pulmonary chord, artery PLAX Pressure, S 32 mm =30 LVID ES, 18 mm 23-38 Hg chord, Left ventricle PLAX Ea, lat 4.39 cm/ ------- FS, chord, 43 % >29 ann, tiss s PLAX DP LVPW, ED 11.9 mm ------ E/Ea, lat 17.15 ------- IVS/LVPW 0.87 <1.3 ann, tiss ratio, ED DP Ventricular septum Ea, med 4.39 cm/ ------- IVS, ED 10.4 mm ------ ann, tiss s Aorta DP Root diam, 24 mm ------ E/Ea, med 17.15 ------- ED ann, tiss Left atrium DP AP dim 33 mm ------ LVOT AP dim 1.68 cm/m^2 <2.2 Peak vel, S 88.7 cm/ ------- index s Mean vel, S 61.3 cm/ ------- s VTI, S 20.8 cm ------- Aortic valve Peak vel, S 392 cm/ ------- s Mean vel, S 261 cm/ ------- s VTI, S 88.9 cm ------- Mean 33 mm ------- gradient, S Hg Peak 61 mm ------- gradient, S Hg VTI ratio 0.23 ------- LVOT/AV Peak vel 0.23 ------- ratio, LVOT/AV Vmean ratio 0.23 ------- LVOT/AV Mitral valve Peak E vel 75.3 cm/ ------- s Peak A vel 150 cm/ ------- s Deceleratio 282 ms 150-230 n time Peak 2 mm ------- gradient, D  Hg Peak E/A 0.5 ------- ratio Tricuspid valve Regurg peak 259 cm/ ------- vel s Peak RV-RA 27 mm ------- gradient, S Hg Systemic veins Estimated 5 mm ------- CVP Hg Right ventricle Pressure, S 32 mm <30 Hg Sa vel, lat 14.7 cm/ ------- ann, tiss s DP  ------------------------------------------------------------   Cardiac Catheterization Procedure Note  Name: KENDRY PFARR  MRN: [id number]  DOB: 05-08-1926  Procedure: Right Heart Cath, Placement of catheters without left heart cath, Selective Coronary Angiography, aortic root angiography  Indication: Aortic stenosis  Procedural Details: The right groin was prepped, draped, and anesthetized with 1% lidocaine. Using the modified Seldinger technique a 6 French sheath was placed in the right femoral artery and a 7 French sheath was placed in the right femoral vein. A Swan-Ganz catheter was used for the right heart catheterization. We were unable to get up beyond the RA due  to marked scoliosis. Only RA pressure was measured. We did a hand injection. Standard Judkins catheters were used for selective coronary angiography and aortic root, but was difficult due to the tortuosity. . There were no immediate procedural complications. The patient was transferred to the post catheterization recovery area for further monitoring.  Procedural Findings:  Hemodynamics  RA 9 (?) if RA  RV not done  PA not done  PCWP not done  LV not done  AO 146/63 (97)  Oxygen saturations:  PA not done  AO 94% on O2  Cardiac Output (Fick) not done  Cardiac Index (Fick) not done  Coronary angiography:  Coronary dominance: right  Aortic root demonstrated a high calcified valve with minimal mobility. There was at least moderate AR, graded at 2-3 plus.  Left mainstem: short no obstruction  Left anterior descending (LAD): Mild irregularity without obstruction  Left circumflex (LCx): Minor luminal irregularity without obstruction.  Right coronary artery (RCA):  Large caliber vessel with mild proximal irregularity, but no critical disease. Large PDA and PLA.  Left ventriculography: Left ventricular systolic function is normal, LVEF is estimated at 55-65%, there is no significant mitral regurgitation  Final Conclusions:  1. Severe AS by multiple echoes, with AI by angio  2. No significant coronary obstruction  3. Unable to get to RA from the femoral approach due to severe scoliosis  Recommendations:  1. Spoke with Dr. Elpidio Galea surgical consult  2. RHC from IJ with doppler if needed  3. ? AVR versus TAVR consideration. The latter would likely require transapical or more central approach. See catheter orientation on fluoro.  [name]  10/27/2011, 10:32 AM  I have personally reviewed the patient's recent echocardiogram and cardiac catheterization. I agree that the patient probably has severe aortic stenosis, although peak velocity across the valve was less than 4 m/sec.  Left ventricular systolic function remains normal.  The gradient across the aortic valve was not assessed at catheterization.  The patient does not have significant artery disease. Right heart catheterization was not done.    STS Risk Calculator  Procedure    AVR  Risk of Mortality   3.3% Morbidity or Mortality  19% Prolonged LOS   8% Short LOS    30% Permanent Stroke   1.4% Prolonged Vent Support  11% DSW Infection    0.4% Renal Failure    5% Reoperation    8%     Impression:  The patient likely has severe symptomatic aortic stenosis with preserved left ventricular systolic function. Although the patient is not a very good historian and it's difficult to tell for certain how much of his complaints are related to aortic stenosis, it appears that the patient has symptoms of exertional shortness of breath and weakness which are quite significant but reportedly stable. The patient's functional status is undoubtedly also affected by his continued battle with  alcohol abuse and his current living arrangements.  The patient does have severe kyphoscoliosis of the spine which may affect his underlying respiratory function, but he has dealt with this for 84 years.  The patient denies history of significant tobacco use.  Pulmonary function tests have not yet been performed.  Overall I would agree that the patient's risks associated with conventional aortic valve replacement will be somewhat elevated because of his advanced age and his somewhat debilitated physical condition. However, risks of conventional aortic valve replacement are certainly not prohibitive. The most important question relates to the patient's ongoing battle with alcohol abuse and his  home social circumstances.  I don't think the added risks related to alcoholism should be considered as an indication for transcatheter aortic valve replacement in a patient who should otherwise tolerate conventional surgery.  Balloon valvuloplasty could be considered either as a temporizing measure if the patient were truly decompensating due to critical aortic stenosis (which is not the case) or as a means to confirm whether or not the patient's ongoing symptoms are truly related to aortic stenosis.   Plan:  The patient was counseled at length regarding treatment alternatives for management of severe symptomatic aortic stenosis. The somewhat high-risk nature of surgical intervention has been discussed in detail as related to the patient's current condition. Long-term prognosis with medical therapy was discussed. Alternative approaches such as conventional aortic valve replacement, transcatheter aortic valve replacement, and palliative medical therapy were compared and contrasted at length. This discussion was placed in the context of the patient's own specific clinical presentation and past medical history.  All of his questions have been addressed.  The patient desires to think matters over further before making any  decisions.  We will obtain pulmonary function tests to further stratify the patient's risks associated with surgical intervention. We will try to meet with the patient's family to discuss matters, and we will review options with Dr Jens Som, Dr Riley Kill and Dr Excell Seltzer.     Salvatore Decent. Cornelius Moras, MD 10/28/2011 12:01 PM

## 2011-10-28 NOTE — Progress Notes (Signed)
SUBJECTIVE:  No chest pain or SHOB.  Mild right groin pain post cath.  No lightheadedness.  Sneezing, feels cold.  Wants to go home.  OBJECTIVE:   Vitals:   Filed Vitals:   10/27/11 1400 10/27/11 1500 10/27/11 2218 10/28/11 0635  BP: 125/64 149/94 131/71 134/68  Pulse: 79 82 82 83  Temp:   98.4 F (36.9 C) 98.5 F (36.9 C)  TempSrc:   Oral Oral  Resp: 18 18  20   Height:      Weight:    75.841 kg (167 lb 3.2 oz)  SpO2:   94% 96%   I&O's:   Intake/Output Summary (Last 24 hours) at 10/28/11 1120 Last data filed at 10/28/11 7829  Gross per 24 hour  Intake 1106.67 ml  Output    428 ml  Net 678.67 ml   TELEMETRY: Reviewed telemetry pt in NSR:     PHYSICAL EXAM General: frail Head:    Normal cephalic and atramatic  Lungs: No wheezing Heart:   HRRR S1 S2 3/6 systolic murmur Abdomen:  abdomen soft and non-tender Msk:  Back normal, normal gait. Normal strength and tone for age. Extremities:   No  edema.  Right DP +2; no right groin hematoma; mild bruising Neuro: Alert and oriented Psych:  Normal affect, responds appropriately   LABS: Basic Metabolic Panel:  Basename 10/28/11 0620 10/27/11 0635  NA 141 141  K 3.7 3.6  CL 100 100  CO2 34* 34*  GLUCOSE 115* 107*  BUN 8 11  CREATININE 0.87 0.92  CALCIUM 8.6 8.6  MG -- --  PHOS -- --   Liver Function Tests: No results found for this basename: AST:2,ALT:2,ALKPHOS:2,BILITOT:2,PROT:2,ALBUMIN:2 in the last 72 hours No results found for this basename: LIPASE:2,AMYLASE:2 in the last 72 hours CBC:  Basename 10/26/11 0520  WBC 4.3  NEUTROABS --  HGB 14.7  HCT 42.4  MCV 94.2  PLT 127*   Cardiac Enzymes: No results found for this basename: CKTOTAL:3,CKMB:3,CKMBINDEX:3,TROPONINI:3 in the last 72 hours BNP: No components found with this basename: POCBNP:3 D-Dimer: No results found for this basename: DDIMER:2 in the last 72 hours Hemoglobin A1C: No results found for this basename: HGBA1C in the last 72 hours Fasting  Lipid Panel: No results found for this basename: CHOL,HDL,LDLCALC,TRIG,CHOLHDL,LDLDIRECT in the last 72 hours Thyroid Function Tests: No results found for this basename: TSH,T4TOTAL,FREET3,T3FREE,THYROIDAB in the last 72 hours Anemia Panel: No results found for this basename: VITAMINB12,FOLATE,FERRITIN,TIBC,IRON,RETICCTPCT in the last 72 hours Coag Panel:   Lab Results  Component Value Date   INR 1.07 10/25/2011   INR 1.0 03/06/2011   INR 0.9 07/19/2007    RADIOLOGY: Ct Head Wo Contrast  10/24/2011  *RADIOLOGY REPORT*  Clinical Data: Altered mental status.  CT HEAD WITHOUT CONTRAST  Technique:  Contiguous axial images were obtained from the base of the skull through the vertex without contrast.  Comparison: 12/13/2010  Findings: Stable atrophy and mild small vessel disease. The brain demonstrates no evidence of hemorrhage, infarction, edema, mass effect, extra-axial fluid collection, hydrocephalus or mass lesion. The skull is unremarkable.  IMPRESSION: No acute abnormalities by CT.  Stable atrophy and small vessel disease.  Original Report Authenticated By: Reola Calkins, M.D.   Dg Chest Port 1 View  10/24/2011  *RADIOLOGY REPORT*  Clinical Data: Altered mental status  PORTABLE CHEST - 1 VIEW  Comparison: 12/13/2010  Findings: Severe dextroscoliosis in the thoracic spine is unchanged.  Lungs are clear without infiltrate or effusion.  Negative for heart failure or  pneumonia.  IMPRESSION: No acute cardiopulmonary disease.  Original Report Authenticated By: Camelia Phenes, M.D.      ASSESSMENT: Severe aortic stenosis.  No significant CAD.  Normal LV function in the past.  PLAN:  Dr. Cornelius Moras will see regarding possible treatment options for the aortic stenosis.  No symptoms this morning.  Alcohol abuse has been an issue as well.   Per admission H&P, home situation is likely suboptimal.  Will need some discharge planning as well.     Corky Crafts., MD  10/28/2011  11:20 AM

## 2011-10-28 NOTE — Progress Notes (Signed)
[facility] PROGRESS NOTE  Samuel [name] JWJ:[id number] DOB: [birth year] DOA: 10/24/2011 PCP: Ralene Ok, MD  Assessment/Plan: Active Problems:  ALCOHOL ABUSE  DEPRESSION  Essential hypertension, benign  AORTIC STENOSIS  COPD  DIZZINESS, CHRONIC  HEART MURMUR  DYSPNEA ON EXERTION  Alcohol intoxication  Physical deconditioning  Generalized weakness  Altered mental status  Fever and chills  Dysphagia  1. Aortic Stenosis/heart murmur/CAD - Defer to cardiology at this juncture given current history.  -Surgeon to evaluate patient further given recent cath results.  Dr. Barry Dienes has discussed current situation with patient and has ordered some more tests.  We will follow up with surgeon's recommendations. - PT has recommended SNF for placement.  I have discussed with patient and he does not want to go to SNF upon discharge. Will readdress tomorrow.  2. ETOH abuse:  -Will monitor closely.  -Currently on CIWA protocol.  - Will continue Lorazepam per protocol.  None given recently and patient is currently calm - No visible anxiety on exam today.   3. HTN: Last check was 134-68.  Will continue to monitor.   4. Generalized weakness/dyspnea on exertion:  -Likely related to number one.  - F/u with cardiology  - Cardiac enzymes x 3 negative  5. DVT prophylaxis   Code Status: Full  Family Communication: No family at bedside. Tried to contact daughter but was unable to reach her. Disposition Plan: Per Cardiology at this juncture   Brief narrative:  Please refer to HPI  Consultants:  Cardiology Procedures:  2 D echocardiogram  Heart Catheterizaion Antibiotics:  None   HPI/Subjective: Patient with no acute complaints.  Denies any SOB, Chest pain, diaphoresis.  Spoke recently to the surgeon.  Does not like the idea of going to SNF and upon discharge would like to be transitioned to home with home health despite physical therapy's recommendations.  I have advised against it  but patient is pretty persistent.  Objective: Filed Vitals:   10/27/11 1400 10/27/11 1500 10/27/11 2218 10/28/11 0635  BP: 125/64 149/94 131/71 134/68  Pulse: 79 82 82 83  Temp:   98.4 F (36.9 C) 98.5 F (36.9 C)  TempSrc:   Oral Oral  Resp: 18 18  20   Height:      Weight:    75.841 kg (167 lb 3.2 oz)  SpO2:   94% 96%    Intake/Output Summary (Last 24 hours) at 10/28/11 1220 Last data filed at 10/28/11 0826  Gross per 24 hour  Intake 1106.67 ml  Output    103 ml  Net 1003.67 ml    Exam:  General: Pt in NAD, A and O x 3  Cardiovascular: systolic murmur, RRR  Respiratory: CTA, no wheezes  Abdomen: soft, nt, nd   Data Reviewed: Basic Metabolic Panel:  Lab 10/28/11 6213 10/27/11 0635 10/26/11 0520 10/25/11 0251 10/24/11 1935  NA 141 141 142 142 141  K 3.7 3.6 3.2* 3.7 4.4  CL 100 100 97 98 101  CO2 34* 34* 35* 28 25  GLUCOSE 115* 107* 77 85 88  BUN 8 11 14 10 10   CREATININE 0.87 0.92 0.99 0.87 0.80  CALCIUM 8.6 8.6 8.8 8.9 8.5  MG -- -- -- -- 1.9  PHOS -- -- -- -- 3.8   Liver Function Tests:  Lab 10/25/11 0251 10/24/11 1935  AST 38* 44*  ALT 14 15  ALKPHOS 64 61  BILITOT 0.8 0.5  PROT 7.6 7.6  ALBUMIN 3.7 3.6   No results found for this basename:  LIPASE:5,AMYLASE:5 in the last 168 hours  Lab 10/24/11 1902  AMMONIA 23   CBC:  Lab 10/26/11 0520 10/25/11 0251 10/24/11 1935  WBC 4.3 4.9 4.0  NEUTROABS -- -- 1.9  HGB 14.7 15.1 15.1  HCT 42.4 42.6 42.8  MCV 94.2 92.4 93.2  PLT 127* 136* 141*   Cardiac Enzymes:  Lab 10/25/11 1010 10/25/11 0243 10/24/11 1925  CKTOTAL 70 58 74  CKMB 3.1 2.9 2.6  CKMBINDEX -- -- --  TROPONINI <0.30 <0.30 <0.30   BNP (last 3 results)  Basename 10/25/11 0251 10/24/11 1935 12/14/10 0512  PROBNP 537.7* 732.1* 1061.0*   CBG: No results found for this basename: GLUCAP:5 in the last 168 hours  Recent Results (from the past 240 hour(s))  CULTURE, BLOOD (ROUTINE X 2)     Status: Normal (Preliminary result)    Collection Time   10/24/11  6:45 PM      Component Value Range Status Comment   Specimen Description BLOOD LEFT ARM   Final    Special Requests BOTTLES DRAWN AEROBIC AND ANAEROBIC 10CC   Final    Culture  Setup Time 10/25/2011 02:40   Final    Culture     Final    Value:        BLOOD CULTURE RECEIVED NO GROWTH TO DATE CULTURE WILL BE HELD FOR 5 DAYS BEFORE ISSUING A FINAL NEGATIVE REPORT   Report Status PENDING   Incomplete   CULTURE, BLOOD (ROUTINE X 2)     Status: Normal (Preliminary result)   Collection Time   10/24/11  7:00 PM      Component Value Range Status Comment   Specimen Description BLOOD RIGHT ARM   Final    Special Requests BOTTLES DRAWN AEROBIC AND ANAEROBIC 10CC   Final    Culture  Setup Time 10/25/2011 02:40   Final    Culture     Final    Value:        BLOOD CULTURE RECEIVED NO GROWTH TO DATE CULTURE WILL BE HELD FOR 5 DAYS BEFORE ISSUING A FINAL NEGATIVE REPORT   Report Status PENDING   Incomplete   URINE CULTURE     Status: Normal   Collection Time   10/24/11 10:40 PM      Component Value Range Status Comment   Specimen Description URINE, RANDOM   Final    Special Requests Immunocompromised   Final    Culture  Setup Time 10/25/2011 05:54   Final    Colony Count NO GROWTH   Final    Culture NO GROWTH   Final    Report Status 10/26/2011 FINAL   Final      Studies: Ct Head Wo Contrast  10/24/2011  *RADIOLOGY REPORT*  Clinical Data: Altered mental status.  CT HEAD WITHOUT CONTRAST  Technique:  Contiguous axial images were obtained from the base of the skull through the vertex without contrast.  Comparison: 12/13/2010  Findings: Stable atrophy and mild small vessel disease. The brain demonstrates no evidence of hemorrhage, infarction, edema, mass effect, extra-axial fluid collection, hydrocephalus or mass lesion. The skull is unremarkable.  IMPRESSION: No acute abnormalities by CT.  Stable atrophy and small vessel disease.  Original Report Authenticated By: Reola Calkins, M.D.   Dg Chest Port 1 View  10/24/2011  *RADIOLOGY REPORT*  Clinical Data: Altered mental status  PORTABLE CHEST - 1 VIEW  Comparison: 12/13/2010  Findings: Severe dextroscoliosis in the thoracic spine is unchanged.  Lungs are clear without infiltrate  or effusion.  Negative for heart failure or pneumonia.  IMPRESSION: No acute cardiopulmonary disease.  Original Report Authenticated By: Camelia Phenes, M.D.    Scheduled Meds:   . amLODipine  10 mg Oral Daily  . antiseptic oral rinse  15 mL Mouth Rinse BID  . aspirin  81 mg Oral Daily  . folic acid  1 mg Oral Daily  . furosemide  40 mg Oral Daily  . multivitamin with minerals  1 tablet Oral Daily  . omega-3 acid ethyl esters  1 g Oral Daily  . pantoprazole  40 mg Oral Q0600  . sodium chloride  3 mL Intravenous Q12H  . thiamine  100 mg Oral Daily   Or  . thiamine  100 mg Intravenous Daily   Continuous Infusions:   . sodium chloride 125 mL/hr at 10/27/11 1200    Active Problems:  ALCOHOL ABUSE  DEPRESSION  Essential hypertension, benign  AORTIC STENOSIS  COPD  DIZZINESS, CHRONIC  HEART MURMUR  DYSPNEA ON EXERTION  Alcohol intoxication  Physical deconditioning  Generalized weakness  Altered mental status  Fever and chills  Dysphagia    Time spent: > 35 minutes discussing with patient spent > 15 minutes. Medical decision making and documenting.    Penny Pia  [facility] Pager [phone] If 8PM-8AM, please contact night-coverage at [email or web address], password [facility] 10/28/2011, 12:20 PM  LOS: 4 days

## 2011-10-29 ENCOUNTER — Inpatient Hospital Stay (HOSPITAL_COMMUNITY): Payer: Medicare Other

## 2011-10-29 ENCOUNTER — Other Ambulatory Visit (HOSPITAL_COMMUNITY): Payer: Medicare Other

## 2011-10-29 DIAGNOSIS — I714 Abdominal aortic aneurysm, without rupture, unspecified: Secondary | ICD-10-CM

## 2011-10-29 HISTORY — DX: Abdominal aortic aneurysm, without rupture: I71.4

## 2011-10-29 HISTORY — DX: Abdominal aortic aneurysm, without rupture, unspecified: I71.40

## 2011-10-29 LAB — BLOOD GAS, ARTERIAL
Acid-Base Excess: 6.7 mmol/L — ABNORMAL HIGH (ref 0.0–2.0)
Drawn by: 362741
FIO2: 0.21 %
O2 Saturation: 94.1 %
pCO2 arterial: 47.4 mmHg — ABNORMAL HIGH (ref 35.0–45.0)
pO2, Arterial: 68.8 mmHg — ABNORMAL LOW (ref 80.0–100.0)

## 2011-10-29 MED ORDER — IOHEXOL 350 MG/ML SOLN
100.0000 mL | Freq: Once | INTRAVENOUS | Status: AC | PRN
  Administered 2011-10-29: 100 mL via INTRAVENOUS

## 2011-10-29 NOTE — Progress Notes (Signed)
[facility] PROGRESS NOTE  CORDARIOUS ZEEK BJY:[id number] DOB: 1927-02-22 DOA: 10/24/2011 PCP: Ralene Ok, MD  Assessment/Plan: Active Problems:  ALCOHOL ABUSE  DEPRESSION  Essential hypertension, benign  AORTIC STENOSIS  COPD  DIZZINESS, CHRONIC  HEART MURMUR  DYSPNEA ON EXERTION  Alcohol intoxication  Physical deconditioning  Generalized weakness  Altered mental status  Fever and chills  Dysphagia 1. Aortic Stenosis/heart murmur/CAD - Defer to cardiology at this juncture given current history.  -Surgeon to evaluate patient further given recent cath results. Dr. Barry Dienes has discussed current situation with patient and has ordered some more tests. We will follow up with surgeon's recommendations.  - PT has recommended SNF for placement. I have discussed with patient and he does not want to go to SNF upon discharge. As such will set patient up for home health with home health aide, nursing, and physical therapy.  Have strongly advised SNF but patient still wishes to go home.  Will likely d/c tomorrow unless there are objections by specialist involved.  2. ETOH abuse:  -Will monitor closely.  -Currently on CIWA protocol.  - Will continue Lorazepam per protocol. None given recently and patient is currently calm  - Pt has some anxiety today.  Nursing has mentioned that patient takes xanax at home.  I think this is not wise to give xanax and will not recommend it for home use either given his strong history of alcohol abuse.   Anxiety is likely related to alcohol withdrawal and as such patient has lorazepam for this.   3. HTN: Stable continue current regimen. Will continue to monitor.   4. Generalized weakness/dyspnea on exertion:  -Likely related to number one.  - F/u with cardiology  - Cardiac enzymes x 3 negative   5. DVT prophylaxis   Code Status: Full  Family Communication: No family at bedside. Tried to contact daughter but was unable to reach her.  Disposition  Plan: Per Cardiology at this juncture   Brief narrative:  Please refer to HPI  Consultants:  Cardiology Cardiothoracic surgeon Procedures:  2 D echocardiogram  Heart Catheterizaion PFT's pending and CT of abdomen and pelvis pending   Antibiotics:  None   HPI/Subjective: Patient has no new complaints today.  No acute issues reported overnight.  I tried to reach daughter but was unable to reach her.  He denies any SOB or chest pain today.  Objective: Filed Vitals:   10/28/11 1300 10/28/11 2040 10/29/11 0426 10/29/11 1338  BP: 124/66 133/62 101/52 131/61  Pulse: 78 81 74 88  Temp: 98.1 F (36.7 C) 98.1 F (36.7 C) 98 F (36.7 C) 98.5 F (36.9 C)  TempSrc: Oral Oral Axillary Oral  Resp: 20 18 18 20   Height:      Weight:   74.753 kg (164 lb 12.8 oz)   SpO2: 96% 100% 93% 97%    Intake/Output Summary (Last 24 hours) at 10/29/11 1402 Last data filed at 10/29/11 1300  Gross per 24 hour  Intake    960 ml  Output    501 ml  Net    459 ml    Exam:  General: Pt in NAD, A and O x 3 Cardiovascular: systolic murmur, RRR  Respiratory: CTA, no wheezes  Abdomen: soft, nt, nd  Data Reviewed: Basic Metabolic Panel:  Lab 10/28/11 0865 10/27/11 0635 10/26/11 0520 10/25/11 0251 10/24/11 1935  NA 141 141 142 142 141  K 3.7 3.6 3.2* 3.7 4.4  CL 100 100 97 98 101  CO2 34* 34*  35* 28 25  GLUCOSE 115* 107* 77 85 88  BUN 8 11 14 10 10   CREATININE 0.87 0.92 0.99 0.87 0.80  CALCIUM 8.6 8.6 8.8 8.9 8.5  MG -- -- -- -- 1.9  PHOS -- -- -- -- 3.8   Liver Function Tests:  Lab 10/25/11 0251 10/24/11 1935  AST 38* 44*  ALT 14 15  ALKPHOS 64 61  BILITOT 0.8 0.5  PROT 7.6 7.6  ALBUMIN 3.7 3.6   No results found for this basename: LIPASE:5,AMYLASE:5 in the last 168 hours  Lab 10/24/11 1902  AMMONIA 23   CBC:  Lab 10/26/11 0520 10/25/11 0251 10/24/11 1935  WBC 4.3 4.9 4.0  NEUTROABS -- -- 1.9  HGB 14.7 15.1 15.1  HCT 42.4 42.6 42.8  MCV 94.2 92.4 93.2  PLT 127* 136*  141*   Cardiac Enzymes:  Lab 10/25/11 1010 10/25/11 0243 10/24/11 1925  CKTOTAL 70 58 74  CKMB 3.1 2.9 2.6  CKMBINDEX -- -- --  TROPONINI <0.30 <0.30 <0.30   BNP (last 3 results)  Basename 10/25/11 0251 10/24/11 1935 12/14/10 0512  PROBNP 537.7* 732.1* 1061.0*   CBG: No results found for this basename: GLUCAP:5 in the last 168 hours  Recent Results (from the past 240 hour(s))  CULTURE, BLOOD (ROUTINE X 2)     Status: Normal (Preliminary result)   Collection Time   10/24/11  6:45 PM      Component Value Range Status Comment   Specimen Description BLOOD LEFT ARM   Final    Special Requests BOTTLES DRAWN AEROBIC AND ANAEROBIC 10CC   Final    Culture  Setup Time 10/25/2011 02:40   Final    Culture     Final    Value:        BLOOD CULTURE RECEIVED NO GROWTH TO DATE CULTURE WILL BE HELD FOR 5 DAYS BEFORE ISSUING A FINAL NEGATIVE REPORT   Report Status PENDING   Incomplete   CULTURE, BLOOD (ROUTINE X 2)     Status: Normal (Preliminary result)   Collection Time   10/24/11  7:00 PM      Component Value Range Status Comment   Specimen Description BLOOD RIGHT ARM   Final    Special Requests BOTTLES DRAWN AEROBIC AND ANAEROBIC 10CC   Final    Culture  Setup Time 10/25/2011 02:40   Final    Culture     Final    Value:        BLOOD CULTURE RECEIVED NO GROWTH TO DATE CULTURE WILL BE HELD FOR 5 DAYS BEFORE ISSUING A FINAL NEGATIVE REPORT   Report Status PENDING   Incomplete   URINE CULTURE     Status: Normal   Collection Time   10/24/11 10:40 PM      Component Value Range Status Comment   Specimen Description URINE, RANDOM   Final    Special Requests Immunocompromised   Final    Culture  Setup Time 10/25/2011 05:54   Final    Colony Count NO GROWTH   Final    Culture NO GROWTH   Final    Report Status 10/26/2011 FINAL   Final      Studies: Dg Chest 2 View  10/29/2011  *RADIOLOGY REPORT*  Clinical Data: Aortic stenosis.  CHEST - 2 VIEW  Comparison: 10/24/2011 and prior chest  radiographs dating back to 2009  Findings: Cardiomegaly is again noted. Mediastinal silhouette is unchanged. There is no evidence of focal airspace disease, pulmonary edema, suspicious  pulmonary nodule/mass, pleural effusion, or pneumothorax. No acute bony abnormalities are identified. A severe thoracic scoliosis is present.  IMPRESSION: Cardiomegaly without evidence of acute cardiopulmonary disease.  Severe thoracic scoliosis.  Original Report Authenticated By: Rosendo Gros, M.D.   Ct Head Wo Contrast  10/24/2011  *RADIOLOGY REPORT*  Clinical Data: Altered mental status.  CT HEAD WITHOUT CONTRAST  Technique:  Contiguous axial images were obtained from the base of the skull through the vertex without contrast.  Comparison: 12/13/2010  Findings: Stable atrophy and mild small vessel disease. The brain demonstrates no evidence of hemorrhage, infarction, edema, mass effect, extra-axial fluid collection, hydrocephalus or mass lesion. The skull is unremarkable.  IMPRESSION: No acute abnormalities by CT.  Stable atrophy and small vessel disease.  Original Report Authenticated By: Reola Calkins, M.D.   Dg Chest Port 1 View  10/24/2011  *RADIOLOGY REPORT*  Clinical Data: Altered mental status  PORTABLE CHEST - 1 VIEW  Comparison: 12/13/2010  Findings: Severe dextroscoliosis in the thoracic spine is unchanged.  Lungs are clear without infiltrate or effusion.  Negative for heart failure or pneumonia.  IMPRESSION: No acute cardiopulmonary disease.  Original Report Authenticated By: Camelia Phenes, M.D.    Scheduled Meds:   . ALPRAZolam  0.5 mg Oral Once  . amLODipine  10 mg Oral Daily  . antiseptic oral rinse  15 mL Mouth Rinse BID  . aspirin  81 mg Oral Daily  . folic acid  1 mg Oral Daily  . furosemide  40 mg Oral Daily  . multivitamin with minerals  1 tablet Oral Daily  . omega-3 acid ethyl esters  1 g Oral Daily  . pantoprazole  40 mg Oral Q0600  . sodium chloride  3 mL Intravenous Q12H  . thiamine   100 mg Oral Daily   Or  . thiamine  100 mg Intravenous Daily   Continuous Infusions:   Active Problems:  ALCOHOL ABUSE  DEPRESSION  Essential hypertension, benign  AORTIC STENOSIS  COPD  DIZZINESS, CHRONIC  HEART MURMUR  DYSPNEA ON EXERTION  Alcohol intoxication  Physical deconditioning  Generalized weakness  Altered mental status  Fever and chills  Dysphagia    Time spent: > 35 minutes  Medical decision making, discussion with patient, setting up home health needs    Penny Pia  [facility] Pager [phone] If 8PM-8AM, please contact night-coverage at [email or web address], password [facility] 10/29/2011, 2:02 PM  LOS: 5 days

## 2011-10-29 NOTE — Progress Notes (Signed)
CARDIOTHORACIC SURGERY PROGRESS NOTE  2 Days Post-Op  S/P Procedure(s) (LRB): LEFT AND RIGHT HEART CATHETERIZATION WITH CORONARY ANGIOGRAM (N/A)  Subjective: No new complaints  Objective: Vital signs in last 24 hours: Temp:  [98 F (36.7 C)-98.1 F (36.7 C)] 98 F (36.7 C) (07/28 0426) Pulse Rate:  [74-81] 74  (07/28 0426) Cardiac Rhythm:  [-] Normal sinus rhythm (07/28 0943) Resp:  [18-20] 18  (07/28 0426) BP: (101-133)/(52-66) 101/52 mmHg (07/28 0426) SpO2:  [93 %-100 %] 93 % (07/28 0426) Weight:  [74.753 kg (164 lb 12.8 oz)] 74.753 kg (164 lb 12.8 oz) (07/28 0426)  Physical Exam:  Rhythm:   sinus  Breath sounds: clear  Heart sounds:  RRR with murmur  Incisions:  n/a  Abdomen:  soft  Extremities:  warm   Intake/Output from previous day: 07/27 0701 - 07/28 0700 In: 720 [P.O.:720] Out: 702 [Urine:700; Stool:2] Intake/Output this shift: Total I/O In: 360 [P.O.:360] Out: -   Lab Results: No results found for this basename: WBC:2,HGB:2,HCT:2,PLT:2 in the last 72 hours BMET:  [facility] 10/28/11 0620 10/27/11 0635  NA 141 141  K 3.7 3.6  CL 100 100  CO2 34* 34*  GLUCOSE 115* 107*  BUN 8 11  CREATININE 0.87 0.92  CALCIUM 8.6 8.6    ABG Results for JABBAR, PALMERO (MRN [id number]) as of 10/29/2011 12:45  Ref. Range 10/29/2011 04:45  Sample type No range found ARTERIAL  FIO2 No range found 0.21  pH, Arterial Latest Range: 7.350-7.450  7.431  pCO2 arterial Latest Range: 35.0-45.0 mmHg 47.4 (H)  pO2, Arterial Latest Range: 80.0-100.0 mmHg 68.8 (L)  Bicarbonate Latest Range: 20.0-24.0 mEq/L 31.0 (H)  TCO2 Latest Range: 0-100 mmol/L 32.5  Acid-Base Excess Latest Range: 0.0-2.0 mmol/L 6.7 (H)  O2 Saturation No range found 94.1  Patient temperature No range found 98.6  Collection site No range found LEFT RADIAL  Allens test (pass/fail) Latest Range: PASS  PASS    CBG (last 3)  No results found for this basename: GLUCAP:3 in the last 72 hours PT/INR:  No  results found for this basename: LABPROT,INR in the last 72 hours  CXR:  *RADIOLOGY REPORT*  Clinical Data: Aortic stenosis.  CHEST - 2 VIEW  Comparison: 10/24/2011 and prior chest radiographs dating back to  2009  Findings: Cardiomegaly is again noted.  Mediastinal silhouette is unchanged.  There is no evidence of focal airspace disease, pulmonary edema,  suspicious pulmonary nodule/mass, pleural effusion, or  pneumothorax.  No acute bony abnormalities are identified.  A severe thoracic scoliosis is present.  IMPRESSION:  Cardiomegaly without evidence of acute cardiopulmonary disease.  Severe thoracic scoliosis.  Original Report Authenticated By: Rosendo Gros, M.D.   Assessment/Plan: S/P Procedure(s) (LRB): LEFT AND RIGHT HEART CATHETERIZATION WITH CORONARY ANGIOGRAM (N/A)  Mr. Lamping remains clinically stable. He wants to go home for at least a brief period of time to discuss matters with friends and family and decide whether or not to proceed with aortic valve replacement. He is very concerned about the prospects of open heart surgery in the fact that his recovery would undoubtedly require at least a brief period time in the skilled nursing facility or some type of rehabilitation facility.  He has also specifically asked about minimally invasive techniques for aortic valve replacement. I remain very concerned about his home situation and his problems with alcohol abuse. To my knowledge no family members have been here to visit, and I've offered to make an effort to return to  meet with them here in the hospital when they come.  Will order CT angiogram of the chest abdomen and pelvis to further evaluate the presence in significance of atherosclerotic disease involving the aorta and iliac vessels as well as the potential impact that the patient's severe kyphoscoliosis would make on potential surgical approaches. Pulmonary function tests and carotid duplex scan remain pending at this time. If  the patient is to be discharged home I would be happy to arrange for followup appointment in the office in the near future as desired.  OWEN,CLARENCE H 10/29/2011 12:44 PM

## 2011-10-29 NOTE — Progress Notes (Signed)
   CARE MANAGEMENT NOTE 10/29/2011  Patient:  Samuel [name], Samuel [name]   Account Number:  [id number]  Date Initiated:  10/29/2011  Documentation initiated by:  [facility]  Subjective/Objective Assessment:   alcohol abuse, depression     Action/Plan:   daughter assist with his care, Madelon Lips # [phone]   Anticipated DC Date:  10/30/2011   Anticipated DC Plan:  HOME W HOME HEALTH SERVICES      DC Planning Services  CM consult      [facility] Choice  HOME HEALTH   Choice offered to / List presented to:  C-4 Adult Children        HH arranged  HH-2 PT  HH-1 RN  HH-4 NURSE'S AIDE      HH agency  [facility]   Status of service:  Completed, signed off Medicare Important Message given?   (If response is "NO", the following Medicare IM given date fields will be blank) Date Medicare IM given:   Date Additional Medicare IM given:    Discharge Disposition:  HOME W HOME HEALTH SERVICES  Per UR Regulation:    If discussed at Long Length of Stay Meetings, dates discussed:    Comments:  10/29/2011 1400 Spoke to pt and states his daughter assist with his care. Gave permission to speak with daughter. Has all DME at home and currently does not need any DME. Did not have a preference for [facility]. Provided choice to pt and dtr. Will follow up [facility] for start of care date on 7/29. Faxed referral for HH, facesheet, and F2F to [facility].  Isidoro Donning RN CCM Case Mgmt phone [phone]

## 2011-10-29 NOTE — Progress Notes (Signed)
SUBJECTIVE:  No chest pain or SHOB.  Mild right groin pain post cath.  No lightheadedness.    Wants to go home.  OBJECTIVE:   Vitals:   Filed Vitals:   10/28/11 0635 10/28/11 1300 10/28/11 2040 10/29/11 0426  BP: 134/68 124/66 133/62 101/52  Pulse: 83 78 81 74  Temp: 98.5 F (36.9 C) 98.1 F (36.7 C) 98.1 F (36.7 C) 98 F (36.7 C)  TempSrc: Oral Oral Oral Axillary  Resp: 20 20 18 18   Height:      Weight: 75.841 kg (167 lb 3.2 oz)   74.753 kg (164 lb 12.8 oz)  SpO2: 96% 96% 100% 93%   I&O's:    Intake/Output Summary (Last 24 hours) at 10/29/11 1115 Last data filed at 10/29/11 1610  Gross per 24 hour  Intake    840 ml  Output    702 ml  Net    138 ml   TELEMETRY: Reviewed telemetry pt in NSR:     PHYSICAL EXAM General: frail Head:    Normal cephalic and atramatic  Lungs: No wheezing Heart:   HRRR S1 S2 3/6 systolic murmur Abdomen:  abdomen soft and non-tender Msk:  Back normal, normal gait. Normal strength and tone for age. Extremities:   No  edema.  Right DP +2; no right groin hematoma; increased area of bruising Neuro: Alert and oriented Psych:  Normal affect, responds appropriately   LABS: Basic Metabolic Panel:  Basename 10/28/11 0620 10/27/11 0635  NA 141 141  K 3.7 3.6  CL 100 100  CO2 34* 34*  GLUCOSE 115* 107*  BUN 8 11  CREATININE 0.87 0.92  CALCIUM 8.6 8.6  MG -- --  PHOS -- --   Liver Function Tests: No results found for this basename: AST:2,ALT:2,ALKPHOS:2,BILITOT:2,PROT:2,ALBUMIN:2 in the last 72 hours No results found for this basename: LIPASE:2,AMYLASE:2 in the last 72 hours CBC: No results found for this basename: WBC:2,NEUTROABS:2,HGB:2,HCT:2,MCV:2,PLT:2 in the last 72 hours Cardiac Enzymes: No results found for this basename: CKTOTAL:3,CKMB:3,CKMBINDEX:3,TROPONINI:3 in the last 72 hours BNP: No components found with this basename: POCBNP:3 D-Dimer: No results found for this basename: DDIMER:2 in the last 72 hours Hemoglobin  A1C: No results found for this basename: HGBA1C in the last 72 hours Fasting Lipid Panel: No results found for this basename: CHOL,HDL,LDLCALC,TRIG,CHOLHDL,LDLDIRECT in the last 72 hours Thyroid Function Tests: No results found for this basename: TSH,T4TOTAL,FREET3,T3FREE,THYROIDAB in the last 72 hours Anemia Panel: No results found for this basename: VITAMINB12,FOLATE,FERRITIN,TIBC,IRON,RETICCTPCT in the last 72 hours Coag Panel:   Lab Results  Component Value Date   INR 1.07 10/25/2011   INR 1.0 03/06/2011   INR 0.9 07/19/2007    RADIOLOGY: Ct Head Wo Contrast  10/24/2011  *RADIOLOGY REPORT*  Clinical Data: Altered mental status.  CT HEAD WITHOUT CONTRAST  Technique:  Contiguous axial images were obtained from the base of the skull through the vertex without contrast.  Comparison: 12/13/2010  Findings: Stable atrophy and mild small vessel disease. The brain demonstrates no evidence of hemorrhage, infarction, edema, mass effect, extra-axial fluid collection, hydrocephalus or mass lesion. The skull is unremarkable.  IMPRESSION: No acute abnormalities by CT.  Stable atrophy and small vessel disease.  Original Report Authenticated By: Reola Calkins, M.D.   Dg Chest Port 1 View  10/24/2011  *RADIOLOGY REPORT*  Clinical Data: Altered mental status  PORTABLE CHEST - 1 VIEW  Comparison: 12/13/2010  Findings: Severe dextroscoliosis in the thoracic spine is unchanged.  Lungs are clear without infiltrate or effusion.  Negative for heart failure or pneumonia.  IMPRESSION: No acute cardiopulmonary disease.  Original Report Authenticated By: Camelia Phenes, M.D.      ASSESSMENT: Severe aortic stenosis.  No significant CAD.  Normal LV function in the past.  PLAN:  Dr. Cornelius Moras saw regarding possible treatment options for the aortic stenosis.  No symptoms this morning.  Alcohol abuse has been an issue as well. More testing ordered, PFTs, carotid Dopplers to better risk stratify.  Per admission H&P, home  situation is likely suboptimal.  Will need some discharge planning as well.     Corky Crafts., MD  10/29/2011  11:15 AM

## 2011-10-30 ENCOUNTER — Inpatient Hospital Stay (HOSPITAL_COMMUNITY): Payer: Medicare Other

## 2011-10-30 DIAGNOSIS — I359 Nonrheumatic aortic valve disorder, unspecified: Secondary | ICD-10-CM

## 2011-10-30 DIAGNOSIS — Z01810 Encounter for preprocedural cardiovascular examination: Secondary | ICD-10-CM

## 2011-10-30 LAB — PULMONARY FUNCTION TEST

## 2011-10-30 MED ORDER — ALBUTEROL SULFATE (5 MG/ML) 0.5% IN NEBU
2.5000 mg | INHALATION_SOLUTION | Freq: Once | RESPIRATORY_TRACT | Status: AC
  Administered 2011-10-30: 2.5 mg via RESPIRATORY_TRACT

## 2011-10-30 MED ORDER — ASPIRIN 81 MG PO CHEW: 81.0000 mg | CHEWABLE_TABLET | Freq: Every day | ORAL | Status: DC

## 2011-10-30 MED ORDER — FUROSEMIDE 40 MG PO TABS: 40.0000 mg | ORAL_TABLET | Freq: Every day | ORAL | Status: DC

## 2011-10-30 MED ORDER — ADULT MULTIVITAMIN W/MINERALS CH: 1.0000 | ORAL_TABLET | Freq: Every day | ORAL | Status: AC

## 2011-10-30 MED ORDER — FOLIC ACID 1 MG PO TABS: 1.0000 mg | ORAL_TABLET | Freq: Every day | ORAL | Status: AC

## 2011-10-30 NOTE — Progress Notes (Signed)
Pt will benefit from HHOT. Pt agreeable.  Thanks, Lise Auer

## 2011-10-30 NOTE — Progress Notes (Signed)
VASCULAR LAB PRELIMINARY  PRELIMINARY  PRELIMINARY  PRELIMINARY  Pre-op Cardiac Surgery  Carotid Findings:  Bilateral:  No evidence of hemodynamically significant internal carotid artery stenosis.   Vertebral artery flow is antegrade.      Upper Extremity Right Left  Brachial Pressures 132 triphasic 128 triphasic  Radial Waveforms triphasic triphasic  Ulnar Wavefortriphasicms triphasic triphasic  Palmar Arch (Allen's Test) Within normal limits Within normal limits     Loann Chahal, RVT 10/30/2011, 9:23 AM

## 2011-10-30 NOTE — Progress Notes (Signed)
1415 discharge instructions and prescriptions given to pt/ 1430 wheeled to lobby by NT . With family awaiting

## 2011-10-30 NOTE — Progress Notes (Signed)
Occupational Therapy Treatment Patient Details Name: Samuel [name] MRN: [id number] DOB: 05-Dec-1926 Today's Date: 10/30/2011 Time: 4098-1191    OT Assessment / Plan / Recommendation                             ADL  Grooming: Performed;Min guard Where Assessed - Grooming: Unsupported standing Lower Body Dressing: Performed;Minimal assistance Where Assessed - Lower Body Dressing: Unsupported sit to stand Toilet Transfer: Performed;Min guard Acupuncturist: Regular height toilet Toileting - Clothing Manipulation and Hygiene: Performed;Min guard Where Assessed - Glass blower/designer Manipulation and Hygiene: Standing ADL Comments: Pt overall min A/ Pt states daugther will be of assist when he gets home/ Feel pt at night risk to fall, but pt insisting on returning home/      O  OT Goals    Visit Information  Last OT Received On: 10/30/11    Subjective Data  Subjective: i am leaving today.         Mobility Transfers Transfers: Sit to Stand;Stand to Sit Sit to Stand: 4: Min guard;With armrests;From chair/3-in-1 Stand to Sit: 4: Min guard;To chair/3-in-1         End of Session OT - End of Session Activity Tolerance: Patient tolerated treatment well Patient left: in chair  GO     Samuel [name], [place]ung 10/30/2011, 12:13 PM

## 2011-10-30 NOTE — Progress Notes (Signed)
Patient Name: Samuel [name] Date of Encounter: 10/30/2011     Active Problems:  ALCOHOL ABUSE  DEPRESSION  Essential hypertension, benign  AORTIC STENOSIS  COPD  DIZZINESS, CHRONIC  HEART MURMUR  DYSPNEA ON EXERTION  Alcohol intoxication  Physical deconditioning  Generalized weakness  Altered mental status  Fever and chills  Dysphagia    SUBJECTIVE  "I feel the strongest I have in weeks."  Ambulating using walker with PT.  DOE is stable.  No chest pain.  CURRENT MEDS    . albuterol  2.5 mg Nebulization Once  . amLODipine  10 mg Oral Daily  . antiseptic oral rinse  15 mL Mouth Rinse BID  . aspirin  81 mg Oral Daily  . folic acid  1 mg Oral Daily  . furosemide  40 mg Oral Daily  . multivitamin with minerals  1 tablet Oral Daily  . omega-3 acid ethyl esters  1 g Oral Daily  . pantoprazole  40 mg Oral Q0600  . sodium chloride  3 mL Intravenous Q12H  . thiamine  100 mg Oral Daily   Or  . thiamine  100 mg Intravenous Daily    OBJECTIVE  Filed Vitals:   10/29/11 1338 10/29/11 2218 10/30/11 0505 10/30/11 1050  BP: 131/61 144/62 112/56 120/70  Pulse: 88 79 74 75  Temp: 98.5 F (36.9 C) 98.1 F (36.7 C) 98.7 F (37.1 C)   TempSrc: Oral Oral Oral   Resp: 20 18 20 18   Height:      Weight:   166 lb 10.7 oz (75.6 kg)   SpO2: 97% 94% 95% 97%    Intake/Output Summary (Last 24 hours) at 10/30/11 1143 Last data filed at 10/30/11 1000  Gross per 24 hour  Intake   1140 ml  Output    850 ml  Net    290 ml   Filed Weights   10/28/11 0635 10/29/11 0426 10/30/11 0505  Weight: 167 lb 3.2 oz (75.841 kg) 164 lb 12.8 oz (74.753 kg) 166 lb 10.7 oz (75.6 kg)    PHYSICAL EXAM  General: Pleasant, NAD. Neuro: Alert and oriented X 3. Moves all extremities spontaneously. Psych: Normal affect. HEENT:  Normal  Neck: Supple without bruits or JVD. Lungs:  Resp regular and unlabored, CTA. Heart: RRR no s3, s4, 2/6 SEM RUSB. Abdomen: Soft, non-tender, non-distended, BS +  x 4.  Extremities: No clubbing, cyanosis or edema. DP/PT/Radials 2+ and equal bilaterally.  Accessory Clinical Findings  Basic Metabolic Panel  Basename 10/28/11 0620  NA 141  K 3.7  CL 100  CO2 34*  GLUCOSE 115*  BUN 8  CREATININE 0.87  CALCIUM 8.6  MG --  PHOS --    TELE  rsr  Radiology/Studies  Ct Angio Abd/pel W/ And/or W/o  10/29/2011  *RADIOLOGY REPORT*  Clinical Data:  Rule out aneurysm or dissection.  Preop evaluation.  CT ANGIOGRAPHY CHEST, ABDOMEN AND PELVIS    CTA CHEST  Findings:    IMPRESSION: The thoracic aorta is very tortuous and associated with the severe thoracic scoliosis.  There is no evidence for aortic dissection or significant dilatation.  Great vessels are patent.  The aortic valve is heavily calcified.  There is marked volume loss in the right lower lobe with focal scarring or consolidation.  The right lower lobe bronchus appears to be compressed and probably related to the scoliosis.  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  Close to 50% narrowing of the proximal left common iliac artery. Otherwise, there is no significant stenosis involving the iliac arteries.  Plaque and calcification at the origin of the visceral arteries.  Extensive colonic diverticulosis.  These results were called by telephone on 10/29/2011 at 3:15 p.m. to Dr. Cornelius Moras, who verbally acknowledged these results.  Original Report Authenticated By: Richarda Overlie, M.D.    ASSESSMENT AND PLAN  1.  Severe Aortic Stenosis:  S/p PFT's (results pending) and carotid dopplers (no hemodynamically significant stenoses) this AM.  Pt eager to go home and f/u with Dr. Cornelius Moras as an outpt.  Volume status looks good.  HR/BP stable.  No significant CAD on cath.  2.  Volume overload:  In setting of above.  Volume stable.  Weight stable.  Net neg of 1873 this  admission.  HR/BP stable.  Cont current diuretic dose.  3.  ETOH abuse:  Pt says he has given it up for good.  No signs/Ss of DT's.  4.  Deconditioning:  Seen by PT who rec SNF but pt prefers to go home with home health, which is in the process of being arranged.  His dtr is apparently nearby but is not planning to provide 24hr supervision @ d/c.  We will arrange for f/u in our office in a few wks.  Signed, Nicolasa Ducking NP.

## 2011-10-30 NOTE — Progress Notes (Signed)
PT Cancel Note:   Pt leaving floor for procedure.  Will attempt back later today if time allows.    Verdell Face, [place] [phone] 10/30/2011

## 2011-10-30 NOTE — Progress Notes (Signed)
Clinical Social Worker will sign off for now as social work intervention is no longer needed. Please consult us again if new need arises.   Jermey Closs, MSW, LCSWA [phone] 

## 2011-10-30 NOTE — Progress Notes (Signed)
I went by to see this patient for daily rounds and he had RE been discharged from the hospital. I reviewed the plan and it appears he will followup with Dr. Cornelius Moras as an outpatient.

## 2011-10-30 NOTE — Discharge Summary (Signed)
Physician Discharge Summary  [name] ZOX:[id number] DOB: May 09, 1926 DOA: 10/24/2011  PCP: Ralene Ok, MD  Admit date: 10/24/2011 Discharge date: 10/30/2011  Recommendations for Outpatient Follow-up:  1. Please follow up with your Cardiothoracic Surgeon so you can discuss your recent test results and treatment options given your Aortic Stenosis. 2. Also follow up with your primary care physician for anxiety.  Discharge Diagnoses:  Active Problems:  ALCOHOL ABUSE  DEPRESSION  Essential hypertension, benign  AORTIC STENOSIS  COPD  DIZZINESS, CHRONIC  HEART MURMUR  DYSPNEA ON EXERTION  Alcohol intoxication  Physical deconditioning  Generalized weakness  Altered mental status  Fever and chills  Dysphagia   Discharge Condition: Stable  Diet recommendation: Heart Healthy  Wt Readings from Last 3 Encounters:  10/30/11 75.6 kg (166 lb 10.7 oz)  10/30/11 75.6 kg (166 lb 10.7 oz)  10/24/11 55.792 kg (123 lb)    History of present illness:  From original HPI: Samuel [name] is an 78 y.o. male [name] is a gentleman with a history of aortic stenosis as well as alcohol abuse. He was seen by his cardiologist today for followup and apparently had declined significantly and was not doing well. He has had progressive weakness and physical deconditioning over the past 2 months. Reportedly in May 2013 pt came down with a viral infection associated with severe malaise, fever and chills. He has not been able to get out of bed for the last 4 weeks. He also reports that he has been drinking large amounts of alcohol over that time. He cannot quantify the amount he has been drinking. He reports persistent fever and chills and malaise. He denies chest pain and shortness of breath. He arrived to the hospital poorly groomed, significantly weakened and lethargic (possibly under the influence of alcohol) and had feces on his feet and no shoes.   Recent cardiac history: Myoview, Aug 28, 2007: no  ischemia or infarction and his ejection fraction was 65%. echocardiogram Dec 2012 showed normal LV function and severe aortic stenosis with a mean gradient of 57 mm mercury; mild AI. Had discussed aortic valve replacement in Dec 2012 and he was in agreement. We arranged an outpatient cardiac catheterization and appointment with cardiothoracic surgery but he canceled both and declined further intervention at last cardiology OV.    Hospital Course:  1. Aortic Stenosis/heart murmur/CAD - While in house patient was seen and evaluated by Cardiology and Cardiothoracic surgeon.  -Patient had echo which suggested moderate AS but most likely underestimated as previous echos with much higher gradients (echo 12-12 with mean gradient of 57 mmHg and peak velocity of 4.8 m/s.  Patient was placed on lasix 40 mg po daily.  And given patient's symptoms and these findings cath was ordered -  Cath conclusion showed:  1. Severe AS by multiple echoes, with AI by angio  2. No significant coronary obstruction  3. Unable to get to RA from the femoral approach due to severe scoliosis  Subsequently Cardiothoracic surgeon was consulted.  Please refer to note on 7/27 for further details but his plan was the following:   The patient was counseled at length regarding treatment alternatives for management of severe symptomatic aortic stenosis. The somewhat high-risk nature of surgical intervention has been discussed in detail as related to the patient's current condition. Long-term prognosis with medical therapy was discussed. Alternative approaches such as conventional aortic valve replacement, transcatheter aortic valve replacement, and palliative medical therapy were compared and contrasted at length. This discussion was  placed in the context of the patient's own specific clinical presentation and past medical history. All of his questions have been addressed. The patient desires to think matters over further before making any  decisions. We will obtain pulmonary function tests to further stratify the patient's risks associated with surgical intervention. We will try to meet with the patient's family to discuss matters, and we will review options with Dr Jens Som, Dr Riley Kill and Dr Excell Seltzer.  Other orders were placed by cardiothoracic surgeon to further risk stratify patient (CT of abdomen/pelvis, Carotid dopplers, PFT's)  - PT has recommended SNF for placement. I have discussed with patient at length (Which nursing and social worker reinforced) and he does not want to go to SNF upon discharge. As such will set patient up for home health with home health aide, nursing, and physical therapy. Have strongly advised SNF but patient still wishes to go home.    2. ETOH abuse:  -Patient while in house was placed on CIWA protocol - Today has not received any benzodiazepines and shows no signs of anxiety or withdrawal symptoms. - He reassures me that he will not continue to drink alcohol.  3. HTN: Stable continue current regimen once transitioned out of the hospital.  4. Generalized weakness/dyspnea on exertion:  -Likely related to number one.  - F/u with cardiology and cardiothoracic surgeon as outpatient.  Have discussed with patient and his daughter. - Cardiac enzymes x 3 negative    Procedures:  Cath  Echocardiogram  CT abdomen/pelvis  PFT's  Carotid doppler  Consultations:  Cardiothoracic surgeon  Cardiologist  Discharge Exam: Filed Vitals:   10/30/11 1050  BP: 120/70  Pulse: 75  Temp:   Resp: 18   Filed Vitals:   10/29/11 1338 10/29/11 2218 10/30/11 0505 10/30/11 1050  BP: 131/61 144/62 112/56 120/70  Pulse: 88 79 74 75  Temp: 98.5 F (36.9 C) 98.1 F (36.7 C) 98.7 F (37.1 C)   TempSrc: Oral Oral Oral   Resp: 20 18 20 18   Height:      Weight:   75.6 kg (166 lb 10.7 oz)   SpO2: 97% 94% 95% 97%    General: Pt in NAD, A and O x 3 Cardiovascular: systolic murmur, RRR  Respiratory: CTA,  no wheezes  Abdomen: soft, nt, nd Discharge Instructions  Discharge Orders    Future Orders Please Complete By Expires   Diet - low sodium heart healthy      Increase activity slowly      Discharge instructions      Comments:   Please follow up with your cardiothoracic surgeon Dr. Tressie Stalker. Call [phone] to confirm your follow up appointment as per your discussion with your cardiothoracic surgeon.  Also please abstain from drinking alcohol  Also please follow up with your primary care physician and Cardiologist in 1-2 weeks or sooner should any new concerns arise.   Call MD for:  extreme fatigue      Call MD for:  difficulty breathing, headache or visual disturbances        Medication List  As of 10/30/2011 11:42 AM   STOP taking these medications         ALPRAZolam 0.5 MG tablet         TAKE these medications         amLODipine 10 MG tablet   Commonly known as: NORVASC   Take 10 mg by mouth daily.      aspirin 81 MG chewable tablet  Chew 1 tablet (81 mg total) by mouth daily.      folic acid 1 MG tablet   Commonly known as: FOLVITE   Take 1 tablet (1 mg total) by mouth daily.      furosemide 40 MG tablet   Commonly known as: LASIX   Take 1 tablet (40 mg total) by mouth daily.      multivitamin with minerals Tabs   Take 1 tablet by mouth daily.      omega-3 acid ethyl esters 1 G capsule   Commonly known as: LOVAZA   Take 1 g by mouth.      vitamin B-12 100 MCG tablet   Commonly known as: CYANOCOBALAMIN   Take 50 mcg by mouth daily.         ASK your doctor about these medications         OIL OF OREGANO PO   Take by mouth as directed.           Follow-up Information    Follow up with [facility]. [facility] RN, Physical Therapy and aide)    Contact information:   [phone]          The results of significant diagnostics from this hospitalization (including imaging, microbiology, ancillary and laboratory) are listed below  for reference.    Significant Diagnostic Studies: Dg Chest 2 View  10/29/2011  *RADIOLOGY REPORT*  Clinical Data: Aortic stenosis.  CHEST - 2 VIEW  Comparison: 10/24/2011 and prior chest radiographs dating back to 2009  Findings: Cardiomegaly is again noted. Mediastinal silhouette is unchanged. There is no evidence of focal airspace disease, pulmonary edema, suspicious pulmonary nodule/mass, pleural effusion, or pneumothorax. No acute bony abnormalities are identified. A severe thoracic scoliosis is present.  IMPRESSION: Cardiomegaly without evidence of acute cardiopulmonary disease.  Severe thoracic scoliosis.  Original Report Authenticated By: Rosendo Gros, M.D.   Ct Head Wo Contrast  10/24/2011  *RADIOLOGY REPORT*  Clinical Data: Altered mental status.  CT HEAD WITHOUT CONTRAST  Technique:  Contiguous axial images were obtained from the base of the skull through the vertex without contrast.  Comparison: 12/13/2010  Findings: Stable atrophy and mild small vessel disease. The brain demonstrates no evidence of hemorrhage, infarction, edema, mass effect, extra-axial fluid collection, hydrocephalus or mass lesion. The skull is unremarkable.  IMPRESSION: No acute abnormalities by CT.  Stable atrophy and small vessel disease.  Original Report Authenticated By: Reola Calkins, M.D.   Ct Angio Chest W/cm &/or Wo Cm  10/29/2011  *RADIOLOGY REPORT*  Clinical Data:  Rule out aneurysm or dissection.  Preop evaluation.  CT ANGIOGRAPHY CHEST, ABDOMEN AND PELVIS  Technique:  Multidetector CT imaging through the chest, abdomen and pelvis was performed using the standard protocol during bolus administration of intravenous contrast.  Multiplanar reconstructed images including MIPs were obtained and reviewed to evaluate the vascular anatomy.  Contrast: OMNIPAQUE IOHEXOL 350 MG/ML SOLN  Comparison:  Chest radiograph 10/29/2011  CTA CHEST  Findings:  The patient has severe S-shaped scoliosis of the thoracic spine.   There is no evidence for a thoracic aortic dissection or intramural hematoma.  The thoracic aorta is very tortuous and related to the severe scoliosis.  There are extensive calcifications involving the aortic valve.  Small calcifications involving the left anterior descending coronary artery.  No significant pericardial or pleural fluid.  The ascending thoracic aorta measures up to 3.5 cm.  The great vessels are patent. Limited evaluation of the right common carotid artery  origin due to artifact from the adjacent vein and contrast.  Descending thoracic aorta roughly measures 2.8 cm.  No evidence for a large pulmonary embolism.  There is material within the mainstem bronchus which most likely represents mucus.  There is narrowing and compression of the right lower lobe bronchus which may be associated with the scoliosis and there is volume loss within the right lower lobe.  Densities in the right lower lobe could represent atelectasis or scarring. Otherwise, the lungs are clear.   Review of the MIP images confirms the above findings.  IMPRESSION: The thoracic aorta is very tortuous and associated with the severe thoracic scoliosis.  There is no evidence for aortic dissection or significant dilatation.  Great vessels are patent.  The aortic valve is heavily calcified.  There is marked volume loss in the right lower lobe with focal scarring or consolidation.  The right lower lobe bronchus appears to be compressed and probably related to the scoliosis.  CTA ABDOMEN AND PELVIS  Findings:  Vascular findings:  There is plaque and narrowing at the origin of the celiac trunk.  Left gastric artery may be originating from the aorta or near the origin of celiac trunk.  The origin of the superior mesenteric artery is heavily calcified.  The distal SMA is patent.  Calcifications at the origin of the renal arteries which are patent bilaterally.  There is an eccentric aneurysm involving the right posterior aspect of the infrarenal  abdominal aorta.   The aortic aneurysm measures 3.7 x 3.4 cm.  The posterior aortic lumen is very irregular at the level of the aneurysm.   The aneurysm contains mural thrombus.  The configuration of this aneurysm is unusual and raises concern for a pseudoaneurysm, possibly secondary to a penetrating ulcer.  The inferior mesenteric artery is patent.  There is close to 50% luminal narrowing of the proximal left common iliac artery.  There is edema in the right groin related to previous catheterization. External iliac arteries and femoral arteries are widely patent.  Minimal plaque involving the common femoral arteries.  Nonvascular structures: No gross abnormality to the liver, gallbladder, spleen, kidneys or adrenal tissue.  Evidence for a cyst involving the right kidney upper pole.  No gross abnormality to the pancreas.  No significant free fluid or lymphadenopathy. Urinary bladder is decompressed.   No gross abnormality to the prostate.  There is extensive colonic diverticulosis.   Review of the MIP images confirms the above findings.  IMPRESSION: Eccentric aneurysm involving infrarenal abdominal aorta, measures up to 3.7 cm.  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  Close to 50% narrowing of the proximal left common iliac artery. Otherwise, there is no significant stenosis involving the iliac arteries.  Plaque and calcification at the origin of the visceral arteries.  Extensive colonic diverticulosis.  These results were called by telephone on 10/29/2011 at 3:15 p.m. to Dr. Cornelius Moras, who verbally acknowledged these results.  Original Report Authenticated By: Richarda Overlie, M.D.   Dg Chest Port 1 View  10/24/2011  *RADIOLOGY REPORT*  Clinical Data: Altered mental status  PORTABLE CHEST - 1 VIEW  Comparison: 12/13/2010  Findings: Severe dextroscoliosis in the thoracic spine is unchanged.  Lungs are clear without infiltrate or effusion.  Negative for heart failure or pneumonia.   IMPRESSION: No acute cardiopulmonary disease.  Original Report Authenticated By: Camelia Phenes, M.D.   Ct Angio Abd/pel W/ And/or W/o  10/29/2011  *RADIOLOGY REPORT*  Clinical Data:  Rule out  aneurysm or dissection.  Preop evaluation.  CT ANGIOGRAPHY CHEST, ABDOMEN AND PELVIS  Technique:  Multidetector CT imaging through the chest, abdomen and pelvis was performed using the standard protocol during bolus administration of intravenous contrast.  Multiplanar reconstructed images including MIPs were obtained and reviewed to evaluate the vascular anatomy.  Contrast: OMNIPAQUE IOHEXOL 350 MG/ML SOLN  Comparison:  Chest radiograph 10/29/2011  CTA CHEST  Findings:  The patient has severe S-shaped scoliosis of the thoracic spine.  There is no evidence for a thoracic aortic dissection or intramural hematoma.  The thoracic aorta is very tortuous and related to the severe scoliosis.  There are extensive calcifications involving the aortic valve.  Small calcifications involving the left anterior descending coronary artery.  No significant pericardial or pleural fluid.  The ascending thoracic aorta measures up to 3.5 cm.  The great vessels are patent. Limited evaluation of the right common carotid artery origin due to artifact from the adjacent vein and contrast.  Descending thoracic aorta roughly measures 2.8 cm.  No evidence for a large pulmonary embolism.  There is material within the mainstem bronchus which most likely represents mucus.  There is narrowing and compression of the right lower lobe bronchus which may be associated with the scoliosis and there is volume loss within the right lower lobe.  Densities in the right lower lobe could represent atelectasis or scarring. Otherwise, the lungs are clear.   Review of the MIP images confirms the above findings.  IMPRESSION: The thoracic aorta is very tortuous and associated with the severe thoracic scoliosis.  There is no evidence for aortic dissection or  significant dilatation.  Great vessels are patent.  The aortic valve is heavily calcified.  There is marked volume loss in the right lower lobe with focal scarring or consolidation.  The right lower lobe bronchus appears to be compressed and probably related to the scoliosis.  CTA ABDOMEN AND PELVIS  Findings:  Vascular findings:  There is plaque and narrowing at the origin of the celiac trunk.  Left gastric artery may be originating from the aorta or near the origin of celiac trunk.  The origin of the superior mesenteric artery is heavily calcified.  The distal SMA is patent.  Calcifications at the origin of the renal arteries which are patent bilaterally.  There is an eccentric aneurysm involving the right posterior aspect of the infrarenal abdominal aorta.   The aortic aneurysm measures 3.7 x 3.4 cm.  The posterior aortic lumen is very irregular at the level of the aneurysm.   The aneurysm contains mural thrombus.  The configuration of this aneurysm is unusual and raises concern for a pseudoaneurysm, possibly secondary to a penetrating ulcer.  The inferior mesenteric artery is patent.  There is close to 50% luminal narrowing of the proximal left common iliac artery.  There is edema in the right groin related to previous catheterization. External iliac arteries and femoral arteries are widely patent.  Minimal plaque involving the common femoral arteries.  Nonvascular structures: No gross abnormality to the liver, gallbladder, spleen, kidneys or adrenal tissue.  Evidence for a cyst involving the right kidney upper pole.  No gross abnormality to the pancreas.  No significant free fluid or lymphadenopathy. Urinary bladder is decompressed.   No gross abnormality to the prostate.  There is extensive colonic diverticulosis.   Review of the MIP images confirms the above findings.  IMPRESSION: Eccentric aneurysm involving infrarenal abdominal aorta, measures up to 3.7 cm.  The configuration raises concern  for a  pseudoaneurysm at this location.  Close to 50% narrowing of the proximal left common iliac artery. Otherwise, there is no significant stenosis involving the iliac arteries.  Plaque and calcification at the origin of the visceral arteries.  Extensive colonic diverticulosis.  These results were called by telephone on 10/29/2011 at 3:15 p.m. to Dr. Cornelius Moras, who verbally acknowledged these results.  Original Report Authenticated By: Richarda Overlie, M.D.    Microbiology: Recent Results (from the past 240 hour(s))  CULTURE, BLOOD (ROUTINE X 2)     Status: Normal (Preliminary result)   Collection Time   10/24/11  6:45 PM      Component Value Range Status Comment   Specimen Description BLOOD LEFT ARM   Final    Special Requests BOTTLES DRAWN AEROBIC AND ANAEROBIC 10CC   Final    Culture  Setup Time 10/25/2011 02:40   Final    Culture     Final    Value:        BLOOD CULTURE RECEIVED NO GROWTH TO DATE CULTURE WILL BE HELD FOR 5 DAYS BEFORE ISSUING A FINAL NEGATIVE REPORT   Report Status PENDING   Incomplete   CULTURE, BLOOD (ROUTINE X 2)     Status: Normal (Preliminary result)   Collection Time   10/24/11  7:00 PM      Component Value Range Status Comment   Specimen Description BLOOD RIGHT ARM   Final    Special Requests BOTTLES DRAWN AEROBIC AND ANAEROBIC 10CC   Final    Culture  Setup Time 10/25/2011 02:40   Final    Culture     Final    Value:        BLOOD CULTURE RECEIVED NO GROWTH TO DATE CULTURE WILL BE HELD FOR 5 DAYS BEFORE ISSUING A FINAL NEGATIVE REPORT   Report Status PENDING   Incomplete   URINE CULTURE     Status: Normal   Collection Time   10/24/11 10:40 PM      Component Value Range Status Comment   Specimen Description URINE, RANDOM   Final    Special Requests Immunocompromised   Final    Culture  Setup Time 10/25/2011 05:54   Final    Colony Count NO GROWTH   Final    Culture NO GROWTH   Final    Report Status 10/26/2011 FINAL   Final      Labs: Basic Metabolic Panel:  Lab  10/28/11 0620 10/27/11 0635 10/26/11 0520 10/25/11 0251 10/24/11 1935  NA 141 141 142 142 141  K 3.7 3.6 3.2* 3.7 4.4  CL 100 100 97 98 101  CO2 34* 34* 35* 28 25  GLUCOSE 115* 107* 77 85 88  BUN 8 11 14 10 10   CREATININE 0.87 0.92 0.99 0.87 0.80  CALCIUM 8.6 8.6 8.8 8.9 8.5  MG -- -- -- -- 1.9  PHOS -- -- -- -- 3.8   Liver Function Tests:  Lab 10/25/11 0251 10/24/11 1935  AST 38* 44*  ALT 14 15  ALKPHOS 64 61  BILITOT 0.8 0.5  PROT 7.6 7.6  ALBUMIN 3.7 3.6   No results found for this basename: LIPASE:5,AMYLASE:5 in the last 168 hours  Lab 10/24/11 1902  AMMONIA 23   CBC:  Lab 10/26/11 0520 10/25/11 0251 10/24/11 1935  WBC 4.3 4.9 4.0  NEUTROABS -- -- 1.9  HGB 14.7 15.1 15.1  HCT 42.4 42.6 42.8  MCV 94.2 92.4 93.2  PLT 127* 136* 141*   Cardiac  Enzymes:  Lab 10/25/11 1010 10/25/11 0243 10/24/11 1925  CKTOTAL 70 58 74  CKMB 3.1 2.9 2.6  CKMBINDEX -- -- --  TROPONINI <0.30 <0.30 <0.30   BNP: BNP (last 3 results)  Basename 10/25/11 0251 10/24/11 1935 12/14/10 0512  PROBNP 537.7* 732.1* 1061.0*   CBG: No results found for this basename: GLUCAP:5 in the last 168 hours  Time coordinating discharge: > 45 minutes minutes  Signed:  Penny Pia  [facility] 10/30/2011, 11:42 AM

## 2011-10-31 LAB — CULTURE, BLOOD (ROUTINE X 2)
Culture: NO GROWTH
Culture: NO GROWTH

## 2011-11-20 ENCOUNTER — Institutional Professional Consult (permissible substitution) (INDEPENDENT_AMBULATORY_CARE_PROVIDER_SITE_OTHER): Payer: Medicare Other | Admitting: Thoracic Surgery (Cardiothoracic Vascular Surgery)

## 2011-11-20 ENCOUNTER — Encounter: Payer: Self-pay | Admitting: Cardiology

## 2011-11-20 ENCOUNTER — Encounter: Payer: Self-pay | Admitting: Thoracic Surgery (Cardiothoracic Vascular Surgery)

## 2011-11-20 VITALS — BP 159/72 | HR 80 | Resp 18 | Ht 72.0 in | Wt 171.0 lb

## 2011-11-20 DIAGNOSIS — I359 Nonrheumatic aortic valve disorder, unspecified: Secondary | ICD-10-CM

## 2011-11-20 DIAGNOSIS — I350 Nonrheumatic aortic (valve) stenosis: Secondary | ICD-10-CM

## 2011-11-20 NOTE — Progress Notes (Signed)
[address]            [name] [address]          [phone]     CARDIOTHORACIC SURGERY CONSULTATION REPORT  Referring Provider is Herby Abraham, MD Primary Cardiologist is Olga Millers, MD PCP is Ralene Ok, MD  Chief Complaint  Patient presents with  . Aortic Stenosis    F/u from hospital consult for severe symptomatic aortic stenosis    HPI:  Patient is an 78 year old male from [place] with known severe symptomatic aortic stenosis whom I had the opportunity to evaluate in consultation on 10/28/2011 during his recent hospitalization.  The patient insisted on going home at that time but now returns to the office for further followup of severe aortic stenosis. He is accompanied by his daughter to the office today. He states that since he left the hospital he has noted a gradual progression of shortness of breath. He does note that he stopped taking Lasix but he didn't think he needed it any longer does his legs were no longer swollen. He ran out of Xanax which he thinks helps his acute exacerbations of shortness of breath. He states that he is not returned to drinking any alcohol whatsoever and that he has remained completely sober since hospital discharge.  The patient describes chronic exertional shortness of breath with intermittent acute exacerbations of resting shortness of breath. He notes that his breathing seems to be worse at night when he tried to sleep, in retrospect he thinks that perhaps his breathing is improved by sitting up in bed. He also reports some mild tightness across his chest that seems to be the related to physical activity. He has had chronic dizziness for many years but no syncope. His lower extremity swelling has started to reaccumulate.  Past Medical History  Diagnosis Date  . Depression   . GERD (gastroesophageal reflux disease)   . COPD (chronic obstructive pulmonary disease)   . Aortic stenosis   . Essential  hypertension, benign   . Decreased libido   . DIZZINESS, CHRONIC   . Alcohol abuse   . Shortness of breath 10/24/11    "all the time"  . Arthritis     "in the low vertebra"  . Chronic lower back pain   . Scoliosis/kyphoscoliosis 10/28/2011  . Abdominal aortic aneurysm 10/29/2011    3.7cm AAA by CT scan with unusual appearance suggestive of penetrating atherosclerotic ulcer or localized dissection    Past Surgical History  Procedure Date  . No past surgeries     Family History  Problem Relation Age of Onset  . Other      no known medical hx of heart disease    Social History History  Substance Use Topics  . Smoking status: Former Smoker -- 2.5 years    Types: Cigarettes    Quit date: 07/02/1952  . Smokeless tobacco: Never Used  . Alcohol Use: 37.8 oz/week    42 Glasses of wine, 21 Cans of beer per week     10/24/11 "drink q day; quite a bit"; Started 1974 (mostly hard liquor)    Current Outpatient Prescriptions  Medication Sig Dispense Refill  . amLODipine (NORVASC) 10 MG tablet Take 10 mg by mouth daily.       Marland Kitchen aspirin 81 MG chewable tablet Chew 1 tablet (81 mg total) by mouth daily.  30 tablet  0  .  folic acid (FOLVITE) 1 MG tablet Take 1 tablet (1 mg total) by mouth daily.  30 tablet  0  . furosemide (LASIX) 40 MG tablet Take 1 tablet (40 mg total) by mouth daily.  30 tablet  0  . Multiple Vitamin (MULTIVITAMIN WITH MINERALS) TABS Take 1 tablet by mouth daily.  30 tablet  0  . omega-3 acid ethyl esters (LOVAZA) 1 G capsule Take 1 g by mouth.        . vitamin B-12 (CYANOCOBALAMIN) 100 MCG tablet Take 50 mcg by mouth daily.         Current Facility-Administered Medications  Medication Dose Route Frequency Provider Last Rate Last Dose  . DISCONTD: 0.9 %  sodium chloride infusion  250 mL Intravenous PRN Lewayne Bunting, MD      . DISCONTD: sodium chloride 0.9 % injection 3 mL  3 mL Intravenous Q12H Lewayne Bunting, MD      . DISCONTD: sodium chloride 0.9 % injection 3  mL  3 mL Intravenous PRN Lewayne Bunting, MD       Facility-Administered Medications Ordered in Other Visits  Medication Dose Route Frequency Provider Last Rate Last Dose  . 0.9 %  sodium chloride infusion  1 mL/kg/hr Intravenous Continuous Lewayne Bunting, MD        No Known Allergies  Review of Systems:  General:  decreased appetite, decreased energy   Respiratory:  no cough, no wheezing, no hemoptysis, no pain with inspiration or cough, + shortness of breath  Cardiac:  + chest pain or tightness, + exertional SOB, + resting SOB, + PND, + orthopnea, + LE edema, no palpitations, no syncope  GI:   + difficulty swallowing, no hematochezia, no hematemesis, no melena, no constipation, + diarrhea   GU:   no dysuria, no urgency, no frequency   Musculoskeletal: + arthritis, + arthralgia, some difficulty walking  Vascular:  no pain suggestive of claudication   Neuro:   no symptoms suggestive of TIA's, no seizures, no headaches, no peripheral neuropathy   Endocrine:  Negative   HEENT:  Edentulous with full dentures,  + recent blurry vision   Psych:   + anxiety, + depression    Physical Exam:   BP 159/72  Pulse 80  Resp 18  Ht 6' (1.829 m)  Wt 171 lb (77.565 kg)  BMI 23.19 kg/m2  SpO2 94%  General:  Elderly, frail-appearing  HEENT:  Unremarkable   Neck:   no JVD, no bruits, no adenopathy   Chest:   clear to auscultation, symmetrical breath sounds, no wheezes, no rhonchi, + severe chestwall deformity due to scoliosis  CV:   RRR, grade IV/VI systolic murmur   Abdomen:  soft, non-tender, no masses   Extremities:  warm, well-perfused, pulses not palpable, 2+ bilateral LE edema  Rectal/GU  Deferred  Neuro:   Grossly non-focal and symmetrical throughout  Skin:   Clean and dry, no rashes, + bruising, + hemosiderosis both LLE   Diagnostic Tests:  Transthoracic Echocardiography  Patient: Kody, Brandl MR #: [id number] Study Date: 10/25/2011 Gender: M Age: 57 Height:  185.4cm Weight: 75.4kg BSA: 1.69m^2 Pt. Status: Room: 4704  PERFORMING , [facility] SONOGRAPHER Cathie Beams ADMITTING Johnson, Clanford ORDERING Johnson, Clanford ATTENDING [place], [place] cc:  ------------------------------------------------------------ LV EF: 60% - 65%  ------------------------------------------------------------ Indications: Previous study 03/10/2011. Aortic stenosis 424.1.  ------------------------------------------------------------ History: PMH: ETOH abuse. Depression. Chronic dizziness. Murmur. Generalized weakness. Chronic obstructive pulmonary disease. Risk factors: Hypertension.  ------------------------------------------------------------ Study Conclusions  -  Left ventricle: The cavity size was normal. Wall thickness was increased in a pattern of mild LVH. Systolic function was normal. The estimated ejection fraction was in the range of 60% to 65%. Wall motion was normal; there were no regional wall motion abnormalities. Doppler parameters are consistent with abnormal left ventricular relaxation (grade 1 diastolic dysfunction). - Aortic valve: There was moderate stenosis. - Mitral valve: Moderately to severely calcified annulus. - Pulmonary arteries: PA peak pressure: 32mm Hg (S). - Impressions: Technically limited study due to poor sound wave transmission. Impressions:  - Technically limited study due to poor sound wave transmission. Transthoracic echocardiography. M-mode, complete 2D, spectral Doppler, and color Doppler. Height: Height: 185.4cm. Height: 73in. Weight: Weight: 75.4kg. Weight: 165.9lb. Body mass index: BMI: 21.9kg/m^2. Body surface area: BSA: 1.27m^2. Blood pressure: 136/67. Patient status: Inpatient. Location: Echo laboratory.  ------------------------------------------------------------  ------------------------------------------------------------ Left ventricle: The cavity size was normal. Wall thickness was  increased in a pattern of mild LVH. Systolic function was normal. The estimated ejection fraction was in the range of 60% to 65%. Wall motion was normal; there were no regional wall motion abnormalities. Doppler parameters are consistent with abnormal left ventricular relaxation (grade 1 diastolic dysfunction).  ------------------------------------------------------------ Aortic valve: Poorly visualized. Moderately calcified leaflets. Doppler: There was moderate stenosis. No significant regurgitation. VTI ratio of LVOT to aortic valve: 0.23. Peak velocity ratio of LVOT to aortic valve: 0.23. Mean velocity ratio of LVOT to aortic valve: 0.23. Mean gradient: 33mm Hg (S). Peak gradient: 61mm Hg (S).  ------------------------------------------------------------ Aorta: Aortic root: The aortic root was normal in size. Ascending aorta: The ascending aorta was normal in size.  ------------------------------------------------------------ Mitral valve: Moderately to severely calcified annulus. Leaflet separation was normal. Doppler: Transvalvular velocity was within the normal range. There was no evidence for stenosis. No significant regurgitation. Peak gradient: 2mm Hg (D).  ------------------------------------------------------------ Left atrium: The atrium was normal in size.  ------------------------------------------------------------ Right ventricle: The cavity size was normal. Wall thickness was normal. Systolic function was normal.  ------------------------------------------------------------ Pulmonic valve: Poorly visualized.  ------------------------------------------------------------ Tricuspid valve: Poorly visualized. Doppler: No significant regurgitation.  ------------------------------------------------------------ Right atrium: The atrium was normal in size.  ------------------------------------------------------------ Pericardium: There was no pericardial  effusion.  ------------------------------------------------------------  2D measurements Normal Doppler Normal Left ventricle measurements LVID ED, 31.5 mm 43-52 Main pulmonary chord, artery PLAX Pressure, S 32 mm =30 LVID ES, 18 mm 23-38 Hg chord, Left ventricle PLAX Ea, lat 4.39 cm/ ------- FS, chord, 43 % >29 ann, tiss s PLAX DP LVPW, ED 11.9 mm ------ E/Ea, lat 17.15 ------- IVS/LVPW 0.87 <1.3 ann, tiss ratio, ED DP Ventricular septum Ea, med 4.39 cm/ ------- IVS, ED 10.4 mm ------ ann, tiss s Aorta DP Root diam, 24 mm ------ E/Ea, med 17.15 ------- ED ann, tiss Left atrium DP AP dim 33 mm ------ LVOT AP dim 1.68 cm/m^2 <2.2 Peak vel, S 88.7 cm/ ------- index s Mean vel, S 61.3 cm/ ------- s VTI, S 20.8 cm ------- Aortic valve Peak vel, S 392 cm/ ------- s Mean vel, S 261 cm/ ------- s VTI, S 88.9 cm ------- Mean 33 mm ------- gradient, S Hg Peak 61 mm ------- gradient, S Hg VTI ratio 0.23 ------- LVOT/AV Peak vel 0.23 ------- ratio, LVOT/AV Vmean ratio 0.23 ------- LVOT/AV Mitral valve Peak E vel 75.3 cm/ ------- s Peak A vel 150 cm/ ------- s Deceleratio 282 ms 150-230 n time Peak 2 mm ------- gradient, D Hg Peak E/A 0.5 ------- ratio Tricuspid valve Regurg peak 259 cm/ ------- vel  s Peak RV-RA 27 mm ------- gradient, S Hg Systemic veins Estimated 5 mm ------- CVP Hg Right ventricle Pressure, S 32 mm <30 Hg Sa vel, lat 14.7 cm/ ------- ann, tiss s DP  ------------------------------------------------------------  Cardiac Catheterization Procedure Note  Name: GRADIE OHM  MRN: [id number]  DOB: [birth year]  Procedure: Right Heart Cath, Placement of catheters without left heart cath, Selective Coronary Angiography, aortic root angiography  Indication: Aortic stenosis  Procedural Details: The right groin was prepped, draped, and anesthetized with 1% lidocaine. Using the modified Seldinger technique a 6 French sheath was placed in the  right femoral artery and a 7 French sheath was placed in the right femoral vein. A Swan-Ganz catheter was used for the right heart catheterization. We were unable to get up beyond the RA due to marked scoliosis. Only RA pressure was measured. We did a hand injection. Standard Judkins catheters were used for selective coronary angiography and aortic root, but was difficult due to the tortuosity. . There were no immediate procedural complications. The patient was transferred to the post catheterization recovery area for further monitoring.  Procedural Findings:  Hemodynamics  RA 9 (?) if RA  RV not done  PA not done  PCWP not done  LV not done  AO 146/63 (97)  Oxygen saturations:  PA not done  AO 94% on O2  Cardiac Output (Fick) not done  Cardiac Index (Fick) not done  Coronary angiography:  Coronary dominance: right  Aortic root demonstrated a high calcified valve with minimal mobility. There was at least moderate AR, graded at 2-3 plus.  Left mainstem: short no obstruction  Left anterior descending (LAD): Mild irregularity without obstruction  Left circumflex (LCx): Minor luminal irregularity without obstruction.  Right coronary artery (RCA): Large caliber vessel with mild proximal irregularity, but no critical disease. Large PDA and PLA.  Left ventriculography: Left ventricular systolic function is normal, LVEF is estimated at 55-65%, there is no significant mitral regurgitation  Final Conclusions:  1. Severe AS by multiple echoes, with AI by angio  2. No significant coronary obstruction  3. Unable to get to RA from the femoral approach due to severe scoliosis  Recommendations:  1. Spoke with Dr. Elpidio Galea surgical consult  2. RHC from IJ with doppler if needed  3. ? AVR versus TAVR consideration. The latter would likely require transapical or more central approach. See catheter orientation on fluoro.  [name]  10/27/2011, 10:32 AM  I have personally reviewed the  patient's recent echocardiogram and cardiac catheterization. I agree that the patient probably has severe aortic stenosis, although peak velocity across the valve was less than 4 m/sec. Left ventricular systolic function remains normal. The gradient across the aortic valve was not assessed at catheterization. The patient does not have significant artery disease. Right heart catheterization was not done.   Pulmonary Function Tests (10/30/2011)  Baseline      Post-bronchodilator  FVC  2.12 L  (56% predicted) FVC  2.06 L  (55% predicted) FEV1  1.46 L  (55% predicted) FEV1  1.56 L  (59% predicted) FEF25-75 0.93 L  (53% predicted) FEF25-75 1.15 L  (66% predicted)  RV  2.99 L  (112% predicted) DLCO  46% predicted  Interpretation c/w moderate-severe COPD    STS Risk Calculator  Procedure AVR  Risk of Mortality 3.3%  Morbidity or Mortality 19%  Prolonged LOS 8%  Short LOS 30%  Permanent Stroke 1.4%  Prolonged Vent Support 11%  DSW Infection 0.4%  Renal Failure  5%  Reoperation 8%   Impression:  Patient has severe symptomatic aortic stenosis.  Overall I would agree that the patient's risks associated with conventional aortic valve replacement will be somewhat elevated because of his advanced age, his somewhat debilitated physical condition, his severe scoliosis and underlying chronic lung disease. However, risks of conventional aortic valve replacement are certainly not prohibitive.  He claims to have continued to abstain from any alcohol use since hospital discharge. He stopped taking Lasix and his shortness of breath is gradually progressing and he is now again developed lower extremity edema.   Plan:  The patient was counseled at length regarding treatment alternatives for management of severe symptomatic aortic stenosis. The high-risk nature of surgical intervention has been discussed in detail. Long-term prognosis with medical therapy was discussed. Alternative approaches such as  conventional aortic valve replacement, transcatheter aortic valve replacement, and palliative medical therapy were compared and contrasted at length. This discussion was placed in the context of the patient's own specific clinical presentation and past medical history.  Based upon his CT angiogram performed at the time of this recent hospitalization, he would not be candidate for transcatheter aortic valve replacement from a transfemoral approach. Overall I feel that conventional aortic valve replacement via median sternotomy would probably carry with it acceptably low risk.  However, there is no question that the patient would require at least temporary placement in a skilled nursing facility or rehabilitation facility during his convalescence.  All of their questions been addressed.  The patient seems to be interested in proceeding with surgery but he insists on waiting until early October for personal reasons. I've made it clear to him that there is a very significant possibility that he will continue to deteriorate in the coming weeks in either wind up back in the hospital with congestive heart failure or perhaps even die unexpectedly.  I've offered him the opportunity to come back and see me again in 6 weeks. He remains clinically stable and desires to proceed with elective surgery at that time we will make appropriate arrangements. If his condition deteriorate substantially he may no longer be candidate for surgical intervention.     Salvatore Decent. Cornelius Moras, MD 11/20/2011 10:42 AM

## 2011-11-20 NOTE — Patient Instructions (Signed)
Called EMS or go directly to the emergency room for acute exacerbations of shortness of breath and/or chest pain

## 2012-01-01 ENCOUNTER — Ambulatory Visit (INDEPENDENT_AMBULATORY_CARE_PROVIDER_SITE_OTHER): Payer: Medicare Other | Admitting: Thoracic Surgery (Cardiothoracic Vascular Surgery)

## 2012-01-01 ENCOUNTER — Encounter: Payer: Self-pay | Admitting: Thoracic Surgery (Cardiothoracic Vascular Surgery)

## 2012-01-01 VITALS — BP 124/68 | HR 84 | Resp 20 | Ht 72.0 in | Wt 165.0 lb

## 2012-01-01 DIAGNOSIS — I359 Nonrheumatic aortic valve disorder, unspecified: Secondary | ICD-10-CM

## 2012-01-01 NOTE — Patient Instructions (Signed)
Call to schedule an appointment with your primary cardiologist as soon as practical.

## 2012-01-01 NOTE — Progress Notes (Signed)
[address]            [name] [address]          [phone]     CARDIOTHORACIC SURGERY OFFICE NOTE  Referring Provider is Herby Abraham, MD PCP is Ralene Ok, MD   HPI:  Patient returns for followup of severe symptomatic aortic stenosis. I originally had the opportunity to see the patient in consultation during his hospitalization 2 months ago. I saw him more recently here in the office on 11/20/2011. At that time he seemed to be clinically stable although he continued to have problems with severe exertional shortness of breath with intermittent acute exacerbations of resting shortness of breath. He declined an opportunity to consider elective aortic valve replacement at that time. He returns to the office today discuss matters further. He states that he still has at least 3 or 4 episodes each week where he gets severe resting shortness of breath. Intermittently he otherwise has moderate to severe exertional shortness of breath. He is not having any chest pain. He has not had any syncopal episodes. He returns to the office today without any other family members present.  He denies any alcohol consumption. The remainder of his review of systems is unremarkable.  He states that he does not want to proceed with aortic valve replacement surgery.   Current Outpatient Prescriptions  Medication Sig Dispense Refill  . amLODipine (NORVASC) 10 MG tablet Take 10 mg by mouth daily.       . folic acid (FOLVITE) 1 MG tablet Take 1 tablet (1 mg total) by mouth daily.  30 tablet  0  . furosemide (LASIX) 40 MG tablet Take 40 mg by mouth once. 10 mg po every day (40 mg tablet cuts 1/4 tab daily)      . Multiple Vitamin (MULTIVITAMIN WITH MINERALS) TABS Take 1 tablet by mouth daily.  30 tablet  0  . niacinamide 100 MG tablet Take 100 mg by mouth daily.      Marland Kitchen omega-3 acid ethyl esters (LOVAZA) 1 G capsule Take 1 g by mouth.        . vitamin B-12 (CYANOCOBALAMIN) 100  MCG tablet Take 50 mcg by mouth daily.        Marland Kitchen DISCONTD: furosemide (LASIX) 40 MG tablet Take 1 tablet (40 mg total) by mouth daily.  30 tablet  0  . aspirin 81 MG chewable tablet Chew 1 tablet (81 mg total) by mouth daily.  30 tablet  0   No current facility-administered medications for this visit.   Facility-Administered Medications Ordered in Other Visits  Medication Dose Route Frequency Provider Last Rate Last Dose  . 0.9 %  sodium chloride infusion  1 mL/kg/hr Intravenous Continuous Samuel Bunting, MD          Physical Exam:   BP 124/68  Pulse 84  Resp 20  Ht 6' (1.829 m)  Wt 165 lb (74.844 kg)  BMI 22.38 kg/m2  SpO2 95%  General:  Elderly and cantankerous  Chest:   Few basilar crackles  CV:   Regular rate and rhythm with prominent systolic murmur  Incisions:  n/a  Abdomen:  Soft and nontender  Extremities:  Warm and adequate perfused  Diagnostic Tests:  n/a   Impression:  The patient has severe symptomatic aortic stenosis with chronic severe exertional shortness of breath and frequent relapsing episodes of resting  shortness of breath.  He remains reluctant to consider elective surgical intervention.    Plan:  I have again attempted with best intentions to make certain that Samuel [name] understands the nature of his underlying severe aortic stenosis, potential risks and benefits of elective aortic valve replacement, and long-term prognosis with continued medical therapy.  We spent in excess of 30 minutes in the office today. I am not confident that he fully understands the implications of his decisions, but it seems quite clear that he does not wish to proceed with surgery.  I have also made an effort to help him understand that should his clinical condition deteriorate he might not be candidate for surgical intervention in the future. All of his questions been addressed. He will call and return to see [place] as desired.   Samuel [name]. Samuel Moras, MD 01/01/2012 1:15  PM

## 2012-10-02 ENCOUNTER — Telehealth: Payer: Self-pay | Admitting: Cardiology

## 2012-10-02 NOTE — Telephone Encounter (Signed)
Spoke with pt, Aware of dr crenshaw's recommendations.  °

## 2012-10-02 NOTE — Telephone Encounter (Signed)
Spoke with pt, he had called dr Barry Dienes office to see him to discuss surgery and they told him he needed to see dr Jens Som first. The pt reports his SOB has gotten a lot worse in the last two weeks and he is thinking about having valve surgery now. He denies edema but states his SOB gets worse when he lies down. He is currently taking lasix 10 mg once daily. Was able to schedule the pt to see the pa 10-08-12. Will forward to dr Jens Som, ? Any changes in meds prior to the appt.

## 2012-10-02 NOTE — Telephone Encounter (Signed)
New problem    Pt states dr Cornelius Moras wants him to see his cardiologist due to his breathing not getting better

## 2012-10-02 NOTE — Telephone Encounter (Signed)
[name]; no change in meds until then. Samuel [name]

## 2012-10-08 ENCOUNTER — Encounter: Payer: Self-pay | Admitting: Physician Assistant

## 2012-10-08 ENCOUNTER — Ambulatory Visit (INDEPENDENT_AMBULATORY_CARE_PROVIDER_SITE_OTHER): Payer: Medicare Other | Admitting: Physician Assistant

## 2012-10-08 VITALS — BP 139/58 | HR 66 | Ht 71.0 in | Wt 181.0 lb

## 2012-10-08 DIAGNOSIS — I10 Essential (primary) hypertension: Secondary | ICD-10-CM

## 2012-10-08 DIAGNOSIS — F1010 Alcohol abuse, uncomplicated: Secondary | ICD-10-CM

## 2012-10-08 DIAGNOSIS — I359 Nonrheumatic aortic valve disorder, unspecified: Secondary | ICD-10-CM

## 2012-10-08 NOTE — Progress Notes (Signed)
[address], [address] [place], [place]  [address] Phone: [phone] Fax:  [phone]  Date:  10/08/2012   ID:  KROSBY RITCHIE, DOB [birth year], MRN [id number]  PCP:  Ralene Ok, MD  Cardiologist:  Dr. Olga Millers     History of Present Illness: Samuel [name] is a 79 y.o. male who returns for f/u on AS.  He has a hx of severe AS, HTN, ETOH abuse.  Echo in 03/2011: normal EF, mean AV gradient 57, mild AI.  He had previously refused AVR.  He was admitted 10/2011 with volume overload and symptomatic AS.  LHC demonstrated luminal irregs but no significant CAD. Echo 10/2011:  Mild LVH, EF 60-65%, Gr 1 DD, mod AS, mean 33 (this was felt to be worse than measured), MAC, PASP 32.   He was seen by Dr. Cornelius Moras in the hospital and again as an outpatient.  Last seen 12/2011.  Patient continued to refuse AVR.  He recently called in with worsening symptoms and was placed on my schedule.  He notes worsening DOE over the last 8 weeks or so.  He describes NYHA Class IIIb symptoms.  He denies CP.  No syncope. No orthopnea, PND, edema.  He has chronic dizziness.  No changes.    Lbs (7/13):  K 3.7, Cr 0.87, ALT 14, Hgb 14.7, TSH 2.355  Wt Readings from Last 3 Encounters:  10/08/12 181 lb (82.101 kg)  01/01/12 165 lb (74.844 kg)  11/20/11 171 lb (77.565 kg)     Past Medical History  Diagnosis Date  . Depression   . GERD (gastroesophageal reflux disease)   . COPD (chronic obstructive pulmonary disease)   . Aortic stenosis   . Essential hypertension, benign   . Decreased libido   . DIZZINESS, CHRONIC   . Alcohol abuse   . Shortness of breath 10/24/11    "all the time"  . Arthritis     "in the low vertebra"  . Chronic lower back pain   . Scoliosis/kyphoscoliosis 10/28/2011  . Abdominal aortic aneurysm 10/29/2011    3.7cm AAA by CT scan with unusual appearance suggestive of penetrating atherosclerotic ulcer or localized dissection    Current Outpatient Prescriptions  Medication Sig  Dispense Refill  . amLODipine (NORVASC) 10 MG tablet Take 10 mg by mouth daily.       . folic acid (FOLVITE) 1 MG tablet Take 1 tablet (1 mg total) by mouth daily.  30 tablet  0  . furosemide (LASIX) 40 MG tablet Take 40 mg by mouth once. 10 mg po every day (40 mg tablet cuts 1/4 tab daily)      . Multiple Vitamin (MULTIVITAMIN WITH MINERALS) TABS Take 1 tablet by mouth daily.  30 tablet  0  . niacinamide 100 MG tablet Take 100 mg by mouth daily.      Marland Kitchen omega-3 acid ethyl esters (LOVAZA) 1 G capsule Take 1 g by mouth.        . vitamin B-12 (CYANOCOBALAMIN) 100 MCG tablet Take 50 mcg by mouth daily.        Marland Kitchen aspirin 81 MG chewable tablet Chew 1 tablet (81 mg total) by mouth daily.  30 tablet  0   No current facility-administered medications for this visit.   Facility-Administered Medications Ordered in Other Visits  Medication Dose Route Frequency Provider Last Rate Last Dose  . 0.9 %  sodium chloride infusion  1 mL/kg/hr Intravenous Continuous Lewayne Bunting, MD  Allergies:   No Known Allergies  Social History:  The patient  reports that he quit smoking about 60 years ago. His smoking use included Cigarettes. He smoked 0.00 packs per day for 2.5 years. He has never used smokeless tobacco. He reports that he drinks about 37.8 ounces of alcohol per week. He reports that he does not use illicit drugs.   ROS:  Please see the history of present illness.   He has a cough from upper airway drainage.   All other systems reviewed and negative.   PHYSICAL EXAM: VS:  BP 139/58  Pulse 66  Ht 5\' 11"  (1.803 m)  Wt 181 lb (82.101 kg)  BMI 25.26 kg/m2 Well nourished, well developed, in no acute distress HEENT: normal Neck: no JVD at 90 Cardiac:  normal S1, diminished S2; RRR; 3/6 systolic murmur at the RUSB, 2/6 diastolic murmur along LSB Lungs:  clear to auscultation bilaterally, no wheezing, rhonchi or rales Abd: soft, nontender, no hepatomegaly Ext: no edema Skin: warm and dry Neuro:   CNs 2-12 intact, no focal abnormalities noted  EKG:  NSR, HR 66, normal axis, LVH, no change from prior tracing   ASSESSMENT AND PLAN:  1. Aortic Stenosis:  Patient reports significant symptoms of exertional dyspnea. This is worse than what he has experienced previously. He denies any chest discomfort suggestive of angina. He denies any syncope. He does not look volume overloaded on exam today. He states that he is ready to undergo aortic valve surgery. I reviewed his case with Dr. Jens Som today. Plan echocardiogram to reassess his aortic valve and make sure his LV function has not deteriorated. Depending upon the results of his echocardiogram, consider referral back to Dr. Cornelius Moras for aortic valve replacement versus Dr. Excell Seltzer for TAVR. 2. Hypertension:  Controlled.  Continue current therapy.  3. ETOH Abuse:  He tells me he has cut back significantly on his drinking and does not drink daily anymore. 4. Disposition:  Await echo results.  F/u with Dr. Olga Millers in 2 mos.   Signed, Tereso Newcomer, PA-C  10/08/2012 4:48 PM

## 2012-10-08 NOTE — Patient Instructions (Addendum)
PLEASE SCHEDULE ECHO W/O CONTRAST  PLEASE FOLLOW UP WITH DR. CRENSHAW IN ABOUT 2 MONTHS

## 2012-10-15 ENCOUNTER — Ambulatory Visit (HOSPITAL_COMMUNITY): Payer: Medicare Other | Attending: Physician Assistant | Admitting: Radiology

## 2012-10-15 DIAGNOSIS — J4489 Other specified chronic obstructive pulmonary disease: Secondary | ICD-10-CM | POA: Insufficient documentation

## 2012-10-15 DIAGNOSIS — J449 Chronic obstructive pulmonary disease, unspecified: Secondary | ICD-10-CM | POA: Insufficient documentation

## 2012-10-15 DIAGNOSIS — I079 Rheumatic tricuspid valve disease, unspecified: Secondary | ICD-10-CM | POA: Insufficient documentation

## 2012-10-15 DIAGNOSIS — F1010 Alcohol abuse, uncomplicated: Secondary | ICD-10-CM | POA: Insufficient documentation

## 2012-10-15 DIAGNOSIS — Z87891 Personal history of nicotine dependence: Secondary | ICD-10-CM | POA: Insufficient documentation

## 2012-10-15 DIAGNOSIS — I10 Essential (primary) hypertension: Secondary | ICD-10-CM | POA: Insufficient documentation

## 2012-10-15 DIAGNOSIS — I359 Nonrheumatic aortic valve disorder, unspecified: Secondary | ICD-10-CM

## 2012-10-15 NOTE — Progress Notes (Signed)
Echocardiogram performed.  

## 2012-10-16 ENCOUNTER — Encounter: Payer: Self-pay | Admitting: Physician Assistant

## 2012-10-21 ENCOUNTER — Telehealth: Payer: Self-pay | Admitting: *Deleted

## 2012-10-21 DIAGNOSIS — I350 Nonrheumatic aortic (valve) stenosis: Secondary | ICD-10-CM

## 2012-10-21 NOTE — Telephone Encounter (Signed)
Message copied by Tarri Fuller on Mon Oct 21, 2012  2:16 PM ------      Message from: [place], [place] T      Created: Fri Oct 18, 2012  2:22 PM       Reviewed echo with Dr. Olga Millers.      Aortic stenosis is worse.      He needs referral back to [facility] as soon as possible for evaluation for possible surgery.      He has seen Dr. Cornelius Moras in the past.      Tereso Newcomer, PA-C        10/18/2012 2:22 PM ------

## 2012-10-21 NOTE — Telephone Encounter (Signed)
lmptcb on both his home and cell, also lmom on daughter's [phone] as well for ptcb go over echo results and will need to be referred to [facility]  Dr. Cornelius Moras due to Aortic Stenosis worse per echo and Scott W. PA.

## 2012-10-22 NOTE — Telephone Encounter (Signed)
Spoke with pt, aware of echo results. Referral to dr Cornelius Moras made.

## 2012-10-22 NOTE — Telephone Encounter (Signed)
Follow Up   Pt returning call in regards to ECHO results.

## 2012-10-22 NOTE — Telephone Encounter (Signed)
Left message for pt to call.

## 2012-10-22 NOTE — Telephone Encounter (Signed)
Follow Up ° ° °Pt returning call from earlier. Please call back. °

## 2012-10-24 ENCOUNTER — Other Ambulatory Visit: Payer: Self-pay | Admitting: *Deleted

## 2012-10-24 ENCOUNTER — Ambulatory Visit (INDEPENDENT_AMBULATORY_CARE_PROVIDER_SITE_OTHER): Payer: Medicare Other | Admitting: Thoracic Surgery (Cardiothoracic Vascular Surgery)

## 2012-10-24 ENCOUNTER — Encounter: Payer: Self-pay | Admitting: Thoracic Surgery (Cardiothoracic Vascular Surgery)

## 2012-10-24 VITALS — BP 121/62 | HR 80 | Resp 20 | Ht 71.0 in | Wt 181.0 lb

## 2012-10-24 DIAGNOSIS — I350 Nonrheumatic aortic (valve) stenosis: Secondary | ICD-10-CM

## 2012-10-24 DIAGNOSIS — I359 Nonrheumatic aortic valve disorder, unspecified: Secondary | ICD-10-CM

## 2012-10-24 NOTE — Progress Notes (Signed)
[address]       [name] [address]             [phone]     CARDIOTHORACIC SURGERY CONSULTATION REPORT  Referring Provider is Jens Som, Madolyn Frieze, MD PCP is Ralene Ok, MD  Chief Complaint  Patient presents with  . Aortic Stenosis    Discuss surgery for severe aortic stenosis, 2D ECHO 10/15/2012    HPI:  Patient is an 79 year old male who has been followed for several years by Dr. Jens Som with known history of severe symptomatic aortic stenosis and alcoholism.  The patient has historically remained adamantly opposed to any considerations for elective aortic valve replacement.  I first saw him in consultation nearly 1 year ago when he was hospitalized for congestive heart failure, and I saw him more recently on 01/01/2012.  At that time he continued to resist any thoughts regarding possible elective aortic valve replacement.  The patient reports that over the last year he has gone downhill considerably. He gets short of breath with minimal physical activity and occasionally at rest. He reports that he is feeling weaker and weaker. He has frequent dizzy spells without syncope. He reports only occasional mild, transient chest discomfort. He denies PND, orthopnea, or lower extremity edema. As his breathing has deteriorated over the past few months he has reconsidered the possibility of considering elective surgical intervention. He was seen recently by Tereso Newcomer and a followup echocardiogram was performed confirming further progression in the severity of aortic stenosis.  Peak velocity across the aortic valve now measures 5.6 m/s corresponding to peak and mean transvalvular gradients estimated 127 and 78 mm mercury respectively. Left ventricular systolic function remains reasonably well preserved with ejection fraction estimated 55-60%. Diagnostic cardiac catheterization performed July of 2013 was notable for the absence of significant coronary artery disease. Right  heart catheterization was not performed at that time.  The patient is widowed and divorced and lives alone. His daughter lives locally in [place] and is present for his visit today.  He has a sister who lives in [place]. He reports that he remains functionally independent although he has clearly gone downhill. He admits that he has had a problem with alcohol in the past.  He states that he still drinks some alcohol on a regular basis, but both he and his daughter report that his alcohol consumption has been less recently.   Past Medical History  Diagnosis Date  . Depression   . GERD (gastroesophageal reflux disease)   . COPD (chronic obstructive pulmonary disease)   . Aortic stenosis     Echo 7/14:  Severe LVH, EF 55-60%, severe AS, mean gradient 78 mmHg, MAC, mild LAE  . Essential hypertension, benign   . Decreased libido   . DIZZINESS, CHRONIC   . Alcohol abuse   . Shortness of breath 10/24/11    "all the time"  . Arthritis     "in the low vertebra"  . Chronic lower back pain   . Scoliosis/kyphoscoliosis 10/28/2011  . Abdominal aortic aneurysm 10/29/2011    3.7cm AAA by CT scan with unusual appearance suggestive of penetrating atherosclerotic ulcer or localized dissection    Past Surgical History  Procedure Laterality Date  . No past surgeries      Family History  Problem Relation Age of Onset  . Other      no known medical hx of heart disease    History   Social History  . Marital  Status: Widowed    Spouse Name: N/A    Number of Children: N/A  . Years of Education: N/A   Occupational History  . Not on file.   Social History Main Topics  . Smoking status: Former Smoker -- 2.5 years    Types: Cigarettes    Quit date: 07/02/1952  . Smokeless tobacco: Never Used  . Alcohol Use: 37.8 oz/week    42 Glasses of wine, 21 Cans of beer per week     Comment: 10/24/11 "drink q day; quite a bit"; Started 1974 (mostly hard liquor)  . Drug Use: No  . Sexually Active: No    Other Topics Concern  . Not on file   Social History Narrative   Retired worked for [employer] in [employer]   Divorced: 1st and 2nd died and 3rd was separated   Never Smoked   Alcohol use -yes   Lives alone    Current Outpatient Prescriptions  Medication Sig Dispense Refill  . ALPRAZolam (XANAX) 0.5 MG tablet Take 0.5 mg by mouth 3 (three) times daily as needed.       Marland Kitchen amLODipine (NORVASC) 10 MG tablet Take 10 mg by mouth daily.       . folic acid (FOLVITE) 1 MG tablet Take 1 tablet (1 mg total) by mouth daily.  30 tablet  0  . furosemide (LASIX) 40 MG tablet Take 40 mg by mouth once. 10 mg po every day (40 mg tablet cuts 1/4 tab daily)      . Multiple Vitamin (MULTIVITAMIN WITH MINERALS) TABS Take 1 tablet by mouth daily.  30 tablet  0  . niacinamide 100 MG tablet Take 100 mg by mouth daily.      Marland Kitchen omega-3 acid ethyl esters (LOVAZA) 1 G capsule Take 1 g by mouth.        . vitamin B-12 (CYANOCOBALAMIN) 100 MCG tablet Take 50 mcg by mouth daily.         No current facility-administered medications for this visit.   Facility-Administered Medications Ordered in Other Visits  Medication Dose Route Frequency Provider Last Rate Last Dose  . 0.9 %  sodium chloride infusion  1 mL/kg/hr Intravenous Continuous Lewayne Bunting, MD        No Known Allergies    Review of Systems:   General:  decreased appetite, decreased energy, no weight gain, + weight loss, no fever  Cardiac:  no chest pain with exertion, no chest pain at rest, + SOB with minimal exertion, + occasional resting SOB, no PND, no orthopnea, no palpitations, no arrhythmia, no atrial fibrillation, no LE edema, + dizzy spells, no syncope  Respiratory:  + shortness of breath, no home oxygen, no productive cough, no dry cough, no bronchitis, no wheezing, no hemoptysis, no asthma, no pain with inspiration or cough, no sleep apnea, no CPAP at night  GI:   no difficulty swallowing, no reflux, no frequent heartburn,  no hiatal hernia, no abdominal pain, no constipation, no diarrhea, no hematochezia, no hematemesis, no melena  GU:   no dysuria,  no frequency, no urinary tract infection, no hematuria, no enlarged prostate, no kidney stones, no kidney disease  Vascular:  no pain suggestive of claudication, no pain in feet, no leg cramps, no varicose veins, no DVT, no non-healing foot ulcer  Neuro:   no stroke, no TIA's, no seizures, no headaches, no temporary blindness one eye,  no slurred speech, no peripheral neuropathy, no chronic pain, + chronic instability of gait,  no memory/cognitive dysfunction  Musculoskeletal: + arthritis, no joint swelling, no myalgias, + difficulty walking - uses a walker for balance, limited mobility   Skin:   no rash, no itching, no skin infections, no pressure sores or ulcerations  Psych:   no anxiety, no depression, no nervousness, no unusual recent stress  Eyes:   no blurry vision, no floaters, no recent vision changes, does not wears glasses or contacts  ENT:   no hearing loss, edentulous with full dentures  Hematologic:  + easy bruising, no abnormal bleeding, no clotting disorder, no frequent epistaxis  Endocrine:  no diabetes, does not check CBG's at home     Physical Exam:   BP 121/62  Pulse 80  Resp 20  Ht 5\' 11"  (1.803 m)  Wt 181 lb (82.101 kg)  BMI 25.26 kg/m2  SpO2 96%  General:  Elderly, fraill-appearing  HEENT:  Unremarkable   Neck:   no JVD, no bruits, no adenopathy   Chest:   clear to auscultation, symmetrical breath sounds, no wheezes, no rhonchi   CV:   RRR, harsh grade V/VI crescendo/decrescendo systolic murmur   Abdomen:  soft, non-tender, no masses   Extremities:  warm, well-perfused, pulses diminished, no LE edema  Rectal/GU  Deferred  Neuro:   Grossly non-focal and symmetrical throughout  Skin:   Clean and dry, no rashes, no breakdown   Diagnostic Tests:  Transthoracic Echocardiography  Patient: Jalin, Alicea MR #: [id number] Study Date:  10/15/2012 Gender: M Age: 54 Height: 180.3cm Weight: 82.1kg BSA: 2.8m^2 Pt. Status: Room:  ATTENDING Delfin Edis, Scott REFERRING Alben Spittle, Scott SONOGRAPHER Aida Raider, RDCS PERFORMING [facility], [facility] cc:  ------------------------------------------------------------ LV EF: 55% - 60%  ------------------------------------------------------------ Indications: 424.1 Aortic valve disorders.  ------------------------------------------------------------ History: PMH: Acquired from the patient and from the patient's chart. PMH: AAA. Scoliosis/Kyphoscoliosis. COPD. Chronic Dizziness. Murmur. Dyspnea. Risk factors: History Alcohol Abuse. Former tobacco use. Hypertension.  ------------------------------------------------------------ Study Conclusions  - Left ventricle: The cavity size was mildly dilated. Wall thickness was increased in a pattern of severe LVH. Systolic function was normal. The estimated ejection fraction was in the range of 55% to 60%. - Aortic valve: There was severe stenosis. Trivial regurgitation. Mean gradient: 78mm Hg (S). Peak gradient: Hg (S). - Mitral valve: Calcified annulus. - Left atrium: The atrium was mildly dilated. - Atrial septum: No defect or patent foramen ovale was identified. Transthoracic echocardiography. M-mode, complete 2D, spectral Doppler, and color Doppler. Height: Height: 180.3cm. Height: 71in. Weight: Weight: 82.1kg. Weight: 180.6lb. Body mass index: BMI: 25.2kg/m^2. Body surface area: BSA: 2.64m^2. Blood pressure: 139/58. Patient status: Outpatient. Location: [facility]  ------------------------------------------------------------  ------------------------------------------------------------ Left ventricle: The cavity size was mildly dilated. Wall thickness was increased in a pattern of severe LVH. Systolic function was normal. The estimated ejection fraction was in the range of 55% to  60%.  ------------------------------------------------------------ Aortic valve: Severely calcified leaflets. Doppler: There was severe stenosis. Trivial regurgitation. VTI ratio of LVOT to aortic valve: 0.18. Valve area: 0.74cm^2(VTI). Indexed valve area: 0.36cm^2/m^2 (VTI). Peak velocity ratio of LVOT to aortic valve: 0.13. Valve area: 0.53cm^2 (Vmax). Indexed valve area: 0.26cm^2/m^2 (Vmax). Mean gradient: 78mm Hg (S). Peak gradient: Hg (S).  ------------------------------------------------------------ Mitral valve: Calcified annulus. Doppler: Trivial regurgitation. Peak gradient: 7mm Hg (D).  ------------------------------------------------------------ Left atrium: The atrium was mildly dilated.  ------------------------------------------------------------ Atrial septum: No defect or patent foramen ovale was identified.  ------------------------------------------------------------ Right ventricle: The cavity size was normal. Wall thickness was normal.  Systolic function was normal.  ------------------------------------------------------------ Pulmonic valve: Doppler: Trivial regurgitation.  ------------------------------------------------------------ Tricuspid valve: Doppler: Mild regurgitation.  ------------------------------------------------------------ Right atrium: The atrium was normal in size.  ------------------------------------------------------------ Pericardium: The pericardium was normal in appearance.  ------------------------------------------------------------  2D measurements Normal Doppler measurements Normal Left ventricle Main pulmonary LVID ED, 35.3 mm 43-52 artery chord, Pressure, 20 mm Hg =30 PLAX S LVID ES, 28.2 mm 23-38 Left ventricle chord, Ea, lat 6.32 cm/s ------ PLAX ann, tiss FS, chord, 20 % >29 DP PLAX E/Ea, lat 20.8 ------ LVPW, ED 15.1 mm ------ ann, tiss 9 IVS/LVPW 1.1 <1.3 DP ratio, ED Ea, med 6.58 cm/s  ------ Ventricular septum ann, tiss IVS, ED 16.6 mm ------ DP LVOT E/Ea, med 20.0 ------ Diam, S 23 mm ------ ann, tiss 6 Area 4.15 cm^2 ------ DP Diam 23 mm ------ LVOT Aorta Peak vel, 71.9 cm/s ------ Root diam, 35 mm ------ S ED VTI, S 24.6 cm ------ Left atrium Stroke vol 102. ml ------ AP dim 44 mm ------ 2 AP dim 2.18 cm/m^2 <2.2 Stroke 50.6 ml/m^2 ------ index index Aortic valve Peak vel, 564 cm/s ------ S Mean vel, 404 cm/s ------ S VTI, S 139 cm ------ Mean 78 mm Hg ------ gradient, S Peak 127 mm Hg ------ gradient, S VTI ratio 0.18 ------ LVOT/AV Area, VTI 0.74 cm^2 ------ Area index 0.36 cm^2/m ------ (VTI) ^2 Peak vel 0.13 ------ ratio, LVOT/AV Area, Vmax 0.53 cm^2 ------ Area index 0.26 cm^2/m ------ (Vmax) ^2 Regurg PHT 309 ms ------ Mitral valve Peak E vel 132 cm/s ------ Peak A vel 96.7 cm/s ------ Decelerati 243 ms 150-23 on time 0 Peak 7 mm Hg ------ gradient, D Peak E/A 1.4 ------ ratio Tricuspid valve Regurg 195 cm/s ------ peak vel Peak RV-RA 15 mm Hg ------ gradient, S Systemic veins Estimated 5 mm Hg ------ CVP Right ventricle Pressure, 20 mm Hg <30 S Sa vel, 14.7 cm/s ------ lat ann, tiss DP  ------------------------------------------------------------ Prepared and Electronically Authenticated by  Charlton Haws 2014-07-15T18:26:[id number]    Cardiac Catheterization Procedure Note  Name: OAK DOREY  MRN: [id number]  DOB: 06/28/1926  Procedure: Right Heart Cath, Placement of catheters without left heart cath, Selective Coronary Angiography, aortic root angiography  Indication: Aortic stenosis  Procedural Details: The right groin was prepped, draped, and anesthetized with 1% lidocaine. Using the modified Seldinger technique a 6 French sheath was placed in the right femoral artery and a 7 French sheath was placed in the right femoral vein. A Swan-Ganz catheter was used for the right heart catheterization. We were unable  to get up beyond the RA due to marked scoliosis. Only RA pressure was measured. We did a hand injection. Standard Judkins catheters were used for selective coronary angiography and aortic root, but was difficult due to the tortuosity. . There were no immediate procedural complications. The patient was transferred to the post catheterization recovery area for further monitoring.  Procedural Findings:  Hemodynamics  RA 9 (?) if RA  RV not done  PA not done  PCWP not done  LV not done  AO 146/63 (97)  Oxygen saturations:  PA not done  AO 94% on O2  Cardiac Output (Fick) not done  Cardiac Index (Fick) not done  Coronary angiography:  Coronary dominance: right  Aortic root demonstrated a high calcified valve with minimal mobility. There was at least moderate AR, graded at 2-3 plus.  Left mainstem: short no obstruction  Left anterior descending (LAD): Mild irregularity without obstruction  Left circumflex (  LCx): Minor luminal irregularity without obstruction.  Right coronary artery (RCA): Large caliber vessel with mild proximal irregularity, but no critical disease. Large PDA and PLA.  Left ventriculography: Left ventricular systolic function is normal, LVEF is estimated at 55-65%, there is no significant mitral regurgitation  Final Conclusions:  1. Severe AS by multiple echoes, with AI by angio  2. No significant coronary obstruction  3. Unable to get to RA from the femoral approach due to severe scoliosis  Recommendations:  1. Spoke with Dr. Elpidio Galea surgical consult  2. RHC from IJ with doppler if needed  3. ? AVR versus TAVR consideration. The latter would likely require transapical or more central approach. See catheter orientation on fluoro.  [name]  10/27/2011, 10:32 AM     CT ANGIOGRAPHY CHEST, ABDOMEN AND PELVIS  Technique: Multidetector CT imaging through the chest, abdomen and  pelvis was performed using the standard protocol during bolus  administration  of intravenous contrast. Multiplanar reconstructed  images including MIPs were obtained and reviewed to evaluate the  vascular anatomy.  Contrast: OMNIPAQUE IOHEXOL 350 MG/ML SOLN  Comparison: Chest radiograph 10/29/2011  CTA CHEST  Findings: The patient has severe S-shaped scoliosis of the  thoracic spine. There is no evidence for a thoracic aortic  dissection or intramural hematoma. The thoracic aorta is very  tortuous and related to the severe scoliosis. There are extensive  calcifications involving the aortic valve. Small calcifications  involving the left anterior descending coronary artery. No  significant pericardial or pleural fluid. The ascending thoracic  aorta measures up to 3.5 cm. The great vessels are patent.  Limited evaluation of the right common carotid artery origin due to  artifact from the adjacent vein and contrast. Descending thoracic  aorta roughly measures 2.8 cm. No evidence for a large pulmonary  embolism.  There is material within the mainstem bronchus which most likely  represents mucus. There is narrowing and compression of the right  lower lobe bronchus which may be associated with the scoliosis and  there is volume loss within the right lower lobe. Densities in the  right lower lobe could represent atelectasis or scarring.  Otherwise, the lungs are clear.  Review of the MIP images confirms the above findings.  IMPRESSION:  The thoracic aorta is very tortuous and associated with the severe  thoracic scoliosis. There is no evidence for aortic dissection or  significant dilatation. Great vessels are patent.  The aortic valve is heavily calcified.  There is marked volume loss in the right lower lobe with focal  scarring or consolidation. The right lower lobe bronchus appears  to be compressed and probably related to the scoliosis.  CTA ABDOMEN AND PELVIS  Findings: Vascular findings: There is plaque and narrowing at the  origin of the celiac trunk.  Left gastric artery may be originating  from the aorta or near the origin of celiac trunk. The origin of  the superior mesenteric artery is heavily calcified. The distal  SMA is patent. Calcifications at the origin of the renal arteries  which are patent bilaterally. There is an eccentric aneurysm  involving the right posterior aspect of the infrarenal abdominal  aorta. The aortic aneurysm measures 3.7 x 3.4 cm. The posterior  aortic lumen is very irregular at the level of the aneurysm. The  aneurysm contains mural thrombus. The configuration of this  aneurysm is unusual and raises concern for a pseudoaneurysm,  possibly secondary to a penetrating ulcer. The inferior mesenteric  artery is patent.  There is close to 50% luminal narrowing of the  proximal left common iliac artery. There is edema in the right  groin related to previous catheterization. External iliac arteries  and femoral arteries are widely patent. Minimal plaque involving  the common femoral arteries.  Nonvascular structures: No gross abnormality to the liver,  gallbladder, spleen, kidneys or adrenal tissue. Evidence for a  cyst involving the right kidney upper pole. No gross abnormality  to the pancreas. No significant free fluid or lymphadenopathy.  Urinary bladder is decompressed. No gross abnormality to the  prostate. There is extensive colonic diverticulosis.  Review of the MIP images confirms the above findings.  IMPRESSION:  Eccentric aneurysm involving infrarenal abdominal aorta, measures  up to 3.7 cm. 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  Close to 50% narrowing of the proximal left common iliac artery.  Otherwise, there is no significant stenosis involving the iliac  arteries.  Plaque and calcification at the origin of the visceral arteries.  Extensive colonic diverticulosis.  These results were called by telephone on 10/29/2011 at 3:15 p.m.  to Dr. Cornelius Moras, who verbally  acknowledged these results.  Original Report Authenticated By: Richarda Overlie, M.D.    STS Risk Calculator  Procedure    AVR  Risk of Mortality   3.5% Morbidity or Mortality  19.9% Prolonged LOS   9.0% Short LOS    27.2% Permanent Stroke   1.5% Prolonged Vent Support  12.2% DSW Infection    0.4% Renal Failure    5.6% Reoperation    8.3%   Impression:  Patient has severe symptomatic aortic stenosis with reasonably well preserved left ventricular systolic function.  Previous catheterization performed 1 year ago was notable for the absence of significant coronary artery disease. CT angiogram of the chest abdomen and pelvis was notable for the absence of significant calcification or other significant disease afflicting the thoracic aorta, but the patient did have a small (3.7 cm) infrarenal abdominal aortic aneurysm.  Based upon the patient's age and associated other comorbid medical conditions the patient's predicted risk of mortality would be moderately elevated. However, the patient has struggled with alcoholism for many years and physically appears to have declined considerably over the past year, presumably due to to his aortic stenosis.  I'm concerned that his risk of significant morbidity may be considerably higher than predicted.   Plan:  The patient was counseled at length regarding treatment alternatives for management of severe symptomatic aortic stenosis. Alternative approaches such as conventional aortic valve replacement, transcatheter aortic valve replacement, and palliative medical therapy were compared and contrasted at length.  The risks associated with conventional surgical aortic valve replacement were been discussed in detail, as were expectations for post-operative convalescence. Long-term prognosis with medical therapy was discussed. This discussion was placed in the context of the patient's own specific clinical presentation and past medical history.  The patient seems to  have a clear understanding at this point and desires to proceed with further diagnostic workup and possible elective surgery. We will send him for formal physical therapy consultation to assess his current mobility limitations and frailty. We will ask Dr. Excell Seltzer to evaluate the patient and consider repeat catheterization.  We will plan for the patient return for followup in 3 weeks.    Salvatore Decent. Cornelius Moras, MD 10/24/2012 1:11 PM

## 2012-11-01 ENCOUNTER — Ambulatory Visit: Payer: Medicare Other | Admitting: Physical Therapy

## 2012-11-06 ENCOUNTER — Ambulatory Visit: Payer: Medicare Other | Attending: Thoracic Surgery (Cardiothoracic Vascular Surgery) | Admitting: Physical Therapy

## 2012-11-06 DIAGNOSIS — IMO0001 Reserved for inherently not codable concepts without codable children: Secondary | ICD-10-CM | POA: Insufficient documentation

## 2012-11-06 DIAGNOSIS — R55 Syncope and collapse: Secondary | ICD-10-CM | POA: Insufficient documentation

## 2012-11-06 DIAGNOSIS — Z01818 Encounter for other preprocedural examination: Secondary | ICD-10-CM | POA: Insufficient documentation

## 2012-11-06 DIAGNOSIS — R5381 Other malaise: Secondary | ICD-10-CM | POA: Insufficient documentation

## 2012-11-06 DIAGNOSIS — I359 Nonrheumatic aortic valve disorder, unspecified: Secondary | ICD-10-CM | POA: Insufficient documentation

## 2012-11-08 ENCOUNTER — Encounter: Payer: Self-pay | Admitting: Cardiovascular Disease

## 2012-11-08 ENCOUNTER — Other Ambulatory Visit: Payer: Self-pay | Admitting: Cardiovascular Disease

## 2012-11-08 ENCOUNTER — Ambulatory Visit (INDEPENDENT_AMBULATORY_CARE_PROVIDER_SITE_OTHER): Payer: Medicare Other | Admitting: Cardiovascular Disease

## 2012-11-08 ENCOUNTER — Ambulatory Visit (HOSPITAL_COMMUNITY)
Admission: RE | Admit: 2012-11-08 | Discharge: 2012-11-08 | Disposition: A | Discharge status: Home / Self Care | Payer: Medicare Other | Source: Ambulatory Visit | Attending: Cardiovascular Disease | Admitting: Cardiovascular Disease

## 2012-11-08 VITALS — BP 110/42 | HR 70 | Ht 72.0 in | Wt 160.0 lb

## 2012-11-08 DIAGNOSIS — M25559 Pain in unspecified hip: Secondary | ICD-10-CM | POA: Insufficient documentation

## 2012-11-08 DIAGNOSIS — M25552 Pain in left hip: Secondary | ICD-10-CM

## 2012-11-08 NOTE — Patient Instructions (Addendum)
Your physician recommends that you schedule a follow-up appointment in: TBD  LEFT HIP XRAY AT [facility]

## 2012-11-08 NOTE — Progress Notes (Signed)
HPI:  79 year old gentleman presenting for evaluation of severe symptomatic aortic stenosis. He has a long-standing history of aortic stenosis and alcohol abuse. Dr. Jens Som has followed him for several years with severe aortic stenosis. The patient has been symptomatic, but he has declined surgical intervention or further evaluation until one year ago when he finally had a cardiac catheterization performed. This demonstrated no obstructive coronary disease. The right heart catheterization was not able to be completed from the groin because of severe scoliosis and associated tortuosity of the venous system. He has been evaluated formally by cardiac surgery. Dr. Cornelius Moras has seen him on multiple occasions, but the patient has declined surgery. Everyone has made their best efforts to impress upon Mr. Pargas the serious nature of severe symptomatic aortic stenosis, but he has remained unwilling to proceed with treatment until recently. He returned to see Dr. Cornelius Moras July 24 and at that time seemed ready to proceed with surgical treatment of his aortic stenosis. The patient presents here today to further evaluate treatment options considering his physical frailty.  He suffers from long-standing severe scoliosis. The patient ambulates with a walker. He is limited by shortness of breath with exertion. He has occasional lightheadedness. He denies chest pain or pressure. He's felt better over the past few weeks with his breathing. He denies orthopnea, PND, or leg swelling.  His primary complaint today is severe left hip pain. He complains of left hip problems ever since lying on his left side for his echocardiogram.  The patient lives alone in an apartment building. He has a dog that he cares for. He is here with his daughter today. He tells me that he would like to delay surgery. He doesn't know how he would make accommodations for his dog if he were to be hospitalized. He would be willing to come in to the hospital  for heart catheterization or a few day hospital stay, but he is currently not interested in leaving his home for more than a few days.  Outpatient Encounter Prescriptions as of 11/08/2012  Medication Sig Dispense Refill  . ALPRAZolam (XANAX) 0.5 MG tablet Take 0.5 mg by mouth 3 (three) times daily as needed.       Marland Kitchen amLODipine (NORVASC) 10 MG tablet Take 10 mg by mouth daily.       . Multiple Vitamin (MULTIVITAMIN WITH MINERALS) TABS Take 1 tablet by mouth daily.  30 tablet  0  . niacinamide 100 MG tablet Take 100 mg by mouth daily.      Marland Kitchen omega-3 acid ethyl esters (LOVAZA) 1 G capsule Take 1 g by mouth.        . vitamin B-12 (CYANOCOBALAMIN) 100 MCG tablet Take 50 mcg by mouth daily.        . [DISCONTINUED] furosemide (LASIX) 40 MG tablet Take 40 mg by mouth once. 10 mg po every day (40 mg tablet cuts 1/4 tab daily)       Facility-Administered Encounter Medications as of 11/08/2012  Medication Dose Route Frequency Provider Last Rate Last Dose  . 0.9 %  sodium chloride infusion  1 mL/kg/hr Intravenous Continuous Lewayne Bunting, MD        Review of patient's allergies indicates no known allergies.  Past Medical History  Diagnosis Date  . Depression   . GERD (gastroesophageal reflux disease)   . COPD (chronic obstructive pulmonary disease)   . Aortic stenosis     Echo 7/14:  Severe LVH, EF 55-60%, severe AS, mean gradient 78 mmHg,  MAC, mild LAE  . Essential hypertension, benign   . Decreased libido   . DIZZINESS, CHRONIC   . Alcohol abuse   . Shortness of breath 10/24/11    "all the time"  . Arthritis     "in the low vertebra"  . Chronic lower back pain   . Scoliosis/kyphoscoliosis 10/28/2011  . Abdominal aortic aneurysm 10/29/2011    3.7cm AAA by CT scan with unusual appearance suggestive of penetrating atherosclerotic ulcer or localized dissection    Past Surgical History  Procedure Laterality Date  . No past surgeries      History   Social History  . Marital Status:  Widowed    Spouse Name: N/A    Number of Children: N/A  . Years of Education: N/A   Occupational History  . Not on file.   Social History Main Topics  . Smoking status: Former Smoker -- 2.5 years    Types: Cigarettes    Quit date: 07/02/1952  . Smokeless tobacco: Never Used  . Alcohol Use: 37.8 oz/week    42 Glasses of wine, 21 Cans of beer per week     Comment: 10/24/11 "drink q day; quite a bit"; Started 1974 (mostly hard liquor)  . Drug Use: No  . Sexually Active: No   Other Topics Concern  . Not on file   Social History Narrative   Retired worked for [employer] in [employer]   Divorced: 1st and 2nd died and 3rd was separated   Never Smoked   Alcohol use -yes   Lives alone    Family History  Problem Relation Age of Onset  . Other      no known medical hx of heart disease   ROS: General: no fevers/chills/night sweats, positive for decreased appetite and decreased energy. Positive for weight loss. Eyes: no blurry vision, diplopia, or amaurosis ENT: no sore throat or hearing loss Resp: no cough, wheezing, or hemoptysis, positive for shortness of breath CV: no edema or palpitations, positive for dizziness GI: no abdominal pain, nausea, vomiting, diarrhea, or constipation GU: no dysuria, frequency, or hematuria Skin: no rash Neuro: no headache, numbness, tingling, or weakness of extremities Musculoskeletal: Positive for back and hip pain, positive for limited mobility and gait unsteadiness Heme: no bleeding, DVT, or easy bruising Endo: no polydipsia or polyuria  BP 110/42  Pulse 70  Ht 6' (1.829 m)  Wt 72.576 kg (160 lb)  BMI 21.7 kg/m2  SpO2 96%  PHYSICAL EXAM: Pt is alert and oriented, WD, WN, in no distress. The patient is elderly. He has severe scoliosis and appears physically frail. HEENT: normal Neck: JVP normal. Carotid upstrokes delayed with bilateral bruits No thyromegaly. Lungs: equal expansion, clear bilaterally CV: Apex is discrete and  nondisplaced, RRR with grade 4/6 harsh systolic murmur at the right upper sternal border with absent A2 Abd: soft, NT, +BS, no bruit, no hepatosplenomegaly Back: no CVA tenderness, severe scoliosis Ext: no C/C/E        DP/PT pulses intact and = Skin: warm and dry without rash Neuro: CNII-XII intact             Strength intact = bilaterally  Cardiac Cath 10/24/2011: Procedural Findings:  Hemodynamics  RA 9 (?) if RA  RV not done  PA not done  PCWP not done  LV not done  AO 146/63 (97)  Oxygen saturations:  PA not done  AO 94% on O2  Cardiac Output (Fick) not done  Cardiac Index (Fick) not  done  Coronary angiography:  Coronary dominance: right  Aortic root demonstrated a high calcified valve with minimal mobility. There was at least moderate AR, graded at 2-3 plus.  Left mainstem: short no obstruction  Left anterior descending (LAD): Mild irregularity without obstruction  Left circumflex (LCx): Minor luminal irregularity without obstruction.  Right coronary artery (RCA): Large caliber vessel with mild proximal irregularity, but no critical disease. Large PDA and PLA.  Left ventriculography: Left ventricular systolic function is normal, LVEF is estimated at 55-65%, there is no significant mitral regurgitation  Final Conclusions:  1. Severe AS by multiple echoes, with AI by angio  2. No significant coronary obstruction  3. Unable to get to RA from the femoral approach due to severe scoliosis  Recommendations:  1. Spoke with Dr. Elpidio Galea surgical consult  2. RHC from IJ with doppler if needed  3. ? AVR versus TAVR consideration. The latter would likely require transapical or more central approach. See catheter orientation on fluoro.   2D Echo 10/15/2012: Study Conclusions  - Left ventricle: The cavity size was mildly dilated. Wall thickness was increased in a pattern of severe LVH. Systolic function was normal. The estimated ejection fraction was in the range of 55%  to 60%. - Aortic valve: There was severe stenosis. Trivial regurgitation. Mean gradient: 78mm Hg (S). Peak gradient: Hg (S). - Mitral valve: Calcified annulus. - Left atrium: The atrium was mildly dilated. - Atrial septum: No defect or patent foramen ovale was identified.  CTA of the abdomen and pelvis 10/29/2011: IMPRESSION:  Eccentric aneurysm involving infrarenal abdominal aorta, measures  up to 3.7 cm. 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  Close to 50% narrowing of the proximal left common iliac artery.  Otherwise, there is no significant stenosis involving the iliac  arteries.  Plaque and calcification at the origin of the visceral arteries.  Extensive colonic diverticulosis.  CTA of the chest 10/29/2011: IMPRESSION:  The thoracic aorta is very tortuous and associated with the severe  thoracic scoliosis. There is no evidence for aortic dissection or  significant dilatation. Great vessels are patent.  The aortic valve is heavily calcified.  There is marked volume loss in the right lower lobe with focal  scarring or consolidation. The right lower lobe bronchus appears  to be compressed and probably related to the scoliosis.  ASSESSMENT AND PLAN: 79 year old gentleman with severe symptomatic aortic stenosis and preserved left ventricular function. The patient has no coronary artery disease. I have reviewed extensive dated today. I personally reviewed his CTA imaging of the chest, abdomen, and pelvis. I have also reviewed his cardiac catheterization and echo images. He has undergone formal physical therapy evaluation. His frailty index = 5 which puts him in a 'mildly frail' category. Formal testing with gait speed, timed up and go, 30 second chair stand, and 4 staged balance testing all indicate high fall risk and below normal values for age. I think he is clearly at high-risk of a difficult and prolonged recovery from surgical aortic valve  replacement. While his STS risk of expected mortality with aortic valve replacement is only 3.5%, I think it is true risk is significantly higher than this. With that said, other options are limited. He is clearly going downhill with symptoms related to severe aortic stenosis. I think transcatheter aortic valve replacement would be a reasonable alternative for him. However, after review of his CT angiography and cardiac catheterization films, I do not think he would be a  candidate for a transfemoral approach because of severe aortic tortuosity related to scoliosis. I'm not sure that his recovery from either direct aortic or transapical TAVR would be much easier for him than open surgical repair.   The other issue at present is that the patient has extremely limited insight into his disease process. He tells me again that he would like to delay surgery. In reviewing records, his inability to decide on treatment has been a repetitive pattern dating back several years. I don't think there is any question that he would require skilled nursing for rehabilitation after any intervention on his aortic valve. He is not willing to do this at the present time. He states that he would rather die than leave his dog. I tried to impress upon him that he has a very narrow window for intervention considering his progressive decline and critical stenosis of his aortic valve.  If he opts for surgery, I do not think he would require repeat heart catheterization. He had no coronary artery disease one year ago. A right heart catheterization was not completed, but he has no evidence of right heart dysfunction or pulmonary hypertension on echo.  The patient has followup scheduled with Dr. Cornelius Moras in the near future. I would be happy to see him back any time if I can help in his decision-making process.  Tonny Bollman 11/10/2012 12:30 PM

## 2012-11-10 ENCOUNTER — Encounter: Payer: Self-pay | Admitting: Cardiovascular Disease

## 2012-11-13 ENCOUNTER — Telehealth: Payer: Self-pay | Admitting: *Deleted

## 2012-11-13 NOTE — Telephone Encounter (Signed)
PT WALKED  IN WITH C/O OF SEVERE  HIP PAIN  HAD THIS IN PAST  AFTER HAVING ECHO AND  PAIN FINALLY DISAPPEARED  RECENTLY  HAD AN ECHO AGAIN   AND NOTICED HIP PAIN  AGAIN  PER PT ONLY  NOTES  THE  MORE SEVERE PAIN WHEN LYING DOWN  PER PT  SAW DR COOPER LAST  WEEK AND HE  ORDERED HIP XRAY.  XRAY  WAS OKAY  DISCUSSED WITH DR COOPER PT  NEEDS TO F/U  WITH PMD  TO ADDRESS HIP PAIN. PT AWARE  .Zack Seal

## 2012-11-14 ENCOUNTER — Other Ambulatory Visit: Payer: Self-pay | Admitting: *Deleted

## 2012-11-14 ENCOUNTER — Telehealth: Payer: Self-pay | Admitting: *Deleted

## 2012-11-14 NOTE — Telephone Encounter (Signed)
Patient came in today to talk about changing surgery dates.  He says he cannot have surgery until middle of September due to home/finacial issues.  Explained to patient that we advise he has surgery sooner than later.  He went ahead and picked 9/17 for his surgery date and said he could not have it any sooner.  Surgery is now booked for 9/17 with Dr. Cornelius Moras.  All pre-op appts are on Monday 9/15.  Patient told to call our office if any other concerns came up.  Patient understands.

## 2012-11-18 ENCOUNTER — Other Ambulatory Visit: Payer: Self-pay | Admitting: *Deleted

## 2012-11-18 ENCOUNTER — Encounter (HOSPITAL_COMMUNITY): Payer: Medicare Other

## 2012-11-18 ENCOUNTER — Ambulatory Visit: Payer: Medicare Other | Admitting: Thoracic Surgery (Cardiothoracic Vascular Surgery)

## 2012-11-18 ENCOUNTER — Ambulatory Visit (HOSPITAL_COMMUNITY): Payer: Medicare Other

## 2012-11-18 ENCOUNTER — Other Ambulatory Visit (HOSPITAL_COMMUNITY): Payer: Medicare Other

## 2012-11-18 DIAGNOSIS — I359 Nonrheumatic aortic valve disorder, unspecified: Secondary | ICD-10-CM

## 2012-12-12 ENCOUNTER — Encounter (HOSPITAL_COMMUNITY): Payer: Self-pay | Admitting: Respiratory Therapy

## 2012-12-12 ENCOUNTER — Ambulatory Visit (INDEPENDENT_AMBULATORY_CARE_PROVIDER_SITE_OTHER): Payer: Medicare Other | Admitting: Cardiology

## 2012-12-12 ENCOUNTER — Encounter: Payer: Self-pay | Admitting: Cardiology

## 2012-12-12 VITALS — BP 130/60 | HR 70 | Wt 160.0 lb

## 2012-12-12 DIAGNOSIS — I359 Nonrheumatic aortic valve disorder, unspecified: Secondary | ICD-10-CM

## 2012-12-12 DIAGNOSIS — I350 Nonrheumatic aortic (valve) stenosis: Secondary | ICD-10-CM

## 2012-12-12 DIAGNOSIS — I714 Abdominal aortic aneurysm, without rupture: Secondary | ICD-10-CM

## 2012-12-12 NOTE — Assessment & Plan Note (Signed)
Patient has known symptomatic severe aortic stenosis. I have tried for years to impress upon him the need for aortic valve replacement. He has always delayed this procedure. He is scheduled again next week but states he is too weak to proceed. He would like to now delay until November. I explained that further delays would put him at risk for death and I also explained that at some point he may not be a candidate for aortic valve replacement. He seems to understand but still states he would like to delay the surgery. He will touch base with Dr. Cornelius Moras with a final decision before next week. He has very little insight into his condition.

## 2012-12-12 NOTE — Patient Instructions (Addendum)
**Note De-identified Elden Brucato Obfuscation** Your physician recommends that you continue on your current medications as directed. Please refer to the Current Medication list given to you today.  Your physician wants you to follow-up in: 6 months. You will receive a reminder letter in the mail two months in advance. If you don't receive a letter, please call our office to schedule the follow-up appointment.  

## 2012-12-12 NOTE — Progress Notes (Signed)
HPI: FU AS. He has a hx of severe AS, HTN, ETOH abuse. LHC 7/13 demonstrated luminal irregs but no significant CAD. CTA in July 2013 showed no thoracic aortic aneurysm. There was a 3.7 cm abdominal aortic aneurysm. Echocardiogram July 2014 showed normal LV function, severe left ventricular hypertrophy, severe aortic stenosis with a mean gradient of 78 mmHg and mild left atrial enlargement. Carotid Dopplers July 2013 showed no significant stenosis. Patient has seen Dr. Excell Seltzer and Dr. Cornelius Moras recently and apparently is scheduled for aortic valve replacement next week. He is complaining of severe left hip pain. Recent x-rays were negative. He also has weakness. He has orthopnea and dyspnea on exertion. No chest pain or syncope.   Current Outpatient Prescriptions  Medication Sig Dispense Refill  . ALPRAZolam (XANAX) 0.5 MG tablet Take 0.5 mg by mouth 2 (two) times daily as needed.       Marland Kitchen amLODipine (NORVASC) 10 MG tablet Take 10 mg by mouth daily.       . furosemide (LASIX) 20 MG tablet Take 10 mg by mouth daily.      . Multiple Vitamin (MULTIVITAMIN WITH MINERALS) TABS Take 1 tablet by mouth daily.  30 tablet  0  . omega-3 acid ethyl esters (LOVAZA) 1 G capsule Take 1 g by mouth.        . traMADol (ULTRAM) 50 MG tablet Take 50 mg by mouth every 6 (six) hours as needed for pain.      . vitamin B-12 (CYANOCOBALAMIN) 100 MCG tablet Take 50 mcg by mouth daily.         No current facility-administered medications for this visit.   Facility-Administered Medications Ordered in Other Visits  Medication Dose Route Frequency Provider Last Rate Last Dose  . 0.9 %  sodium chloride infusion  1 mL/kg/hr Intravenous Continuous Lewayne Bunting, MD         Past Medical History  Diagnosis Date  . Depression   . GERD (gastroesophageal reflux disease)   . COPD (chronic obstructive pulmonary disease)   . Aortic stenosis     Echo 7/14:  Severe LVH, EF 55-60%, severe AS, mean gradient 78 mmHg, MAC, mild LAE  .  Essential hypertension, benign   . Decreased libido   . DIZZINESS, CHRONIC   . Alcohol abuse   . Shortness of breath 10/24/11    "all the time"  . Arthritis     "in the low vertebra"  . Chronic lower back pain   . Scoliosis/kyphoscoliosis 10/28/2011  . Abdominal aortic aneurysm 10/29/2011    3.7cm AAA by CT scan with unusual appearance suggestive of penetrating atherosclerotic ulcer or localized dissection    Past Surgical History  Procedure Laterality Date  . No past surgeries      History   Social History  . Marital Status: Widowed    Spouse Name: N/A    Number of Children: N/A  . Years of Education: N/A   Occupational History  . Not on file.   Social History Main Topics  . Smoking status: Former Smoker -- 2.5 years    Types: Cigarettes    Quit date: 07/02/1952  . Smokeless tobacco: Never Used  . Alcohol Use: 37.8 oz/week    42 Glasses of wine, 21 Cans of beer per week     Comment: 10/24/11 "drink q day; quite a bit"; Started 1974 (mostly hard liquor)  . Drug Use: No  . Sexual Activity: No   Other Topics Concern  . Not on  file   Social History Narrative   Retired worked for [employer] in [employer]   Divorced: 1st and 2nd died and 3rd was separated   Never Smoked   Alcohol use -yes   Lives alone    ROS: severe left hip pain but no fevers or chills, productive cough, hemoptysis, dysphasia, odynophagia, melena, hematochezia, dysuria, hematuria, rash, seizure activity, orthopnea, PND, pedal edema, claudication. Remaining systems are negative.  Physical Exam: Well-developed well-nourished in no acute distress.  Skin is warm and dry.  HEENT is normal.  Neck is supple.  Chest is clear to auscultation with normal expansion.  Cardiovascular exam is regular rate and rhythm. 3/6 systolic murmur left sternal border. S2 is not auscultated. Abdominal exam nontender or distended. No masses palpated. Extremities show 1+ ankle edema. neuro grossly  intact

## 2012-12-12 NOTE — Assessment & Plan Note (Signed)
We can perform followup ultrasounds in the future but patient would not be a candidate for surgical repair unless his aortic valve was replaced.

## 2012-12-14 NOTE — Pre-Procedure Instructions (Signed)
Samuel [name]  12/14/2012   Your procedure is scheduled on:  September 17  Report to [facility] Entrance "A" [address] at [employer] AM.  Call this number if you have problems the morning of surgery: [phone]   Remember:   Do not eat food or drink liquids after midnight.   Take these medicines the morning of surgery with A SIP OF WATER: Amlodipine, Xanax (if needed),    STOP Multiple Vitamin, Fish Oil, Vitamin B12 today  Do not take Aspirin, Aleve, Naproxen, Advil, Ibuprofen, Vitamin, Herbs, or Supplements starting today  Do not wear jewelry, make-up or nail polish.  Do not wear lotions, powders, or perfumes. You may wear deodorant.  Do not shave 48 hours prior to surgery. Men may shave face and neck.  Do not bring valuables to the hospital.  [facility] is not responsible                   for any belongings or valuables.  Contacts, dentures or bridgework may not be worn into surgery.  Leave suitcase in the car. After surgery it may be brought to your room.  For patients admitted to the hospital, checkout time is 11:00 AM the day of  discharge.   Special Instructions: Shower using CHG 2 nights before surgery and the night before surgery.  If you shower the day of surgery use CHG.  Use special wash - you have one bottle of CHG for all showers.  You should use approximately 1/3 of the bottle for each shower.   Please read over the following fact sheets that you were given: Pain Booklet, Coughing and Deep Breathing, Blood Transfusion Information, Open Heart Packet and Surgical Site Infection Prevention

## 2012-12-16 ENCOUNTER — Ambulatory Visit (INDEPENDENT_AMBULATORY_CARE_PROVIDER_SITE_OTHER): Payer: Medicare Other | Admitting: Thoracic Surgery (Cardiothoracic Vascular Surgery)

## 2012-12-16 ENCOUNTER — Encounter: Payer: Self-pay | Admitting: Thoracic Surgery (Cardiothoracic Vascular Surgery)

## 2012-12-16 ENCOUNTER — Inpatient Hospital Stay (HOSPITAL_COMMUNITY): Admission: RE | Admit: 2012-12-16 | Payer: Medicare Other | Source: Ambulatory Visit

## 2012-12-16 ENCOUNTER — Ambulatory Visit (HOSPITAL_COMMUNITY): Admission: RE | Admit: 2012-12-16 | Payer: Medicare Other | Source: Ambulatory Visit

## 2012-12-16 ENCOUNTER — Telehealth: Payer: Self-pay | Admitting: *Deleted

## 2012-12-16 ENCOUNTER — Inpatient Hospital Stay (HOSPITAL_COMMUNITY)
Admission: RE | Admit: 2012-12-16 | Discharge: 2012-12-16 | Disposition: A | Discharge status: Home / Self Care | Payer: Medicare Other | Source: Ambulatory Visit

## 2012-12-16 VITALS — BP 129/60 | HR 74 | Resp 20 | Ht 72.0 in | Wt 160.0 lb

## 2012-12-16 DIAGNOSIS — I359 Nonrheumatic aortic valve disorder, unspecified: Secondary | ICD-10-CM

## 2012-12-16 DIAGNOSIS — I350 Nonrheumatic aortic (valve) stenosis: Secondary | ICD-10-CM

## 2012-12-16 NOTE — Progress Notes (Signed)
Patient no show for PAT, PFT's and Dopplar's attempted to reach patient x 2 without success. Left message for patient to return call. Notified Allison at [employer] of same.

## 2012-12-16 NOTE — Telephone Encounter (Signed)
Patient did not show up for any of his pre-op appt's today on 9/15. His scheduled AVR surgery for 9/18 has been cancelled per Dr. Cornelius Moras.  I have tried to call patient several times and cannot get though to either him or his daughter.

## 2012-12-16 NOTE — Progress Notes (Signed)
[address]       [name] [address]             [phone]     CARDIOTHORACIC SURGERY OFFICE NOTE  Referring Provider is CRENSHAW, Madolyn Frieze, MD PCP is Ralene Ok, MD   HPI:  Patient returns for followup of severe symptomatic aortic stenosis. He was last seen here in the office 10/24/2012 at which time we discussed treatment alternatives for management of severe symptomatic aortic stenosis.  Since then he was seen by Dr. Excell Seltzer who discussed options with him further on 11/08/2012.  At that time the patient's biggest complaint was that of left hip pain which he states began after he had to lie on his left side when he underwent followup echocardiogram.  Plain film x-rays were obtained which showed mild degenerative change in both hips, right more so than the left.  Dr. Excell Seltzer did not feel that the patient needed repeat cardiac catheterization before proceeding with definitive treatment for his severe aortic stenosis. Because of severe tortuosity of the aorta and the presence of small infrarenal abdominal aortic aneurysm, the patient would probably not be candidate for transcatheter aortic valve replacement via transfemoral approach. Options might include conventional surgical aortic valve replacement or transcatheter aortic valve replacement via alternative access such as the transaortic or transapical approach.  The patient was also seen in followup last week by Dr. Jens Som. At that time the patient noted that he had become too weak to proceed with surgery. He now returns to our office to discuss matters further.  The patient is seen in the office today with his daughter present. He admits that he continues to suffer from severe pain in his left hip. He notes that he has lost his appetite and he feels very weak. He thinks he has lost some weight. He denies any shortness of breath. He's not having any chest pain nor dizzy spells. He has never had any syncope.  Current  Outpatient Prescriptions  Medication Sig Dispense Refill  . ALPRAZolam (XANAX) 0.5 MG tablet Take 0.5 mg by mouth 2 (two) times daily as needed.       Marland Kitchen amLODipine (NORVASC) 10 MG tablet Take 10 mg by mouth daily.       . furosemide (LASIX) 20 MG tablet Take 10 mg by mouth daily.      . Multiple Vitamin (MULTIVITAMIN WITH MINERALS) TABS Take 1 tablet by mouth daily.  30 tablet  0  . omega-3 acid ethyl esters (LOVAZA) 1 G capsule Take 1 g by mouth.        . traMADol (ULTRAM) 50 MG tablet Take 50 mg by mouth every 6 (six) hours as needed for pain.      . vitamin B-12 (CYANOCOBALAMIN) 100 MCG tablet Take 50 mcg by mouth daily.         No current facility-administered medications for this visit.   Facility-Administered Medications Ordered in Other Visits  Medication Dose Route Frequency Provider Last Rate Last Dose  . 0.9 %  sodium chloride infusion  1 mL/kg/hr Intravenous Continuous Lewayne Bunting, MD          Physical Exam:   BP 129/60  Pulse 74  Resp 20  Ht 6' (1.829 m)  Wt 160 lb (72.576 kg)  BMI 21.7 kg/m2  SpO2 96%  General:  Extremely debilitated and frail  Chest:   Few bibasilar in story crackles  CV:   Regular rate and rhythm with prominent  systolic murmur  Incisions:  n/a  Abdomen:  Soft nontender  Extremities:  Warm and adequately perfused. Of note the patient has fairly normal range of motion of the left hip. He does not have any tenderness on exam.   Diagnostic Tests:  *RADIOLOGY REPORT*  Clinical Data: Left hip pain for 3 weeks. Pain started after  rolling on tube the patients side.  LEFT HIP - COMPLETE 2+ VIEW  Comparison: None.  Findings: No fracture or dislocation.  The left hip joint is normally spaced and aligned. There is some  minor superior acetabular subchondral sclerosis and cystic change  and a small marginal osteophyte projects along the superior base of  the left femoral head. There are no other left-sided hip  arthropathic changes. The right  hip shows more prominent marginal  osteophytes across the base of the femoral head as well as mild  superior acetabular subchondral sclerosis.  The bones are demineralized. The SI joints and symphysis pubis are  normally spaced and aligned. The soft tissues are unremarkable  other than vascular calcifications.  IMPRESSION:  No fracture or acute finding. Right greater left hip joint  degenerative change.  Original Report Authenticated By: Amie Portland, M.D.    Impression:  Patient has critical aortic stenosis and appears to be going downhill fairly quickly. He appears severely weak and malnourished.  At this point his biggest complaint is pain in his left hip. This may be related to degenerative arthritis in the hip or in the spine itself. His strength and physical mobility appeared to have both deteriorated substantially, and overall I feel it is probably beyond the point where it would be reasonable to proceed with conventional surgical aortic valve replacement. I think it is clear that risks associated with conventional surgery would be very high with greater than 15% risk of death and perhaps more importantly greater than 50% likelihood of significant morbidity.  Transcatheter aortic valve replacement might prove to be a reasonable treatment alternative if aggressive therapy is desired, but the patient continues to express a relatively poor understanding of his overall condition and what it would mean to proceed with surgical intervention.    Plan:  We will ask Dr. Ludwig Clarks to assist with management of his hip pain and consider whether or not it might be reasonable to consider palliative steroid injection of the hip to alleviate his pain and reestablish improved mobility.  We will have the patient return to see the physical therapist's to reevaluate his current mobility and frailty and perhaps assist with exercises to improve his pain. Occupational therapy might prove useful as well. We will  obtain cardiac gated CT scan to gram of the heart to further ascertain whether or not transcatheter aortic valve replacement might prove to be a reasonable alternative, and Dr. Excell Seltzer and I will see the patient together in the multidisciplinary heart valve clinic in 4 weeks.  It may be that palliative medical therapy will be the best course of action.   Salvatore Decent. Cornelius Moras, MD 12/16/2012 5:03 PM

## 2012-12-17 ENCOUNTER — Other Ambulatory Visit: Payer: Self-pay

## 2012-12-17 DIAGNOSIS — I359 Nonrheumatic aortic valve disorder, unspecified: Secondary | ICD-10-CM

## 2012-12-18 ENCOUNTER — Other Ambulatory Visit: Payer: Self-pay | Admitting: *Deleted

## 2012-12-18 DIAGNOSIS — I359 Nonrheumatic aortic valve disorder, unspecified: Secondary | ICD-10-CM

## 2012-12-19 ENCOUNTER — Inpatient Hospital Stay (HOSPITAL_COMMUNITY)
Admission: RE | Admit: 2012-12-19 | Payer: Medicare Other | Source: Ambulatory Visit | Admitting: Thoracic Surgery (Cardiothoracic Vascular Surgery)

## 2012-12-19 ENCOUNTER — Encounter (HOSPITAL_COMMUNITY): Admission: RE | Payer: Self-pay | Source: Ambulatory Visit

## 2012-12-19 SURGERY — REPLACEMENT, AORTIC VALVE, OPEN
Anesthesia: General | Site: Chest

## 2012-12-20 ENCOUNTER — Other Ambulatory Visit (INDEPENDENT_AMBULATORY_CARE_PROVIDER_SITE_OTHER): Payer: Medicare Other

## 2012-12-20 DIAGNOSIS — I359 Nonrheumatic aortic valve disorder, unspecified: Secondary | ICD-10-CM

## 2012-12-20 LAB — BASIC METABOLIC PANEL
CO2: 31 mEq/L (ref 19–32)
Calcium: 9 mg/dL (ref 8.4–10.5)
Creatinine, Ser: 1.2 mg/dL (ref 0.4–1.5)
GFR: 62.52 mL/min (ref >60.00)
Glucose, Bld: 105 mg/dL — ABNORMAL HIGH (ref 70–99)
Sodium: 141 mEq/L (ref 135–145)

## 2012-12-25 ENCOUNTER — Ambulatory Visit (HOSPITAL_COMMUNITY)
Admission: RE | Admit: 2012-12-25 | Discharge: 2012-12-25 | Disposition: A | Discharge status: Home / Self Care | Payer: Medicare Other | Source: Ambulatory Visit | Attending: Thoracic Surgery (Cardiothoracic Vascular Surgery) | Admitting: Thoracic Surgery (Cardiothoracic Vascular Surgery)

## 2012-12-25 DIAGNOSIS — I2789 Other specified pulmonary heart diseases: Secondary | ICD-10-CM | POA: Insufficient documentation

## 2012-12-25 DIAGNOSIS — I700 Atherosclerosis of aorta: Secondary | ICD-10-CM | POA: Insufficient documentation

## 2012-12-25 DIAGNOSIS — M4120 Other idiopathic scoliosis, site unspecified: Secondary | ICD-10-CM | POA: Insufficient documentation

## 2012-12-25 DIAGNOSIS — I359 Nonrheumatic aortic valve disorder, unspecified: Secondary | ICD-10-CM

## 2012-12-25 MED ORDER — SODIUM CHLORIDE 0.9 % IV SOLN
INTRAVENOUS | Status: AC
  Filled 2012-12-25: qty 100

## 2012-12-25 MED ORDER — METOPROLOL TARTRATE 1 MG/ML IV SOLN
INTRAVENOUS | Status: AC
  Filled 2012-12-25: qty 10

## 2012-12-25 MED ORDER — IOHEXOL 350 MG/ML SOLN
80.0000 mL | Freq: Once | INTRAVENOUS | Status: AC | PRN
  Administered 2012-12-25: 80 mL via INTRAVENOUS

## 2012-12-31 ENCOUNTER — Telehealth: Payer: Self-pay | Admitting: Thoracic Surgery (Cardiothoracic Vascular Surgery)

## 2012-12-31 DIAGNOSIS — I359 Nonrheumatic aortic valve disorder, unspecified: Secondary | ICD-10-CM

## 2012-12-31 NOTE — Telephone Encounter (Signed)
I called the patient to discuss the results of his recent cardiac gated CTA of the heart.  He reported that he had no new problems since his last office visit, although he has been having some intermittent dizzy spells.  He is scheduled to be seen by his primary care physician and by physical therapy tomorrow regarding his hip pain.  I reminded him that we remain very concerned by his critical aortic stenosis and that this may be the cause of his dizzy spells.  We discussed the possibility that he could undergo TAVR via trans-aortic approach as an alternative to very high risk conventional surgical aortic valve replacement.  He expressed interest.    We will plan to see him in the office next week to see how he is getting along and to discuss possible TAVR further.  OWEN,CLARENCE H 12/31/2012 3:04 PM

## 2013-01-01 ENCOUNTER — Ambulatory Visit: Payer: Medicare Other | Attending: Thoracic Surgery (Cardiothoracic Vascular Surgery) | Admitting: Physical Therapy

## 2013-01-01 DIAGNOSIS — IMO0001 Reserved for inherently not codable concepts without codable children: Secondary | ICD-10-CM | POA: Insufficient documentation

## 2013-01-01 DIAGNOSIS — M6281 Muscle weakness (generalized): Secondary | ICD-10-CM | POA: Insufficient documentation

## 2013-01-01 DIAGNOSIS — M2560 Stiffness of unspecified joint, not elsewhere classified: Secondary | ICD-10-CM | POA: Insufficient documentation

## 2013-01-01 DIAGNOSIS — M25559 Pain in unspecified hip: Secondary | ICD-10-CM | POA: Insufficient documentation

## 2013-01-06 ENCOUNTER — Ambulatory Visit: Payer: Medicare Other | Admitting: Physical Therapy

## 2013-01-08 ENCOUNTER — Encounter: Payer: Self-pay | Admitting: Surgery

## 2013-01-08 ENCOUNTER — Other Ambulatory Visit: Payer: Self-pay | Admitting: *Deleted

## 2013-01-08 ENCOUNTER — Encounter: Payer: Self-pay | Admitting: Thoracic Surgery (Cardiothoracic Vascular Surgery)

## 2013-01-08 ENCOUNTER — Institutional Professional Consult (permissible substitution) (INDEPENDENT_AMBULATORY_CARE_PROVIDER_SITE_OTHER): Payer: Medicare Other | Admitting: Surgery

## 2013-01-08 ENCOUNTER — Ambulatory Visit (INDEPENDENT_AMBULATORY_CARE_PROVIDER_SITE_OTHER): Payer: Medicare Other | Admitting: Thoracic Surgery (Cardiothoracic Vascular Surgery)

## 2013-01-08 VITALS — BP 137/65 | HR 85 | Resp 20 | Ht 72.0 in | Wt 155.0 lb

## 2013-01-08 DIAGNOSIS — I359 Nonrheumatic aortic valve disorder, unspecified: Secondary | ICD-10-CM

## 2013-01-08 DIAGNOSIS — I350 Nonrheumatic aortic (valve) stenosis: Secondary | ICD-10-CM

## 2013-01-08 NOTE — Progress Notes (Addendum)
[address]       [name] [address]             [phone]     CARDIOTHORACIC SURGERY OFFICE NOTE  Referring Provider is Herby Abraham, MD PCP is Ralene Ok, MD   HPI:  Patient returns for followup of severe symptomatic aortic stenosis. He was last seen here in the office 12/16/2012.   Since then he underwent steroid injection of his left hip and he has been seen in followup by physical therapy. He reports that he is doing much better. He still has some pain in his hip and back but he states that he has been getting around much more easily. He has been having increasing episodes of resting shortness of breath and dizzy spells. He seemed to be transient and intermittent. For the last few days he has been feeling better. Last week he had run out of his blood pressure medications but he has now gotten his prescription filled. He denies any fevers or chills. He denies any productive cough. He still feels quite weak and his appetite is not good. The remainder of his review of systems is unchanged from previously.    Current Outpatient Prescriptions  Medication Sig Dispense Refill  . ALPRAZolam (XANAX) 0.5 MG tablet Take 0.5 mg by mouth 2 (two) times daily as needed.       Marland Kitchen amLODipine (NORVASC) 10 MG tablet Take 10 mg by mouth daily.       . furosemide (LASIX) 20 MG tablet Take 10 mg by mouth daily.      . Multiple Vitamin (MULTIVITAMIN WITH MINERALS) TABS Take 1 tablet by mouth daily.  30 tablet  0  . omega-3 acid ethyl esters (LOVAZA) 1 G capsule Take 1 g by mouth.        . traMADol (ULTRAM) 50 MG tablet Take 50 mg by mouth every 6 (six) hours as needed for pain.      . vitamin B-12 (CYANOCOBALAMIN) 100 MCG tablet Take 50 mcg by mouth daily.         No current facility-administered medications for this visit.   Facility-Administered Medications Ordered in Other Visits  Medication Dose Route Frequency Provider Last Rate Last Dose  . 0.9 %  sodium chloride  infusion  1 mL/kg/hr Intravenous Continuous Lewayne Bunting, MD          Physical Exam:   BP 137/65  Pulse 85  Resp 20  Ht 6' (1.829 m)  Wt 155 lb (70.308 kg)  BMI 21.02 kg/m2  SpO2 97%  General:  Frail appearing in no acute distress  Chest:   Scattered inspiratory crackles  CV:   Regular rate and rhythm with prominent systolic murmur  Incisions:  n/a  Abdomen:  Soft and nontender  Extremities:  Warm and adequately perfused with no lower extremity edema   Diagnostic Tests:   CARDIAC GATED CT ANGIOGRAM OF THE HEART         **ADDENDUM** CREATED: 12/25/2012 16:06:23  OVER-READ INTERPRETATION - CT CHEST  The following report is an over-read performed by radiologist Dr.  Reyes Ivan, M.D. of [facility] on 12/25/2012  16:06:23. This over-read does not include interpretation of  cardiac or coronary anatomy or pathology. The CTA interpretation  by the cardiologist is attached.  Comparison: CT chest 10/29/2011.  Findings: Mediastinal lymph nodes measure up to 9 mm in short axis.  No hilar or right axillary adenopathy. Left axilla was  not  included in the field of view. Pulmonary arteries are borderline  enlarged, indicative of pulmonary arterial hypertension.  Scarring in the right middle lobe and lingula. Probable mild  dependent atelectasis and/or scarring in the right lower lobe.  Vague ground-glass in the subpleural left upper lobe (series [address],  image 40) is likely infectious or inflammatory in etiology. 3 mm  subpleural nodule in the lingula (series 60454, image 60) is likely  stable.  Incidental imaging of the upper abdomen shows a 3.6 cm low  attenuation lesion off the upper pole right kidney, likely a cyst.  Degenerative changes in the spine which is severely scoliotic.  IMPRESSION:  No acute findings in the extracardiac portion of the chest.  **END ADDENDUM** SIGNED BY: Reyes Ivan, M.D.      Study Result    Cardiac TAVR CTA:    Indication: Aortic Stenosis TAVR Planning  Protocol: The patient was scanned on a Philips 256 scanner. A 120  kV retrospective scan was triggered in the descending thoracic  aorta at 111 HU's. Gantry rotation speed was 270 msecs and  collimation was .9 mm. No beta blockade or nitro were given. The  3D data set was reconstructed in 5% intervals of the R-R cycle.  Systolic and diastolic phases were analyzed on a dedicated work  station using MPR, MIP and VRT modes. The patient received 80 cc  of contrast.  Findings:  Aortic Valve: Tri-leaflet with heavy calcification of all 3  leaflets. Extensive calcification of the intervalvular fibrosa sub-  valvular making annular measurements a bit challenging.  Calcification of the anterior leaflet of the mitral valve as well  Aorta: Marked Scoliosis. The descending thoracic aorta is  markedly tortuous with a near 90 degree angle making transfemoral  approach particularly for a 29mm valve seem unlikely.  Ascending Aorta: 3.3 cm  STJ: 29.6 cm  Descnding Thoracic: 2.65 cm  The ascending thoracic aorta has mild calcification and lies about  1.5 cm to the right of the sternum The right internal mammary  artery courses directly over the aorta in this position.  Sinus Measurements:  NCS: 36.9 mm  LCS: 35 mm  RCS: 34.7 mm  RCA height above annulus: 17 mm  LM height above annulus: 14.2 mm  Virtual Basal Annulus Measurements:  Maximum/Minimum Diameter: Annulus is very elliptical 30.23mm/21.2  mm  Perimeter: 86.6 mm  Area: 552 mm2  Coronary Arteries: Right dominant with no anomalies. LM is  normal. LAD has non obstructive calcific disease. First diagonal  small and normal. D2 large and normal Circumflex small primarily  an AV groove branch and normal. RCA dominant and normal. No  critical CAD  Impression:  1) Calcified tri-leaflet AV with VBA area of 552 mm2. Suitable  for a soft 29mm Sapien XT valve  2) Marked Scoliosis with tortuous thoracic  aorta making  transfemoral approach with a large valve seem unlikely to be  successful Trans aortic approach may be best with care to avoid the  RIMA  3) No critical CAD with ostial origins sufficiently high to  place stented valve  Charlton Haws MD [facility]  Original Report Authenticated By: Charlton Haws, M.D.      Impression:  Patient has critical symptomatic aortic stenosis.  The acute exacerbation of pain in his left hip seems to be considerably improved since he underwent steroid injection. He also feels that some recommendations made by the physical therapist have helped him considerably as well. This may be a window of  opportunity to proceed with definitive treatment of the patient's aortic stenosis using transcatheter aortic valve replacement as an alternative to extremely high-risk conventional surgery. The patient's recent cardiac gated CT angiogram of the heart demonstrates anatomical characteristics potentially feasible for transcatheter aortic valve replacement via direct aortic approach through a right anterior mini thoracotomy.  There is considerable calcification in the aortic annulus which could increase the possibility of perivalvular leak or annular disruption. The patient does not have suitable arterial access for transfemoral approach because of the presence of a small infrarenal abdominal aortic aneurysm and extreme tortuosity of the patient's thoracic and abdominal aorta.    Plan:  I have again reviewed options with Mr. Hupp in the office today. We plan to proceed with TAVR via direct aortic approach through a right anterior mini thoractomy on 01/21/2013.  Following the decision to proceed with transcatheter aortic valve replacement, a discussion has been held regarding what types of management strategies would be attempted intraoperatively in the event of life-threatening complications, including whether or not the patient would be considered a candidate for the use of  cardiopulmonary bypass and/or conversion to open sternotomy for attempted surgical intervention.  The patient specifically requests that in the event that complications develop we would do anything possible if we thought there was a reasonable chance for satisfactory functional outcome.  The patient has been advised of a variety of complications that might develop including but not limited to risks of death, stroke, paravalvular leak, aortic dissection or other major vascular complications, aortic annulus rupture, device embolization, cardiac rupture or perforation, mitral regurgitation, acute myocardial infarction, arrhythmia, heart block or bradycardia requiring permanent pacemaker placement, congestive heart failure, respiratory failure, renal failure, pneumonia, infection, other late complications related to structural valve deterioration or migration, or other complications that might ultimately cause a temporary or permanent loss of functional independence or other long term morbidity.  The patient provides full informed consent for the procedure as described and all questions were answered.  Mr. Heyde has expressed an interest in discussing the possibility that his sister might be designated his medical power of attorney.  We have referred him to the legal counseling representatives at the hospital to assist.    Salvatore Decent. Cornelius Moras, MD 01/08/2013 5:00 PM

## 2013-01-09 ENCOUNTER — Ambulatory Visit: Payer: Medicare Other

## 2013-01-13 ENCOUNTER — Encounter: Payer: Self-pay | Admitting: Surgery

## 2013-01-13 NOTE — Progress Notes (Signed)
[address]       [name] [address]             [phone]      Cardiothoracic Surgery Consultation   PCP is Ralene Ok, MD Referring Provider is Rollene Rotunda, MD Primary Cardiologist is Olga Millers, MD  Chief Complaint  Patient presents with  . Aortic Stenosis    pre TAVR workup    HPI:  The patient is an 79 year old gentleman with known severe symptomatic aortic stenosis who is being evaluated for consideration of TAVR. He has chronic exertional dyspnea with intermittent episodes of resting shortness of breath. He reports occassional mild tightness in the chest with exertion and has had some dizziness for many year. He denies syncope. He has also had lower extremity swelling that improves with diuretic. Past Medical History  Diagnosis Date  . Depression   . GERD (gastroesophageal reflux disease)   . COPD (chronic obstructive pulmonary disease)   . Aortic stenosis     Echo 7/14:  Severe LVH, EF 55-60%, severe AS, mean gradient 78 mmHg, MAC, mild LAE  . Essential hypertension, benign   . Decreased libido   . DIZZINESS, CHRONIC   . Alcohol abuse   . Shortness of breath 10/24/11    "all the time"  . Arthritis     "in the low vertebra"  . Chronic lower back pain   . Scoliosis/kyphoscoliosis 10/28/2011  . Abdominal aortic aneurysm 10/29/2011    3.7cm AAA by CT scan with unusual appearance suggestive of penetrating atherosclerotic ulcer or localized dissection    Past Surgical History  Procedure Laterality Date  . No past surgeries      Family History  Problem Relation Age of Onset  . Other      no known medical hx of heart disease    Social History History  Substance Use Topics  . Smoking status: Former Smoker -- 2.5 years    Types: Cigarettes    Quit date: 07/02/1952  . Smokeless tobacco: Never Used  . Alcohol Use: 37.8 oz/week    42 Glasses of wine, 21 Cans of beer per week     Comment: 10/24/11 "drink q day; quite a bit";  Started 1974 (mostly hard liquor)    Current Outpatient Prescriptions  Medication Sig Dispense Refill  . ALPRAZolam (XANAX) 0.5 MG tablet Take 0.5 mg by mouth 2 (two) times daily as needed.       Marland Kitchen amLODipine (NORVASC) 10 MG tablet Take 10 mg by mouth daily.       . furosemide (LASIX) 20 MG tablet Take 10 mg by mouth daily.      . Multiple Vitamin (MULTIVITAMIN WITH MINERALS) TABS Take 1 tablet by mouth daily.  30 tablet  0  . omega-3 acid ethyl esters (LOVAZA) 1 G capsule Take 1 g by mouth.        . traMADol (ULTRAM) 50 MG tablet Take 50 mg by mouth every 6 (six) hours as needed for pain.      . vitamin B-12 (CYANOCOBALAMIN) 100 MCG tablet Take 50 mcg by mouth daily.         No current facility-administered medications for this visit.   Facility-Administered Medications Ordered in Other Visits  Medication Dose Route Frequency Provider Last Rate Last Dose  . 0.9 %  sodium chloride infusion  1 mL/kg/hr Intravenous Continuous Lewayne Bunting, MD        No Known Allergies  Review of  Systems  Constitutional: Positive for activity change, appetite change, fatigue and unexpected weight change. Negative for fever and chills.  HENT: Negative.   Eyes: Positive for visual disturbance.  Respiratory: Positive for chest tightness and shortness of breath. Negative for cough.   Cardiovascular: Positive for chest pain and leg swelling.       Has orthopnea and PND  Gastrointestinal:       Some difficulty swallowing  Endocrine: Negative.   Genitourinary: Negative.   Musculoskeletal: Positive for arthralgias and gait problem.       Left him pain for the past few months since laying on his side for echo. xrays have shown degenerative disease. Seems to be better with PT and steroid injection.  Skin: Negative.   Allergic/Immunologic: Negative.   Neurological: Positive for dizziness.  Hematological: Negative.   Psychiatric/Behavioral: The patient is nervous/anxious.     BP 137/65  Pulse 85   Resp 20  Ht 6' (1.829 m)  Wt 155 lb (70.308 kg)  BMI 21.02 kg/m2  SpO2 97% Physical Exam HEENT: Unremarkable  Neck: no JVD, no bruits, no adenopathy  Chest: clear to auscultation, symmetrical breath sounds,  severe chest wall deformity due to scoliosis  CV: RRR, grade IV/VI systolic murmur  Abdomen: soft, non-tender, no masses  Extremities: warm, well-perfused, pulses not palpable, 2+ bilateral LE edema  Neuro: Grossly non-focal and symmetrical throughout, motor strength 5/5 in all extremities Skin: Clean and dry, no rashes, brownish discoloration in both lower legs  Diagnostic Tests:  Transthoracic Echocardiography  Patient: Rector, [place] MR #: [id number] Study Date: 10/25/2011 Gender: M Age: 28 Height: 185.4cm Weight: 75.4kg BSA: 1.56m^2 Pt. Status: Room: 4704  PERFORMING [place], [facility] SONOGRAPHER Cathie Beams ADMITTING [place], [place] ORDERING Johnson, [place] ATTENDING [place], [place] cc:  ------------------------------------------------------------ LV EF: 60% - 65%  ------------------------------------------------------------ Indications: Previous study 03/10/2011. Aortic stenosis 424.1.  ------------------------------------------------------------ History: PMH: ETOH abuse. Depression. Chronic dizziness. Murmur. Generalized weakness. Chronic obstructive pulmonary disease. Risk factors: Hypertension.  ------------------------------------------------------------ Study Conclusions  - Left ventricle: The cavity size was normal. Wall thickness was increased in a pattern of mild LVH. Systolic function was normal. The estimated ejection fraction was in the range of 60% to 65%. Wall motion was normal; there were no regional wall motion abnormalities. Doppler parameters are consistent with abnormal left ventricular relaxation (grade 1 diastolic dysfunction). - Aortic valve: There was moderate stenosis. - Mitral valve: Moderately to severely calcified  annulus. - Pulmonary arteries: PA peak pressure: 32mm Hg (S). - Impressions: Technically limited study due to poor sound wave transmission. Impressions:  - Technically limited study due to poor sound wave transmission. Transthoracic echocardiography. M-mode, complete 2D, spectral Doppler, and color Doppler. Height: Height: 185.4cm. Height: 73in. Weight: Weight: 75.4kg. Weight: 165.9lb. Body mass index: BMI: 21.9kg/m^2. Body surface area: BSA: 1.87m^2. Blood pressure: 136/67. Patient status: Inpatient. Location: Echo laboratory.  ------------------------------------------------------------  ------------------------------------------------------------ Left ventricle: The cavity size was normal. Wall thickness was increased in a pattern of mild LVH. Systolic function was normal. The estimated ejection fraction was in the range of 60% to 65%. Wall motion was normal; there were no regional wall motion abnormalities. Doppler parameters are consistent with abnormal left ventricular relaxation (grade 1 diastolic dysfunction).  ------------------------------------------------------------ Aortic valve: Poorly visualized. Moderately calcified leaflets. Doppler: There was moderate stenosis. No significant regurgitation. VTI ratio of LVOT to aortic valve: 0.23. Peak velocity ratio of LVOT to aortic valve: 0.23. Mean velocity ratio of LVOT to aortic valve: 0.23. Mean gradient: 33mm Hg (S). Peak gradient: 61mm  Hg (S).  ------------------------------------------------------------ Aorta: Aortic root: The aortic root was normal in size. Ascending aorta: The ascending aorta was normal in size.  ------------------------------------------------------------ Mitral valve: Moderately to severely calcified annulus. Leaflet separation was normal. Doppler: Transvalvular velocity was within the normal range. There was no evidence for stenosis. No significant regurgitation. Peak gradient: 2mm Hg  (D).  ------------------------------------------------------------ Left atrium: The atrium was normal in size.  ------------------------------------------------------------ Right ventricle: The cavity size was normal. Wall thickness was normal. Systolic function was normal.  ------------------------------------------------------------ Pulmonic valve: Poorly visualized.  ------------------------------------------------------------ Tricuspid valve: Poorly visualized. Doppler: No significant regurgitation.  ------------------------------------------------------------ Right atrium: The atrium was normal in size.  ------------------------------------------------------------ Pericardium: There was no pericardial effusion.  ------------------------------------------------------------  2D measurements Normal Doppler Normal Left ventricle measurements LVID ED, 31.5 mm 43-52 Main pulmonary chord, artery PLAX Pressure, S 32 mm =30 LVID ES, 18 mm 23-38 Hg chord, Left ventricle PLAX Ea, lat 4.39 cm/ ------- FS, chord, 43 % >29 ann, tiss s PLAX DP LVPW, ED 11.9 mm ------ E/Ea, lat 17.15 ------- IVS/LVPW 0.87 <1.3 ann, tiss ratio, ED DP Ventricular septum Ea, med 4.39 cm/ ------- IVS, ED 10.4 mm ------ ann, tiss s Aorta DP Root diam, 24 mm ------ E/Ea, med 17.15 ------- ED ann, tiss Left atrium DP AP dim 33 mm ------ LVOT AP dim 1.68 cm/m^2 <2.2 Peak vel, S 88.7 cm/ ------- index s Mean vel, S 61.3 cm/ ------- s VTI, S 20.8 cm ------- Aortic valve Peak vel, S 392 cm/ ------- s Mean vel, S 261 cm/ ------- s VTI, S 88.9 cm ------- Mean 33 mm ------- gradient, S Hg Peak 61 mm ------- gradient, S Hg VTI ratio 0.23 ------- LVOT/AV Peak vel 0.23 ------- ratio, LVOT/AV Vmean ratio 0.23 ------- LVOT/AV Mitral valve Peak E vel 75.3 cm/ ------- s Peak A vel 150 cm/ ------- s Deceleratio 282 ms 150-230 n time Peak 2 mm ------- gradient, D Hg Peak E/A 0.5  ------- ratio Tricuspid valve Regurg peak 259 cm/ ------- vel s Peak RV-RA 27 mm ------- gradient, S Hg Systemic veins Estimated 5 mm ------- CVP Hg Right ventricle Pressure, S 32 mm <30 Hg Sa vel, lat 14.7 cm/ ------- ann, tiss s DP  ------------------------------------------------------------  Cardiac Catheterization Procedure Note  Name: DONELL TOMKINS  MRN: [id number]  DOB: 07/26/1926  Procedure: Right Heart Cath, Placement of catheters without left heart cath, Selective Coronary Angiography, aortic root angiography  Indication: Aortic stenosis  Procedural Details: The right groin was prepped, draped, and anesthetized with 1% lidocaine. Using the modified Seldinger technique a 6 French sheath was placed in the right femoral artery and a 7 French sheath was placed in the right femoral vein. A Swan-Ganz catheter was used for the right heart catheterization. We were unable to get up beyond the RA due to marked scoliosis. Only RA pressure was measured. We did a hand injection. Standard Judkins catheters were used for selective coronary angiography and aortic root, but was difficult due to the tortuosity. . There were no immediate procedural complications. The patient was transferred to the post catheterization recovery area for further monitoring.  Procedural Findings:  Hemodynamics  RA 9 (?) if RA  RV not done  PA not done  PCWP not done  LV not done  AO 146/63 (97)  Oxygen saturations:  PA not done  AO 94% on O2  Cardiac Output (Fick) not done  Cardiac Index (Fick) not done  Coronary angiography:  Coronary dominance: right  Aortic root demonstrated  a high calcified valve with minimal mobility. There was at least moderate AR, graded at 2-3 plus.  Left mainstem: short no obstruction  Left anterior descending (LAD): Mild irregularity without obstruction  Left circumflex (LCx): Minor luminal irregularity without obstruction.  Right coronary artery (RCA): Large caliber  vessel with mild proximal irregularity, but no critical disease. Large PDA and PLA.  Left ventriculography: Left ventricular systolic function is normal, LVEF is estimated at 55-65%, there is no significant mitral regurgitation  Final Conclusions:  1. Severe AS by multiple echoes, with AI by angio  2. No significant coronary obstruction  3. Unable to get to RA from the femoral approach due to severe scoliosis  Recommendations:  1. Spoke with Dr. Elpidio Galea surgical consult  2. RHC from IJ with doppler if needed  3. ? AVR versus TAVR consideration. The latter would likely require transapical or more central approach. See catheter orientation on fluoro.  [name]  10/27/2011, 10:32 AM    Pulmonary Function Tests (10/30/2011)  Baseline Post-bronchodilator  FVC 2.12 L (56% predicted) FVC 2.06 L (55% predicted)  FEV1 1.46 L (55% predicted) FEV1 1.56 L (59% predicted)  FEF25-75 0.93 L (53% predicted) FEF25-75 1.15 L (66% predicted)  RV 2.99 L (112% predicted)  DLCO 46% predicted  Interpretation c/w moderate-severe COPD    STS Risk Calculator  Procedure AVR  Risk of Mortality 3.3%  Morbidity or Mortality 19%  Prolonged LOS 8%  Short LOS 30%  Permanent Stroke 1.4%  Prolonged Vent Support 11%  DSW Infection 0.4%  Renal Failure 5%  Reoperation 8%   CARDIAC GATED CT ANGIOGRAM OF THE HEART          **ADDENDUM** CREATED: 12/25/2012 16:06:23  OVER-READ INTERPRETATION - CT CHEST  The following report is an over-read performed by radiologist Dr.  Reyes Ivan, M.D. of [facility] on 12/25/2012  16:06:23. This over-read does not include interpretation of  cardiac or coronary anatomy or pathology. The CTA interpretation  by the cardiologist is attached.  Comparison: CT chest 10/29/2011.  Findings: Mediastinal lymph nodes measure up to 9 mm in short axis.  No hilar or right axillary adenopathy. Left axilla was not  included in the field of view. Pulmonary  arteries are borderline  enlarged, indicative of pulmonary arterial hypertension.  Scarring in the right middle lobe and lingula. Probable mild  dependent atelectasis and/or scarring in the right lower lobe.  Vague ground-glass in the subpleural left upper lobe (series [id number],  image 40) is likely infectious or inflammatory in etiology. 3 mm  subpleural nodule in the lingula (series [id number], image 60) is likely  stable.  Incidental imaging of the upper abdomen shows a 3.6 cm low  attenuation lesion off the upper pole right kidney, likely a cyst.  Degenerative changes in the spine which is severely scoliotic.  IMPRESSION:  No acute findings in the extracardiac portion of the chest.  **END ADDENDUM** SIGNED BY: Reyes Ivan, M.D.       Study Result     Cardiac TAVR CTA:  Indication: Aortic Stenosis TAVR Planning  Protocol: The patient was scanned on a Philips 256 scanner. A 120  kV retrospective scan was triggered in the descending thoracic  aorta at 111 HU's. Gantry rotation speed was 270 msecs and  collimation was .9 mm. No beta blockade or nitro were given. The  3D data set was reconstructed in 5% intervals of the R-R cycle.  Systolic and diastolic phases were analyzed on a dedicated work  station using MPR, MIP and VRT modes. The patient received 80 cc  of contrast.  Findings:  Aortic Valve: Tri-leaflet with heavy calcification of all 3  leaflets. Extensive calcification of the intervalvular fibrosa sub-  valvular making annular measurements a bit challenging.  Calcification of the anterior leaflet of the mitral valve as well  Aorta: Marked Scoliosis. The descending thoracic aorta is  markedly tortuous with a near 90 degree angle making transfemoral  approach particularly for a 29mm valve seem unlikely.  Ascending Aorta: 3.3 cm  STJ: 29.6 cm  Descnding Thoracic: 2.65 cm  The ascending thoracic aorta has mild calcification and lies about  1.5 cm to the right of the sternum  The right internal mammary  artery courses directly over the aorta in this position.  Sinus Measurements:  NCS: 36.9 mm  LCS: 35 mm  RCS: 34.7 mm  RCA height above annulus: 17 mm  LM height above annulus: 14.2 mm  Virtual Basal Annulus Measurements:  Maximum/Minimum Diameter: Annulus is very elliptical 30.89mm/21.2  mm  Perimeter: 86.6 mm  Area: 552 mm2  Coronary Arteries: Right dominant with no anomalies. LM is  normal. LAD has non obstructive calcific disease. First diagonal  small and normal. D2 large and normal Circumflex small primarily  an AV groove branch and normal. RCA dominant and normal. No  critical CAD  Impression:  1) Calcified tri-leaflet AV with VBA area of 552 mm2. Suitable  for a soft 29mm Sapien XT valve  2) Marked Scoliosis with tortuous thoracic aorta making  transfemoral approach with a large valve seem unlikely to be  successful Trans aortic approach may be best with care to avoid the  RIMA  3) No critical CAD with ostial origins sufficiently high to  place stented valve  Charlton Haws MD [facility]  Original Report Authenticated By: Charlton Haws, M.D.     *RADIOLOGY REPORT*  Clinical Data: Rule out aneurysm or dissection. Preop evaluation.  CT ANGIOGRAPHY CHEST, ABDOMEN AND PELVIS  Technique: Multidetector CT imaging through the chest, abdomen and  pelvis was performed using the standard protocol during bolus  administration of intravenous contrast. Multiplanar reconstructed  images including MIPs were obtained and reviewed to evaluate the  vascular anatomy.  Contrast: OMNIPAQUE IOHEXOL 350 MG/ML SOLN  Comparison: Chest radiograph 10/29/2011  CTA CHEST  Findings: The patient has severe S-shaped scoliosis of the  thoracic spine. There is no evidence for a thoracic aortic  dissection or intramural hematoma. The thoracic aorta is very  tortuous and related to the severe scoliosis. There are extensive  calcifications involving the aortic valve. Small  calcifications  involving the left anterior descending coronary artery. No  significant pericardial or pleural fluid. The ascending thoracic  aorta measures up to 3.5 cm. The great vessels are patent.  Limited evaluation of the right common carotid artery origin due to  artifact from the adjacent vein and contrast. Descending thoracic  aorta roughly measures 2.8 cm. No evidence for a large pulmonary  embolism.  There is material within the mainstem bronchus which most likely  represents mucus. There is narrowing and compression of the right  lower lobe bronchus which may be associated with the scoliosis and  there is volume loss within the right lower lobe. Densities in the  right lower lobe could represent atelectasis or scarring.  Otherwise, the lungs are clear.  Review of the MIP images confirms the above findings.  IMPRESSION:  The thoracic aorta is very tortuous and associated with the severe  thoracic scoliosis. There is no evidence for aortic dissection or  significant dilatation. Great vessels are patent.  The aortic valve is heavily calcified.  There is marked volume loss in the right lower lobe with focal  scarring or consolidation. The right lower lobe bronchus appears  to be compressed and probably related to the scoliosis.  CTA ABDOMEN AND PELVIS  Findings: Vascular findings: There is plaque and narrowing at the  origin of the celiac trunk. Left gastric artery may be originating  from the aorta or near the origin of celiac trunk. The origin of  the superior mesenteric artery is heavily calcified. The distal  SMA is patent. Calcifications at the origin of the renal arteries  which are patent bilaterally. There is an eccentric aneurysm  involving the right posterior aspect of the infrarenal abdominal  aorta. The aortic aneurysm measures 3.7 x 3.4 cm. The posterior  aortic lumen is very irregular at the level of the aneurysm. The  aneurysm contains mural thrombus. The  configuration of this  aneurysm is unusual and raises concern for a pseudoaneurysm,  possibly secondary to a penetrating ulcer. The inferior mesenteric  artery is patent. There is close to 50% luminal narrowing of the  proximal left common iliac artery. There is edema in the right  groin related to previous catheterization. External iliac arteries  and femoral arteries are widely patent. Minimal plaque involving  the common femoral arteries.  Nonvascular structures: No gross abnormality to the liver,  gallbladder, spleen, kidneys or adrenal tissue. Evidence for a  cyst involving the right kidney upper pole. No gross abnormality  to the pancreas. No significant free fluid or lymphadenopathy.  Urinary bladder is decompressed. No gross abnormality to the  prostate. There is extensive colonic diverticulosis.  Review of the MIP images confirms the above findings.  IMPRESSION:  Eccentric aneurysm involving infrarenal abdominal aorta, measures  up to 3.7 cm. 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  Close to 50% narrowing of the proximal left common iliac artery.  Otherwise, there is no significant stenosis involving the iliac  arteries.  Plaque and calcification at the origin of the visceral arteries.  Extensive colonic diverticulosis.  These results were called by telephone on 10/29/2011 at 3:15 p.m.  to Dr. Cornelius Moras, who verbally acknowledged these results.  Original Report Authenticated By: Richarda Overlie, M.D.       Impression:  Mr. Witter has critical symptomatic aortic stenosis with NYHA class III CHF. The acute exacerbation of pain in his left hip seems to be considerably improved since he underwent steroid injection. He was doing poorly before this so there may be a window of opportunity to proceed with definitive treatment of the patient's aortic stenosis using transcatheter aortic valve replacement as an alternative to extremely high-risk conventional  surgery. While his STS risk of mortality is only 3.3% I think his operative mortality would be significantly higher (>15%) due to his age, degenerative arthritis and decreased mobility, severe scoliosis, and generalized progressive weakness and weight loss due to his aortic stenosis. The patient's recent cardiac gated CT angiogram of the heart demonstrates anatomical characteristics potentially feasible for transcatheter aortic valve replacement via direct aortic approach through a right anterior mini thoracotomy. There is considerable calcification in the aortic annulus which could increase the possibility of perivalvular leak or annular disruption. The patient does not have suitable arterial access for transfemoral approach because of the presence of a small infrarenal abdominal aortic aneurysm and extreme tortuosity of  the patient's thoracic and abdominal aorta.     Plan:  We plan to proceed with TAVR via direct aortic approach through a right anterior mini thoractomy on 01/21/2013.

## 2013-01-14 ENCOUNTER — Ambulatory Visit: Payer: Medicare Other | Admitting: Physical Therapy

## 2013-01-14 NOTE — H&P (Signed)
[address]       Samuel [name] [address]             [phone]          CARDIOTHORACIC SURGERY HISTORY AND PHYSICAL EXAM  Referring Provider is Samuel [name], Samuel Frieze, MD PCP is Samuel Ok, MD    Chief Complaint   Patient presents with   .  Aortic Stenosis       Discuss surgery for severe aortic stenosis, 2D ECHO 10/15/2012     HPI:  Patient is an 79 year old male who has been followed for several years by Dr. Jens [name] with known history of severe symptomatic aortic stenosis and alcoholism.  The patient has historically remained adamantly opposed to any considerations for elective aortic valve replacement.  I first saw him in consultation in July of 2013 when he was hospitalized for congestive heart failure.  At that time he continued to resist any thoughts regarding possible elective aortic valve replacement.  The patient reports that over the last year he has gone downhill considerably. He gets short of breath with minimal physical activity and occasionally at rest, consistent with chronic combined systolic and diastolic CHF, functional class III . He reports that he is feeling weaker and weaker. He has frequent dizzy spells without syncope. He reports only occasional mild, transient chest discomfort. He denies PND, orthopnea, or lower extremity edema. As his breathing has deteriorated over the past few months he has reconsidered the possibility of considering elective surgical intervention. He was seen recently by Samuel [name] and a followup echocardiogram was performed confirming further progression in the severity of aortic stenosis.  Peak velocity across the aortic valve now measures 5.6 m/s corresponding to peak and mean transvalvular gradients estimated 127 and 78 mm mercury respectively. Left ventricular systolic function remains reasonably well preserved with ejection fraction estimated 55-60%. Diagnostic cardiac catheterization performed July of 2013 was notable for  the absence of significant coronary artery disease. Right heart catheterization was not performed at that time.  The patient is widowed and divorced and lives alone. His daughter lives locally in [place].  He has a sister who lives in [place]. He reports that he remains functionally independent although he has clearly gone downhill. He admits that he has had a problem with alcohol in the past.  He states that he still drinks some alcohol on a regular basis, but both he and his daughter report that his alcohol consumption has been less recently.  The patient was seen by Dr. Excell Seltzer who discussed options with him further on 11/08/2012.  At that time the patient's biggest complaint was that of left hip pain which he states began after he had to lie on his left side when he underwent followup echocardiogram.  Plain film x-rays were obtained which showed mild degenerative change in both hips, right more so than the left.  Dr. Excell Seltzer did not feel that the patient needed repeat cardiac catheterization before proceeding with definitive treatment for his severe aortic stenosis. Because of severe tortuosity of the aorta and the presence of small infrarenal abdominal aortic aneurysm, the patient would not be candidate for transcatheter aortic valve replacement via transfemoral approach.   Patient returns for followup of severe symptomatic aortic stenosis. He was last seen here in the office 12/16/2012.   Since then he underwent steroid injection of his left hip and he has been seen in followup by physical therapy. He reports that he is doing much better. He  still has some pain in his hip and back but he states that he has been getting around much more easily. He has been having increasing episodes of resting shortness of breath and dizzy spells. He seemed to be transient and intermittent. For the last few days he has been feeling better. Last week he had run out of his blood pressure medications but he has now gotten  his prescription filled. He denies any fevers or chills. He denies any productive cough. He still feels quite weak and his appetite is not good. The remainder of his review of systems is unchanged from previously.      Past Medical History  Diagnosis Date  . Depression   . GERD (gastroesophageal reflux disease)   . COPD (chronic obstructive pulmonary disease)   . Aortic stenosis     Echo 7/14:  Severe LVH, EF 55-60%, severe AS, mean gradient 78 mmHg, MAC, mild LAE  . Essential hypertension, benign   . Decreased libido   . DIZZINESS, CHRONIC   . Alcohol abuse   . Shortness of breath 10/24/11    "all the time"  . Arthritis     "in the low vertebra"  . Chronic lower back pain   . Scoliosis/kyphoscoliosis 10/28/2011  . Abdominal aortic aneurysm 10/29/2011    3.7cm AAA by CT scan with unusual appearance suggestive of penetrating atherosclerotic ulcer or localized dissection    Past Surgical History  Procedure Laterality Date  . No past surgeries      Family History  Problem Relation Age of Onset  . Other      no known medical hx of heart disease    Social History History  Substance Use Topics  . Smoking status: Former Smoker -- 2.5 years    Types: Cigarettes    Quit date: 07/02/1952  . Smokeless tobacco: Never Used  . Alcohol Use: 37.8 oz/week    42 Glasses of wine, 21 Cans of beer per week     Comment: 10/24/11 "drink q day; quite a bit"; Started 1974 (mostly hard liquor)    Prior to Admission medications   Medication Sig Start Date End Date Taking? Authorizing Provider  ALPRAZolam Prudy Feeler) 0.5 MG tablet Take 0.5 mg by mouth 2 (two) times daily as needed.  10/09/12   Historical Provider, MD  amLODipine (NORVASC) 10 MG tablet Take 10 mg by mouth daily.     Historical Provider, MD  furosemide (LASIX) 20 MG tablet Take 10 mg by mouth daily.    Historical Provider, MD  Multiple Vitamin (MULTIVITAMIN WITH MINERALS) TABS Take 1 tablet by mouth daily. 10/30/11   Penny Pia, MD    omega-3 acid ethyl esters (LOVAZA) 1 G capsule Take 1 g by mouth.      Historical Provider, MD  traMADol (ULTRAM) 50 MG tablet Take 50 mg by mouth every 6 (six) hours as needed for pain.    Historical Provider, MD  vitamin B-12 (CYANOCOBALAMIN) 100 MCG tablet Take 50 mcg by mouth daily.      Historical Provider, MD    No Known Allergies   Review of Systems:              General:                      decreased appetite, decreased energy, no weight gain, + weight loss, no fever             Cardiac:  no chest pain with exertion, no chest pain at rest, + SOB with minimal exertion, + occasional resting SOB, no PND, no orthopnea, no palpitations, no arrhythmia, no atrial fibrillation, no LE edema, + dizzy spells, no syncope             Respiratory:                + shortness of breath, no home oxygen, no productive cough, no dry cough, no bronchitis, no wheezing, no hemoptysis, no asthma, no pain with inspiration or cough, no sleep apnea, no CPAP at night             GI:                                no difficulty swallowing, no reflux, no frequent heartburn, no hiatal hernia, no abdominal pain, no constipation, no diarrhea, no hematochezia, no hematemesis, no melena             GU:                              no dysuria,  no frequency, no urinary tract infection, no hematuria, no enlarged prostate, no kidney stones, no kidney disease             Vascular:                     no pain suggestive of claudication, no pain in feet, no leg cramps, no varicose veins, no DVT, no non-healing foot ulcer             Neuro:                         no stroke, no TIA's, no seizures, no headaches, no temporary blindness one eye,  no slurred speech, no peripheral neuropathy, no chronic pain, + chronic instability of gait, no memory/cognitive dysfunction             Musculoskeletal:         + arthritis, no joint swelling, no myalgias, + difficulty walking - uses a walker for balance, limited  mobility               Skin:                            no rash, no itching, no skin infections, no pressure sores or ulcerations             Psych:                         no anxiety, no depression, no nervousness, no unusual recent stress             Eyes:                           no blurry vision, no floaters, no recent vision changes, does not wears glasses or contacts             ENT:                            no hearing loss, edentulous with full dentures  Hematologic:               + easy bruising, no abnormal bleeding, no clotting disorder, no frequent epistaxis             Endocrine:                   no diabetes, does not check CBG's at home                           Physical Exam:              BP 121/62  Pulse 80  Resp 20  Ht 5\' 11"  (1.803 m)  Wt 181 lb (82.101 kg)  BMI 25.26 kg/m2  SpO2 96%             General:                      Elderly, fraill-appearing             HEENT:                       Unremarkable               Neck:                           no JVD, no bruits, no adenopathy               Chest:                         clear to auscultation, symmetrical breath sounds, no wheezes, no rhonchi               CV:                              RRR, harsh grade V/VI crescendo/decrescendo systolic murmur               Abdomen:                    soft, non-tender, no masses               Extremities:                 warm, well-perfused, pulses diminished, no LE edema             Rectal/GU                   Deferred             Neuro:                         Grossly non-focal and symmetrical throughout             Skin:                            Clean and dry, no rashes, no breakdown   Diagnostic Tests:  Transthoracic Echocardiography  Patient: Samuel [name], Samuel [name] MR #: [id number] Study Date: 10/15/2012 Gender: M Age: 42 Height: 180.3cm Weight: 82.1kg BSA: 2.7m^2 Pt. Status: Room:  ATTENDING Delfin Edis, Scott REFERRING Alben Spittle,  Scott SONOGRAPHER Aida Raider, RDCS PERFORMING [facility], Site  3 cc:  ------------------------------------------------------------ LV EF: 55% - 60%  ------------------------------------------------------------ Indications: 424.1 Aortic valve disorders.  ------------------------------------------------------------ History: PMH: Acquired from the patient and from the patient's chart. PMH: AAA. Scoliosis/Kyphoscoliosis. COPD. Chronic Dizziness. Murmur. Dyspnea. Risk factors: History Alcohol Abuse. Former tobacco use. Hypertension.  ------------------------------------------------------------ Study Conclusions  - Left ventricle: The cavity size was mildly dilated. Wall thickness was increased in a pattern of severe LVH. Systolic function was normal. The estimated ejection fraction was in the range of 55% to 60%. - Aortic valve: There was severe stenosis. Trivial regurgitation. Mean gradient: 78mm Hg (S). Peak gradient: Hg (S). - Mitral valve: Calcified annulus. - Left atrium: The atrium was mildly dilated. - Atrial septum: No defect or patent foramen ovale was identified. Transthoracic echocardiography. M-mode, complete 2D, spectral Doppler, and color Doppler. Height: Height: 180.3cm. Height: 71in. Weight: Weight: 82.1kg. Weight: 180.6lb. Body mass index: BMI: 25.2kg/m^2. Body surface area: BSA: 2.60m^2. Blood pressure: 139/58. Patient status: Outpatient. Location: [facility]  ------------------------------------------------------------  ------------------------------------------------------------ Left ventricle: The cavity size was mildly dilated. Wall thickness was increased in a pattern of severe LVH. Systolic function was normal. The estimated ejection fraction was in the range of 55% to 60%.  ------------------------------------------------------------ Aortic valve: Severely calcified leaflets. Doppler: There was severe stenosis. Trivial  regurgitation. VTI ratio of LVOT to aortic valve: 0.18. Valve area: 0.74cm^2(VTI). Indexed valve area: 0.36cm^2/m^2 (VTI). Peak velocity ratio of LVOT to aortic valve: 0.13. Valve area: 0.53cm^2 (Vmax). Indexed valve area: 0.26cm^2/m^2 (Vmax). Mean gradient: 78mm Hg (S). Peak gradient: Hg (S).  ------------------------------------------------------------ Mitral valve: Calcified annulus. Doppler: Trivial regurgitation. Peak gradient: 7mm Hg (D).  ------------------------------------------------------------ Left atrium: The atrium was mildly dilated.  ------------------------------------------------------------ Atrial septum: No defect or patent foramen ovale was identified.  ------------------------------------------------------------ Right ventricle: The cavity size was normal. Wall thickness was normal. Systolic function was normal.  ------------------------------------------------------------ Pulmonic valve: Doppler: Trivial regurgitation.  ------------------------------------------------------------ Tricuspid valve: Doppler: Mild regurgitation.  ------------------------------------------------------------ Right atrium: The atrium was normal in size.  ------------------------------------------------------------ Pericardium: The pericardium was normal in appearance.  ------------------------------------------------------------  2D measurements Normal Doppler measurements Normal Left ventricle Main pulmonary LVID ED, 35.3 mm 43-52 artery chord, Pressure, 20 mm Hg =30 PLAX S LVID ES, 28.2 mm 23-38 Left ventricle chord, Ea, lat 6.32 cm/s ------ PLAX ann, tiss FS, chord, 20 % >29 DP PLAX E/Ea, lat 20.8 ------ LVPW, ED 15.1 mm ------ ann, tiss 9 IVS/LVPW 1.1 <1.3 DP ratio, ED Ea, med 6.58 cm/s ------ Ventricular septum ann, tiss IVS, ED 16.6 mm ------ DP LVOT E/Ea, med 20.0 ------ Diam, S 23 mm ------ ann, tiss 6 Area 4.15 cm^2 ------ DP Diam 23 mm ------  LVOT Aorta Peak vel, 71.9 cm/s ------ Root diam, 35 mm ------ S ED VTI, S 24.6 cm ------ Left atrium Stroke vol 102. ml ------ AP dim 44 mm ------ 2 AP dim 2.18 cm/m^2 <2.2 Stroke 50.6 ml/m^2 ------ index index Aortic valve Peak vel, 564 cm/s ------ S Mean vel, 404 cm/s ------ S VTI, S 139 cm ------ Mean 78 mm Hg ------ gradient, S Peak 127 mm Hg ------ gradient, S VTI ratio 0.18 ------ LVOT/AV Area, VTI 0.74 cm^2 ------ Area index 0.36 cm^2/m ------ (VTI) ^2 Peak vel 0.13 ------ ratio, LVOT/AV Area, Vmax 0.53 cm^2 ------ Area index 0.26 cm^2/m ------ (Vmax) ^2 Regurg PHT 309 ms ------ Mitral valve Peak E vel 132 cm/s ------ Peak A vel 96.7 cm/s ------ Decelerati 243 ms 150-23 on time 0 Peak 7 mm Hg ------  gradient, D Peak E/A 1.4 ------ ratio Tricuspid valve Regurg 195 cm/s ------ peak vel Peak RV-RA 15 mm Hg ------ gradient, S Systemic veins Estimated 5 mm Hg ------ CVP Right ventricle Pressure, 20 mm Hg <30 S Sa vel, 14.7 cm/s ------ lat ann, tiss DP  ------------------------------------------------------------ Prepared and Electronically Authenticated by  Charlton Haws 2014-07-15T18:26:[id number]    Cardiac Catheterization Procedure Note  Name: MIKKEL CHARRETTE   MRN: [id number]   DOB: June 07, 1926   Procedure: Right Heart Cath, Placement of catheters without left heart cath, Selective Coronary Angiography, aortic root angiography   Indication: Aortic stenosis   Procedural Details: The right groin was prepped, draped, and anesthetized with 1% lidocaine. Using the modified Seldinger technique a 6 French sheath was placed in the right femoral artery and a 7 French sheath was placed in the right femoral vein. A Swan-Ganz catheter was used for the right heart catheterization. We were unable to get up beyond the RA due to marked scoliosis. Only RA pressure was measured. We did a hand injection. Standard Judkins catheters were used for selective coronary  angiography and aortic root, but was difficult due to the tortuosity. . There were no immediate procedural complications. The patient was transferred to the post catheterization recovery area for further monitoring.   Procedural Findings:   Hemodynamics   RA 9 (?) if RA   RV not done   PA not done   PCWP not done   LV not done   AO 146/63 (97)   Oxygen saturations:   PA not done   AO 94% on O2   Cardiac Output (Fick) not done   Cardiac Index (Fick) not done   Coronary angiography:   Coronary dominance: right   Aortic root demonstrated a high calcified valve with minimal mobility. There was at least moderate AR, graded at 2-3 plus.   Left mainstem: short no obstruction   Left anterior descending (LAD): Mild irregularity without obstruction   Left circumflex (LCx): Minor luminal irregularity without obstruction.   Right coronary artery (RCA): Large caliber vessel with mild proximal irregularity, but no critical disease. Large PDA and PLA.   Left ventriculography: Left ventricular systolic function is normal, LVEF is estimated at 55-65%, there is no significant mitral regurgitation   Final Conclusions:   1. Severe AS by multiple echoes, with AI by angio   2. No significant coronary obstruction   3. Unable to get to RA from the femoral approach due to severe scoliosis   Recommendations:   1. Spoke with Dr. Elpidio Galea surgical consult   2. RHC from IJ with doppler if needed   3. ? AVR versus TAVR consideration. The latter would likely require transapical or more central approach. See catheter orientation on fluoro.   [name]   10/27/2011, 10:32 AM     CT ANGIOGRAPHY CHEST, ABDOMEN AND PELVIS  Technique: Multidetector CT imaging through the chest, abdomen and   pelvis was performed using the standard protocol during bolus   administration of intravenous contrast. Multiplanar reconstructed   images including MIPs were obtained and reviewed to evaluate the   vascular  anatomy.   Contrast: OMNIPAQUE IOHEXOL 350 MG/ML SOLN   Comparison: Chest radiograph 10/29/2011   CTA CHEST   Findings: The patient has severe S-shaped scoliosis of the   thoracic spine. There is no evidence for a thoracic aortic   dissection or intramural hematoma. The thoracic aorta is very   tortuous and related to the severe scoliosis. There are  extensive   calcifications involving the aortic valve. Small calcifications   involving the left anterior descending coronary artery. No   significant pericardial or pleural fluid. The ascending thoracic   aorta measures up to 3.5 cm. The great vessels are patent.   Limited evaluation of the right common carotid artery origin due to   artifact from the adjacent vein and contrast. Descending thoracic   aorta roughly measures 2.8 cm. No evidence for a large pulmonary   embolism.   There is material within the mainstem bronchus which most likely   represents mucus. There is narrowing and compression of the right   lower lobe bronchus which may be associated with the scoliosis and   there is volume loss within the right lower lobe. Densities in the   right lower lobe could represent atelectasis or scarring.   Otherwise, the lungs are clear.   Review of the MIP images confirms the above findings.   IMPRESSION:   The thoracic aorta is very tortuous and associated with the severe   thoracic scoliosis. There is no evidence for aortic dissection or   significant dilatation. Great vessels are patent.   The aortic valve is heavily calcified.   There is marked volume loss in the right lower lobe with focal   scarring or consolidation. The right lower lobe bronchus appears   to be compressed and probably related to the scoliosis.   CTA ABDOMEN AND PELVIS   Findings: Vascular findings: There is plaque and narrowing at the   origin of the celiac trunk. Left gastric artery may be originating   from the aorta or near the origin of celiac trunk. The  origin of   the superior mesenteric artery is heavily calcified. The distal   SMA is patent. Calcifications at the origin of the renal arteries   which are patent bilaterally. There is an eccentric aneurysm   involving the right posterior aspect of the infrarenal abdominal   aorta. The aortic aneurysm measures 3.7 x 3.4 cm. The posterior   aortic lumen is very irregular at the level of the aneurysm. The   aneurysm contains mural thrombus. 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 The inferior mesenteric   artery is patent. There is close to 50% luminal narrowing of the   proximal left common iliac artery. There is edema in the right   groin related to previous catheterization. External iliac arteries   and femoral arteries are widely patent. Minimal plaque involving   the common femoral arteries.   Nonvascular structures: No gross abnormality to the liver,   gallbladder, spleen, kidneys or adrenal tissue. Evidence for a   cyst involving the right kidney upper pole. No gross abnormality   to the pancreas. No significant free fluid or lymphadenopathy.   Urinary bladder is decompressed. No gross abnormality to the   prostate. There is extensive colonic diverticulosis.   Review of the MIP images confirms the above findings.   IMPRESSION:   Eccentric aneurysm involving infrarenal abdominal aorta, measures   up to 3.7 cm. The configuration raises concern for a   pseudoaneurysm at this location.   Close to 50% narrowing of the proximal left common iliac artery.   Otherwise, there is no significant stenosis involving the iliac   arteries.   Plaque and calcification at the origin of the visceral arteries.   Extensive colonic diverticulosis.   These results  were called by telephone on 10/29/2011 at 3:15 p.m.   to Dr. Cornelius Moras, who verbally acknowledged these results.   Original Report Authenticated By: Richarda Overlie, M.D.  CARDIAC GATED CT ANGIOGRAM OF THE HEART                 **ADDENDUM** CREATED: 12/25/2012 16:06:23    OVER-READ INTERPRETATION - CT CHEST   The following report is an over-read performed by radiologist Dr.   Reyes Ivan, M.D. of [facility] on 12/25/2012   16:06:23. This over-read does not include interpretation of   cardiac or coronary anatomy or pathology. The CTA interpretation   by the cardiologist is attached.   Comparison: CT chest 10/29/2011.   Findings: Mediastinal lymph nodes measure up to 9 mm in short axis.   No hilar or right axillary adenopathy. Left axilla was not   included in the field of view. Pulmonary arteries are borderline   enlarged, indicative of pulmonary arterial hypertension.   Scarring in the right middle lobe and lingula. Probable mild   dependent atelectasis and/or scarring in the right lower lobe.   Vague ground-glass in the subpleural left upper lobe (series 16109,   image 40) is likely infectious or inflammatory in etiology. 3 mm   subpleural nodule in the lingula (series 60454, image 60) is likely   stable.   Incidental imaging of the upper abdomen shows a 3.6 cm low   attenuation lesion off the upper pole right kidney, likely a cyst.   Degenerative changes in the spine which is severely scoliotic.   IMPRESSION:   No acute findings in the extracardiac portion of the chest.   **END ADDENDUM** SIGNED BY: Reyes Ivan, M.D.            Study Result        Cardiac TAVR CTA:    Indication: Aortic Stenosis TAVR Planning   Protocol: The patient was scanned on a Philips 256 scanner. A 120   kV retrospective scan was triggered in the descending thoracic   aorta at 111 HU's. Gantry rotation speed was 270 msecs and   collimation was .9 mm. No beta blockade or nitro were given. The   3D data set was reconstructed in 5% intervals of the R-R cycle.   Systolic and diastolic phases were analyzed on a dedicated work   station  using MPR, MIP and VRT modes. The patient received 80 cc   of contrast.   Findings:   Aortic Valve: Tri-leaflet with heavy calcification of all 3   leaflets. Extensive calcification of the intervalvular fibrosa sub-   valvular making annular measurements a bit challenging.   Calcification of the anterior leaflet of the mitral valve as well   Aorta: Marked Scoliosis. The descending thoracic aorta is   markedly tortuous with a near 90 degree angle making transfemoral   approach particularly for a 29mm valve seem unlikely.   Ascending Aorta: 3.3 cm   STJ: 29.6 cm   Descnding Thoracic: 2.65 cm   The ascending thoracic aorta has mild calcification and lies about   1.5 cm to the right of the sternum The right internal mammary   artery courses directly over the aorta in this position.   Sinus Measurements:   NCS: 36.9 mm   LCS: 35 mm   RCS: 34.7 mm   RCA height above annulus: 17 mm   LM height above annulus: 14.2 mm   Virtual Basal Annulus Measurements:   Maximum/Minimum Diameter:  Annulus is very elliptical 30.65mm/21.2   mm   Perimeter: 86.6 mm   Area: 552 mm2   Coronary Arteries: Right dominant with no anomalies. LM is   normal. LAD has non obstructive calcific disease. First diagonal   small and normal. D2 large and normal Circumflex small primarily   an AV groove branch and normal. RCA dominant and normal. No   critical CAD   Impression:   1) Calcified tri-leaflet AV with VBA area of 552 mm2. Suitable   for a soft 29mm Sapien XT valve   2) Marked Scoliosis with tortuous thoracic aorta making   transfemoral approach with a large valve seem unlikely to be   successful Trans aortic approach may be best with care to avoid the   RIMA   3) No critical CAD with ostial origins sufficiently high to   place stented valve   Charlton Haws MD [facility]   Original Report Authenticated By: Charlton Haws, M.D.     Pulmonary Function Tests (10/30/2011)  Baseline Post-bronchodilator  FVC 2.12 L (56%  predicted) FVC 2.06 L (55% predicted)  FEV1 1.46 L (55% predicted) FEV1 1.56 L (59% predicted)  FEF25-75 0.93 L (53% predicted) FEF25-75 1.15 L (66% predicted)  RV 2.99 L (112% predicted)  DLCO 46% predicted  Interpretation c/w moderate-severe COPD    STS Risk Calculator  Procedure    AVR  Risk of Mortality   6.0% Morbidity or Mortality  26.9% Prolonged LOS   14.0% Short LOS    18.7% Permanent Stroke   2.1% Prolonged Vent Support  16.1% DSW Infection    0.5% Renal Failure    7.5% Reoperation    11.2%    Impression:  Patient has critical symptomatic aortic stenosis with reasonably well preserved left ventricular systolic function, although EF has decreased slightly over the past year.  Previous catheterization performed 1 year ago was notable for the absence of significant coronary artery disease. CT angiogram of the chest abdomen and pelvis was notable for the absence of significant calcification or other significant disease afflicting the thoracic aorta, but the patient did have a small (3.7 cm) infrarenal abdominal aortic aneurysm and his aorta was extremely tortuous because of the patient's underlying severe kyphoscoliosis.  Based upon the patient's age and associated other comorbid medical conditions the patient's predicted risk of mortality would be moderately elevated. However, the patient has struggled with alcoholism for many years and physically appears to have declined considerably over the past year, presumably due to to his aortic stenosis.  I'm concerned that his risk of significant morbidity would be considerably higher than predicted with predicted risk of mortality >15% and potential for long-term, permanent morbidity >50%, and I do not feel that he should be considered as a candidate for conventional surgical aortic valve replacement.  The patient's recent cardiac gated CT angiogram of the heart demonstrates anatomical characteristics potentially feasible for transcatheter  aortic valve replacement via direct aortic approach through a right anterior mini thoracotomy.  However, there is considerable calcification in the aortic annulus which could increase the possibility of perivalvular leak or annular disruption.  The patient's recent acute exacerbation of pain in his left hip seems to be considerably improved since he underwent steroid injection. He also feels that some recommendations made by the physical therapist have helped him considerably as well. This may be a window of opportunity to proceed with definitive treatment of the patient's aortic stenosis using transcatheter aortic valve replacement as an alternative to extremely high-risk conventional  surgery.    Plan:  I have again reviewed options with Mr. Goynes in the office today. We plan to proceed with TAVR via direct aortic approach through a right anterior mini thoractomy on 01/21/2013.  Following the decision to proceed with transcatheter aortic valve replacement, a discussion has been held regarding what types of management strategies would be attempted intraoperatively in the event of life-threatening complications, including whether or not the patient would be considered a candidate for the use of cardiopulmonary bypass and/or conversion to open sternotomy for attempted surgical intervention.  The patient specifically requests that in the event that complications develop we would do anything possible if we thought there was a reasonable chance for satisfactory functional outcome.  The patient has been advised of a variety of complications that might develop including but not limited to risks of death, stroke, paravalvular leak, aortic dissection or other major vascular complications, aortic annulus rupture, device embolization, cardiac rupture or perforation, mitral regurgitation, acute myocardial infarction, arrhythmia, heart block or bradycardia requiring permanent pacemaker placement, congestive heart failure,  respiratory failure, renal failure, pneumonia, infection, other late complications related to structural valve deterioration or migration, or other complications that might ultimately cause a temporary or permanent loss of functional independence or other long term morbidity.  The patient provides full informed consent for the procedure as described and all questions were answered.  Mr. Reczek has expressed an interest in discussing the possibility that his sister might be designated his medical power of attorney.  We have referred him to the legal counseling representatives at the hospital to assist.    Salvatore Decent. Cornelius Moras, MD 01/08/2013 5:00 PM

## 2013-01-17 ENCOUNTER — Other Ambulatory Visit: Payer: Self-pay

## 2013-01-17 ENCOUNTER — Encounter (HOSPITAL_COMMUNITY): Payer: Self-pay

## 2013-01-17 ENCOUNTER — Encounter (HOSPITAL_COMMUNITY)
Admission: RE | Admit: 2013-01-17 | Discharge: 2013-01-17 | Disposition: A | Discharge status: Home / Self Care | Payer: Medicare Other | Source: Ambulatory Visit | Attending: Thoracic Surgery (Cardiothoracic Vascular Surgery) | Admitting: Thoracic Surgery (Cardiothoracic Vascular Surgery)

## 2013-01-17 ENCOUNTER — Ambulatory Visit (HOSPITAL_COMMUNITY)
Admission: RE | Admit: 2013-01-17 | Discharge: 2013-01-17 | Disposition: A | Discharge status: Home / Self Care | Payer: Medicare Other | Source: Ambulatory Visit | Attending: Thoracic Surgery (Cardiothoracic Vascular Surgery) | Admitting: Thoracic Surgery (Cardiothoracic Vascular Surgery)

## 2013-01-17 VITALS — BP 149/62 | HR 74 | Temp 97.7°F | Resp 16 | Ht 69.0 in | Wt 155.0 lb

## 2013-01-17 DIAGNOSIS — Z01811 Encounter for preprocedural respiratory examination: Secondary | ICD-10-CM | POA: Insufficient documentation

## 2013-01-17 DIAGNOSIS — I359 Nonrheumatic aortic valve disorder, unspecified: Secondary | ICD-10-CM | POA: Insufficient documentation

## 2013-01-17 DIAGNOSIS — Z01812 Encounter for preprocedural laboratory examination: Secondary | ICD-10-CM | POA: Insufficient documentation

## 2013-01-17 DIAGNOSIS — Z01818 Encounter for other preprocedural examination: Secondary | ICD-10-CM | POA: Insufficient documentation

## 2013-01-17 DIAGNOSIS — Z01810 Encounter for preprocedural cardiovascular examination: Secondary | ICD-10-CM | POA: Insufficient documentation

## 2013-01-17 HISTORY — DX: Other complications of anesthesia, initial encounter: T88.59XA

## 2013-01-17 HISTORY — DX: Adverse effect of unspecified anesthetic, initial encounter: T41.45XA

## 2013-01-17 HISTORY — DX: Heart failure, unspecified: I50.9

## 2013-01-17 LAB — COMPREHENSIVE METABOLIC PANEL
Albumin: 3.7 g/dL (ref 3.5–5.2)
BUN: 14 mg/dL (ref 6–23)
CO2: 21 mEq/L (ref 19–32)
Calcium: 9.2 mg/dL (ref 8.4–10.5)
Creatinine, Ser: 0.86 mg/dL (ref 0.50–1.35)
GFR calc Af Amer: >90 mL/min (ref >90)
GFR calc non Af Amer: 78 mL/min — ABNORMAL LOW (ref >90)
Glucose, Bld: 81 mg/dL (ref 70–99)
Total Protein: 7.6 g/dL (ref 6.0–8.3)

## 2013-01-17 LAB — URINALYSIS, ROUTINE W REFLEX MICROSCOPIC
Glucose, UA: NEGATIVE mg/dL
Leukocytes, UA: NEGATIVE
Protein, ur: NEGATIVE mg/dL
Urobilinogen, UA: 0.2 mg/dL (ref 0.0–1.0)

## 2013-01-17 LAB — APTT: aPTT: 35 seconds (ref 24–37)

## 2013-01-17 LAB — CBC
Hemoglobin: 13.5 g/dL (ref 13.0–17.0)
MCH: 32.1 pg (ref 26.0–34.0)
MCHC: 34.6 g/dL (ref 30.0–36.0)
MCV: 92.6 fL (ref 78.0–100.0)
Platelets: 115 10*3/uL — ABNORMAL LOW (ref 150–400)
RBC: 4.21 MIL/uL — ABNORMAL LOW (ref 4.22–5.81)
RDW: 13.8 % (ref 11.5–15.5)

## 2013-01-17 LAB — HEMOGLOBIN A1C
Hgb A1c MFr Bld: 5.5 % (ref <5.7)
Mean Plasma Glucose: 111 mg/dL (ref <117)

## 2013-01-17 LAB — PROTIME-INR
INR: 1.19 (ref 0.00–1.49)
Prothrombin Time: 14.8 seconds (ref 11.6–15.2)

## 2013-01-17 LAB — SURGICAL PCR SCREEN
MRSA, PCR: NEGATIVE
Staphylococcus aureus: POSITIVE — AB

## 2013-01-17 LAB — BLOOD GAS, ARTERIAL
Acid-Base Excess: 0.7 mmol/L (ref 0.0–2.0)
Bicarbonate: 24.3 mEq/L — ABNORMAL HIGH (ref 20.0–24.0)
Drawn by: 344381
FIO2: 0.21 %
O2 Saturation: 94.8 %
Patient temperature: 98.6
pO2, Arterial: 73 mmHg — ABNORMAL LOW (ref 80.0–100.0)

## 2013-01-17 LAB — PULMONARY FUNCTION TEST

## 2013-01-17 MED ORDER — ALBUTEROL SULFATE (5 MG/ML) 0.5% IN NEBU
2.5000 mg | INHALATION_SOLUTION | Freq: Once | RESPIRATORY_TRACT | Status: AC
  Administered 2013-01-17: 2.5 mg via RESPIRATORY_TRACT

## 2013-01-17 NOTE — Progress Notes (Signed)
No dopplers needed per ryan at office

## 2013-01-17 NOTE — Pre-Procedure Instructions (Addendum)
Samuel [name]  01/17/2013   Your procedure is scheduled on: 01/21/13  Report to  short stay admitting at 800 AM.  Call this number if you have problems the morning of surgery: [phone]   Remember:   Do not eat food or drink liquids after midnight.   Take these medicines the morning of surgery with A SIP OF WATER: xanax,amlodipine,tramadol if needed   STOP fish oil,multi vit now   Do not wear jewelry, make-up or nail polish.  Do not wear lotions, powders, or perfumes. You may wear deodorant.  Do not shave 48 hours prior to surgery. Men may shave face and neck.  Do not bring valuables to the hospital.  [facility] is not responsible                  for any belongings or valuables.               Contacts, dentures or bridgework may not be worn into surgery.  Leave suitcase in the car. After surgery it may be brought to your room.  For patients admitted to the hospital, discharge time is determined by your                treatment team.               Patients discharged the day of surgery will not be allowed to drive  home.  Name and phone number of your driver:   Special Instructions: Incentive Spirometry - Practice and bring it with you on the day of surgery. Shower using CHG 2 nights before surgery and the night before surgery.  If you shower the day of surgery use CHG.  Use special wash - you have one bottle of CHG for all showers.  You should use approximately 1/3 of the bottle for each shower.   Please read over the following fact sheets that you were given: Pain Booklet, Coughing and Deep Breathing, Blood Transfusion Information, Open Heart Packet, MRSA Information and Surgical Site Infection Prevention

## 2013-01-19 LAB — ABO/RH: ABO/RH(D): AB POS

## 2013-01-20 ENCOUNTER — Other Ambulatory Visit: Payer: Self-pay | Admitting: *Deleted

## 2013-01-20 ENCOUNTER — Encounter (HOSPITAL_COMMUNITY): Payer: Self-pay

## 2013-01-20 ENCOUNTER — Ambulatory Visit: Payer: Medicare Other | Admitting: Physical Therapy

## 2013-01-20 DIAGNOSIS — B958 Unspecified staphylococcus as the cause of diseases classified elsewhere: Secondary | ICD-10-CM

## 2013-01-20 MED ORDER — SODIUM CHLORIDE 0.9 % IV SOLN
INTRAVENOUS | Status: AC
  Administered 2013-01-21: 1.4 [IU]/h via INTRAVENOUS
  Filled 2013-01-20: qty 1

## 2013-01-20 MED ORDER — SODIUM CHLORIDE 0.9 % IV SOLN
INTRAVENOUS | Status: DC
  Filled 2013-01-20: qty 1000

## 2013-01-20 MED ORDER — DOPAMINE-DEXTROSE 3.2-5 MG/ML-% IV SOLN
2.0000 ug/kg/min | INTRAVENOUS | Status: DC
  Filled 2013-01-20: qty 250

## 2013-01-20 MED ORDER — EPINEPHRINE HCL 1 MG/ML IJ SOLN
0.5000 ug/min | INTRAVENOUS | Status: DC
  Filled 2013-01-20: qty 4

## 2013-01-20 MED ORDER — PHENYLEPHRINE HCL 10 MG/ML IJ SOLN
30.0000 ug/min | INTRAVENOUS | Status: AC
  Administered 2013-01-21: 20 ug/min via INTRAVENOUS
  Filled 2013-01-20: qty 2

## 2013-01-20 MED ORDER — MUPIROCIN 2 % EX OINT: TOPICAL_OINTMENT | Freq: Two times a day (BID) | CUTANEOUS | Status: DC

## 2013-01-20 MED ORDER — POTASSIUM CHLORIDE 2 MEQ/ML IV SOLN
80.0000 meq | INTRAVENOUS | Status: DC
  Filled 2013-01-20: qty 40

## 2013-01-20 MED ORDER — NITROGLYCERIN IN D5W 200-5 MCG/ML-% IV SOLN
2.0000 ug/min | INTRAVENOUS | Status: DC
  Filled 2013-01-20: qty 250

## 2013-01-20 MED ORDER — SODIUM CHLORIDE 0.9 % IV SOLN
INTRAVENOUS | Status: DC
  Filled 2013-01-20: qty 30

## 2013-01-20 MED ORDER — DEXMEDETOMIDINE HCL IN NACL 400 MCG/100ML IV SOLN
0.1000 ug/kg/h | INTRAVENOUS | Status: AC
  Administered 2013-01-21: 0.2 ug/kg/h via INTRAVENOUS
  Filled 2013-01-20: qty 100

## 2013-01-20 MED ORDER — NOREPINEPHRINE BITARTRATE 1 MG/ML IJ SOLN
0.0000 ug/min | INTRAVENOUS | Status: DC
  Filled 2013-01-20: qty 8

## 2013-01-20 MED ORDER — VANCOMYCIN HCL 10 G IV SOLR
1250.0000 mg | INTRAVENOUS | Status: AC
  Administered 2013-01-21: 1250 mg via INTRAVENOUS
  Filled 2013-01-20: qty 1250

## 2013-01-20 MED ORDER — MAGNESIUM SULFATE 50 % IJ SOLN
40.0000 meq | INTRAMUSCULAR | Status: DC
  Filled 2013-01-20: qty 10

## 2013-01-20 MED ORDER — DEXTROSE 5 % IV SOLN
1.5000 g | INTRAVENOUS | Status: DC
  Filled 2013-01-20: qty 1.5

## 2013-01-20 NOTE — Progress Notes (Addendum)
Anesthesia Chart Review:  Patient is a 79 year old male scheduled for TAVR ("via direct aortic approach through a right anterior mini thoracotomy") on 01/21/13 by Dr. Cornelius Moras.  History includes severe AS, CHF, former smoker, 3.7 cm AAA by CT 10/2011, HTN, COPD, GERD, depression, ETOH abuse, severe kyphoscoliosis. PCP is Dr. Ludwig Clarks.  Cardiologist is Dr. Jens Som.  HE CAN NOT HAVE NECK LINES ON THE RIGHT FOR SURGERY.  Echo on 10/15/12 showed: - Left ventricle: The cavity size was mildly dilated. Wall thickness was increased in a pattern of severe LVH. Systolic function was normal. The estimated ejection fraction was in the range of 55% to 60%. - Aortic valve: There was severe stenosis. Trivial regurgitation. Mean gradient: 78mm Hg (S). Peak gradient: Hg (S). - Mitral valve: Calcified annulus. - Left atrium: The atrium was mildly dilated. - Atrial septum: No defect or patent foramen ovale was identified.  Cardiac cath on 10/24/11 showed: 1. Severe AS by multiple echoes, with AI by angio  2. No significant coronary obstruction  3. Unable to get to RA from the femoral approach due to severe scoliosis   Cardiac/TAVR CTA on 12/25/12 showed: 1) Calcified tri-leaflet AV with VBA area of 552 mm2. Suitable for a soft 29mm Sapien XT valve  2) Marked Scoliosis with tortuous thoracic aorta making transfemoral approach with a large valve seem unlikely to be successful Trans aortic approach may be best with care to avoid the RIMA  3) No critical CAD with ostial origins sufficiently high to place stented valve  CXR on 01/17/13 showed: Marked scoliosis distorts the mediastinal and cardiac silhouette. Aortic valve calcification. Tortuous aorta. Please see recent CT reports. No infiltrate, congestive heart failure or pneumothorax.   Pulmonary Function Tests (10/30/2011)  Baseline Post-bronchodilator  FVC 2.12 L (56% predicted) FVC 2.06 L (55% predicted)  FEV1 1.46 L (55% predicted) FEV1 1.56 L (59% predicted)   FEF25-75 0.93 L (53% predicted) FEF25-75 1.15 L (66% predicted)  RV 2.99 L (112% predicted)  DLCO 46% predicted  Interpretation c/w moderate-severe COPD  (Updated PFTs from 01/17/13 showed FVC 2.26 (61%), FEV1 1.43 (55%), DLCOunc 11.56 (37%).  Preoperative EKG and labs noted.  He will be evaluated by his assigned anesthesiologist on the day of surgery to discuss the definitive anesthesia plan.  Velna Ochs [facility] Short Stay Center/Anesthesiology Phone [phone] 01/20/2013 10:18 AM

## 2013-01-21 ENCOUNTER — Inpatient Hospital Stay (HOSPITAL_COMMUNITY): Payer: Medicare Other

## 2013-01-21 ENCOUNTER — Encounter (HOSPITAL_COMMUNITY): Payer: Medicare Other | Admitting: Vascular Surgery

## 2013-01-21 ENCOUNTER — Inpatient Hospital Stay (HOSPITAL_COMMUNITY): Payer: Medicare Other | Admitting: Certified Registered"

## 2013-01-21 ENCOUNTER — Inpatient Hospital Stay (HOSPITAL_COMMUNITY)
Admission: RE | Admit: 2013-01-21 | Discharge: 2013-01-24 | DRG: 267 | Disposition: A | Discharge status: Skilled Nursing Facility | Payer: Medicare Other | Source: Ambulatory Visit | Attending: Thoracic Surgery (Cardiothoracic Vascular Surgery) | Admitting: Thoracic Surgery (Cardiothoracic Vascular Surgery)

## 2013-01-21 ENCOUNTER — Encounter (HOSPITAL_COMMUNITY): Payer: Self-pay | Admitting: *Deleted

## 2013-01-21 ENCOUNTER — Encounter (HOSPITAL_COMMUNITY)
Admission: RE | Disposition: A | Discharge status: Skilled Nursing Facility | Payer: Medicare Other | Source: Ambulatory Visit | Attending: Thoracic Surgery (Cardiothoracic Vascular Surgery)

## 2013-01-21 DIAGNOSIS — R5381 Other malaise: Secondary | ICD-10-CM | POA: Diagnosis present

## 2013-01-21 DIAGNOSIS — I714 Abdominal aortic aneurysm, without rupture, unspecified: Secondary | ICD-10-CM | POA: Diagnosis present

## 2013-01-21 DIAGNOSIS — I359 Nonrheumatic aortic valve disorder, unspecified: Secondary | ICD-10-CM

## 2013-01-21 DIAGNOSIS — M4120 Other idiopathic scoliosis, site unspecified: Secondary | ICD-10-CM | POA: Diagnosis present

## 2013-01-21 DIAGNOSIS — D62 Acute posthemorrhagic anemia: Secondary | ICD-10-CM | POA: Diagnosis not present

## 2013-01-21 DIAGNOSIS — R42 Dizziness and giddiness: Secondary | ICD-10-CM | POA: Diagnosis present

## 2013-01-21 DIAGNOSIS — I5042 Chronic combined systolic (congestive) and diastolic (congestive) heart failure: Secondary | ICD-10-CM | POA: Diagnosis present

## 2013-01-21 DIAGNOSIS — R0789 Other chest pain: Secondary | ICD-10-CM | POA: Diagnosis present

## 2013-01-21 DIAGNOSIS — M545 Low back pain, unspecified: Secondary | ICD-10-CM | POA: Diagnosis present

## 2013-01-21 DIAGNOSIS — I519 Heart disease, unspecified: Secondary | ICD-10-CM | POA: Diagnosis not present

## 2013-01-21 DIAGNOSIS — J449 Chronic obstructive pulmonary disease, unspecified: Secondary | ICD-10-CM | POA: Diagnosis present

## 2013-01-21 DIAGNOSIS — I509 Heart failure, unspecified: Secondary | ICD-10-CM | POA: Diagnosis present

## 2013-01-21 DIAGNOSIS — K5730 Diverticulosis of large intestine without perforation or abscess without bleeding: Secondary | ICD-10-CM | POA: Diagnosis present

## 2013-01-21 DIAGNOSIS — J9819 Other pulmonary collapse: Secondary | ICD-10-CM | POA: Diagnosis not present

## 2013-01-21 DIAGNOSIS — R1310 Dysphagia, unspecified: Secondary | ICD-10-CM | POA: Diagnosis present

## 2013-01-21 DIAGNOSIS — Z006 Encounter for examination for normal comparison and control in clinical research program: Secondary | ICD-10-CM

## 2013-01-21 DIAGNOSIS — G8929 Other chronic pain: Secondary | ICD-10-CM | POA: Diagnosis present

## 2013-01-21 DIAGNOSIS — Z87891 Personal history of nicotine dependence: Secondary | ICD-10-CM

## 2013-01-21 DIAGNOSIS — F329 Major depressive disorder, single episode, unspecified: Secondary | ICD-10-CM | POA: Diagnosis present

## 2013-01-21 DIAGNOSIS — I10 Essential (primary) hypertension: Secondary | ICD-10-CM | POA: Diagnosis present

## 2013-01-21 DIAGNOSIS — K219 Gastro-esophageal reflux disease without esophagitis: Secondary | ICD-10-CM | POA: Diagnosis present

## 2013-01-21 DIAGNOSIS — I498 Other specified cardiac arrhythmias: Secondary | ICD-10-CM | POA: Diagnosis not present

## 2013-01-21 DIAGNOSIS — F1020 Alcohol dependence, uncomplicated: Secondary | ICD-10-CM | POA: Diagnosis present

## 2013-01-21 DIAGNOSIS — D6959 Other secondary thrombocytopenia: Secondary | ICD-10-CM | POA: Diagnosis not present

## 2013-01-21 DIAGNOSIS — F3289 Other specified depressive episodes: Secondary | ICD-10-CM | POA: Diagnosis present

## 2013-01-21 DIAGNOSIS — Z952 Presence of prosthetic heart valve: Secondary | ICD-10-CM

## 2013-01-21 DIAGNOSIS — J4489 Other specified chronic obstructive pulmonary disease: Secondary | ICD-10-CM | POA: Diagnosis present

## 2013-01-21 DIAGNOSIS — J988 Other specified respiratory disorders: Secondary | ICD-10-CM | POA: Diagnosis not present

## 2013-01-21 DIAGNOSIS — R1319 Other dysphagia: Secondary | ICD-10-CM | POA: Diagnosis present

## 2013-01-21 HISTORY — DX: Presence of prosthetic heart valve: Z95.2

## 2013-01-21 HISTORY — PX: TRANSCATHETER AORTIC VALVE REPLACEMENT, TRANSAORTIC: SHX6402

## 2013-01-21 HISTORY — PX: INTRAOPERATIVE TRANSESOPHAGEAL ECHOCARDIOGRAM: SHX5062

## 2013-01-21 HISTORY — PX: THORACOTOMY: SHX5074

## 2013-01-21 LAB — POCT I-STAT 4, (NA,K, GLUC, HGB,HCT)
Glucose, Bld: 82 mg/dL (ref 70–99)
Glucose, Bld: 103 mg/dL — ABNORMAL HIGH (ref 70–99)
Glucose, Bld: 79 mg/dL (ref 70–99)
HCT: 37 % — ABNORMAL LOW (ref 39.0–52.0)
HCT: 31 % — ABNORMAL LOW (ref 39.0–52.0)
HCT: 34 % — ABNORMAL LOW (ref 39.0–52.0)
HCT: 33 % — ABNORMAL LOW (ref 39.0–52.0)
Hemoglobin: 12.6 g/dL — ABNORMAL LOW (ref 13.0–17.0)
Hemoglobin: 10.5 g/dL — ABNORMAL LOW (ref 13.0–17.0)
Hemoglobin: 11.2 g/dL — ABNORMAL LOW (ref 13.0–17.0)
Sodium: 138 mEq/L (ref 135–145)
Sodium: 139 mEq/L (ref 135–145)

## 2013-01-21 LAB — GLUCOSE, CAPILLARY
Glucose-Capillary: 48 mg/dL — ABNORMAL LOW (ref 70–99)
Glucose-Capillary: 49 mg/dL — ABNORMAL LOW (ref 70–99)
Glucose-Capillary: 63 mg/dL — ABNORMAL LOW (ref 70–99)

## 2013-01-21 LAB — POCT I-STAT 3, ART BLOOD GAS (G3+)
Bicarbonate: 25.4 mEq/L — ABNORMAL HIGH (ref 20.0–24.0)
O2 Saturation: 100 %
O2 Saturation: 97 %
Patient temperature: 35.3
TCO2: 30 mmol/L (ref 0–100)
TCO2: 26 mmol/L (ref 0–100)
pCO2 arterial: 53.9 mmHg — ABNORMAL HIGH (ref 35.0–45.0)
pCO2 arterial: 34.3 mmHg — ABNORMAL LOW (ref 35.0–45.0)
pCO2 arterial: 30.6 mmHg — ABNORMAL LOW (ref 35.0–45.0)
pH, Arterial: 7.329 — ABNORMAL LOW (ref 7.350–7.450)
pH, Arterial: 7.477 — ABNORMAL HIGH (ref 7.350–7.450)
pO2, Arterial: 366 mmHg — ABNORMAL HIGH (ref 80.0–100.0)
pO2, Arterial: 76 mmHg — ABNORMAL LOW (ref 80.0–100.0)

## 2013-01-21 LAB — CBC
HCT: 30.9 % — ABNORMAL LOW (ref 39.0–52.0)
MCH: 32.4 pg (ref 26.0–34.0)
MCHC: 35.3 g/dL (ref 30.0–36.0)
MCV: 92 fL (ref 78.0–100.0)
Platelets: 112 10*3/uL — ABNORMAL LOW (ref 150–400)
RDW: 13.4 % (ref 11.5–15.5)

## 2013-01-21 LAB — PROTIME-INR: INR: 1.39 (ref 0.00–1.49)

## 2013-01-21 SURGERY — IMPLANTATION, AORTIC VALVE, TRANSCATHETER, TRANSAORTIC APPROACH
Anesthesia: General | Site: Chest | Laterality: Right | Wound class: Clean

## 2013-01-21 MED ORDER — PHENYLEPHRINE HCL 10 MG/ML IJ SOLN
0.0000 ug/min | INTRAVENOUS | Status: DC
  Filled 2013-01-21: qty 2

## 2013-01-21 MED ORDER — VECURONIUM BROMIDE 10 MG IV SOLR
INTRAVENOUS | Status: DC | PRN
  Administered 2013-01-21: 3 mg via INTRAVENOUS
  Administered 2013-01-21: 5 mg via INTRAVENOUS

## 2013-01-21 MED ORDER — DEXTROSE 50 % IV SOLN
INTRAVENOUS | Status: AC
  Filled 2013-01-21: qty 50

## 2013-01-21 MED ORDER — MUPIROCIN 2 % EX OINT
TOPICAL_OINTMENT | Freq: Two times a day (BID) | CUTANEOUS | Status: DC
  Administered 2013-01-21: 23:00:00 via TOPICAL
  Administered 2013-01-22: 1 via TOPICAL
  Administered 2013-01-22 – 2013-01-23 (×3): via TOPICAL
  Administered 2013-01-24: 1 via TOPICAL
  Filled 2013-01-21: qty 22

## 2013-01-21 MED ORDER — IODIXANOL 320 MG/ML IV SOLN
INTRAVENOUS | Status: DC | PRN
  Administered 2013-01-21: 150 mL via INTRAVENOUS

## 2013-01-21 MED ORDER — DEXMEDETOMIDINE HCL IN NACL 200 MCG/50ML IV SOLN: 0.1000 ug/kg/h | INTRAVENOUS | Status: DC

## 2013-01-21 MED ORDER — FENTANYL CITRATE 0.05 MG/ML IJ SOLN
INTRAMUSCULAR | Status: DC | PRN
  Administered 2013-01-21: 300 ug via INTRAVENOUS
  Administered 2013-01-21: 200 ug via INTRAVENOUS

## 2013-01-21 MED ORDER — MORPHINE SULFATE 2 MG/ML IJ SOLN
2.0000 mg | INTRAMUSCULAR | Status: DC | PRN
  Filled 2013-01-21: qty 1

## 2013-01-21 MED ORDER — ONDANSETRON HCL 4 MG/2ML IJ SOLN: 4.0000 mg | Freq: Four times a day (QID) | INTRAMUSCULAR | Status: DC | PRN

## 2013-01-21 MED ORDER — MIDAZOLAM HCL 5 MG/5ML IJ SOLN
INTRAMUSCULAR | Status: DC | PRN
  Administered 2013-01-21: 5 mg via INTRAVENOUS

## 2013-01-21 MED ORDER — CLOPIDOGREL BISULFATE 75 MG PO TABS
75.0000 mg | ORAL_TABLET | Freq: Every day | ORAL | Status: DC
  Administered 2013-01-22 – 2013-01-24 (×3): 75 mg via ORAL
  Filled 2013-01-21 (×4): qty 1

## 2013-01-21 MED ORDER — SODIUM CHLORIDE 0.9 % IR SOLN
Status: DC | PRN
  Administered 2013-01-21: 13:00:00

## 2013-01-21 MED ORDER — ARTIFICIAL TEARS OP OINT
TOPICAL_OINTMENT | OPHTHALMIC | Status: DC | PRN
  Administered 2013-01-21: 1 via OPHTHALMIC

## 2013-01-21 MED ORDER — POTASSIUM CHLORIDE 10 MEQ/50ML IV SOLN
10.0000 meq | INTRAVENOUS | Status: AC
  Administered 2013-01-21 (×3): 10 meq via INTRAVENOUS

## 2013-01-21 MED ORDER — MUPIROCIN CALCIUM 2 % EX CREA
TOPICAL_CREAM | Freq: Two times a day (BID) | CUTANEOUS | Status: DC
  Administered 2013-01-21: 1 via TOPICAL
  Filled 2013-01-21: qty 15

## 2013-01-21 MED ORDER — POTASSIUM CHLORIDE 10 MEQ/50ML IV SOLN
10.0000 meq | Freq: Once | INTRAVENOUS | Status: AC
  Administered 2013-01-21: 10 meq via INTRAVENOUS

## 2013-01-21 MED ORDER — LACTATED RINGERS IV SOLN: 500.0000 mL | Freq: Once | INTRAVENOUS | Status: AC | PRN

## 2013-01-21 MED ORDER — ACETAMINOPHEN 160 MG/5ML PO SOLN: 1000.0000 mg | Freq: Four times a day (QID) | ORAL | Status: DC

## 2013-01-21 MED ORDER — MORPHINE SULFATE 2 MG/ML IJ SOLN
1.0000 mg | INTRAMUSCULAR | Status: AC | PRN
  Filled 2013-01-21: qty 1

## 2013-01-21 MED ORDER — SODIUM CHLORIDE 0.9 % IV SOLN
INTRAVENOUS | Status: DC
  Filled 2013-01-21: qty 1

## 2013-01-21 MED ORDER — SODIUM CHLORIDE 0.9 % IJ SOLN
INTRAMUSCULAR | Status: DC | PRN
  Administered 2013-01-21: 10 mL via INTRAVENOUS

## 2013-01-21 MED ORDER — METOPROLOL TARTRATE 25 MG/10 ML ORAL SUSPENSION
12.5000 mg | Freq: Two times a day (BID) | ORAL | Status: DC
  Filled 2013-01-21 (×3): qty 5

## 2013-01-21 MED ORDER — METOPROLOL TARTRATE 1 MG/ML IV SOLN: 2.5000 mg | INTRAVENOUS | Status: DC | PRN

## 2013-01-21 MED ORDER — OXYCODONE HCL 5 MG PO TABS
5.0000 mg | ORAL_TABLET | ORAL | Status: DC | PRN
  Administered 2013-01-22 – 2013-01-23 (×3): 5 mg via ORAL
  Administered 2013-01-23 – 2013-01-24 (×3): 10 mg via ORAL
  Filled 2013-01-21: qty 1
  Filled 2013-01-21 (×2): qty 2
  Filled 2013-01-21: qty 1
  Filled 2013-01-21: qty 2
  Filled 2013-01-21: qty 1

## 2013-01-21 MED ORDER — ROCURONIUM BROMIDE 100 MG/10ML IV SOLN
INTRAVENOUS | Status: DC | PRN
  Administered 2013-01-21 (×2): 50 mg via INTRAVENOUS

## 2013-01-21 MED ORDER — MAGNESIUM SULFATE 40 MG/ML IJ SOLN
4.0000 g | Freq: Once | INTRAMUSCULAR | Status: AC
  Administered 2013-01-21: 4 g via INTRAVENOUS
  Filled 2013-01-21: qty 100

## 2013-01-21 MED ORDER — SODIUM CHLORIDE 0.9 % IV SOLN: INTRAVENOUS | Status: DC

## 2013-01-21 MED ORDER — PANTOPRAZOLE SODIUM 40 MG PO TBEC: 40.0000 mg | DELAYED_RELEASE_TABLET | Freq: Every day | ORAL | Status: DC

## 2013-01-21 MED ORDER — 0.9 % SODIUM CHLORIDE (POUR BTL) OPTIME
TOPICAL | Status: DC | PRN
  Administered 2013-01-21: 7000 mL

## 2013-01-21 MED ORDER — CHLORHEXIDINE GLUCONATE 4 % EX LIQD: 30.0000 mL | CUTANEOUS | Status: DC

## 2013-01-21 MED ORDER — MUPIROCIN 2 % EX OINT
TOPICAL_OINTMENT | CUTANEOUS | Status: AC
  Administered 2013-01-22: 1 via TOPICAL
  Filled 2013-01-21: qty 22

## 2013-01-21 MED ORDER — METOPROLOL TARTRATE 12.5 MG HALF TABLET
ORAL_TABLET | ORAL | Status: AC
  Filled 2013-01-21: qty 1

## 2013-01-21 MED ORDER — PROPOFOL 10 MG/ML IV BOLUS
INTRAVENOUS | Status: DC | PRN
  Administered 2013-01-21: 40 mg via INTRAVENOUS

## 2013-01-21 MED ORDER — FENTANYL CITRATE 0.05 MG/ML IJ SOLN: 100.0000 ug | Freq: Once | INTRAMUSCULAR | Status: DC

## 2013-01-21 MED ORDER — ASPIRIN 81 MG PO CHEW: 324.0000 mg | CHEWABLE_TABLET | Freq: Every day | ORAL | Status: DC

## 2013-01-21 MED ORDER — METOPROLOL TARTRATE 12.5 MG HALF TABLET
12.5000 mg | ORAL_TABLET | Freq: Once | ORAL | Status: AC
  Administered 2013-01-21: 12.5 mg via ORAL

## 2013-01-21 MED ORDER — ACETAMINOPHEN 500 MG PO TABS
1000.0000 mg | ORAL_TABLET | Freq: Four times a day (QID) | ORAL | Status: DC
  Administered 2013-01-22 (×4): 1000 mg via ORAL
  Administered 2013-01-22: 500 mg via ORAL
  Administered 2013-01-23 – 2013-01-24 (×3): 1000 mg via ORAL
  Filled 2013-01-21 (×13): qty 2

## 2013-01-21 MED ORDER — DEXTROSE 5 % IV SOLN
1.5000 g | Freq: Two times a day (BID) | INTRAVENOUS | Status: AC
  Administered 2013-01-21 – 2013-01-23 (×4): 1.5 g via INTRAVENOUS
  Filled 2013-01-21 (×4): qty 1.5

## 2013-01-21 MED ORDER — DEXTROSE 50 % IV SOLN
25.0000 mL | Freq: Once | INTRAVENOUS | Status: AC
  Administered 2013-01-21: 25 mL via INTRAVENOUS

## 2013-01-21 MED ORDER — ALBUMIN HUMAN 5 % IV SOLN
250.0000 mL | INTRAVENOUS | Status: AC | PRN
  Administered 2013-01-21: 250 mL via INTRAVENOUS

## 2013-01-21 MED ORDER — ACETAMINOPHEN 160 MG/5ML PO SOLN: 650.0000 mg | Freq: Once | ORAL | Status: AC

## 2013-01-21 MED ORDER — SODIUM CHLORIDE 0.9 % IV SOLN
1.0000 mL/kg/h | INTRAVENOUS | Status: AC
  Administered 2013-01-21: 1 mL/kg/h via INTRAVENOUS

## 2013-01-21 MED ORDER — ACETAMINOPHEN 650 MG RE SUPP
650.0000 mg | Freq: Once | RECTAL | Status: AC
  Administered 2013-01-21: 650 mg via RECTAL

## 2013-01-21 MED ORDER — PROTAMINE SULFATE 10 MG/ML IV SOLN
INTRAVENOUS | Status: DC | PRN
  Administered 2013-01-21: 25 mg via INTRAVENOUS
  Administered 2013-01-21: 55 mg via INTRAVENOUS

## 2013-01-21 MED ORDER — LACTATED RINGERS IV SOLN
INTRAVENOUS | Status: DC
  Administered 2013-01-21 (×5): via INTRAVENOUS

## 2013-01-21 MED ORDER — FAMOTIDINE IN NACL 20-0.9 MG/50ML-% IV SOLN
20.0000 mg | Freq: Two times a day (BID) | INTRAVENOUS | Status: AC
  Administered 2013-01-21 – 2013-01-22 (×2): 20 mg via INTRAVENOUS
  Filled 2013-01-21 (×2): qty 50

## 2013-01-21 MED ORDER — METOPROLOL TARTRATE 12.5 MG HALF TABLET
12.5000 mg | ORAL_TABLET | Freq: Two times a day (BID) | ORAL | Status: DC
  Administered 2013-01-21: 12.5 mg via ORAL
  Filled 2013-01-21 (×3): qty 1

## 2013-01-21 MED ORDER — VANCOMYCIN HCL IN DEXTROSE 1-5 GM/200ML-% IV SOLN
1000.0000 mg | Freq: Once | INTRAVENOUS | Status: AC
  Administered 2013-01-22: 1000 mg via INTRAVENOUS
  Filled 2013-01-21: qty 200

## 2013-01-21 MED ORDER — MIDAZOLAM HCL 2 MG/2ML IJ SOLN: 2.0000 mg | INTRAMUSCULAR | Status: DC | PRN

## 2013-01-21 MED ORDER — MIDAZOLAM HCL 2 MG/2ML IJ SOLN
INTRAMUSCULAR | Status: AC
  Filled 2013-01-21: qty 2

## 2013-01-21 MED ORDER — FENTANYL CITRATE 0.05 MG/ML IJ SOLN
INTRAMUSCULAR | Status: AC
  Filled 2013-01-21: qty 2

## 2013-01-21 MED ORDER — HEPARIN SODIUM (PORCINE) 1000 UNIT/ML IJ SOLN
INTRAMUSCULAR | Status: DC | PRN
  Administered 2013-01-21: 8000 [IU] via INTRAVENOUS

## 2013-01-21 MED ORDER — INSULIN REGULAR BOLUS VIA INFUSION
0.0000 [IU] | Freq: Three times a day (TID) | INTRAVENOUS | Status: DC
  Filled 2013-01-21: qty 10

## 2013-01-21 MED ORDER — MIDAZOLAM HCL 5 MG/ML IJ SOLN: 2.0000 mg | Freq: Once | INTRAMUSCULAR | Status: DC

## 2013-01-21 MED ORDER — ALBUMIN HUMAN 5 % IV SOLN
INTRAVENOUS | Status: DC | PRN
  Administered 2013-01-21 (×2): via INTRAVENOUS

## 2013-01-21 MED ORDER — NITROGLYCERIN IN D5W 200-5 MCG/ML-% IV SOLN
0.0000 ug/min | INTRAVENOUS | Status: DC
  Filled 2013-01-21: qty 250

## 2013-01-21 MED ORDER — EPINEPHRINE HCL 1 MG/ML IJ SOLN
INTRAMUSCULAR | Status: DC | PRN
  Administered 2013-01-21: 1 mg via INTRAVENOUS

## 2013-01-21 MED ORDER — ASPIRIN EC 325 MG PO TBEC
325.0000 mg | DELAYED_RELEASE_TABLET | Freq: Every day | ORAL | Status: DC
  Filled 2013-01-21: qty 1

## 2013-01-21 MED ORDER — INSULIN ASPART 100 UNIT/ML ~~LOC~~ SOLN
0.0000 [IU] | SUBCUTANEOUS | Status: DC
  Administered 2013-01-21 – 2013-01-22 (×3): 2 [IU] via SUBCUTANEOUS
  Administered 2013-01-22: 4 [IU] via SUBCUTANEOUS

## 2013-01-21 SURGICAL SUPPLY — 108 items
ADH SKN CLS APL DERMABOND .7 (GAUZE/BANDAGES/DRESSINGS) ×2
APL SKNCLS STERI-STRIP NONHPOA (GAUZE/BANDAGES/DRESSINGS) ×4
BAG SNAP BAND KOVER 36X36 (MISCELLANEOUS) ×4 IMPLANT
BATTERY PACK STR FOR DRIVER (MISCELLANEOUS) ×1 IMPLANT
BENZOIN TINCTURE PRP APPL 2/3 (GAUZE/BANDAGES/DRESSINGS) ×2 IMPLANT
BLADE OSCILLATING /SAGITTAL (BLADE) ×3 IMPLANT
BLADE STERNUM SYSTEM 6 (BLADE) ×3 IMPLANT
CABLE PACING FASLOC BIEGE (MISCELLANEOUS) ×3 IMPLANT
CANISTER SUCTION 2500CC (MISCELLANEOUS) ×3 IMPLANT
CANNULA FEM VENOUS REMOTE 22FR (CANNULA) IMPLANT
CANNULA OPTISITE PERFUSION 16F (CANNULA) IMPLANT
CANNULA OPTISITE PERFUSION 18F (CANNULA) IMPLANT
CARDIAC SUCTION (MISCELLANEOUS) IMPLANT
CATH BEACON 5.038 65CM KMP-01 (CATHETERS) ×1 IMPLANT
CATH DIAG EXPO 6F VENT PIG 145 (CATHETERS) ×1 IMPLANT
CATH EXPO 5FR FR4 (CATHETERS) ×1 IMPLANT
CATH S G BIP PACING (SET/KITS/TRAYS/PACK) ×1 IMPLANT
CLIP LIGATING EXTRA MED SLVR (CLIP) ×1 IMPLANT
CLIP TI MEDIUM 6 (CLIP) ×3 IMPLANT
CLIP TI WIDE RED SMALL 6 (CLIP) ×3 IMPLANT
CONN ST 1/4X3/8  BEN (MISCELLANEOUS) ×1
CONN ST 1/4X3/8 BEN (MISCELLANEOUS) ×2 IMPLANT
COVER BACK TABLE 24X17X13 BIG (DRAPES) ×3 IMPLANT
COVER DOME SNAP 22 D (MISCELLANEOUS) ×3 IMPLANT
COVER SURGICAL LIGHT HANDLE (MISCELLANEOUS) ×3 IMPLANT
CRADLE DONUT ADULT HEAD (MISCELLANEOUS) ×3 IMPLANT
DERMABOND ADVANCED (GAUZE/BANDAGES/DRESSINGS) ×1
DERMABOND ADVANCED .7 DNX12 (GAUZE/BANDAGES/DRESSINGS) ×2 IMPLANT
DRAIN CHANNEL 28F RND 3/8 FF (WOUND CARE) ×3 IMPLANT
DRAPE BILATERAL SPLIT (DRAPES) ×1 IMPLANT
DRAPE SLUSH/WARMER DISC (DRAPES) ×3 IMPLANT
DRAPE TABLE COVER HEAVY DUTY (DRAPES) ×3 IMPLANT
DRILL BIT 2.2MM W/14M STOP (BIT) ×1 IMPLANT
ELECT BLADE 4.0 EZ CLEAN MEGAD (MISCELLANEOUS) ×3
ELECT REM PT RETURN 9FT ADLT (ELECTROSURGICAL) ×6
ELECTRODE BLDE 4.0 EZ CLN MEGD (MISCELLANEOUS) ×2 IMPLANT
ELECTRODE REM PT RTRN 9FT ADLT (ELECTROSURGICAL) ×4 IMPLANT
FELT TEFLON 4X4 (MISCELLANEOUS) ×3 IMPLANT
FEMORAL VENOUS CANN RAP (CANNULA) IMPLANT
FILTER STRAW FLUID ASPIR (MISCELLANEOUS) ×3 IMPLANT
GLOVE ECLIPSE 7.5 STRL STRAW (GLOVE) ×3 IMPLANT
GLOVE ECLIPSE 8.0 STRL XLNG CF (GLOVE) ×3 IMPLANT
GLOVE EUDERMIC 7 POWDERFREE (GLOVE) ×3 IMPLANT
GLOVE ORTHO TXT STRL SZ7.5 (GLOVE) ×3 IMPLANT
GOWN PREVENTION PLUS XLARGE (GOWN DISPOSABLE) ×12 IMPLANT
GOWN STRL NON-REIN LRG LVL3 (GOWN DISPOSABLE) ×12 IMPLANT
GUIDEWIRE SAF TJ AMPL .035X180 (WIRE) ×1 IMPLANT
GUIDEWIRE STRAIGHT .035 260CM (WIRE) ×1 IMPLANT
GUIDEWIRE WHOLEY .035 145 JTIP (WIRE) ×1 IMPLANT
KIT BASIN OR (CUSTOM PROCEDURE TRAY) ×3 IMPLANT
KIT DILATOR VASC 18G NDL (KITS) ×1 IMPLANT
KIT HEART LEFT (KITS) ×1 IMPLANT
KIT ROOM TURNOVER OR (KITS) ×3 IMPLANT
KIT SUCTION CATH 14FR (SUCTIONS) ×9 IMPLANT
LEAD PACING MYOCARDI (MISCELLANEOUS) ×3 IMPLANT
NDL PERC 18GX7CM (NEEDLE) ×4 IMPLANT
NEEDLE 22X1 1/2 (OR ONLY) (NEEDLE) ×3 IMPLANT
NEEDLE PERC 18GX7CM (NEEDLE) ×6 IMPLANT
NS IRRIG 1000ML POUR BTL (IV SOLUTION) ×15 IMPLANT
PACK AORTA (CUSTOM PROCEDURE TRAY) ×3 IMPLANT
PAD ARMBOARD 7.5X6 YLW CONV (MISCELLANEOUS) ×6 IMPLANT
PAD ELECT DEFIB RADIOL ZOLL (MISCELLANEOUS) ×3 IMPLANT
PLATE UNIVERSAL 8 HOLE (Plate) ×2 IMPLANT
RETRACTOR PVM SOFT TISSUE M (INSTRUMENTS) ×2 IMPLANT
SCREW SELF TAP MAT 2.9X14MM (Screw) ×8 IMPLANT
SHEATH PINNACLE 6F 10CM (SHEATH) ×5 IMPLANT
SOLUTION ANTI FOG 6CC (MISCELLANEOUS) ×1 IMPLANT
SPONGE GAUZE 4X4 12PLY (GAUZE/BANDAGES/DRESSINGS) ×5 IMPLANT
SPONGE LAP 4X18 X RAY DECT (DISPOSABLE) ×1 IMPLANT
SPONGE SURGIFOAM ABS GEL 100 (HEMOSTASIS) ×9 IMPLANT
SUT BONE WAX W31G (SUTURE) ×3 IMPLANT
SUT ETHIBOND 2 0 SH (SUTURE) ×3 IMPLANT
SUT ETHIBOND X763 2 0 SH 1 (SUTURE) ×6 IMPLANT
SUT MNCRL AB 3-0 PS2 18 (SUTURE) ×3 IMPLANT
SUT PDS AB 1 CTX 36 (SUTURE) IMPLANT
SUT PROLENE 3 0 SH1 36 (SUTURE) ×3 IMPLANT
SUT PROLENE 4 0 RB 1 (SUTURE) ×6
SUT PROLENE 4 0 SH DA (SUTURE) ×3 IMPLANT
SUT PROLENE 4-0 RB1 .5 CRCL 36 (SUTURE) ×4 IMPLANT
SUT PROLENE 5 0 C 1 36 (SUTURE) IMPLANT
SUT PROLENE 6 0 C 1 30 (SUTURE) IMPLANT
SUT SILK  1 MH (SUTURE) ×1
SUT SILK 1 MH (SUTURE) ×2 IMPLANT
SUT SILK 2 0 SH CR/8 (SUTURE) ×3 IMPLANT
SUT SILK 2 0SH CR/8 30 (SUTURE) ×3 IMPLANT
SUT SILK 3 0 SH CR/8 (SUTURE) IMPLANT
SUT STEEL 6MS V (SUTURE) IMPLANT
SUT STEEL STERNAL CCS#1 18IN (SUTURE) IMPLANT
SUT STEEL SZ 6 DBL 3X14 BALL (SUTURE) IMPLANT
SUT VIC AB 2-0 CTX 36 (SUTURE) ×3 IMPLANT
SUT VIC AB 3-0 SH 8-18 (SUTURE) IMPLANT
SYR 3ML LL SCALE MARK (SYRINGE) ×2 IMPLANT
SYR 50ML LL SCALE MARK (SYRINGE) ×1 IMPLANT
SYR MEDRAD MARK V 150ML (SYRINGE) ×3 IMPLANT
SYSTEM SAHARA CHEST DRAIN ATS (WOUND CARE) ×3 IMPLANT
TAPE CLOTH SOFT 2X10 (GAUZE/BANDAGES/DRESSINGS) ×3 IMPLANT
TAPE VIPERTRACK RADIOPAQ 30X (MISCELLANEOUS) IMPLANT
TAPE VIPERTRACK RADIOPAQUE (MISCELLANEOUS)
TOWEL OR 17X24 6PK STRL BLUE (TOWEL DISPOSABLE) ×9 IMPLANT
TOWEL OR 17X26 10 PK STRL BLUE (TOWEL DISPOSABLE) ×6 IMPLANT
TRANSDUCER W/STOPCOCK (MISCELLANEOUS) ×1 IMPLANT
TRAY FOLEY IC TEMP SENS 14FR (CATHETERS) ×3 IMPLANT
TUBING ART PRESS 72  MALE/FEM (TUBING) ×1
TUBING ART PRESS 72 MALE/FEM (TUBING) IMPLANT
UNDERPAD 30X30 INCONTINENT (UNDERPADS AND DIAPERS) ×3 IMPLANT
VALVE HRT TRANSCATH ASCEND 29M (Valve) ×1 IMPLANT
WATER STERILE IRR 1000ML POUR (IV SOLUTION) ×6 IMPLANT
WIRE .035 3MM-J 145CM (WIRE) ×1 IMPLANT

## 2013-01-21 NOTE — Anesthesia Postprocedure Evaluation (Signed)
  Anesthesia Post-op Note  Patient: Samuel [name]  Procedure(s) Performed: Procedure(s): TRANSCATHETER AORTIC VALVE REPLACEMENT, TRANSAORTIC (N/A) INTRAOPERATIVE TRANSESOPHAGEAL ECHOCARDIOGRAM (N/A) MINI/LIMITED THORACOTOMY (Right)  Patient Location: SICU  Anesthesia Type:General  Level of Consciousness: sedated and unresponsive  Airway and Oxygen Therapy: Patient remains intubated per anesthesia plan and Patient placed on Ventilator (see vital sign flow sheet for setting)  Post-op Pain: none  Post-op Assessment: Post-op Vital signs reviewed, Patient's Cardiovascular Status Stable and Respiratory Function Stable  Post-op Vital Signs: stable  Complications: No apparent anesthesia complications

## 2013-01-21 NOTE — Interval H&P Note (Signed)
History and Physical Interval Note:  01/21/2013 11:18 AM  Samuel [name]  has presented today for surgery, with the diagnosis of SEVERE AS  The various methods of treatment have been discussed with the patient and family. After consideration of risks, benefits and other options for treatment, the patient has consented to  Procedure(s) with comments: TRANSCATHETER AORTIC VALVE REPLACEMENT, TRANSAORTIC (N/A) - VIA RIGHT ANTERIOR MINI THORACOTOMY APPROACH  **NO NECK LINES ON RIGHT** INTRAOPERATIVE TRANSESOPHAGEAL ECHOCARDIOGRAM (N/A) as a surgical intervention .  The patient's history has been reviewed, patient examined, no change in status, stable for surgery.  I have reviewed the patient's chart and labs.  Questions were answered to the patient's satisfaction.     OWEN,CLARENCE H

## 2013-01-21 NOTE — Progress Notes (Signed)
CBG down to 49, dextrose 50% 25 cc IV given. Repeat CBG 48, dextrose 50% 25 cc IV given. Repeat CBG 114. Will continue to monitor

## 2013-01-21 NOTE — Op Note (Signed)
CARDIOTHORACIC SURGERY OPERATIVE NOTE  Date of Procedure:  01/21/2013  Preoperative Diagnosis: Severe Aortic Stenosis   Postoperative Diagnosis: Same   Procedure:    Transcatheter Aortic Valve Replacement - Transaortic Approach via Right Anterior Mini Thoracotomy  Edwards Sapien XT THV (size 29 mm, model # 9300TFX, serial # [id number])   Co-Surgeons:  Salvatore Decent. Cornelius Moras, MD and Tonny Bollman, MD  Assistants:   Alleen Borne, MD   Anesthesiologist:  Kipp Brood, MD  Echocardiographer:  Charlton Haws, MD  Pre-operative Echo Findings:  Severe aortic stenosis  Moderate aortic insufficiency  normal left ventricular systolic function   Post-operative Echo Findings:  mild paravalvular leak  normal left ventricular systolic function    BRIEF CLINICAL NOTE AND INDICATIONS FOR SURGERY  Patient is an 79 year old male who has been followed for several years by Dr. Jens Som with known history of severe symptomatic aortic stenosis and alcoholism. The patient has historically remained adamantly opposed to any considerations for elective aortic valve replacement. I first saw him in consultation in July of 2013 when he was hospitalized for congestive heart failure. At that time he continued to resist any thoughts regarding possible elective aortic valve replacement. The patient reports that over the last year he has gone downhill considerably. He gets short of breath with minimal physical activity and occasionally at rest, consistent with chronic combined systolic and diastolic CHF, functional class III . He reports that he is feeling weaker and weaker. He has frequent dizzy spells without syncope. He reports only occasional mild, transient chest discomfort. He denies PND, orthopnea, or lower extremity edema. As his breathing has deteriorated over the past few months he has reconsidered the possibility of considering elective surgical intervention. He was seen recently by Tereso Newcomer and a  followup echocardiogram was performed confirming further progression in the severity of aortic stenosis. Peak velocity across the aortic valve now measures 5.6 m/s corresponding to peak and mean transvalvular gradients estimated 127 and 78 mm mercury respectively. Left ventricular systolic function remains reasonably well preserved with ejection fraction estimated 55-60%. Diagnostic cardiac catheterization performed July of 2013 was notable for the absence of significant coronary artery disease. Right heart catheterization was not performed at that time.  The patient is widowed and divorced and lives alone. His daughter lives locally in [place]. He has a sister who lives in [place]. He reports that he remains functionally independent although he has clearly gone downhill. He admits that he has had a problem with alcohol in the past. He states that he still drinks some alcohol on a regular basis, but both he and his daughter report that his alcohol consumption has been less recently.  The patient was seen by Dr. Excell Seltzer who discussed options with him further on 11/08/2012. At that time the patient's biggest complaint was that of left hip pain which he states began after he had to lie on his left side when he underwent followup echocardiogram. Plain film x-rays were obtained which showed mild degenerative change in both hips, right more so than the left. Dr. Excell Seltzer did not feel that the patient needed repeat cardiac catheterization before proceeding with definitive treatment for his severe aortic stenosis. Because of severe tortuosity of the aorta and the presence of small infrarenal abdominal aortic aneurysm, the patient would not be candidate for transcatheter aortic valve replacement via transfemoral approach.    During the course of the patient's preoperative work up they have been evaluated comprehensively by a multidisciplinary team of specialists coordinated through the  Multidisciplinary Heart Valve  Clinic in the [facility].  They have been demonstrated to suffer from symptomatic severe aortic stenosis as noted above. The patient has been counseled extensively as to the relative risks and benefits of all options for the treatment of severe aortic stenosis including long term medical therapy, conventional surgery for aortic valve replacement, and transcatheter aortic valve replacement.  The patient has been independently evaluated by two cardiac surgeons including myself and Dr. Laneta Simmers, and both of [place] feel that the patient would be a poor candidate for conventional surgery (predicted risk of mortality >15% and/or predicted risk of permanent morbidity >50%) because of comorbidities.   Based upon review of all of the patient's preoperative diagnostic tests they are felt to be candidate for transcatheter aortic valve replacement using the transaortic approach as an alternative to high risk conventional surgery.    Following the decision to proceed with transcatheter aortic valve replacement, a discussion has been held regarding what types of management strategies would be attempted intraoperatively in the event of life-threatening complications, including whether or not the patient would be considered a candidate for the use of cardiopulmonary bypass and/or conversion to open sternotomy for attempted surgical intervention.  The patient has been advised of a variety of complications that might develop peculiar to this approach including but not limited to risks of death, stroke, paravalvular leak, aortic dissection or other major vascular complications, aortic annulus rupture, device embolization, cardiac rupture or perforation, acute myocardial infarction, arrhythmia, heart block or bradycardia requiring permanent pacemaker placement, congestive heart failure, respiratory failure, renal failure, pneumonia, infection, other late complications related to structural valve deterioration or  migration, or other complications that might ultimately cause a temporary or permanent loss of functional independence or other long term morbidity.  The patient provides full informed consent for the procedure as described and all questions were answered preoperatively.    DETAILS OF THE OPERATIVE PROCEDURE  PREPARATION:    The patient is brought to the operating room on the above mentioned date and central monitoring was established by the anesthesia team including placement of Swan-Ganz catheter and radial arterial line. The patient is placed in the supine position on the operating table.  Intravenous antibiotics are administered. General endotracheal anesthesia is induced uneventfully. A Foley catheter is placed.  Baseline transesophageal echocardiogram was performed.  Findings were notable for severe calcific aortic stenosis with moderate aortic insufficiency.  There was normal LV systolic function with LV hypertrophy and significant diastolic dysfunction.  There was trivial mitral regurgitation.  The patient's right neck, chest, abdomen, both groins, and both lower extremities are prepared and draped in a sterile manner. A time out procedure is performed.   PERIPHERAL ACCESS:    Bilateral femoral arterial and venous access is obtained with placement of 6 Fr arterial sheaths on both sides.  A pigtail diagnostic catheter is passed through the left femoral arterial sheath under fluoroscopic guidance into the aortic root.  A temporary transvenous pacemaker catheter is passed through the left femoral venous sheath under fluoroscopic guidance into the right ventricle.  The pacemaker is tested to ensure stable lead placement and pacemaker capture.   TRANSAORTIC ACCESS:   A right anterior mini thoracotomy incision is performed overlying the second intercostal space.  The second and third costal cartilages are divided medially to facilitate exposure.  The anterior surface of the ascending aorta  and aortic arch are exposed.  An appropriate site for cannulation is chosen and a double purse  string suture is placed using pledgeted 2-0 Ethibond suture.  The patient is heparinized systemically and ACT verified > 250 seconds.  The aorta is punctured using an 18 gauge needle and a soft J-tipped guidewire is passed into the aortic root.  A 6 Fr sheath is placed over the guidewire.  A MPA-1 catheter is passed through the sheath into the aorta.  The aortic valve is crossed using a soft straight tipped guidewire and the MPA-1 catheter advanced into the left ventricle.  Baseline simultaneous LV and aortic pressures are recorded.  An Amplatz extra stiff guidewire is passed through the MPA-1 catheter into the left ventricle and both the MPA-1 catheter and the introducing sheath are removed.  An Edwards Ascendra 3 introducer sheath is passed over the guidewire into the aorta.  The sheath position is continuously monitored and secured by the surgical assistant.     BALLOON AORTIC VALVULOPLASTY:  An Edwards Ascendra BAV catheter (model #9100 BAVC, 20mm x 3 cm balloon) is advanced over the guidewire and through the introducer sheath into the left ventricle and across the aortic valve.  The position of the BAV catheter is verified flouroscopically with approximately 50% of the balloon length above and below the plane of the aortic annulus.  Balloon aortic valvuloplasty is performed while temporarily holding ventilation and during rapid ventricular pacing to maintain systolic blood pressure < 50 mmHg and pulse pressure < 10 mmHg.  The balloon is removed and TEE reassessed for valve and left ventricular function.   The patient's hemodynamic recovery following BAV is rapid and uneventful.   TRANSCATHETER HEART VALVE DEPLOYMENT:  An Stephannie Peters XT transcatheter (size 29 mm, model # 9300TFX, serial [id number]) is prepared and crimped per manufacturer's guidelines, and the proper orientation of the valve is confirmed  on the [employer]  3 delivery system.  The delivery system loader is advanced into the introducing sheath.  The valve and delivery system are advanced through the loader into the sheath and all air is evacuated.  The valve and balloon are advanced part way across the aortic valve.  The valve pusher is retracted.  The valve is then finely positioned in the aortic valve and its position verified using TEE.  Once final position of the valve has been confirmed, the valve is deployed while temporarily holding ventilation and during rapid ventricular pacing to maintain systolic blood pressure < 50 mmHg and pulse pressure < 10 mmHg.  A root injection aortogram is performed at the beginning of the rapid pacing run to confirm appropriate valve position. The balloon inflation is held for >3 seconds after reaching full deployment volume.  Once the balloon has fully deflated the balloon is retracted into the left ventricle and valve function is assessed using TEE.   There is felt to be mild-moderate (2+) paravalvular leak and no central aortic insufficiency.  Left ventricular function is unchanged from preoperatively.  There is trivial mitral regurgitation.  The patient's hemodynamic recovery following valve deployment is rapid with significantly lower pulmonary artery pressures.  Pst-deployment dilatation is performed using the deployment balloon with 1 mL extra volume added to the balloon during rapid pacing.  Following post-deployment dilatation the degree of paravalvular leak noted by TEE was mild (1+).  The deployment balloon is removed and a J-tipped catheter advanced across the valve into the left ventricle.  The guidewire is removed and simultaneous left ventricular and aortic pressures are recorded.  A root injection is performed to examine the severity of aortic insufficiency, which  is felt to be mild (1+).   PROCEDURE COMPLETION:  The aortic sheath is removed during rapid ventricular pacing to  maintain systolic blood pressure < 70 mmHg while the pursestring sutures are tied.  The aortic closure is inspected and notably intact.  Protamine is administered.    Once hemostasis has been ascertained, the right pleural space is drained using a single 28 Fr Bard chest tube.  The second and third costal cartilages are reattached to the sternum using Synthes plates.   The mini thoracotomy incision is closed in layers and the skin incision closed using a subcuticular skin closure.   The temporary pacemaker, pigtail catheters and all femoral sheaths are removed.  The patient tolerated the procedure well and is transported to the surgical intensive care in stable condition. There are no intraoperative complications. All sponge instrument and needle counts are verified correct at completion of the operation.  No blood products were administered during the operation.  The patient received a total of 61 mL of intravenous contrast during the procedure.    Salvatore Decent. Cornelius Moras MD 01/21/2013 4:04 PM

## 2013-01-21 NOTE — Anesthesia Preprocedure Evaluation (Addendum)
Anesthesia Evaluation  Patient identified by MRN, date of birth, ID band Patient awake    Reviewed: Allergy & Precautions, H&P , NPO status , Patient's Chart, lab work & pertinent test results  Airway Mallampati: II TM Distance: >3 FB Neck ROM: Full    Dental  (+) Edentulous Upper and Edentulous Lower   Pulmonary shortness of breath, COPD breath sounds clear to auscultation        Cardiovascular hypertension, Pt. on medications + Peripheral Vascular Disease and +CHF + Valvular Problems/Murmurs AS Rhythm:Regular Rate:Normal + Systolic murmurs    Neuro/Psych  Headaches, Depression    GI/Hepatic GERD-  Medicated and Controlled,  Endo/Other    Renal/GU      Musculoskeletal   Abdominal   Peds  Hematology   Anesthesia Other Findings   Reproductive/Obstetrics                          Anesthesia Physical Anesthesia Plan  ASA: IV  Anesthesia Plan: General   Post-op Pain Management:    Induction: Intravenous  Airway Management Planned: Double Lumen EBT  Additional Equipment: Arterial line, CVP, PA Cath and 3D TEE  Intra-op Plan:   Post-operative Plan: Post-operative intubation/ventilation  Informed Consent: I have reviewed the patients History and Physical, chart, labs and discussed the procedure including the risks, benefits and alternatives for the proposed anesthesia with the patient or authorized representative who has indicated his/her understanding and acceptance.     Plan Discussed with: CRNA and Anesthesiologist  Anesthesia Plan Comments: (Critical symptomatic aortic stenosis Severe LVH normal EF Severe kyphoscoliosis H/O ETOH abuse GERD Hypertension 3.7 Cm. Infrarenal AAA  Kipp Brood, MD)       Anesthesia Quick Evaluation

## 2013-01-21 NOTE — Anesthesia Procedure Notes (Addendum)
Procedure Name: Intubation Date/Time: 01/21/2013 1:03 PM Performed by: Jefm Miles E Pre-anesthesia Checklist: Patient identified, Emergency Drugs available, Suction available, Patient being monitored and Timeout performed Patient Re-evaluated:Patient Re-evaluated prior to inductionOxygen Delivery Method: Circle system utilized Preoxygenation: Pre-oxygenation with 100% oxygen Intubation Type: IV induction Ventilation: Mask ventilation without difficulty and Oral airway inserted - appropriate to patient size Laryngoscope Size: Mac and 3 Grade View: Grade I Tube type: Oral Endobronchial tube: Double lumen EBT and Left and 35 Fr Number of attempts: 1 Airway Equipment and Method: Stylet and Fiberoptic brochoscope Placement Confirmation: ETT inserted through vocal cords under direct vision,  positive ETCO2 and breath sounds checked- equal and bilateral Secured at: 29 cm Tube secured with: Tape Dental Injury: Teeth and Oropharynx as per pre-operative assessment    Procedure Name: Intubation Date/Time: 01/21/2013 3:40 PM Performed by: Jefm Miles E Oxygen Delivery Method: Circle system utilized Laryngoscope Size: Mac and 3 Grade View: Grade I Tube type: Oral Tube size: 8.0 mm Number of attempts: 1 Airway Equipment and Method: Stylet Placement Confirmation: ETT inserted through vocal cords under direct vision,  positive ETCO2 and breath sounds checked- equal and bilateral Secured at: 21 cm Tube secured with: Tape Dental Injury: Teeth and Oropharynx as per pre-operative assessment

## 2013-01-21 NOTE — Transfer of Care (Signed)
Immediate Anesthesia Transfer of Care Note  Patient: Samuel [name]  Procedure(s) Performed: Procedure(s): TRANSCATHETER AORTIC VALVE REPLACEMENT, TRANSAORTIC (N/A) INTRAOPERATIVE TRANSESOPHAGEAL ECHOCARDIOGRAM (N/A) MINI/LIMITED THORACOTOMY (Right)  Patient Location: SICU  Anesthesia Type:General  Level of Consciousness: Patient remains intubated per anesthesia plan  Airway & Oxygen Therapy: Patient remains intubated per anesthesia plan and Patient placed on Ventilator (see vital sign flow sheet for setting)  Post-op Assessment: Report given to PACU RN  Post vital signs: Reviewed and stable  Complications: No apparent anesthesia complications

## 2013-01-21 NOTE — Preoperative (Signed)
Beta Blockers   Reason not to administer Beta Blockers:Not Applicable 

## 2013-01-21 NOTE — OR Nursing (Signed)
2nd call to SICU 

## 2013-01-21 NOTE — Progress Notes (Signed)
Echocardiogram Echocardiogram Transesophageal in the OR has been performed.  [name] 01/21/2013, 3:34 PM

## 2013-01-21 NOTE — Op Note (Addendum)
[facility]  TAVR OPERATIVE NOTE   Date of Procedure:  01/21/2013  Preoperative Diagnosis: Severe Aortic Stenosis   Postoperative Diagnosis: Same   Procedure:    Transcatheter Aortic Valve Replacement - Transaortic Approach  Edwards Sapien XT THV (size 29 mm, model # 9300TFX, serial # [id number])   Co-Surgeons:  Salvatore Decent. Cornelius Moras, MD and Tonny Bollman, MD  Assistants:   Alleen Borne, MD   Anesthesiologist:  Dr Noreene Larsson  Echocardiographer:  Dr Eden Emms  Pre-operative Echo Findings:  severe aortic stenosis  Normal left ventricular systolic function  Moderate (2+) aortic insufficiency  Post-operative Echo Findings:  1+ (mild) paravalvular leak  normal left ventricular systolic function  BRIEF CLINICAL NOTE AND INDICATIONS FOR SURGERY  During the course of the patient's preoperative work up they have been evaluated comprehensively by a multidisciplinary team of specialists coordinated through the Multidisciplinary Heart Valve Clinic in the [facility].  They have been demonstrated to suffer from symptomatic severe aortic stenosis as noted above. The patient has been counseled extensively as to the relative risks and benefits of all options for the treatment of severe aortic stenosis including long term medical therapy, conventional surgery for aortic valve replacement, and transcatheter aortic valve replacement.  The patient has been independently evaluated by two cardiac surgeons including Dr Cornelius Moras and Dr. Laneta Simmers, and they are felt to be at high risk for conventional surgical aortic valve replacement based upon a predicted risk of mortality using the Society of Thoracic Surgeons risk calculator of 6.0% Both surgeons indicated the patient would be a poor candidate for conventional surgery (predicted risk of mortality >15% and/or predicted risk of permanent morbidity >50%) because of comorbidities including severe kyphoscoliosis and advanced age  with limited mobility.   Based upon review of all of the patient's preoperative diagnostic tests they are felt to be candidate for transcatheter aortic valve replacement using the transaortic approach as an alternative to high risk conventional surgery.    Following the decision to proceed with transcatheter aortic valve replacement, a discussion has been held regarding what types of management strategies would be attempted intraoperatively in the event of life-threatening complications, including whether or not the patient would be considered a candidate for the use of cardiopulmonary bypass and/or conversion to open sternotomy for attempted surgical intervention.  The patient has been advised of a variety of complications that might develop peculiar to this approach including but not limited to risks of death, stroke, paravalvular leak, aortic dissection or other major vascular complications, aortic annulus rupture, device embolization, cardiac rupture or perforation, acute myocardial infarction, arrhythmia, heart block or bradycardia requiring permanent pacemaker placement, congestive heart failure, respiratory failure, renal failure, pneumonia, infection, other late complications related to structural valve deterioration or migration, or other complications that might ultimately cause a temporary or permanent loss of functional independence or other long term morbidity.  The patient provides full informed consent for the procedure as described and all questions were answered preoperatively.    DETAILS OF THE OPERATIVE PROCEDURE  PREPARATION:    The patient is brought to the operating room on the above mentioned date and central monitoring was established by the anesthesia team including placement of Swan-Ganz catheter and radial arterial line. The patient is placed in the supine position on the operating table.  Intravenous antibiotics are administered. General endotracheal anesthesia is induced  uneventfully. A Foley catheter is placed.  Baseline transesophageal echocardiogram was performed.    The patient's chest, abdomen, both  groins, and both lower extremities are prepared and draped in a sterile manner. A time out procedure is performed.   PERIPHERAL ACCESS:    Using the modified Seldinger technique, femoral arterial and venous access were obtained with placement of 6 Fr sheaths on the right and left sides.  A pigtail diagnostic catheter was passed through the left femoral arterial sheath under fluoroscopic guidance into the aortic root.  A temporary transvenous pacemaker catheter was passed through the left femoral venous sheath under fluoroscopic guidance into the right ventricle.  The pacemaker was tested to ensure stable lead placement and pacemaker capture. Aortic root angiography was performed in order to determine the optimal angiographic angle for valve deployment.  TRANSAORTIC ACCESS:   PLEASE SEE THE OPERATIVE NOTE OF DR Cornelius Moras FOR DETAILS.  A 6 Fr sheath was placed over a j-wire into the ascending aorta and the aortic valve was crossed. A pigtail catheter was advanced and simultaneous LV and ?Ao pressures were recorded. An Amplatz extra-stiff wire was then advanced into the LV apex and the pigtail was removed. The Ascendra sheath was then placed over the stiff wire into the ascending aorta above the aortic valve.  The remainder of the operative details including balloon aortic valvuloplasty, heart valve deployment, and procedure completion are detailed in the note of Dr Cornelius Moras.  The femoral arterial and venous sheaths were removed and manual pressure applied for hemostasis.   No blood products were administered during the operation.  The patient received a total of 61 mL of intravenous contrast during the procedure.  Tonny Bollman 01/21/2013 3:07 PM

## 2013-01-21 NOTE — Progress Notes (Signed)
S/p TAVR  Intubated, sedated  BP 100/66  Pulse 91  Temp(Src) 97.5 F (36.4 C) (Oral)  Resp 11  Ht 5' 8.9" (1.75 m)  Wt 154 lb 15.7 oz (70.3 kg)  BMI 22.96 kg/m2  SpO2 100%  PA 41/29 CI 1.5   Intake/Output Summary (Last 24 hours) at 01/21/13 1759 Last data filed at 01/21/13 1700  Gross per 24 hour  Intake 3869.2 ml  Output   1340 ml  Net 2529.2 ml   Hct 31  Low index early post TAVR- bradycardic with rate of 52, no pacing capability at present

## 2013-01-22 ENCOUNTER — Other Ambulatory Visit: Payer: Self-pay | Admitting: *Deleted

## 2013-01-22 ENCOUNTER — Inpatient Hospital Stay (HOSPITAL_COMMUNITY): Payer: Medicare Other

## 2013-01-22 DIAGNOSIS — I359 Nonrheumatic aortic valve disorder, unspecified: Principal | ICD-10-CM

## 2013-01-22 DIAGNOSIS — I369 Nonrheumatic tricuspid valve disorder, unspecified: Secondary | ICD-10-CM

## 2013-01-22 LAB — GLUCOSE, CAPILLARY
Glucose-Capillary: 108 mg/dL — ABNORMAL HIGH (ref 70–99)
Glucose-Capillary: 137 mg/dL — ABNORMAL HIGH (ref 70–99)
Glucose-Capillary: 130 mg/dL — ABNORMAL HIGH (ref 70–99)
Glucose-Capillary: 129 mg/dL — ABNORMAL HIGH (ref 70–99)
Glucose-Capillary: 170 mg/dL — ABNORMAL HIGH (ref 70–99)
Glucose-Capillary: 119 mg/dL — ABNORMAL HIGH (ref 70–99)

## 2013-01-22 LAB — POCT I-STAT, CHEM 8
Calcium, Ion: 1.15 mmol/L (ref 1.13–1.30)
Chloride: 100 mEq/L (ref 96–112)
Creatinine, Ser: 1.2 mg/dL (ref 0.50–1.35)
Glucose, Bld: 125 mg/dL — ABNORMAL HIGH (ref 70–99)
HCT: 33 % — ABNORMAL LOW (ref 39.0–52.0)
TCO2: 24 mmol/L (ref 0–100)

## 2013-01-22 LAB — MAGNESIUM: Magnesium: 2 mg/dL (ref 1.5–2.5)

## 2013-01-22 LAB — CBC
HCT: 28.8 % — ABNORMAL LOW (ref 39.0–52.0)
HCT: 31.6 % — ABNORMAL LOW (ref 39.0–52.0)
Hemoglobin: 9.9 g/dL — ABNORMAL LOW (ref 13.0–17.0)
Hemoglobin: 11 g/dL — ABNORMAL LOW (ref 13.0–17.0)
MCH: 32.2 pg (ref 26.0–34.0)
MCH: 33 pg (ref 26.0–34.0)
MCHC: 34.4 g/dL (ref 30.0–36.0)
MCV: 93.8 fL (ref 78.0–100.0)
MCV: 94.9 fL (ref 78.0–100.0)
Platelets: 80 10*3/uL — ABNORMAL LOW (ref 150–400)
Platelets: 81 10*3/uL — ABNORMAL LOW (ref 150–400)
RBC: 3.07 MIL/uL — ABNORMAL LOW (ref 4.22–5.81)
RBC: 3.33 MIL/uL — ABNORMAL LOW (ref 4.22–5.81)

## 2013-01-22 LAB — BASIC METABOLIC PANEL
CO2: 22 mEq/L (ref 19–32)
Calcium: 7.5 mg/dL — ABNORMAL LOW (ref 8.4–10.5)
Creatinine, Ser: 0.91 mg/dL (ref 0.50–1.35)
GFR calc non Af Amer: 76 mL/min — ABNORMAL LOW (ref >90)
Glucose, Bld: 155 mg/dL — ABNORMAL HIGH (ref 70–99)
Potassium: 4.4 mEq/L (ref 3.5–5.1)
Sodium: 138 mEq/L (ref 135–145)

## 2013-01-22 LAB — POCT I-STAT 3, ART BLOOD GAS (G3+)
Acid-base deficit: 3 mmol/L — ABNORMAL HIGH (ref 0.0–2.0)
O2 Saturation: 97 %
Patient temperature: 37.8
TCO2: 23 mmol/L (ref 0–100)
pCO2 arterial: 43.1 mmHg (ref 35.0–45.0)
pCO2 arterial: 42.8 mmHg (ref 35.0–45.0)
pH, Arterial: 7.38 (ref 7.350–7.450)

## 2013-01-22 LAB — CREATININE, SERUM: Creatinine, Ser: 1.02 mg/dL (ref 0.50–1.35)

## 2013-01-22 MED ORDER — TRAMADOL HCL 50 MG PO TABS
50.0000 mg | ORAL_TABLET | Freq: Four times a day (QID) | ORAL | Status: DC | PRN
  Administered 2013-01-22 (×2): 50 mg via ORAL
  Filled 2013-01-22 (×2): qty 1

## 2013-01-22 MED ORDER — INSULIN ASPART 100 UNIT/ML ~~LOC~~ SOLN
0.0000 [IU] | SUBCUTANEOUS | Status: DC
  Administered 2013-01-22: 4 [IU] via SUBCUTANEOUS

## 2013-01-22 MED ORDER — MORPHINE SULFATE 2 MG/ML IJ SOLN
2.0000 mg | INTRAMUSCULAR | Status: DC | PRN
  Administered 2013-01-22: 2 mg via INTRAVENOUS
  Filled 2013-01-22: qty 1

## 2013-01-22 MED ORDER — SODIUM CHLORIDE 0.45 % IV SOLN
INTRAVENOUS | Status: DC
  Administered 2013-01-22: 20 mL/h via INTRAVENOUS

## 2013-01-22 MED ORDER — FUROSEMIDE 10 MG/ML IJ SOLN
20.0000 mg | Freq: Two times a day (BID) | INTRAMUSCULAR | Status: AC
  Administered 2013-01-22 (×2): 20 mg via INTRAVENOUS
  Filled 2013-01-22 (×2): qty 2

## 2013-01-22 MED ORDER — METOPROLOL TARTRATE 12.5 MG HALF TABLET
12.5000 mg | ORAL_TABLET | Freq: Two times a day (BID) | ORAL | Status: DC
  Administered 2013-01-22 – 2013-01-24 (×5): 12.5 mg via ORAL
  Filled 2013-01-22 (×6): qty 1

## 2013-01-22 MED ORDER — ASPIRIN EC 81 MG PO TBEC
81.0000 mg | DELAYED_RELEASE_TABLET | Freq: Every day | ORAL | Status: DC
  Administered 2013-01-22 – 2013-01-24 (×3): 81 mg via ORAL
  Filled 2013-01-22 (×3): qty 1

## 2013-01-22 MED ORDER — ALPRAZOLAM 0.25 MG PO TABS
0.2500 mg | ORAL_TABLET | Freq: Two times a day (BID) | ORAL | Status: DC | PRN
  Administered 2013-01-22 – 2013-01-24 (×4): 0.25 mg via ORAL
  Filled 2013-01-22 (×4): qty 1

## 2013-01-22 MED ORDER — PANTOPRAZOLE SODIUM 40 MG PO TBEC
40.0000 mg | DELAYED_RELEASE_TABLET | Freq: Every day | ORAL | Status: DC
  Administered 2013-01-22 – 2013-01-24 (×3): 40 mg via ORAL
  Filled 2013-01-22 (×3): qty 1

## 2013-01-22 MED ORDER — AMLODIPINE BESYLATE 10 MG PO TABS
10.0000 mg | ORAL_TABLET | Freq: Every day | ORAL | Status: DC
  Administered 2013-01-22 – 2013-01-24 (×3): 10 mg via ORAL
  Filled 2013-01-22 (×3): qty 1

## 2013-01-22 MED FILL — Norepinephrine Bitartrate IV Soln 1 MG/ML (Base Equivalent): INTRAMUSCULAR | Qty: 8 | Status: AC

## 2013-01-22 MED FILL — Sodium Chloride Irrigation Soln 0.9%: Qty: 1000 | Status: AC

## 2013-01-22 MED FILL — Dextrose Inj 5%: INTRAVENOUS | Qty: 250 | Status: AC

## 2013-01-22 MED FILL — Magnesium Sulfate Inj 50%: INTRAMUSCULAR | Qty: 10 | Status: AC

## 2013-01-22 MED FILL — Heparin Sodium (Porcine) Inj 1000 Unit/ML: INTRAMUSCULAR | Qty: 30 | Status: AC

## 2013-01-22 MED FILL — Potassium Chloride Inj 2 mEq/ML: INTRAVENOUS | Qty: 40 | Status: AC

## 2013-01-22 MED FILL — Sodium Chloride IV Soln 0.9%: INTRAVENOUS | Qty: 1000 | Status: AC

## 2013-01-22 NOTE — Progress Notes (Addendum)
      [address]       [name] [address]             [phone]        CARDIOTHORACIC SURGERY PROGRESS NOTE   R1 Day Post-Op Procedure(s) (LRB): TRANSCATHETER AORTIC VALVE REPLACEMENT, TRANSAORTIC (N/A) INTRAOPERATIVE TRANSESOPHAGEAL ECHOCARDIOGRAM (N/A) MINI/LIMITED THORACOTOMY (Right)  Subjective: Looks good.  Mild soreness in chest and typical chronic pain left hip.  Breathing comfortably.  Objective: Vital signs: BP Readings from Last 1 Encounters:  01/22/13 119/52   Pulse Readings from Last 1 Encounters:  01/22/13 64   Resp Readings from Last 1 Encounters:  01/22/13 12   Temp Readings from Last 1 Encounters:  01/22/13 98.8 F (37.1 C) Core (Comment)    Hemodynamics: PAP: (31-48)/(18-33) 38/18 mmHg CO:  [2.4 L/min-4.2 L/min] 4.2 L/min CI:  [1.3 L/min/m2-2.3 L/min/m2] 2.2 L/min/m2  Physical Exam:  Rhythm:   sinus  Breath sounds: clear  Heart sounds:  RRR  Incisions:  Clean and dry  Abdomen:  Soft, non-distended, non-tender  Extremities:  Warm, well-perfused    Intake/Output from previous day: 10/21 0701 - 10/22 0700 In: 5047.2 [I.V.:4047.2; IV Piggyback:1000] Out: 1780 [Urine:1450; Blood:200; Chest Tube:130] Intake/Output this shift:    Lab Results:  CBC: Recent Labs  01/21/13 1629 01/22/13 0405  WBC 5.2 7.2  HGB 10.9* 9.9*  HCT 30.9* 28.8*  PLT 112* 80*    BMET:  Recent Labs  01/21/13 1617 01/22/13 0405  NA 142 138  K 3.2* 4.4  CL  --  106  CO2  --  22  GLUCOSE 79 155*  BUN  --  14  CREATININE  --  0.91  CALCIUM  --  7.5*     CBG (last 3)   Recent Labs  01/21/13 2034 01/21/13 2312 01/22/13 0405  GLUCAP 123* 108* 137*    ABG    Component Value Date/Time   PHART 7.380 01/22/2013 0239   PCO2ART 42.8 01/22/2013 0239   PO2ART 105.0* 01/22/2013 0239   HCO3 25.1* 01/22/2013 0239   TCO2 26 01/22/2013 0239   ACIDBASEDEF 3.0* 01/22/2013 0133   O2SAT 98.0 01/22/2013 0239    CXR: CLINICAL DATA:  Status post TAVR  EXAM:  PORTABLE CHEST - 1 VIEW  COMPARISON: 01/21/2013  FINDINGS:  Postsurgical changes are again seen. Aortic valve prosthesis is  again noted. Swan-Ganz catheter is noted within the pulmonary  outflow tract. A right-sided chest tube is seen without  pneumothorax. Significant scoliosis is seen. The nasogastric  catheter and endotracheal tube have been removed in the interval.  Slight increased density is noted over the bases bilaterally likely  related to small effusions.  IMPRESSION:  Interval removal of endotracheal tube and nasogastric catheter.  Likely small pleural effusions bilaterally.  Electronically Signed  By: Alcide Clever M.D.  On: 01/22/2013 07:43    Assessment/Plan: S/P Procedure(s) (LRB): TRANSCATHETER AORTIC VALVE REPLACEMENT, TRANSAORTIC (N/A) INTRAOPERATIVE TRANSESOPHAGEAL ECHOCARDIOGRAM (N/A) MINI/LIMITED THORACOTOMY (Right)  Doing well POD1 Hypertension, on NTG drip Expected post op acute blood loss anemia, mild, stable Expected post op volume excess, mild Expected post op atelectasis, mild Post op thrombocytopenia, mild   Mobilize  D/C tube and lines  Pulmonary toilet  Restart Norvasc  Wean NTG drip off  Transfer step down once stable off drips  Re-consult PT   OWEN,CLARENCE H 01/22/2013 8:26 AM

## 2013-01-22 NOTE — Progress Notes (Signed)
Anesthesiology Follow-up:  Awake and alert, complaining of R. chest incisional soreness, breathing improved, hemodynamically stable, neuro intact.  VS: T-37.1 BP 119  HR 64 (SR) RR- 14 O2 Sat 100% on 2L PAP 35/20 CO/CI 4.2/2.2  K-4.4 BUN/Cr 14/0.91 H/H 9.9/28.8 Plts 80,000  Extubated at 12:00 AM last night, 8 hours post-op  79 year old 1 day S/P TAVR via transaortic approac via R. thoracotomy. Doing well, so far, no apparent complications.

## 2013-01-22 NOTE — OR Nursing (Signed)
Late entry to update number of screws used; wrong amount entered at time of surgery.

## 2013-01-22 NOTE — Procedures (Signed)
Extubation Procedure Note  Patient Details:   Name: Samuel [name] DOB: 05/27/1926 MRN: [id number]   Airway Documentation:  Airway 8 mm (Active)  Secured at (cm) 21 cm 01/22/2013 12:03 AM  Measured From Lips 01/22/2013 12:03 AM  Secured Location Left 01/22/2013 12:03 AM  Secured By Pink Tape 01/22/2013 12:03 AM  Site Condition Dry 01/21/2013  4:10 PM    Evaluation  O2 sats: stable throughout Complications: No apparent complications Patient did tolerate procedure well. Bilateral Breath Sounds: Clear Suctioning: Airway;Oral Nif -22, VC 1200, Cuff leak noted. Pt. Extubated to 4 lpm nasal cannula.  Pt. Able to vocalize post extubation.    Clent Ridges 01/22/2013, 1:44 AM

## 2013-01-22 NOTE — Progress Notes (Signed)
  Echocardiogram 2D Echocardiogram has been performed.  Samuel [name] FRANCES 01/22/2013, 11:48 AM

## 2013-01-22 NOTE — Progress Notes (Signed)
    Subjective:  Pt doing ok. C/o soreness at chest site and hip. Otherwise no c/o.  Objective:  Vital Signs in the last 24 hours: Temp:  [95.5 F (35.3 C)-101.3 F (38.5 C)] 98.8 F (37.1 C) (10/22 0700) Pulse Rate:  [52-93] 64 (10/22 0700) Resp:  [8-23] 12 (10/22 0700) BP: (92-180)/(43-66) 119/52 mmHg (10/22 0700) SpO2:  [95 %-100 %] 100 % (10/22 0700) Arterial Line BP: (106-174)/(44-83) 151/51 mmHg (10/22 0700) FiO2 (%):  [40 %-50 %] 40 % (10/22 0052)  Intake/Output from previous day: 10/21 0701 - 10/22 0700 In: 5017.2 [I.V.:4017.2; IV Piggyback:1000] Out: 1780 [Urine:1450; Blood:200; Chest Tube:130]  Physical Exam: Pt is alert and oriented, NAD HEENT: normal Neck: JVP - normal Lungs: CTA bilaterally CV: RRR with 2/6 diastolic decrescendo murmur at the LLSB Abd: soft, NT, Positive BS, no hepatomegaly Ext: no C/C/E Skin: warm/dry no rash   Lab Results:  Recent Labs  01/21/13 1629 01/22/13 0405  WBC 5.2 7.2  HGB 10.9* 9.9*  PLT 112* 80*    Recent Labs  01/21/13 1617 01/22/13 0405  NA 142 138  K 3.2* 4.4  CL  --  106  CO2  --  22  GLUCOSE 79 155*  BUN  --  14  CREATININE  --  0.91   No results found for this basename: TROPONINI, CK, MB,  in the last 72 hours  Tele: Sinus rhythm, personally reviewed  Assessment/Plan:  Day #1 post-TAVR for severe AS. Mild AI appreciable on exam with diastolic decrescendo murmur. Clinically looks great with stable rhythm and hemodynamics. Suspect he will tx to stepdown today as long as he can wean off of IV NTG. Plt 80,000 but baseline plt 110-115,000. Think ok to start plavix. Management per Dr Cornelius Moras.   Tonny Bollman, M.D. 01/22/2013, 7:28 AM

## 2013-01-23 ENCOUNTER — Encounter (HOSPITAL_COMMUNITY): Payer: Self-pay | Admitting: Thoracic Surgery (Cardiothoracic Vascular Surgery)

## 2013-01-23 ENCOUNTER — Inpatient Hospital Stay (HOSPITAL_COMMUNITY): Payer: Medicare Other

## 2013-01-23 LAB — CBC
HCT: 31 % — ABNORMAL LOW (ref 39.0–52.0)
Hemoglobin: 10.5 g/dL — ABNORMAL LOW (ref 13.0–17.0)
MCH: 32.2 pg (ref 26.0–34.0)
MCV: 95.1 fL (ref 78.0–100.0)
Platelets: 75 10*3/uL — ABNORMAL LOW (ref 150–400)
RDW: 13.6 % (ref 11.5–15.5)
WBC: 6.2 10*3/uL (ref 4.0–10.5)

## 2013-01-23 LAB — BASIC METABOLIC PANEL
BUN: 20 mg/dL (ref 6–23)
CO2: 27 mEq/L (ref 19–32)
Calcium: 7.7 mg/dL — ABNORMAL LOW (ref 8.4–10.5)
Chloride: 103 mEq/L (ref 96–112)
Creatinine, Ser: 1.01 mg/dL (ref 0.50–1.35)
Glucose, Bld: 126 mg/dL — ABNORMAL HIGH (ref 70–99)
Potassium: 4.3 mEq/L (ref 3.5–5.1)

## 2013-01-23 LAB — GLUCOSE, CAPILLARY

## 2013-01-23 MED ORDER — SODIUM CHLORIDE 0.9 % IJ SOLN: 3.0000 mL | INTRAMUSCULAR | Status: DC | PRN

## 2013-01-23 MED ORDER — ENOXAPARIN SODIUM 30 MG/0.3ML ~~LOC~~ SOLN
30.0000 mg | SUBCUTANEOUS | Status: DC
  Administered 2013-01-24: 30 mg via SUBCUTANEOUS
  Filled 2013-01-23 (×2): qty 0.3

## 2013-01-23 MED ORDER — SODIUM CHLORIDE 0.9 % IV SOLN: 250.0000 mL | INTRAVENOUS | Status: DC | PRN

## 2013-01-23 MED ORDER — POTASSIUM CHLORIDE CRYS ER 20 MEQ PO TBCR
20.0000 meq | EXTENDED_RELEASE_TABLET | Freq: Every day | ORAL | Status: AC
  Administered 2013-01-23 – 2013-01-24 (×2): 20 meq via ORAL
  Filled 2013-01-23 (×2): qty 1

## 2013-01-23 MED ORDER — FUROSEMIDE 40 MG PO TABS
40.0000 mg | ORAL_TABLET | Freq: Every day | ORAL | Status: AC
  Administered 2013-01-23 – 2013-01-24 (×2): 40 mg via ORAL
  Filled 2013-01-23 (×2): qty 1

## 2013-01-23 MED ORDER — BISACODYL 10 MG RE SUPP: 10.0000 mg | Freq: Every day | RECTAL | Status: DC | PRN

## 2013-01-23 MED ORDER — MOVING RIGHT ALONG BOOK
Freq: Once | Status: AC
  Administered 2013-01-23: 08:00:00
  Filled 2013-01-23: qty 1

## 2013-01-23 MED ORDER — DOCUSATE SODIUM 100 MG PO CAPS
200.0000 mg | ORAL_CAPSULE | Freq: Every day | ORAL | Status: DC
  Administered 2013-01-23 – 2013-01-24 (×2): 200 mg via ORAL
  Filled 2013-01-23 (×2): qty 2

## 2013-01-23 MED ORDER — SODIUM CHLORIDE 0.9 % IJ SOLN
3.0000 mL | Freq: Two times a day (BID) | INTRAMUSCULAR | Status: DC
  Administered 2013-01-23 – 2013-01-24 (×2): 3 mL via INTRAVENOUS

## 2013-01-23 MED ORDER — LACTULOSE 10 GM/15ML PO SOLN
20.0000 g | Freq: Every day | ORAL | Status: DC | PRN
  Filled 2013-01-23: qty 30

## 2013-01-23 NOTE — Progress Notes (Addendum)
[address]       [name] [address]             [phone]        CARDIOTHORACIC SURGERY PROGRESS NOTE   R2 Days Post-Op Procedure(s) (LRB): TRANSCATHETER AORTIC VALVE REPLACEMENT, TRANSAORTIC (N/A) INTRAOPERATIVE TRANSESOPHAGEAL ECHOCARDIOGRAM (N/A) MINI/LIMITED THORACOTOMY (Right)  Subjective: Some soreness in chest last night, otherwise no complaints.  Patient does have chronic difficulty swallowing liquids which has existed for YEARS but nonetheless seems significant  Objective: Vital signs: BP Readings from Last 1 Encounters:  01/23/13 144/63   Pulse Readings from Last 1 Encounters:  01/23/13 84   Resp Readings from Last 1 Encounters:  01/23/13 19   Temp Readings from Last 1 Encounters:  01/23/13 97.6 F (36.4 C) Oral    Hemodynamics: PAP: (35-39)/(20-24) 38/23 mmHg  Physical Exam:  Rhythm:   sinus  Breath sounds: clear  Heart sounds:  RRR w/ II/VI systolic murmur, ? Slight early diastolic murmur  Incisions:  Clean and dry  Abdomen:  Soft, non-distended, non-tender  Extremities:  Warm, well-perfused    Intake/Output from previous day: 10/22 0701 - 10/23 0700 In: 1219 [P.O.:680; I.V.:389; IV Piggyback:150] Out: 1301 [Urine:1300; Stool:1] Intake/Output this shift:    Lab Results:  CBC: Recent Labs  01/22/13 1810 01/23/13 0500  WBC 7.5 6.2  HGB 11.0* 10.5*  HCT 31.6* 31.0*  PLT 81* 75*    BMET:  Recent Labs  01/22/13 0405 01/22/13 1808 01/22/13 1810 01/23/13 0500  NA 138 138  --  136  K 4.4 3.9  --  4.3  CL 106 100  --  103  CO2 22  --   --  27  GLUCOSE 155* 125*  --  126*  BUN 14 17  --  20  CREATININE 0.91 1.20 1.02 1.01  CALCIUM 7.5*  --   --  7.7*     CBG (last 3)   Recent Labs  01/22/13 1951 01/22/13 2300 01/23/13 0422  GLUCAP 170* 119* 117*    ABG    Component Value Date/Time   PHART 7.380 01/22/2013 0239   PCO2ART 42.8 01/22/2013 0239   PO2ART 105.0* 01/22/2013 0239   HCO3 25.1*  01/22/2013 0239   TCO2 24 01/22/2013 1808   ACIDBASEDEF 3.0* 01/22/2013 0133   O2SAT 98.0 01/22/2013 0239    CXR: Stable bibasilar atelectasis, mild  Transthoracic Echocardiography  Patient: Samuel [name], Samuel [name] MR #: [id number] Study Date: 01/22/2013 Gender: M Age: 79 Height: 175cm Weight: 70.3kg BSA: 1.59m^2 Pt. Status: Room: 2S06C  PERFORMING [place], [facility] ADMITTING Tressie Stalker MD ATTENDING Tressie Stalker MD Rutherford Limerick MD SONOGRAPHER Silvano Bilis, RCS cc:  ------------------------------------------------------------ LV EF: 55%  ------------------------------------------------------------ History: PMH: Alcohol abuse. s/p TAVR. Chronic obstructive pulmonary disease. Risk factors: Former tobacco use.  ------------------------------------------------------------ Study Conclusions  - Left ventricle: The cavity size was normal. Wall thickness was increased in a pattern of mild LVH. The estimated ejection fraction was 55%. Wall motion was normal; there were no regional wall motion abnormalities. Doppler parameters are consistent with abnormal left ventricular relaxation (grade 1 diastolic dysfunction). - Aortic valve: The TAVR valve is stable. There may be mild AI. There does not appear to be a significant gradient. - Mitral valve: Moderately calcified annulus. - Right ventricle: The cavity size was mildly dilated. - Pulmonary arteries: PA peak pressure: 37mm Hg (S). Transthoracic echocardiography. M-mode, complete 2D, spectral Doppler, and color Doppler. Height: Height: 175cm. Height: 68.9in. Weight: Weight: 70.3kg. Weight:  154.7lb. Body mass index: BMI: 23kg/m^2. Body surface area: BSA: 1.18m^2. Blood pressure: 119/52. Patient status: Inpatient. Location: ICU/CCU  ------------------------------------------------------------  ------------------------------------------------------------ Left ventricle: The cavity size was normal. Wall  thickness was increased in a pattern of mild LVH. The estimated ejection fraction was 55%. Wall motion was normal; there were no regional wall motion abnormalities. Doppler parameters are consistent with abnormal left ventricular relaxation (grade 1 diastolic dysfunction).  ------------------------------------------------------------ Aortic valve: The TAVR valve is stable. There may be mild AI. There does not appear to be a significant gradient. Doppler: VTI ratio of LVOT to aortic valve: 0.65. Valve area: 2.04cm^2(VTI). Indexed valve area: 1.1cm^2/m^2 (VTI). Peak velocity ratio of LVOT to aortic valve: 0.51. Valve area: 1.59cm^2 (Vmax). Indexed valve area: 0.86cm^2/m^2 (Vmax). Mean gradient: 7mm Hg (S). Peak gradient: 12mm Hg (S).  ------------------------------------------------------------ Mitral valve: Moderately calcified annulus. Doppler: No significant regurgitation. Peak gradient: 3mm Hg (D).  ------------------------------------------------------------ Left atrium: The atrium was at the upper limits of normal in size.  ------------------------------------------------------------ Right ventricle: The cavity size was mildly dilated. Systolic function was normal.  ------------------------------------------------------------ Pulmonic valve: Poorly visualized. Doppler: No significant regurgitation.  ------------------------------------------------------------ Tricuspid valve: Structurally normal valve. Leaflet separation was normal. Doppler: Transvalvular velocity was within the normal range. Mild regurgitation.  ------------------------------------------------------------ Right atrium: Poorly visualized.  ------------------------------------------------------------ Pericardium: There was no pericardial effusion.  ------------------------------------------------------------  2D measurements Normal Doppler measurements Normal Left ventricle Main pulmonary LVID ED,  34.4 mm 43-52 artery chord, Pressure 37 mm Hg =30 PLAX , S LVID ES, 24.6 mm 23-38 Left ventricle chord, Ea, lat 5.15 cm/s ------ PLAX ann, FS, chord, 28 % >29 tiss DP PLAX E/Ea, 16.19 ------ LVPW, ED 10.7 mm ------ lat ann, IVS/LVPW 1 <1.3 tiss DP ratio, ED Ea, med 4.17 cm/s ------ Ventricular septum ann, IVS, ED 10.7 mm ------ tiss DP LVOT E/Ea, 20 ------ Diam, S 20 mm ------ med ann, Area 3.14 cm^2 ------ tiss DP Aorta LVOT Root diam, 27 mm ------ Peak 89.4 cm/s ------ ED vel, S Left atrium VTI, S 21.8 cm ------ AP dim 34 mm ------ Aortic valve AP dim 1.84 cm/m^2 <2.2 Peak 176 cm/s ------ index vel, S Mean 119 cm/s ------ vel, S VTI, S 33.6 cm ------ Mean 7 mm Hg ------ gradient , S Peak 12 mm Hg ------ gradient , S VTI 0.65 ------ ratio LVOT/AV Area, 2.04 cm^2 ------ VTI Area 1.1 cm^2/m^ ------ index 2 (VTI) Peak vel 0.51 ------ ratio, LVOT/AV Area, 1.59 cm^2 ------ Vmax Area 0.86 cm^2/m^ ------ index 2 (Vmax) Regurg 259 cm/s ------ vel, ED Regurg 496 ms ------ PHT Regurg 27 mm Hg ------ gradient , ED Mitral valve Peak E 83.4 cm/s ------ vel Peak A 128 cm/s ------ vel Peak 3 mm Hg ------ gradient , D Peak E/A 0.7 ------ ratio Tricuspid valve Regurg 258 cm/s ------ peak vel Peak 27 mm Hg ------ RV-RA gradient , S Systemic veins Estimate 10 mm Hg ------ d CVP Right ventricle Pressure 37 mm Hg <30 , S  ------------------------------------------------------------ Prepared and Electronically Authenticated by  Willa Rough 2014-10-22T17:12:17.327   Assessment/Plan: S/P Procedure(s) (LRB): TRANSCATHETER AORTIC VALVE REPLACEMENT, TRANSAORTIC (N/A) INTRAOPERATIVE TRANSESOPHAGEAL ECHOCARDIOGRAM (N/A) MINI/LIMITED THORACOTOMY (Right)  Overall doing well POD2 Maintaining NSR w/ stable BP Expected post op acute blood loss anemia, mild, stable Expected post op atelectasis, mild Expected post op volume excess, mild, diuresing  some Limited physical mobility with chronic pain and generalized debilitated state Chronic difficulty swallowing, especially thin liquids and pills - present for years  Mobilize  D/C foley  Pulm toilet  Transfer step down  PT/OT consult  SLP consult for swallowing eval  Will need short term SNF placement   Vicie Cech H 01/23/2013 8:01 AM

## 2013-01-23 NOTE — Evaluation (Signed)
Clinical/Bedside Swallow Evaluation Patient Details  Name: Samuel [name] MRN: [id number] Date of Birth: November 16, 1926  Today's Date: 01/23/2013 Time: 9147-8295 SLP Time Calculation (min): 45 min  Past Medical History:  Past Medical History  Diagnosis Date  . Depression   . COPD (chronic obstructive pulmonary disease)   . Aortic stenosis     Echo 7/14:  Severe LVH, EF 55-60%, severe AS, mean gradient 78 mmHg, MAC, mild LAE  . Essential hypertension, benign   . Decreased libido   . DIZZINESS, CHRONIC   . Alcohol abuse   . Shortness of breath 10/24/11    "all the time"  . Arthritis     "in the low vertebra"  . Chronic lower back pain   . Scoliosis/kyphoscoliosis 10/28/2011  . Complication of anesthesia     NO NECK LINES ON RIGHT FOR SURGERY  . Heart murmur   . GERD (gastroesophageal reflux disease)     occ  . Abdominal aortic aneurysm 10/29/2011    3.7cm AAA by CT scan with unusual appearance suggestive of penetrating atherosclerotic ulcer or localized dissection  . CHF (congestive heart failure)   . 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 No past surgeries     HPI:  79 y/o male with history of severe symptomatic aortic stenosis. COPD, GERD,  and alcoholism. Admitted for electiveTRANSCATHETER AORTIC VALVE REPLACEMENT, TRANSAORTIC INTRAOPERATIVE TRANSESOPHAGEAL ECHOCARDIOGRAM  MINI/LIMITED THORACOTOMY.  C/o chronic difficulty swallowing, particularly with thin liquids and pills for years.     Assessment / Plan / Recommendation Clinical Impression  Patient presents with signs of a primary esophageal dysphagia characterized with c/o globus, grimacing intermittently during the swallow, and signs of regurgitation s/p swallow with thin liquids followed by  throat clear/cough indicative of decreasd airway protection. Despite above, oropharyngeal swallow appears relatively intact. SLP provided verbal cueing for small single sips without use of straw, which in addition to independently use of small bites and slowed rate of intake, decreased overt indication of aspiration. Discussed results and risks with patient who was in agreement with proceeding with instrumental testing to determine least restrictive diet. Hopeful that patient will be able to remain on relatively liberal diet with precautions and compensatory strategies.     Aspiration Risk  Moderate    Diet Recommendation Regular;Thin liquid   Liquid Administration via: Cup;No straw Medication Administration: Whole meds with puree (one at a time) Supervision: Patient able to self feed;Intermittent supervision to cue for compensatory strategies Compensations: Slow rate;Small sips/bites;Multiple dry swallows after each bite/sip;Follow solids with liquid Postural Changes and/or Swallow Maneuvers: Seated upright 90 degrees;Upright 30-60 min after meal    Other  Recommendations Recommended Consults: MBS Oral Care Recommendations: Oral care BID   Follow Up Recommendations   (TBd pending MBS)       Pertinent Vitals/Pain none        Swallow Study    General HPI: 79 y/o male with history of severe symptomatic aortic stenosis. COPD, GERD,  and alcoholism. Admitted for electiveTRANSCATHETER AORTIC VALVE REPLACEMENT, TRANSAORTIC INTRAOPERATIVE TRANSESOPHAGEAL ECHOCARDIOGRAM  MINI/LIMITED THORACOTOMY.  C/o chronic difficulty swallowing, particularly with thin liquids and pills for years.   Type of Study: Bedside swallow evaluation Previous Swallow Assessment: none Diet Prior to this Study: Regular;Thin liquids Temperature Spikes Noted: No Respiratory Status: Room air History of  Recent Intubation: Yes Length of Intubations (days):  (for surgery only) Behavior/Cognition:  Alert;Cooperative;Pleasant mood Oral Cavity - Dentition: Dentures, top;Dentures, bottom (loose) Self-Feeding Abilities: Able to feed self Patient Positioning: Upright in chair Baseline Vocal Quality: Clear Volitional Cough: Strong Volitional Swallow: Able to elicit    Oral/Motor/Sensory Function Overall Oral Motor/Sensory Function: Appears within functional limits for tasks assessed   Ice Chips Ice chips: Not tested   Thin Liquid Thin Liquid: Impaired Presentation: Cup;Self Fed Pharyngeal  Phase Impairments: Suspected delayed Swallow;Multiple swallows;Throat Clearing - Immediate;Cough - Immediate;Wet Vocal Quality    Nectar Thick Nectar Thick Liquid: Not tested   Honey Thick Honey Thick Liquid: Not tested   Puree Puree: Impaired Presentation: Spoon;Self Fed Pharyngeal Phase Impairments:  (c/o globus, facial grimacing)   Solid   GO   Sofya Moustafa MA, CCC-SLP [phone]  Solid: Impaired Presentation: Self Fed;Spoon Pharyngeal Phase Impairments:  (c/o globus, facial grimacing)       Samuel [name] 01/23/2013,9:47 AM

## 2013-01-23 NOTE — Progress Notes (Signed)
CARDIAC REHAB PHASE I   PRE:  Rate/Rhythm: 84SR  BP:  Supine:   Sitting: 148/62  Standing:    SaO2: 95%RA  MODE:  Ambulation: 150 ft   POST:  Rate/Rhythm: 89 SR  BP:  Supine:   Sitting: 120/60  Standing:    SaO2: 94%RA 1345-1420 Assisted from bathroom and then walked 150 ft on RA with rolling walker and asst x 1. Slow paced. Requested xanax due to feeling SOB. Notified pt's nurse. Tolerated walk well though. To recliner after walk. Family in room. Stopped once to rest. Will see along with PT.   Luetta Nutting, RN BSN  01/23/2013 2:17 PM

## 2013-01-23 NOTE — Evaluation (Signed)
Physical Therapy Evaluation Patient Details Name: Samuel [name] MRN: [id number] DOB: [birth year] Today's Date: 01/23/2013 Time: 4098-1191 PT Time Calculation (min): 24 min  PT Assessment / Plan / Recommendation History of Present Illness  This 79 y.o. male admitted for TRANSCATHETER AORTIC VALVE REPLACEMENT, TRANSAORTIC with mini/limited thorocotomy  Clinical Impression  Pt admitted with above surgery. Pt currently with functional limitations due to the deficits listed below (see PT Problem List). 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 Anticipate he will be able to manage himself at home, but will need assist with community level activities (groceries, appointments).  Pt will benefit from skilled PT to increase their independence and safety with mobility to allow discharge to the venue listed below.        PT Assessment  Patient needs continued PT services    Follow Up Recommendations  Home health PT;Supervision - Intermittent    Does the patient have the potential to tolerate intense rehabilitation      Barriers to Discharge Decreased caregiver support pt has previously driven to get his groceries--will need help at community level until able to drive again    Equipment Recommendations  None recommended by PT    Recommendations for Other Services     Frequency Min 3X/week    Precautions / Restrictions Precautions Precautions: Fall   Pertinent Vitals/Pain Rt side/flank pain 6/10; RN provided medication to assist with pain control       Mobility  Bed Mobility Bed Mobility: Not assessed Transfers Transfers: Sit to Stand;Stand to Sit Sit to Stand: 4: Min assist;With upper extremity assist Stand to Sit: 4: Min guard Details for Transfer Assistance: pt with difficulty pushing with RUE on armrest due to reported Rt side pain. Pt requested therapist "hold his elbow" and he pushed with LUE and  thru elbow to come to stand with min assist. Denies having built up chairs at home "they're low just like these" Ambulation/Gait Ambulation/Gait Assistance: 4: Min assist Ambulation Distance (Feet): 200 Feet Assistive device: Rolling walker Ambulation/Gait Assistance Details: pt pushes RW too far infront of him, but reports he does it this way because he feels more steady; pt required min assist for loss of balance x 1 when quickly turned to look behind himself and let go of RW with one hand. 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 male admitted for TRANSCATHETER AORTIC VALVE REPLACEMENT, TRANSAORTIC with mini/limited thorocotomy       Prior Functioning  Home Living Family/patient expects to be discharged to:: Private residence Living Arrangements: Alone Type of Home: Apartment Home Access: Elevator Home Layout: One level Home Equipment: Environmental consultant - 2 wheels;Walker - 4 wheels;Shower seat;Grab bars - toilet;Grab bars - tub/shower Additional Comments: Pt lives at [facility] Prior Function Level of Independence: Independent with assistive device(s) Comments: Pt ambulates with RW most of the  time, but 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  He reports he was able to independently drive, grocery shop, perform household management Communication Communication: No difficulties    Cognition  Cognition Arousal/Alertness: Awake/alert Behavior During Therapy: WFL for tasks assessed/performed Overall Cognitive Status: Within Functional Limits for tasks assessed    Extremity/Trunk Assessment Upper Extremity Assessment Upper Extremity Assessment: Defer to OT evaluation Lower Extremity Assessment Lower Extremity Assessment: Generalized weakness Cervical / Trunk Assessment Cervical / Trunk Assessment: Kyphotic;Other exceptions (scoliosis)   Balance Balance Balance Assessed: Yes Static Standing Balance Static Standing - Balance Support: Bilateral upper extremity supported Static Standing - Level of Assistance: 5: Stand by assistance  End of Session PT - End of Session Equipment Utilized During Treatment: Gait belt Activity Tolerance: Patient tolerated treatment well Patient left: in chair;with call bell/phone within reach Nurse Communication: Mobility status;Patient requests pain meds  GP     Fauna Neuner 01/23/2013, 5:43 PM Pager [phone]

## 2013-01-23 NOTE — Plan of Care (Signed)
Problem: Phase III Progression Outcomes Goal: Time patient transferred to PCTU/Telemetry POD Outcome: Completed/Met Date Met:  01/23/13 Transfer to 2W23 ~ 1140 on 01/23/13

## 2013-01-23 NOTE — Procedures (Signed)
Objective Swallowing Evaluation: Modified Barium Swallowing Study  Patient Details  Name: Samuel [name] MRN: [id number] Date of Birth: 1927/01/19  Today's Date: 01/23/2013 Time: 1435-1500 SLP Time Calculation (min): 25 min  Past Medical History:  Past Medical History  Diagnosis Date  . Depression   . COPD (chronic obstructive pulmonary disease)   . Aortic stenosis     Echo 7/14:  Severe LVH, EF 55-60%, severe AS, mean gradient 78 mmHg, MAC, mild LAE  . Essential hypertension, benign   . Decreased libido   . DIZZINESS, CHRONIC   . Alcohol abuse   . Shortness of breath 10/24/11    "all the time"  . Arthritis     "in the low vertebra"  . Chronic lower back pain   . Scoliosis/kyphoscoliosis 10/28/2011  . Complication of anesthesia     NO NECK LINES ON RIGHT FOR SURGERY  . Heart murmur   . GERD (gastroesophageal reflux disease)     occ  . Abdominal aortic aneurysm 10/29/2011    3.7cm AAA by CT scan with unusual appearance suggestive of penetrating atherosclerotic ulcer or localized dissection  . CHF (congestive heart failure)   . 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 No past surgeries    . Transcatheter aortic valve replacement, transaortic N/A 01/21/2013    Procedure: TRANSCATHETER AORTIC VALVE REPLACEMENT, TRANSAORTIC;  Surgeon: Purcell Nails, MD;  Location: [facility] OR;  Service: Open Heart Surgery;  Laterality: N/A;  . Intraoperative transesophageal echocardiogram N/A 01/21/2013    Procedure: INTRAOPERATIVE TRANSESOPHAGEAL ECHOCARDIOGRAM;  Surgeon: Purcell Nails, MD;  Location: [facility] OR;  Service: Open Heart Surgery;  Laterality: N/A;  . Thoracotomy Right 01/21/2013    Procedure: MINI/LIMITED THORACOTOMY;  Surgeon: Purcell Nails, MD;  Location: [facility]  OR;  Service: Open Heart Surgery;  Laterality: Right;   HPI:  79 y/o male with history of severe symptomatic aortic stenosis. COPD, GERD,  and alcoholism. Admitted for electiveTRANSCATHETER AORTIC VALVE REPLACEMENT, TRANSAORTIC INTRAOPERATIVE TRANSESOPHAGEAL ECHOCARDIOGRAM  MINI/LIMITED THORACOTOMY.  C/o chronic difficulty swallowing, particularly with thin liquids and pills for years.       Assessment / Plan / Recommendation Clinical Impression  Dysphagia Diagnosis: Moderate cervical esophageal phase dysphagia;Mild pharyngeal phase dysphagia Clinical impression: Patient presents with a primary cervical esophageal dysphagia with a resultant pharyngeal component. Overall function characterized by decreased UES opening due to anterior curvature of cervical spine (and possible osteophytes) resulting in pharyngeal residuals which spill into the airway either during or after the swallow and result in penetration/aspiration with cough response. Initial cough moderately effective to clear airway. Cues for additional coughing successful to clear remaining aspirates. Therapeutic intervention including use of thickened liquids, various head postures, or supraglottic swallow ineffective to assist in improved function. Aspiration risk moderate with all pos, particularly liquids however can be decreased with use of aspiration precautions. Discussed with patient who is in agreement to continue a soft diet, thin liquids with known risk of aspiration. SLP will f/u for continued therapy with focus on use of compensatory strategies.     Treatment Recommendation  Therapy as outlined in treatment plan below    Diet Recommendation Dysphagia 3 (Mechanical Soft);Thin liquid   Liquid Administration via: Cup;No straw Medication Administration: Whole meds with puree Supervision: Patient able to self feed;Intermittent supervision to cue  for compensatory strategies Compensations: Slow rate;Small sips/bites;Multiple dry  swallows after each bite/sip;Clear throat intermittently Postural Changes and/or Swallow Maneuvers: Seated upright 90 degrees;Upright 30-60 min after meal    Other  Recommendations Oral Care Recommendations: Oral care BID   Follow Up Recommendations  None    Frequency and Duration min 2x/week  2 weeks     General HPI: 79 y/o male with history of severe symptomatic aortic stenosis. COPD, GERD,  and alcoholism. Admitted for electiveTRANSCATHETER AORTIC VALVE REPLACEMENT, TRANSAORTIC INTRAOPERATIVE TRANSESOPHAGEAL ECHOCARDIOGRAM  MINI/LIMITED THORACOTOMY.  C/o chronic difficulty swallowing, particularly with thin liquids and pills for years.   Type of Study: Modified Barium Swallowing Study Reason for Referral: Objectively evaluate swallowing function Previous Swallow Assessment: none Diet Prior to this Study: Regular;Thin liquids Temperature Spikes Noted: No Respiratory Status: Room air History of Recent Intubation: Yes Length of Intubations (days):  (for surgery only) Behavior/Cognition: Alert;Cooperative;Pleasant mood Oral Cavity - Dentition: Dentures, top;Dentures, bottom Oral Motor / Sensory Function: Within functional limits Self-Feeding Abilities: Able to feed self Patient Positioning: Upright in chair Baseline Vocal Quality: Clear Volitional Cough: Strong Volitional Swallow: Able to elicit Anatomy: Other (Comment) (anterior curvature of cervical spine) Pharyngeal Secretions: Not observed secondary MBS    Reason for Referral Objectively evaluate swallowing function   Oral Phase Oral Preparation/Oral Phase Oral Phase: WFL   Pharyngeal Phase Pharyngeal Phase Pharyngeal Phase: Impaired Pharyngeal - Nectar Pharyngeal - Nectar Cup: Pharyngeal residue - valleculae;Pharyngeal residue - pyriform sinuses;Penetration/Aspiration after swallow Penetration/Aspiration details (nectar cup): Material enters airway, CONTACTS cords and not ejected out Pharyngeal - Thin Pharyngeal - Thin  Cup: Delayed swallow initiation;Premature spillage to pyriform sinuses;Pharyngeal residue - valleculae;Pharyngeal residue - pyriform sinuses;Penetration/Aspiration before swallow;Penetration/Aspiration after swallow;Moderate aspiration Penetration/Aspiration details (thin cup): Material enters airway, passes BELOW cords and not ejected out despite cough attempt by patient (cleared with extra cues for hard cough) Pharyngeal - Solids Pharyngeal - Puree: Delayed swallow initiation;Premature spillage to valleculae;Pharyngeal residue - valleculae Pharyngeal - Mechanical Soft: Delayed swallow initiation;Premature spillage to valleculae;Pharyngeal residue - valleculae Pharyngeal - Pill: Delayed swallow initiation;Premature spillage to valleculae  Cervical Esophageal Phase    GO    Cervical Esophageal Phase Cervical Esophageal Phase: Impaired Cervical Esophageal Phase - Nectar Nectar Cup: Prominent cricopharyngeal segment;Reduced cricopharyngeal relaxation Cervical Esophageal Phase - Thin Thin Cup: Reduced cricopharyngeal relaxation;Prominent cricopharyngeal segment Cervical Esophageal Phase - Solids Puree: Reduced cricopharyngeal relaxation;Prominent cricopharyngeal segment Mechanical Soft: Reduced cricopharyngeal relaxation;Prominent cricopharyngeal segment Cervical Esophageal Phase - Comment Cervical Esophageal Comment: due to anterior curvature of cervical spine        Ferdinand Lango MA, CCC-SLP [phone]  Margert Edsall Meryl 01/23/2013, 3:36 PM

## 2013-01-23 NOTE — Evaluation (Signed)
Occupational Therapy Evaluation Patient Details Name: Samuel [name] MRN: [id number] DOB: 23-May-1926 Today's Date: 01/23/2013 Time: 2130-8657 OT Time Calculation (min): 24 min  OT Assessment / Plan / Recommendation History of present illness This 79 y.o. male admitted for TRANSCATHETER AORTIC VALVE REPLACEMENT, TRANSAORTIC with mini/limited thorocotomy   Clinical Impression   Pt admitted with above.  He is moving quite well at time of eval.  He requires mod A for LB ADLs due to difficulty moving sit to stand as needed to complete ADLs.  RHR 78; WHR 87 with dyspnea 3/4.  Anticipate good progress.  He lives alone at [facility]  Anticipate he will return to modified independence, but will likely need intermittent assist for activities such as grocery shopping, meal prep, laundry, etc.  If he does not have this level of assist available, he may need a short term SNF stay to allow him to improve endurance so that he may complete these tasks independently     OT Assessment  Patient needs continued OT Services    Follow Up Recommendations  Home health OT;Supervision - Intermittent    Barriers to Discharge Decreased caregiver support    Equipment Recommendations  None recommended by OT    Recommendations for Other Services    Frequency  Min 2X/week    Precautions / Restrictions Precautions Precautions: Fall   Pertinent Vitals/Pain     ADL  Eating/Feeding: Independent Where Assessed - Eating/Feeding: Chair Grooming: Wash/dry hands;Teeth care;Wash/dry face;Brushing hair;Min guard Where Assessed - Grooming: Supported standing Upper Body Bathing: Supervision/safety;Set up Where Assessed - Upper Body Bathing: Supported sitting;Unsupported sitting Lower Body Bathing: Moderate assistance Where Assessed - Lower Body Bathing: Supported sit to stand Upper Body Dressing: Supervision/safety;Set up Where Assessed - Upper Body Dressing: Unsupported sitting Lower Body Dressing: Moderate  assistance Where Assessed - Lower Body Dressing: Supported sit to Pharmacist, hospital: Moderate assistance Toilet Transfer Method: Sit to stand;Stand pivot Toilet Transfer Equipment: Comfort height toilet;Grab bars Toileting - Clothing Manipulation and Hygiene: Min guard Where Assessed - Glass blower/designer Manipulation and Hygiene: Standing Equipment Used: Rolling walker Transfers/Ambulation Related to ADLs: min guard assist.  Pt requires verbal cues for walker safety ADL Comments: Pt able to don/doff socks with no difficulty.  Dyspnea 3/4 with activity.  Mod A due to difficulty moving sit to stand    OT Diagnosis: Generalized weakness;Acute pain  OT Problem List: Decreased strength;Decreased activity tolerance;Impaired balance (sitting and/or standing);Decreased safety awareness;Cardiopulmonary status limiting activity;Pain OT Treatment Interventions: Self-care/ADL training;DME and/or AE instruction;Therapeutic activities;Patient/family education;Balance training   OT Goals(Current goals can be found in the care plan section) Acute Rehab OT Goals Patient Stated Goal: To regain strength OT Goal Formulation: With patient Time For Goal Achievement: 01/30/13 Potential to Achieve Goals: Good ADL Goals Pt Will Perform Grooming: with modified independence;standing Pt Will Perform Upper Body Bathing: with modified independence;sitting Pt Will Perform Lower Body Bathing: with modified independence;sit to/from stand Pt Will Perform Upper Body Dressing: with modified independence;sitting Pt Will Perform Lower Body Dressing: with modified independence;sit to/from stand Pt Will Transfer to Toilet: with modified independence;regular height toilet;ambulating;grab bars Pt Will Perform Toileting - Clothing Manipulation and hygiene: with modified independence;sit to/from stand Pt Will Perform Tub/Shower Transfer: Tub transfer;with modified independence;ambulating;grab bars;shower seat;rolling  walker  Visit Information  Last OT Received On: 01/23/13 Assistance Needed: +1 Reason Eval/Treat Not Completed: Medical issues which prohibited therapy History of Present Illness: This 79 y.o. male admitted for TRANSCATHETER AORTIC VALVE REPLACEMENT, TRANSAORTIC with mini/limited thorocotomy  Prior Functioning     Home Living Family/patient expects to be discharged to:: Private residence Living Arrangements: Alone Type of Home: Apartment Home Access: Elevator Home Layout: One level Home Equipment: Environmental consultant - 2 wheels;Walker - 4 wheels;Shower seat;Grab bars - toilet;Grab bars - tub/shower Additional Comments: Pt lives at [facility] Prior Function Level of Independence: Independent with assistive device(s) Comments: Pt ambulates with RW.  He reports he was able to independently drive, grocery shop, perform household management Communication Communication: No difficulties Dominant Hand: Right         Vision/Perception     Cognition  Cognition Arousal/Alertness: Awake/alert Behavior During Therapy: WFL for tasks assessed/performed    Extremity/Trunk Assessment Upper Extremity Assessment Upper Extremity Assessment: Overall WFL for tasks assessed Lower Extremity Assessment Lower Extremity Assessment: Defer to PT evaluation Cervical / Trunk Assessment Cervical / Trunk Assessment: Kyphotic     Mobility Bed Mobility Bed Mobility: Not assessed Transfers Transfers: Sit to Stand;Stand to Sit Sit to Stand: 3: Mod assist;With upper extremity assist;From chair/3-in-1 Stand to Sit: 4: Min assist;With upper extremity assist;To chair/3-in-1 Details for Transfer Assistance: assist standing from low surface, and min A to control descent when moving to sitting     Exercise     Balance Balance Balance Assessed: Yes Static Standing Balance Static Standing - Balance Support: Bilateral upper extremity supported Static Standing - Level of Assistance: 5: Stand by assistance    End of Session OT - End of Session Equipment Utilized During Treatment: Rolling walker Activity Tolerance: Patient limited by fatigue Patient left: in chair;with call bell/phone within reach Nurse Communication: Patient requests pain meds;Mobility status  GO     Taia Bramlett M 01/23/2013, 1:32 PM

## 2013-01-23 NOTE — Plan of Care (Signed)
Problem: Phase II Progression Outcomes Goal: Tolerates liquids without nausea/vomiting Outcome: Completed/Met Date Met:  01/23/13 Pt caugh with PO intake

## 2013-01-23 NOTE — Progress Notes (Signed)
Pt transferred to 2W23. Pt ambulated part of distance, and then was transferred via wheelchair. Pt on room air, on portable tele monitor. Tolerated transfer well. Pt wearing dentures and glasses on transfer, belongings to new pt room. Message left with daughter on new room assignment. Receiving RN present on arrival to new unit, pt placed on new units tele monitor. Will continue to monitor. Koren Bound

## 2013-01-23 NOTE — Progress Notes (Signed)
OT Cancellation Note  Patient Details Name: Samuel [name] MRN: [id number] DOB: 1926-12-12   Cancelled Treatment:    Reason Eval/Treat Not Completed: Medical issues which prohibited therapy - pt on bedrest due to removal of IJ line, will check back this pm  Jeani Hawking M [phone] 01/23/2013, 10:48 AM

## 2013-01-24 ENCOUNTER — Inpatient Hospital Stay (HOSPITAL_COMMUNITY): Payer: Medicare Other

## 2013-01-24 ENCOUNTER — Encounter (HOSPITAL_COMMUNITY): Payer: Medicare Other | Admitting: Thoracic Surgery (Cardiothoracic Vascular Surgery)

## 2013-01-24 ENCOUNTER — Encounter (HOSPITAL_COMMUNITY): Payer: Medicare Other | Admitting: Cardiovascular Disease

## 2013-01-24 LAB — BASIC METABOLIC PANEL
BUN: 21 mg/dL (ref 6–23)
Calcium: 7.8 mg/dL — ABNORMAL LOW (ref 8.4–10.5)
Chloride: 102 mEq/L (ref 96–112)
GFR calc non Af Amer: 65 mL/min — ABNORMAL LOW (ref >90)
Glucose, Bld: 86 mg/dL (ref 70–99)
Potassium: 4 mEq/L (ref 3.5–5.1)
Sodium: 138 mEq/L (ref 135–145)

## 2013-01-24 LAB — TYPE AND SCREEN
Antibody Screen: NEGATIVE
Unit division: 0
Unit division: 0
Unit division: 0

## 2013-01-24 LAB — CBC
HCT: 30 % — ABNORMAL LOW (ref 39.0–52.0)
Hemoglobin: 10.5 g/dL — ABNORMAL LOW (ref 13.0–17.0)
RBC: 3.16 MIL/uL — ABNORMAL LOW (ref 4.22–5.81)
RDW: 13.7 % (ref 11.5–15.5)
WBC: 4.8 10*3/uL (ref 4.0–10.5)

## 2013-01-24 LAB — GLUCOSE, CAPILLARY: Glucose-Capillary: 81 mg/dL (ref 70–99)

## 2013-01-24 MED ORDER — AMLODIPINE BESYLATE 10 MG PO TABS: 10.0000 mg | ORAL_TABLET | Freq: Every day | ORAL | Status: DC

## 2013-01-24 MED ORDER — OXYCODONE HCL 5 MG PO TABS: 5.0000 mg | ORAL_TABLET | ORAL | Status: DC | PRN

## 2013-01-24 MED ORDER — METOPROLOL TARTRATE 25 MG PO TABS: 12.5000 mg | ORAL_TABLET | Freq: Two times a day (BID) | ORAL | Status: DC

## 2013-01-24 MED ORDER — ASPIRIN 81 MG PO TBEC: 81.0000 mg | DELAYED_RELEASE_TABLET | Freq: Every day | ORAL | Status: DC

## 2013-01-24 MED ORDER — CLOPIDOGREL BISULFATE 75 MG PO TABS: 75.0000 mg | ORAL_TABLET | Freq: Every day | ORAL | Status: DC

## 2013-01-24 MED ORDER — TRAMADOL HCL 50 MG PO TABS: 50.0000 mg | ORAL_TABLET | Freq: Four times a day (QID) | ORAL | Status: DC | PRN

## 2013-01-24 MED ORDER — POTASSIUM CHLORIDE ER 20 MEQ PO TBCR: 20.0000 meq | EXTENDED_RELEASE_TABLET | Freq: Every day | ORAL | Status: DC

## 2013-01-24 NOTE — Progress Notes (Signed)
Clinical Social Worker facilitated patient discharge by contacting the patient and facility, [facility]. Patient agreeable to this plan and arranging transport via EMS . CSW will sign off, as social work intervention is no longer needed.  Lorrayne Ismael, MSW, LCSWA [phone]  

## 2013-01-24 NOTE — Progress Notes (Signed)
Clinical Social Work Department CLINICAL SOCIAL WORK PLACEMENT NOTE 01/24/2013  Patient:  Samuel [name], Samuel [name]  Account Number:  [id number] Admit date:  01/21/2013  Clinical Social Worker:  Carren Rang  Date/time:  01/24/2013 10:56 AM  Clinical Social Work is seeking post-discharge placement for this patient at the following level of care:   SKILLED NURSING   (*CSW will update this form in Epic as items are completed)   01/24/2013  Patient/family provided with [facility] Department of Clinical Social Work's list of facilities offering this level of care within the geographic area requested by the patient (or if unable, by the patient's family).  01/24/2013  Patient/family informed of their freedom to choose among providers that offer the needed level of care, that participate in Medicare, Medicaid or managed care program needed by the patient, have an available bed and are willing to accept the patient.  01/24/2013  Patient/family informed of [facility]' ownership interest in [facility], as well as of the fact that they are under no obligation to receive care at this facility.  PASARR submitted to EDS on 01/24/2013 PASARR number received from EDS on 01/24/2013  FL2 transmitted to all facilities in geographic area requested by pt/family on  01/24/2013 FL2 transmitted to all facilities within larger geographic area on   Patient informed that his/her managed care company has contracts with or will negotiate with  certain facilities, including the following:     Patient/family informed of bed offers received:   Patient chooses bed at  Physician recommends and patient chooses bed at    Patient to be transferred to  on   Patient to be transferred to facility by   The following physician request were entered in Epic:   Additional Comments:  Maree Krabbe, MSW, [employer] [phone]

## 2013-01-24 NOTE — Progress Notes (Signed)
Rehab Admissions Coordinator Note:  Patient was screened by Samuel [name] for appropriateness for an Inpatient Acute Rehab Consult.  At this time, we are recommending Skilled Nursing Facility.  Samuel [name] 01/24/2013, 1:12 PM  I can be reached at [phone].

## 2013-01-24 NOTE — Discharge Summary (Signed)
Physician Discharge Summary  Patient ID: BRICK KETCHER MRN: [id number] DOB/AGE: [birth year] 79 y.o.  Admit date: 01/21/2013 Discharge date: 01/24/2013  Admission Diagnoses:  Patient Active Problem List   Diagnosis Date Noted  . Aortic stenosis, severe 11/20/2011  . Abdominal aortic aneurysm 10/29/2011  . Scoliosis/kyphoscoliosis 10/28/2011  . Alcohol intoxication 10/24/2011  . Physical deconditioning 10/24/2011  . Generalized weakness 10/24/2011  . Altered mental status 10/24/2011  . Fever and chills 10/24/2011  . Dysphagia 10/24/2011  . Essential hypertension, benign 03/04/2010  . AORTIC STENOSIS 09/03/2008  . COPD 09/03/2008  . DEPRESSION 07/19/2007  . CELLULITIS AND ABSCESS OF FOOT EXCEPT TOES 07/19/2007  . DIZZINESS, CHRONIC 07/11/2007  . DECREASED LIBIDO 07/11/2007  . ALCOHOL ABUSE 07/08/2007  . HEADACHE 07/08/2007  . HEART MURMUR 07/11/2006  . DYSPNEA ON EXERTION 07/11/2006  . RLQ PAIN 07/11/2006   Discharge Diagnoses:   Patient Active Problem List   Diagnosis Date Noted  . 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 Aortic stenosis, severe 11/20/2011  . Abdominal aortic aneurysm 10/29/2011  . Scoliosis/kyphoscoliosis 10/28/2011  . Alcohol intoxication 10/24/2011  . Physical deconditioning 10/24/2011  . Generalized weakness 10/24/2011  . Altered mental status 10/24/2011  . Fever and chills 10/24/2011  . Dysphagia 10/24/2011  . Essential hypertension, benign 03/04/2010  . AORTIC STENOSIS 09/03/2008  . COPD 09/03/2008  . DEPRESSION 07/19/2007  . CELLULITIS AND ABSCESS OF FOOT EXCEPT TOES 07/19/2007  . DIZZINESS, CHRONIC 07/11/2007  . DECREASED LIBIDO 07/11/2007  . ALCOHOL ABUSE 07/08/2007  . HEADACHE 07/08/2007  . HEART MURMUR 07/11/2006  . DYSPNEA ON EXERTION 07/11/2006  . RLQ PAIN 07/11/2006   Discharged Condition: good  History of Present Illness:   Samuel [name] is an 79 year old male who has been followed for several years by  Dr. Jens Som with known history of severe symptomatic aortic stenosis and alcoholism. The patient has historically remained adamantly opposed to any considerations for elective aortic valve replacement. The patient was initially evaluated by Dr. Cornelius Moras in July of 2013 when he was hospitalized for congestive heart failure. At that time he continued to resist any thoughts regarding possible elective aortic valve replacement. The patient reports that over the last year he has gone downhill considerably. He gets short of breath with minimal physical activity and occasionally at rest, consistent with chronic combined systolic and diastolic CHF, functional class III . He reports that he is feeling weaker and weaker. He has frequent dizzy spells without syncope. He reports only occasional mild, transient chest discomfort. He denies PND, orthopnea, or lower extremity edema. As his breathing has deteriorated over the past few months he has reconsidered the possibility of considering elective surgical intervention. He was seen recently by Tereso Newcomer and a follow up echocardiogram was performed confirming further progression in the severity of aortic stenosis. Peak velocity across the aortic valve now measures 5.6 m/s corresponding to peak and mean transvalvular gradients estimated 127 and 78 mm mercury respectively. Left ventricular systolic function remains reasonably well preserved with ejection fraction estimated 55-60%. Diagnostic cardiac catheterization performed July of 2013 was notable for the absence of significant coronary artery disease. Right heart catheterization was not performed at that time. The patient was seen by Dr. Excell Seltzer who discussed options with him further on 11/08/2012. At that time the patient's biggest complaint was of left hip pain which he states began after he had to lie on his left side when he underwent follow up echocardiogram. Plain film x-rays were obtained which showed mild  degenerative  change in both hips, right more so than the left. Dr. Excell Seltzer did not feel that the patient needed repeat cardiac catheterization before proceeding with definitive treatment for his severe aortic stenosis. Due to the severe tortuosity of the aorta and the presence of small infrarenal abdominal aortic aneurysm, the patient would not be candidate for transcatheter aortic valve replacement via transfemoral approach.  The patient was again evaluated by Dr. Cornelius Moras on 01/08/2013 at which time it was felt the patient would benefit from Transcatheter Aortic Valve replacement via direct Aortic Approach.  The risks and benefits of the procedure were explained to the patient and he was agreeable to proceed.   Hospital Course:   The patient presented to [facility] on 01/21/2013.  He was taken to the operating room and underwent Transcatheter Aortic Valve Replacement via Transaortic Approach via Right Anterior Mini Thoracotomy.  This was done utilizing a 29 mm Edwards Sapien XT THV valve prosthesis.  The patient tolerated the procedure well and was taken to the SICU in stable condition.  The patient was extubated on POD #1.  During his stay in the ICU the patient was hypertensive requiring use of IV Nitroglycerin.  This was weaned down as patients pressure control improved.  The patient was maintaining Sinus rhythm but was bradycardic.  The patient had issues with dysphagia and was evaluated by Speech and Swallow therapy who recommended the patient be placed on a dysphagia 3 diet.  The patient was medically stable and transferred to the step down unit.  The patient continues to progress.  He continues to maintain NSR.  He is ambulating with assistance.  It was felt the patient would benefit from SNF placement.  He is medically stable and should no further issues arise we anticipate discharge to SNF once bed becomes available next 24-48 hours.  He will follow up with Dr. Cornelius Moras on 02/17/2013 with an Echocardiogram prior to  his appointment.      Consults: cardiology  Significant Diagnostic Studies:   Study Conclusions  INTRA OPERATIVE TEE  - Left ventricle: Hypertrophy was noted. Systolic function was normal. The estimated ejection fraction was in the range of 55% to 60%. - Aortic valve: Pre TAVR: Severe AS and severe AR. Heavily calcified including annulus and intervalvular fibrosa. No effusion. Mild MR. No LAA thrombus  Post TAVR: Sapien 29mm XT valve in excellent position. Normal pliable leaflet motion. Gradient less than 5 mmHg. Grade 2 perivalvular regurgitation. Seen near lateral margin of intervalvular fibrosa over an area of dense calcification. Located at 12:00 on shor axis imaging. EF remains normal with no effusion and only trivial residual MR - Mitral valve: Mild regurgitation. - Left atrium: The atrium was dilated. - Right atrium: No evidence of thrombus in the atrial cavity or appendage. - Atrial septum: No defect or patent foramen ovale was identified. Echo contrast study showed no right-to-left atrial level shunt, following an increase in RA pressure induced by provocative maneuvers. - Pulmonic valve: No evidence of vegetation.   POST OPERATIVE ECHO: - Left ventricle: The cavity size was normal. Wall thickness was increased in a pattern of mild LVH. The estimated ejection fraction was 55%. Wall motion was normal; there were no regional wall motion abnormalities. Doppler parameters are consistent with abnormal left ventricular relaxation (grade 1 diastolic dysfunction). - Aortic valve: The TAVR valve is stable. There may be mild AI. There does not appear to be a significant gradient. - Mitral valve: Moderately calcified annulus. - Right ventricle:  The cavity size was mildly dilated. - Pulmonary arteries: PA peak pressure: 37mm Hg (S).  Treatments: surgery:   Transcatheter Aortic Valve Replacement - Transaortic Approach via Right Anterior Mini Thoracotomy Edwards Sapien  XT THV (size 29 mm, model # 9300TFX, serial # [id number])  Disposition: 01-Home or Self Care  Discharge Medications:     Medication List    STOP taking these medications       ibuprofen 200 MG tablet  Commonly known as:  ADVIL,MOTRIN     mupirocin ointment 2 %  Commonly known as:  BACTROBAN      TAKE these medications       ALPRAZolam 0.5 MG tablet  Commonly known as:  XANAX  Take 0.5 mg by mouth 2 (two) times daily as needed for anxiety.     amLODipine 10 MG tablet  Commonly known as:  NORVASC  Take 10 mg by mouth daily.     aspirin 81 MG EC tablet  Take 1 tablet (81 mg total) by mouth daily.     clopidogrel 75 MG tablet  Commonly known as:  PLAVIX  Take 1 tablet (75 mg total) by mouth daily with breakfast.     ESTER C PO  Take 1,000 mg by mouth daily.     FISH OIL PO  Take 1 capsule by mouth daily.     furosemide 20 MG tablet  Commonly known as:  LASIX  Take 10 mg by mouth daily.     metoprolol tartrate 25 MG tablet  Commonly known as:  LOPRESSOR  Take 0.5 tablets (12.5 mg total) by mouth 2 (two) times daily.     multivitamin with minerals Tabs tablet  Take 1 tablet by mouth daily.     OVER THE COUNTER MEDICATION  Take 30-60 mLs by mouth daily as needed (sinus congestion). Colloidal silver     oxyCODONE 5 MG immediate release tablet  Commonly known as:  Oxy IR/ROXICODONE  Take 1-2 tablets (5-10 mg total) by mouth every 3 (three) hours as needed.     POTASSIUM PO  Take 1 tablet by mouth daily.     SELENIUM PO  Take 1 tablet by mouth daily.     traMADol 50 MG tablet  Commonly known as:  ULTRAM  Take 50 mg by mouth 2 (two) times daily as needed for pain.     vitamin B-12 100 MCG tablet  Commonly known as:  CYANOCOBALAMIN  Take 50 mcg by mouth daily.     VITAMIN B-12 SL  Place 1 tablet under the tongue daily.     ZINC PO  Take 1 tablet by mouth daily.       The patient has been discharged on:   1.Beta Blocker:  Yes [ x  ]                               No   [   ]                              If No, reason:  2.Ace Inhibitor/ARB: Yes [x   ]                                     No  [    ]  If No, reason:  3.Statin:   Yes [   ]                  No  [ x  ]                  If No, reason: No Hyperlipidemia  4.Marlowe KaysValentino Hue  [ x  ]                  No   [   ]                  If No, reason:      Future Appointments Provider Department Dept Phone   02/14/2013 11:00 AM [facility] ECHO LAB [phone]   02/17/2013 4:30 PM Purcell Nails, MD [facility] [phone]     Signed: Lowella Dandy 01/24/2013, 9:29 AM

## 2013-01-24 NOTE — Progress Notes (Signed)
Clinical Social Work Department CLINICAL SOCIAL WORK PLACEMENT NOTE 01/24/2013  Patient:  Samuel [name], Samuel [name]  Account Number:  [id number] Admit date:  01/21/2013  Clinical Social Worker:  Carren Rang  Date/time:  01/24/2013 10:56 AM  Clinical Social Work is seeking post-discharge placement for this patient at the following level of care:   SKILLED NURSING   (*CSW will update this form in Epic as items are completed)   01/24/2013  Patient/family provided with [facility] Department of Clinical Social Work's list of facilities offering this level of care within the geographic area requested by the patient (or if unable, by the patient's family).  01/24/2013  Patient/family informed of their freedom to choose among providers that offer the needed level of care, that participate in Medicare, Medicaid or managed care program needed by the patient, have an available bed and are willing to accept the patient.  01/24/2013  Patient/family informed of [facility]' ownership interest in [facility], as well as of the fact that they are under no obligation to receive care at this facility.  PASARR submitted to EDS on 01/24/2013 PASARR number received from EDS on 01/24/2013  FL2 transmitted to all facilities in geographic area requested by pt/family on  01/24/2013 FL2 transmitted to all facilities within larger geographic area on   Patient informed that his/her managed care company has contracts with or will negotiate with  certain facilities, including the following:     Patient/family informed of bed offers received:  01/24/2013 Patient chooses bed at [facility],  Physician recommends and patient chooses bed at    Patient to be transferred to [facility],  on  01/24/2013 Patient to be transferred to facility by EMS  The following physician request were entered in Epic:   Additional Comments:  Maree Krabbe, MSW,  [employer] [phone]

## 2013-01-24 NOTE — Progress Notes (Addendum)
1:39pm: Patient now agreeable to SNF placement according to treatment team. CSW will facilitate dc for today.  CSW spoke to patient about bed offers. Patient alert and oriented x4. CSW gave SNF options to patient and patient is not agreeable for SNF placement at this time. CSW notified CM and MD concerning patient desires. Based on patient statement, CSW explained recommendations again and validated that patient truly does not want to go to SNF placement at this time.  Maree Krabbe, MSW, Theresia Majors [phone]

## 2013-01-24 NOTE — Progress Notes (Signed)
Speech Language Pathology Treatment:    Patient Details Name: Samuel [name] MRN: [id number] DOB: 09-07-1926 Today's Date: 01/24/2013 Time: 4098-1191 SLP Time Calculation (min): 15 min  Assessment / Plan / Recommendation Clinical Impression  Pt seen during lunch.  No overt s/s aspiration observed during meal.  Reviewed precautions with pt, and encouraged him to take the sheet home with him at DC.     HPI HPI: 79 y/o male with history of severe symptomatic aortic stenosis. COPD, GERD,  and alcoholism. Admitted for electiveTRANSCATHETER AORTIC VALVE REPLACEMENT, TRANSAORTIC INTRAOPERATIVE TRANSESOPHAGEAL ECHOCARDIOGRAM  MINI/LIMITED THORACOTOMY.  C/o chronic difficulty swallowing, particularly with thin liquids and pills for years.     Pertinent Vitals VSS  SLP Plan   Continue to monitor diet tolerance acutely.   Recommendations Diet recommendations: Dysphagia 3 (mechanical soft);Thin liquid Liquids provided via: Cup Medication Administration: Whole meds with puree Supervision: Patient able to self feed;Intermittent supervision to cue for compensatory strategies Compensations: Slow rate;Small sips/bites;Multiple dry swallows after each bite/sip;Clear throat intermittently Postural Changes and/or Swallow Maneuvers: Seated upright 90 degrees;Upright 30-60 min after meal                   GO   Celia B. Murvin Natal [facility], CCC-SLP [phone]  Leigh Aurora 01/24/2013, 12:43 PM

## 2013-01-24 NOTE — Progress Notes (Signed)
Occupational Therapy Treatment Patient Details Name: Samuel [name] MRN: [id number] DOB: 06-16-1926 Today's Date: 01/24/2013 Time: 7829-5621 OT Time Calculation (min): 35 min  OT Assessment / Plan / Recommendation  History of present illness This 79 y.o. male admitted for TRANSCATHETER AORTIC VALVE REPLACEMENT, TRANSAORTIC with mini/limited thorocotomy   OT comments  Progressing nicely and participating in BADL tasks without reports of pain or fatigue.  Patient overall supervision/set up for toilet/chair/bed transfers, functional mobility using RW for BADL tasks within his hospital room, sponge bath at sink in stand and sit for feet. Patient did not dress into street clothes or donn shoes. Occasional vcs for safety.  Patient active PTA with desire to return home.   Follow Up Recommendations  Home health OT vs SNF;Supervision/24 hr-Supervision-Intermittent, recommend CIR as option for returning home at an overall Mod I level and HH therapy.   Barriers to Discharge  Decreased caregiver support    Equipment Recommendations  None recommended by OT    Frequency Min 2X/week   Precautions / Restrictions Precautions Precautions: Fall   Pertinent Vitals/Pain No reports of pain    ADL  Grooming: Performed;Set up Where Assessed - Grooming: Unsupported standing;Supported standing Upper Body Bathing: Set up;Performed Where Assessed - Upper Body Bathing: Supported standing;Unsupported standing Lower Body Bathing: Performed;Set up Where Assessed - Lower Body Bathing: Unsupported sitting;Supported standing;Unsupported standing;Supported sit to stand Upper Body Dressing: Set up;Performed Where Assessed - Upper Body Dressing: Supported standing;Unsupported standing Lower Body Dressing: Performed;Set up Where Assessed - Lower Body Dressing: Supported sitting;Unsupported sitting;Supported standing;Unsupported standing;Supported sit to stand Toilet Transfer: Journalist, newspaper Method: Sit to stand;Stand pivot Acupuncturist: Regular height toilet;Grab bars Toileting - Clothing Manipulation and Hygiene: Performed;Supervision/safety Where Assessed - Engineer, mining and Hygiene: Standing;Sit to stand from 3-in-1 or toilet Transfers/Ambulation Related to ADLs: Supervision with RW to ambulate to and from bathroom, Supervision for transfers ADL Comments: patient overall supervision/set up with all BADL tasks.  Donned hospital gown and hospital gripper socks.  Declined to don street clothes.    OT Diagnosis: Generalized weakness  OT Problem List: Decreased strength;Decreased activity tolerance;Impaired balance (sitting and/or standing);Decreased safety awareness;Cardiopulmonary status limiting activity OT Treatment Interventions: Self-care/ADL training;DME and/or AE instruction;Therapeutic activities;Patient/family education;Balance training;Energy conservation   OT Goals(current goals can now be found in the care plan section) Acute Rehab OT Goals Patient Stated Goal: Get home and care for my little dog Potential to Achieve Goals: Good  Visit Information  Last OT Received On: 01/24/13 Assistance Needed: +1 History of Present Illness: This 79 y.o. male admitted for TRANSCATHETER AORTIC VALVE REPLACEMENT, TRANSAORTIC with mini/limited thorocotomy    Cognition  Cognition Arousal/Alertness: Awake/alert Behavior During Therapy: WFL for tasks assessed/performed (easily distracted) Overall Cognitive Status: Within Functional Limits for tasks assessed    Mobility  Transfers Transfers: Sit to Stand;Stand to Sit Sit to Stand: 5: Supervision Stand to Sit: 5: Supervision Details for Transfer Assistance: occasional cues for safety related to walker safety and cues to remove leg rest/foot plate of w/c BEFORE attempt to stand.  Unsure this will be a problem after d/c since patient is an ambulator.    Balance Static Standing Balance Static  Standing - Balance Support: Bilateral upper extremity supported Static Standing - Level of Assistance: 5: Stand by assistance   End of Session OT - End of Session Equipment Utilized During Treatment: Rolling walker Activity Tolerance: Patient tolerated treatment well Patient left: with call bell/phone within reach;in bed;with bed alarm set Nurse Communication: Mobility status (  completion of toileting and bath)  GO     [name] 01/24/2013, 10:40 AM

## 2013-01-24 NOTE — Progress Notes (Signed)
CARDIAC REHAB PHASE I   PRE:  Rate/Rhythm: 67 SR  BP:  Supine: 120/50  Sitting:    Standing:    SaO2: 94 RA  MODE:  Ambulation: 350 ft   POST:  Rate/Rhythm78 SR  BP:  Supine:   Sitting: 132/60  Standing:    SaO2: 92 RA Tolerated ambulation well with assistance x 1 and a rolling walker.  Education completed for post-op cardiac surgery.  No family was present, needs reinforcement, not able to teach back most of education.  To skilled nursing facility at 1600. [phone]. Cindra Eves RN, BSN 01/24/2013 2:56 PM  67 SR

## 2013-01-24 NOTE — Progress Notes (Signed)
CT sutures removed per order.  Site painted, steri's applied.  Transport arrives now to take Pt to SNF.

## 2013-01-24 NOTE — Progress Notes (Signed)
Pt in XRay this am - talked to him briefly in the hallway. He has made excellent progress after TAVR via transaortic approach, now POD #3. meds and labs reviewed. Platelet count is stable. Dispo per Dr Cornelius Moras. Tele reviewed and shows sinus rhythm without arrhythmia.   Samuel [name] 01/24/2013 7:24 AM

## 2013-01-24 NOTE — Progress Notes (Signed)
Clinical Social Work Department BRIEF PSYCHOSOCIAL ASSESSMENT 01/24/2013  Patient:  Samuel [name], Samuel [name]     Account Number:  [id number]     Admit date:  01/21/2013  Clinical Social Worker:  Carren Rang  Date/Time:  01/24/2013 10:46 AM  Referred by:  Physician  Date Referred:  01/24/2013 Referred for  SNF Placement   Other Referral:   Interview type:  Patient Other interview type:    PSYCHOSOCIAL DATA Living Status:  ALONE Admitted from facility:   Level of care:   Primary support name:   Primary support relationship to patient:   Degree of support available:    CURRENT CONCERNS Current Concerns  Post-Acute Placement   Other Concerns:    SOCIAL WORK ASSESSMENT / PLAN Clinical Social Worker received referral for SNF placement at d/c. CSW introduced self and explained reason for visit. CSW explained SNF process to patient. Patient stated he does not want to go to SNF-but needs to go for a few days for his health.   CSW will complete FL2 for MD's signature and will update patient  when bed offers are received.   Assessment/plan status:  Psychosocial Support/Ongoing Assessment of Needs Other assessment/ plan:   Information/referral to community resources:   snf list    PATIENT'S/FAMILY'S RESPONSE TO PLAN OF CARE: Patient is now agreeable for SNF       Maree Krabbe, MSW, [employer] [phone]

## 2013-01-24 NOTE — Progress Notes (Addendum)
      [address]       [name] [address]             [phone]      3 Days Post-Op Procedure(s) (LRB): TRANSCATHETER AORTIC VALVE REPLACEMENT, TRANSAORTIC (N/A) INTRAOPERATIVE TRANSESOPHAGEAL ECHOCARDIOGRAM (N/A) MINI/LIMITED THORACOTOMY (Right)  Subjective:  [name] states he is feeling much better.  He states he is breathing much better since surgery. + ambulation with assistance  Objective: Vital signs in last 24 hours: Temp:  [97.6 F (36.4 C)-98.6 F (37 C)] 98.1 F (36.7 C) (10/24 0625) Pulse Rate:  [69-81] 69 (10/24 0625) Cardiac Rhythm:  [-] Normal sinus rhythm (10/23 2200) Resp:  [18-22] 20 (10/24 0625) BP: (108-138)/(48-62) 138/53 mmHg (10/24 0625) SpO2:  [92 %-99 %] 92 % (10/24 0625) Weight:  [154 lb (69.854 kg)] 154 lb (69.854 kg) (10/24 0500)  Intake/Output from previous day: 10/23 0701 - 10/24 0700 In: 570 [P.O.:520; IV Piggyback:50] Out: 500 [Urine:500]  General appearance: alert, cooperative and no distress Heart: regular rate and rhythm Lungs: clear to auscultation bilaterally Abdomen: soft, non-tender; bowel sounds normal; no masses,  no organomegaly Extremities: edema none appreciated Wound: clean and dry  Lab Results:  Recent Labs  01/23/13 0500 01/24/13 0545  WBC 6.2 4.8  HGB 10.5* 10.5*  HCT 31.0* 30.0*  PLT 75* 77*   BMET:  Recent Labs  01/23/13 0500 01/24/13 0545  NA 136 138  K 4.3 4.0  CL 103 102  CO2 27 29  GLUCOSE 126* 86  BUN 20 21  CREATININE 1.01 1.03  CALCIUM 7.7* 7.8*    PT/INR:  Recent Labs  01/21/13 1629  LABPROT 16.7*  INR 1.39   ABG    Component Value Date/Time   PHART 7.380 01/22/2013 0239   HCO3 25.1* 01/22/2013 0239   TCO2 24 01/22/2013 1808   ACIDBASEDEF 3.0* 01/22/2013 0133   O2SAT 98.0 01/22/2013 0239   CBG (last 3)   Recent Labs  01/22/13 2300 01/23/13 0422 01/23/13 0755  GLUCAP 119* 117* 94    Assessment/Plan: S/P Procedure(s) (LRB): TRANSCATHETER AORTIC  VALVE REPLACEMENT, TRANSAORTIC (N/A) INTRAOPERATIVE TRANSESOPHAGEAL ECHOCARDIOGRAM (N/A) MINI/LIMITED THORACOTOMY (Right)  1. CV- NSR bradycardic, pressures mildly elevated in upper 130s-  Continue Lopressor, Plavix, restart home Norvasc 2. Pulm- off oxygen, no acute issues continue IS 3. Renal- creatinine WNL, not volume overloaded 4. Dysphagia- SSLP recs dysphagia 3 diet, will reassess 5. Deconditioning- patient lives alone, however he is refusing SNF placement, states he wishes to go home and has help readily available if necessary 6. Dispo- patient stable, maintaining NSR, add Norvasc for BP, possible d/c over the weekend   LOS: 3 days    BARRETT, ERIN 01/24/2013  I have seen and examined the patient and agree with the assessment and plan as outlined.  Dysphagia is chronic and related to extreme distortion of the esophagus due to kyphoscoliosis.  There is nothing to do about this.  Overall Mr Schweitzer looks remarkably good.  I have reminded Mr Gettel that it would be unwise and potentially unsafe for him to go home without 24 hour/day assistance.  Apparently his daughter must go to work next week, so it's unlikely that she could provide adequate assistance.  His biggest concern has been regarding his dog.  He is now agreeable to short term placement in SNF for rehab.  He is ready for d/c today.  OWEN,CLARENCE H 01/24/2013 9:37 AM

## 2013-01-24 NOTE — Progress Notes (Signed)
Physical Therapy Treatment Patient Details Name: Samuel [name] MRN: [id number] DOB: 03/09/1927 Today's Date: 01/24/2013 Time: 1100-1115 PT Time Calculation (min): 15 min  PT Assessment / Plan / Recommendation  History of Present Illness This 79 y.o. male admitted for TRANSCATHETER AORTIC VALVE REPLACEMENT, TRANSAORTIC with mini/limited thorocotomy   PT Comments   **Pt progressing with mobility, increased gait distance today. Steady while walking with RW. Some verbal cues for posture and safety with RW required. *  Follow Up Recommendations  Supervision - Intermittent;SNF     Does the patient have the potential to tolerate intense rehabilitation     Barriers to Discharge        Equipment Recommendations  None recommended by PT    Recommendations for Other Services    Frequency Min 3X/week   Progress towards PT Goals Progress towards PT goals: Progressing toward goals  Plan Current plan remains appropriate    Precautions / Restrictions Precautions Precautions: Fall   Pertinent Vitals/Pain *pt denied pain HR 83 with walking**    Mobility  Bed Mobility Bed Mobility: Supine to Sit Supine to Sit: 6: Modified independent (Device/Increase time);With rails Transfers Sit to Stand: 5: Supervision Stand to Sit: 5: Supervision Details for Transfer Assistance: occasional cues for safety related to walker safety. Ambulation/Gait Ambulation/Gait Assistance: 4: Min guard Ambulation Distance (Feet): 350 Feet Assistive device: Rolling walker Ambulation/Gait Assistance Details: VCs for flexed posture, pt able to stand erect with cuing; pushed RW too far in front of him but states this is what is comfortable for him. No LOB. Gait Pattern: Step-through pattern;Trunk flexed;Decreased stride length    Exercises     PT Diagnosis:    PT Problem List:   PT Treatment Interventions:     PT Goals (current goals can now be found in the care plan section) Acute Rehab PT Goals Patient  Stated Goal: Get home and care for my little dog PT Goal Formulation: With patient Time For Goal Achievement: 01/30/13 Potential to Achieve Goals: Good  Visit Information  Last PT Received On: 01/24/13 Assistance Needed: +1 History of Present Illness: This 79 y.o. male admitted for TRANSCATHETER AORTIC VALVE REPLACEMENT, TRANSAORTIC with mini/limited thorocotomy    Subjective Data  Patient Stated Goal: Get home and care for my little dog   Cognition  Cognition Arousal/Alertness: Awake/alert Behavior During Therapy: WFL for tasks assessed/performed (easily distracted) Overall Cognitive Status: Within Functional Limits for tasks assessed    Balance  Balance Balance Assessed: Yes Static Standing Balance Static Standing - Balance Support: Bilateral upper extremity supported Static Standing - Level of Assistance: 5: Stand by assistance  End of Session PT - End of Session Activity Tolerance: Patient tolerated treatment well Patient left: in chair;with call bell/phone within reach Nurse Communication: Mobility status   GP     [name] 01/24/2013, 11:23 AM [phone]

## 2013-01-24 NOTE — Care Management Note (Signed)
    Page 1 of 2   01/24/2013     4:24:31 PM   CARE MANAGEMENT NOTE 01/24/2013  Patient:  Samuel [name], Samuel [name]   Account Number:  [id number]  Date Initiated:  01/24/2013  Documentation initiated by:  Callie Facey  Subjective/Objective Assessment:   PT ADM S/P TAVR ON 01/21/13.  PTA, PT INDEPENDENT OF ADLS AND LIVES ALONE AND LIVES AT [facility].     Action/Plan:   MD ADVISING SNF PLACEMENT, AS HE FEELS PT SHOULD NOT GO HOME ALONE.  PT RELUCTANT, BUT AGREEABLE TO THIS PLAN.  CSW CONSULTED TO FACILITATE DC TO SNF WHEN STABLE.   Anticipated DC Date:  01/24/2013   Anticipated DC Plan:  SKILLED NURSING FACILITY  In-house referral  Clinical Social Worker      DC Planning Services  CM consult      Choice offered to / List presented to:             Status of service:  Completed, signed off Medicare Important Message given?   (If response is "NO", the following Medicare IM given date fields will be blank) Date Medicare IM given:   Date Additional Medicare IM given:    Discharge Disposition:  SKILLED NURSING FACILITY  Per UR Regulation:  Reviewed for med. necessity/level of care/duration of stay  If discussed at Long Length of Stay Meetings, dates discussed:    Comments:  01/24/13 Mckenna Gamm,RN,BSN [phone] PT DISCHARGING TO SNF TODAY, PER CSW ARRANGEMENTS.  01/24/13 Zalma Channing,RN,BSN [phone]  PT NOT AGREEABLE TO PLAN FOR SNF PLACEMENT UPON CONVERSATION WITH SNF EARLIER TODAY.  MD IN PT ROOM WITH THIS CASE MANAGER.  MD EXPLAINED REASONING FOR SNF PLACEMENT AS PT LIVES ALONE, AND MAY NEED SOME ASSISTANCE AND REHAB UPON DC.  PT VERBALIZES UNDERSTANDING OF THIS, AND STATES HE REALIZES THIS IS A SAFER PLAN.  WILL NOTIFY CSW TO CONTINUE WITH SNF AS EARLIER REQUESTED.

## 2013-01-27 ENCOUNTER — Other Ambulatory Visit: Payer: Self-pay | Admitting: *Deleted

## 2013-01-27 MED ORDER — TRAMADOL HCL 50 MG PO TABS: ORAL_TABLET | ORAL | Status: DC

## 2013-01-27 MED ORDER — OXYCODONE HCL 5 MG PO TABS: ORAL_TABLET | ORAL | Status: DC

## 2013-01-28 ENCOUNTER — Non-Acute Institutional Stay (SKILLED_NURSING_FACILITY): Payer: Medicare Other | Admitting: Internal Medicine

## 2013-01-28 ENCOUNTER — Encounter: Payer: Self-pay | Admitting: Internal Medicine

## 2013-01-28 DIAGNOSIS — F329 Major depressive disorder, single episode, unspecified: Secondary | ICD-10-CM

## 2013-01-28 DIAGNOSIS — Z954 Presence of other heart-valve replacement: Secondary | ICD-10-CM

## 2013-01-28 DIAGNOSIS — M419 Scoliosis, unspecified: Secondary | ICD-10-CM

## 2013-01-28 DIAGNOSIS — R1310 Dysphagia, unspecified: Secondary | ICD-10-CM

## 2013-01-28 DIAGNOSIS — J449 Chronic obstructive pulmonary disease, unspecified: Secondary | ICD-10-CM

## 2013-01-28 DIAGNOSIS — M1611 Unilateral primary osteoarthritis, right hip: Secondary | ICD-10-CM

## 2013-01-28 DIAGNOSIS — M169 Osteoarthritis of hip, unspecified: Secondary | ICD-10-CM

## 2013-01-28 DIAGNOSIS — J4489 Other specified chronic obstructive pulmonary disease: Secondary | ICD-10-CM

## 2013-01-28 DIAGNOSIS — I350 Nonrheumatic aortic (valve) stenosis: Secondary | ICD-10-CM

## 2013-01-28 DIAGNOSIS — R5381 Other malaise: Secondary | ICD-10-CM

## 2013-01-28 DIAGNOSIS — F1010 Alcohol abuse, uncomplicated: Secondary | ICD-10-CM

## 2013-01-28 DIAGNOSIS — R531 Weakness: Secondary | ICD-10-CM

## 2013-01-28 DIAGNOSIS — Z952 Presence of prosthetic heart valve: Secondary | ICD-10-CM

## 2013-01-28 DIAGNOSIS — M1610 Unilateral primary osteoarthritis, unspecified hip: Secondary | ICD-10-CM

## 2013-01-28 DIAGNOSIS — I359 Nonrheumatic aortic valve disorder, unspecified: Secondary | ICD-10-CM

## 2013-01-28 DIAGNOSIS — F3289 Other specified depressive episodes: Secondary | ICD-10-CM

## 2013-01-28 DIAGNOSIS — M4120 Other idiopathic scoliosis, site unspecified: Secondary | ICD-10-CM

## 2013-01-28 DIAGNOSIS — R5383 Other fatigue: Secondary | ICD-10-CM

## 2013-01-28 NOTE — Progress Notes (Signed)
Patient ID: Samuel [name], male   DOB: 1926-06-01, 79 y.o.   MRN: [id number] Provider:  Gwenith Spitz. Renato Gails, D.O., C.M.D. Location:  [facility]   PCP: Ralene Ok, MD  Code Status: full code   No Known Allergies  Chief Complaint  Patient presents with  . Hospitalization Follow-up    HPI: 79 y.o. 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  He was hospitalized through 10/24, then transferred here.  He was seen during therapy.  He was irritable and does not seem to think he needs to exercise b/c he is not overweight.  He was seen walking well with Loraine Leriche from PT earlier in the day outside even in the grass.  He sees Dr. Cornelius Moras tomorrow in follow up.  He c/o pain in his hips.  He does not want to be here and has been asking his sister to bring him unhealthy foods liked canned sausages to eat.  I suspect he wants to return home so he can resume his drinking habit (which was said to have slowed down before his surgery).  He remains depressed.    ROS: Review of Systems  Constitutional: Positive for malaise/fatigue. Negative for fever and chills.  HENT: Negative for congestion.   Respiratory: Negative for shortness of breath.   Cardiovascular: Positive for chest pain. Negative for palpitations, orthopnea and leg swelling.       Mild   Gastrointestinal: Positive for diarrhea. Negative for abdominal pain, constipation, blood in stool and melena.       Loss of appetite, is on mechanical soft diet due to dysphagia identified in the hospital--dislikes this food  Genitourinary: Negative for dysuria.  Musculoskeletal: Positive for back pain, falls,  joint pain and myalgias.  Skin: Negative for rash.  Neurological: Positive for weakness. Negative for headaches.       Previously was having syncope  Endo/Heme/Allergies: Bruises/bleeds easily.  Psychiatric/Behavioral: Positive for depression and substance abuse.       Abuses alcohol     Past Medical History  Diagnosis Date  . Depression   . COPD (chronic obstructive pulmonary disease)   . Aortic stenosis     Echo 7/14:  Severe LVH, EF 55-60%, severe AS, mean gradient 78 mmHg, MAC, mild LAE  . Essential hypertension, benign   . Decreased libido   . DIZZINESS, CHRONIC   . Alcohol abuse   . Shortness of breath 10/24/11    "all the time"  . Arthritis     "in the low vertebra"  . Chronic lower back pain   . Scoliosis/kyphoscoliosis 10/28/2011  . Complication of anesthesia     NO NECK LINES ON RIGHT FOR SURGERY  . Heart murmur   . GERD (gastroesophageal reflux disease)     occ  . Abdominal aortic aneurysm 10/29/2011    3.7cm AAA by CT scan with unusual appearance suggestive of penetrating atherosclerotic ulcer or localized dissection  . CHF (congestive heart failure)   . S/P TAVR (transcatheter aortic valve replacement) 01/21/2013    29 mm Edwards Sapien XT transcatheter heart valve placed via direct aortic approach through a right anterior mini-thoracotomy   Past Surgical History  Procedure Laterality Date  . No past surgeries    .  Transcatheter aortic valve replacement, transaortic N/A 01/21/2013    Procedure: TRANSCATHETER AORTIC VALVE REPLACEMENT, TRANSAORTIC;  Surgeon: Purcell Nails, MD;  Location: [facility] OR;  Service: Open Heart Surgery;  Laterality: N/A;  . Intraoperative transesophageal echocardiogram N/A 01/21/2013    Procedure: INTRAOPERATIVE TRANSESOPHAGEAL ECHOCARDIOGRAM;  Surgeon: Purcell Nails, MD;  Location: [facility] OR;  Service: Open Heart Surgery;  Laterality: N/A;  . Thoracotomy Right 01/21/2013    Procedure: MINI/LIMITED THORACOTOMY;  Surgeon: Purcell Nails, MD;   Location: [facility] OR;  Service: Open Heart Surgery;  Laterality: Right;   Social History:   reports that he quit smoking about 60 years ago. His smoking use included Cigarettes. He smoked 0.00 packs per day for 2.5 years. He has never used smokeless tobacco. He reports that he drinks about 37.8 ounces of alcohol per week. He reports that he does not use illicit drugs.  Family History  Problem Relation Age of Onset  . Other      no known medical hx of heart disease    Medications: Patient's Medications  New Prescriptions   No medications on file  Previous Medications   ALPRAZOLAM (XANAX) 0.5 MG TABLET    Take 0.5 mg by mouth 2 (two) times daily as needed for anxiety.    AMLODIPINE (NORVASC) 10 MG TABLET    Take 10 mg by mouth daily.    ASPIRIN EC 81 MG EC TABLET    Take 1 tablet (81 mg total) by mouth daily.   BIOFLAVONOID PRODUCTS (ESTER C PO)    Take 1,000 mg by mouth daily.   CLOPIDOGREL (PLAVIX) 75 MG TABLET    Take 1 tablet (75 mg total) by mouth daily with breakfast.   CYANOCOBALAMIN (VITAMIN B-12 SL)    Place 1 tablet under the tongue daily.   FUROSEMIDE (LASIX) 20 MG TABLET    Take 10 mg by mouth daily.   METOPROLOL TARTRATE (LOPRESSOR) 25 MG TABLET    Take 0.5 tablets (12.5 mg total) by mouth 2 (two) times daily.   MULTIPLE VITAMIN (MULTIVITAMIN WITH MINERALS) TABS    Take 1 tablet by mouth daily.   MULTIPLE VITAMINS-MINERALS (ZINC PO)    Take 1 tablet by mouth daily.   OMEGA-3 FATTY ACIDS (FISH OIL PO)    Take 1 capsule by mouth daily.   OVER THE COUNTER MEDICATION    Take 30-60 mLs by mouth daily as needed (sinus congestion). Colloidal silver   OXYCODONE (OXY IR/ROXICODONE) 5 MG IMMEDIATE RELEASE TABLET    Take one tablet by mouth every 3 hours as needed for pain on a scale of 1-5; Take two tablets by mouth every 3 hours as needed for pain on a scale of 6-10   POTASSIUM CHLORIDE 20 MEQ TBCR    Take 20 mEq by mouth daily.   SELENIUM PO    Take 1 tablet by mouth daily.   TRAMADOL  (ULTRAM) 50 MG TABLET    Take 50 mg by mouth 2 (two) times daily as needed for pain.    TRAMADOL (ULTRAM) 50 MG TABLET    Take one tablet by mouth twice daily as needed for pain   VITAMIN B-12 (CYANOCOBALAMIN) 100 MCG TABLET    Take 50 mcg by mouth daily.    Modified Medications   No medications on file  Discontinued Medications   No medications on file     Physical Exam: Filed Vitals:   01/28/13 1135  BP: 134/74  Pulse: 76  Temp: 97.7 F (36.5 C)  Resp: 18  Height: 5\' 9"  (1.753 m)  Weight: 154 lb (69.854 kg)  Physical Exam  Constitutional: He is oriented to person, place, and time. No distress.  Frail, cachectic white male seated on therapy table  HENT:  Head: Normocephalic and atraumatic.  Right Ear: External ear normal.  Left Ear: External ear normal.  Nose: Nose normal.  Mouth/Throat: Oropharynx is clear and moist.  Keeps eyes closed through most of visit and will not make eye contact  Eyes: Pupils are equal, round, and reactive to light.  Neck: Neck supple.  Cardiovascular: Normal rate and regular rhythm.   Murmur heard. Pulmonary/Chest: Effort normal and breath sounds normal. No respiratory distress.  Abdominal: Soft. Bowel sounds are normal. He exhibits no distension. There is no tenderness.  Musculoskeletal:  Severe scoliosis with curvature in thoracic area toward his right side  Neurological: He is alert and oriented to person, place, and time.  Skin: Skin is warm and dry. He is not diaphoretic.  Multiple small ecchymotic areas, thin, dry skin    Labs reviewed: Basic Metabolic Panel:  Recent Labs  19/14/78 0405 01/22/13 1808 01/22/13 1810 01/23/13 0500 01/24/13 0545  NA 138 138  --  136 138  K 4.4 3.9  --  4.3 4.0  CL 106 100  --  103 102  CO2 22  --   --  27 29  GLUCOSE 155* 125*  --  126* 86  BUN 14 17  --  20 21  CREATININE 0.91 1.20 1.02 1.01 1.03  CALCIUM 7.5*  --   --  7.7* 7.8*  MG 2.0  --  2.0  --   --    Liver Function Tests:  Recent  Labs  01/17/13 1300  AST 34  ALT 16  ALKPHOS 76  BILITOT 0.7  PROT 7.6  ALBUMIN 3.7  CBC:  Recent Labs  01/22/13 1810 01/23/13 0500 01/24/13 0545  WBC 7.5 6.2 4.8  HGB 11.0* 10.5* 10.5*  HCT 31.6* 31.0* 30.0*  MCV 94.9 95.1 94.9  PLT 81* 75* 77*  CBG:  Recent Labs  01/23/13 0422 01/23/13 0755 01/24/13 1115  GLUCAP 117* 94 81   Imaging and Procedures: 2D echo, cardiac cath, CTA chest/abdomen/pelvis, operative report reviewed  Assessment/Plan 1. Aortic stenosis, severe -was having "SAD" signs/symptoms with Class III diastolic chf -is now s/p TAVR with only mild paravalvular leakage remaining -here for short term rehab after surgery for strengthening  2. COPD -had PFTs 01/17/13 preoperatively -he is not on any medications for this at this time  3. Scoliosis/kyphoscoliosis -congenital -has associated tortuous aorta and chronic pain, stooped posture -using tramadol routinely with oxycodone for breakthrough severe pain  4. Dysphagia -pt is very unhappy with his mechanical soft diet -working with speech therapy here--may be able to upgrade diet depending on how he does -likely due to his scoliosis and only worsened by his alcohol abuse putting him at risk for aspiration pneumonias  5. ALCOHOL ABUSE -I suspect this is why he wants to return home and is so irritable here (in combo with his dietary preferences) -has xanax for withdrawal but is certainly through this period (no DTs noted in hospital records either)  6. DEPRESSION -would favor starting an SSRI to help manage this and taper off xanax completely as it increases fall risk and puts him a risk of overdose in combination with alcohol  7. Generalized weakness -receiving pt, ot here for strengthening--is doing quite well -f/u with CTS tomorrow as planned  8.  Physical deconditioning -improving -CHF much better than it was -keep working with therapy  9. S/P TAVR (transcatheter aortic valve  replacement) By Dr. Cornelius Moras and Dr. Excell Seltzer assisted by Dr. Laneta Simmers -f/u with them as planned -monitor incisional sites for proper healing  10. Osteoarthritis of right hip -s/p steroid injection prior to his preop appt -continue to monitor -also has tramadol with oxycodone for breakthrough pain as he works with therapy (likely doing a lot more than he was at home though he tells me he had active jobs throughout his life)   Functional status: independent in ADLs, here for strengthening  Family/ staff Communication: sister present for visit and reviewed with unit supervisor who was also present  Labs/tests ordered:  F/u with Dr. Cornelius Moras (CTS), cbc, cmp next wk

## 2013-01-29 ENCOUNTER — Telehealth: Payer: Self-pay | Admitting: *Deleted

## 2013-01-29 DIAGNOSIS — M1611 Unilateral primary osteoarthritis, right hip: Secondary | ICD-10-CM | POA: Insufficient documentation

## 2013-01-29 NOTE — Telephone Encounter (Signed)
Patient called office saying he has had all the rehab he can handle and he wants to go home.  I spoke with Dr. Cornelius Moras and he is okay with him going home if he seems to be getting around okay at [facility] and if they set up his home health needs.  I spoke with his nurse at SNF and she says he is getting around okay.  I gave her the d/c order and asked the social worker to set up home health nurse and home health PT.  She said that the MD would be in on Friday (10/31) to do the d/c paperwork.  I let Samuel [name] know this and he said she was leaving today no matter what, that he cannot wait until Friday.  I tried to talk him into staying until then but he says he needs to get home.  Dr. Cornelius Moras is aware.  I will follow up to make sure home health was all set up.  Told the patient to call me with any other questions/concerns.

## 2013-01-31 ENCOUNTER — Telehealth: Payer: Self-pay | Admitting: *Deleted

## 2013-01-31 ENCOUNTER — Telehealth: Payer: Self-pay

## 2013-01-31 NOTE — Telephone Encounter (Signed)
Called to check on patient today since he went home from SNF on Wednesday 10/29.  He states he is very weak & nauseous.  He says he only has HH PT coming out to see him twice a week.  I suggested to him that he needs to go to the ER if he was feeling so bad but then he says he isn't feeling "that bad".  He states he doesn't have a HHRN coming out to check on him but I will follow up with that.  I encouraged him to get out and walk 3 times a day, to take all of his B/P medications and plavix and to eat a heart healthy diet.  He says that [facility] nursing comes every Tuesday to help with medications and to check vital signs.  I encourgaed him to go to that clinic every Tuesday.  I will continue to follow up with Samuel [name] each week to check on him.  Told him to call the office if any other questions or concerns came up.

## 2013-01-31 NOTE — Telephone Encounter (Signed)
Gani from [facility] called to inform Dr.Reed that patient declined services. Patient states he was sick, weak and overall thought it was a waist of time for them to be there.   Dr.Reed was verbally informed.

## 2013-02-10 DIAGNOSIS — Z48812 Encounter for surgical aftercare following surgery on the circulatory system: Secondary | ICD-10-CM

## 2013-02-13 ENCOUNTER — Other Ambulatory Visit: Payer: Self-pay | Admitting: *Deleted

## 2013-02-14 ENCOUNTER — Ambulatory Visit (HOSPITAL_COMMUNITY): Payer: Medicare Other

## 2013-02-14 ENCOUNTER — Telehealth: Payer: Self-pay | Admitting: *Deleted

## 2013-02-14 NOTE — Telephone Encounter (Signed)
Called patient to check in on him and to make sure he was coming to get his echocardiogram today.  He says he needed to cancel it due to not feeling well and didn't have a ride.  He says he is going to his PCP this afternoon with his daughter to get checked out.  I have rescheduled his 30 day echo for Monday 11/17 & he will see Dr. Cornelius Moras the same day.  I tried to encourage him to come today but he refused.  I will follow up with him on Monday morning to make sure he can make these appts.  I told him if he gets to feeling worse over the weekend to call 911 but he says he is only nauseous and has lack of appetite.

## 2013-02-17 ENCOUNTER — Telehealth: Payer: Self-pay | Admitting: *Deleted

## 2013-02-17 ENCOUNTER — Ambulatory Visit (HOSPITAL_COMMUNITY): Payer: Medicare Other

## 2013-02-17 ENCOUNTER — Ambulatory Visit: Payer: Medicare Other | Admitting: Thoracic Surgery (Cardiothoracic Vascular Surgery)

## 2013-02-17 NOTE — Telephone Encounter (Signed)
Called today to check on [name].  He says he is getting too weak and cannot make his echo appt or Dr. Orvan July appt today.  I advised him to call 911.  He still refuses and says a nurse is coming out to check on him today.  I will follow up again this afternoon.

## 2013-02-18 ENCOUNTER — Ambulatory Visit (INDEPENDENT_AMBULATORY_CARE_PROVIDER_SITE_OTHER): Payer: Medicare Other | Admitting: Thoracic Surgery (Cardiothoracic Vascular Surgery)

## 2013-02-18 ENCOUNTER — Encounter: Payer: Self-pay | Admitting: Thoracic Surgery (Cardiothoracic Vascular Surgery)

## 2013-02-18 ENCOUNTER — Other Ambulatory Visit: Payer: Self-pay | Admitting: *Deleted

## 2013-02-18 ENCOUNTER — Ambulatory Visit
Admission: RE | Admit: 2013-02-18 | Discharge: 2013-02-18 | Disposition: A | Discharge status: Home / Self Care | Payer: Medicare Other | Source: Ambulatory Visit | Attending: Thoracic Surgery (Cardiothoracic Vascular Surgery) | Admitting: Thoracic Surgery (Cardiothoracic Vascular Surgery)

## 2013-02-18 VITALS — BP 117/65 | HR 98 | Resp 20 | Ht 72.0 in | Wt 151.0 lb

## 2013-02-18 DIAGNOSIS — Z952 Presence of prosthetic heart valve: Secondary | ICD-10-CM

## 2013-02-18 DIAGNOSIS — I359 Nonrheumatic aortic valve disorder, unspecified: Secondary | ICD-10-CM

## 2013-02-18 DIAGNOSIS — Z954 Presence of other heart-valve replacement: Secondary | ICD-10-CM

## 2013-02-18 DIAGNOSIS — I350 Nonrheumatic aortic (valve) stenosis: Secondary | ICD-10-CM

## 2013-02-18 LAB — COMPREHENSIVE METABOLIC PANEL
ALT: 30 U/L (ref 0–53)
AST: 94 U/L — ABNORMAL HIGH (ref 0–37)
Albumin: 3.2 g/dL — ABNORMAL LOW (ref 3.5–5.2)
Alkaline Phosphatase: 121 U/L — ABNORMAL HIGH (ref 39–117)
BUN: 17 mg/dL (ref 6–23)
Calcium: 8 mg/dL — ABNORMAL LOW (ref 8.4–10.5)
Chloride: 98 mEq/L (ref 96–112)
Potassium: 4.3 mEq/L (ref 3.5–5.3)
Sodium: 136 mEq/L (ref 135–145)
Total Protein: 6.6 g/dL (ref 6.0–8.3)

## 2013-02-18 LAB — CBC
MCH: 32.3 pg (ref 26.0–34.0)
MCHC: 34.5 g/dL (ref 30.0–36.0)
MCV: 93.7 fL (ref 78.0–100.0)
Platelets: 73 10*3/uL — ABNORMAL LOW (ref 150–400)
RBC: 3.78 MIL/uL — ABNORMAL LOW (ref 4.22–5.81)
RDW: 15.6 % — ABNORMAL HIGH (ref 11.5–15.5)

## 2013-02-18 NOTE — Progress Notes (Signed)
[address]       [name] [address]             [phone]     CARDIOTHORACIC SURGERY OFFICE NOTE  Referring Provider is Lewayne Bunting, MD PCP is Ralene Ok, MD   HPI:  Patient returns for follow up s/p transcatheter aortic valve replacement via right anterior mini-thoracotomy direct aortic approach on 01/21/2013.  His early postoperative recovery was remarkably uncomplicated and he was discharged to a local skilled nursing facility on the 3rd postoperative day.  Although he was doing acceptably well there he was discharged only 4 days later on his own volition and he has been home alone ever since.  Initial arrangements for home health assistance were only partially made, again at the patient's assistance.  Reportedly home health physical therapy is now coming twice a week but no other follow up is in place.  Last week the patient missed his previously scheduled follow up appointments in our office.  He was contacted several times by telephone, and he admitted to drinking alcohol.  Associated with this he developed nausea and reportedly hadn't been eating much.  His daughter was contacted and reportedly went to his house and removed all of the alcohol.  He states that we was seen by his primary care physician yesterday, although we don't have any records of that visit.  He states that over the last few days he has been feeling better since he quit drinking again, and he claims that he is through with alcohol (again).  He denies any pain in his chest or hip and reports only minor low back pain from arthritis.  He denies and shortness of breath or cough.  He has not had any tachypalpitations.  He admits that he is quite weak, and he can only walk short distances without stopping.   Current Outpatient Prescriptions  Medication Sig Dispense Refill  . ALPRAZolam (XANAX) 0.5 MG tablet Take 0.5 mg by mouth 2 (two) times daily as needed for anxiety.       Marland Kitchen amLODipine  (NORVASC) 10 MG tablet Take 10 mg by mouth daily.       Marland Kitchen aspirin EC 81 MG EC tablet Take 1 tablet (81 mg total) by mouth daily.      . clopidogrel (PLAVIX) 75 MG tablet Take 1 tablet (75 mg total) by mouth daily with breakfast.      . furosemide (LASIX) 20 MG tablet Take 10 mg by mouth daily.      . metoprolol tartrate (LOPRESSOR) 25 MG tablet Take 0.5 tablets (12.5 mg total) by mouth 2 (two) times daily.      . Multiple Vitamin (MULTIVITAMIN WITH MINERALS) TABS Take 1 tablet by mouth daily.  30 tablet  0  . Omega-3 Fatty Acids (FISH OIL PO) Take 1 capsule by mouth daily.      . potassium chloride 20 MEQ TBCR Take 20 mEq by mouth daily.      . traMADol (ULTRAM) 50 MG tablet Take one tablet by mouth twice daily as needed for pain  60 tablet  5  . Bioflavonoid Products (ESTER C PO) Take 1,000 mg by mouth daily.      . Cyanocobalamin (VITAMIN B-12 SL) Place 1 tablet under the tongue daily.      . Multiple Vitamins-Minerals (ZINC PO) Take 1 tablet by mouth daily.      Marland Kitchen OVER THE COUNTER MEDICATION Take 30-60 mLs by mouth daily as needed (  sinus congestion). Colloidal silver      . SELENIUM PO Take 1 tablet by mouth daily.      . vitamin B-12 (CYANOCOBALAMIN) 100 MCG tablet Take 50 mcg by mouth daily.         No current facility-administered medications for this visit.   Facility-Administered Medications Ordered in Other Visits  Medication Dose Route Frequency Provider Last Rate Last Dose  . 0.9 %  sodium chloride infusion  1 mL/kg/hr Intravenous Continuous Lewayne Bunting, MD          Physical Exam:   BP 117/65  Pulse 98  Resp 20  Ht 6' (1.829 m)  Wt 151 lb (68.493 kg)  BMI 20.47 kg/m2  SpO2 98%  General:  Thin and frail but no acute distress and breathing comfortably  Chest:   Remarkably clear  CV:   RRR w/ blowing systolic murmur RSB, no diastolic murmur  Incisions:  Healing nicely  Abdomen:  Soft and non-tender  Extremities:  Warm and well perfused, no edema  Diagnostic  Tests:  CHEST 2 VIEW  COMPARISON: 01/24/2013 and earlier.  FINDINGS:  Chronic severe thoracolumbar scoliosis. Stable visualized osseous  structures. Interval resolved small pleural effusions. Stable  cardiac size and mediastinal contours. Sequelae of cardiac valve  replacement. No pneumothorax or edema.  IMPRESSION:  Interval resolved pleural effusions. No acute cardiopulmonary  abnormality.  Electronically Signed  By: Augusto Gamble M.D.  On: 02/18/2013 15:37    Impression:  The patient is doing remarkably well from a cardiovascular standpoint despite his extremely debilitated condition and stubborn refusal of support.  He is severely malnourished and weak, although this is not a new problem.  His return to drinking at this point is particularly disappointing given that he had appeared to stop for a fairly long time prior to surgery.  His long term prognosis is guarded at best, but with family support and some effort on his part I think he could do well for a long time.    Plan:  I had a very blunt discussion with Samuel [name] and his daughter outlining what I expect will happen if he doesn't take this opportunity to stop drinking and participate in a positive way with his physical and nutritional rehabilitation.  He seems agreeable at least this afternoon.  We will check some blood work today and get routine follow up echocardiogram this week.  We will try to arrange for home health social work and occupational therapy to start working with him to address safety concerns at home and assist with setting up nutritional support, such as meals-on-wheels.  We will touch base with existing home health PT arrangements and ask for nursing to check on him at least once per week.  We have not made any changes in his current medications.  Typically we would recommend a total of 6 months dual anti-platelet therapy with aspirin and Plavix, but the Plavix could be discontinued early without much if any risk if  he has problems getting the prescription filled.  It's more important that he take his Norvasc and Metoprolol.  I have asked him to go see Dr Jens Som within the next 3 or 4 weeks for follow up.  We will see him in the multidisciplinary heart valve clinic in 1 year.  I will plan to see him in 3 months.   Samuel Decent. Cornelius Moras, MD 02/18/2013 4:17 PM

## 2013-02-18 NOTE — Patient Instructions (Addendum)
Stop drinking alcohol-containing beverages  Begin working with home health nursing, social workers and therapists to increase physical activity and improve dietary intake.  The patient may continue to gradually increase their physical activity as tolerated.  They should refrain from any heavy lifting or strenuous use of their arms and shoulders until at least 8 weeks from the time of their surgery, and they should avoid activities that cause increased pain in their chest on the side of their surgical incision.  Otherwise they may continue to increase their activities without any particular limitations.

## 2013-02-19 ENCOUNTER — Telehealth: Payer: Self-pay | Admitting: *Deleted

## 2013-02-19 NOTE — Telephone Encounter (Signed)
Patient advised to stop taking plavix per Dr. Cornelius Moras.  He understands and I told him I will call him if something else changes.

## 2013-02-21 ENCOUNTER — Ambulatory Visit (HOSPITAL_COMMUNITY)
Admission: RE | Admit: 2013-02-21 | Discharge: 2013-02-21 | Disposition: A | Discharge status: Home / Self Care | Payer: Medicare Other | Source: Ambulatory Visit | Attending: Cardiovascular Disease | Admitting: Cardiovascular Disease

## 2013-02-21 DIAGNOSIS — R0609 Other forms of dyspnea: Secondary | ICD-10-CM | POA: Insufficient documentation

## 2013-02-21 DIAGNOSIS — J4489 Other specified chronic obstructive pulmonary disease: Secondary | ICD-10-CM | POA: Insufficient documentation

## 2013-02-21 DIAGNOSIS — I10 Essential (primary) hypertension: Secondary | ICD-10-CM | POA: Insufficient documentation

## 2013-02-21 DIAGNOSIS — Z09 Encounter for follow-up examination after completed treatment for conditions other than malignant neoplasm: Secondary | ICD-10-CM | POA: Insufficient documentation

## 2013-02-21 DIAGNOSIS — I359 Nonrheumatic aortic valve disorder, unspecified: Secondary | ICD-10-CM

## 2013-02-21 DIAGNOSIS — Z952 Presence of prosthetic heart valve: Secondary | ICD-10-CM | POA: Insufficient documentation

## 2013-02-21 DIAGNOSIS — J449 Chronic obstructive pulmonary disease, unspecified: Secondary | ICD-10-CM | POA: Insufficient documentation

## 2013-02-21 DIAGNOSIS — I379 Nonrheumatic pulmonary valve disorder, unspecified: Secondary | ICD-10-CM

## 2013-02-21 DIAGNOSIS — R0989 Other specified symptoms and signs involving the circulatory and respiratory systems: Secondary | ICD-10-CM | POA: Insufficient documentation

## 2013-02-21 NOTE — Progress Notes (Signed)
Echo Lab  2D Echocardiogram completed.  [name], RDCS 02/21/2013 2:42 PM

## 2013-02-25 ENCOUNTER — Other Ambulatory Visit: Payer: Self-pay | Admitting: Internal Medicine

## 2013-03-06 ENCOUNTER — Other Ambulatory Visit: Payer: Self-pay | Admitting: Internal Medicine

## 2013-03-06 ENCOUNTER — Ambulatory Visit (INDEPENDENT_AMBULATORY_CARE_PROVIDER_SITE_OTHER): Payer: Medicare Other | Admitting: Physician Assistant

## 2013-03-06 ENCOUNTER — Encounter: Payer: Self-pay | Admitting: Physician Assistant

## 2013-03-06 VITALS — BP 138/71 | HR 75 | Ht 72.0 in | Wt 151.0 lb

## 2013-03-06 DIAGNOSIS — D696 Thrombocytopenia, unspecified: Secondary | ICD-10-CM

## 2013-03-06 DIAGNOSIS — Z954 Presence of other heart-valve replacement: Secondary | ICD-10-CM

## 2013-03-06 DIAGNOSIS — F1010 Alcohol abuse, uncomplicated: Secondary | ICD-10-CM

## 2013-03-06 DIAGNOSIS — Z952 Presence of prosthetic heart valve: Secondary | ICD-10-CM

## 2013-03-06 DIAGNOSIS — I10 Essential (primary) hypertension: Secondary | ICD-10-CM

## 2013-03-06 DIAGNOSIS — I359 Nonrheumatic aortic valve disorder, unspecified: Secondary | ICD-10-CM

## 2013-03-06 LAB — CBC WITH DIFFERENTIAL/PLATELET
Basophils Absolute: 0 10*3/uL (ref 0.0–0.1)
Eosinophils Absolute: 0.3 10*3/uL (ref 0.0–0.7)
Eosinophils Relative: 6.2 % — ABNORMAL HIGH (ref 0.0–5.0)
HCT: 38.4 % — ABNORMAL LOW (ref 39.0–52.0)
Lymphs Abs: 1.8 10*3/uL (ref 0.7–4.0)
MCHC: 33.3 g/dL (ref 30.0–36.0)
MCV: 96.3 fl (ref 78.0–100.0)
Monocytes Absolute: 0.5 10*3/uL (ref 0.1–1.0)
Neutro Abs: 2 10*3/uL (ref 1.4–7.7)
Neutrophils Relative %: 43.5 % (ref 43.0–77.0)
Platelets: 163 10*3/uL (ref 150.0–400.0)
RDW: 15 % — ABNORMAL HIGH (ref 11.5–14.6)
WBC: 4.6 10*3/uL (ref 4.5–10.5)

## 2013-03-06 LAB — BASIC METABOLIC PANEL
BUN: 14 mg/dL (ref 6–23)
CO2: 34 mEq/L — ABNORMAL HIGH (ref 19–32)
Chloride: 100 mEq/L (ref 96–112)
Glucose, Bld: 92 mg/dL (ref 70–99)
Potassium: 4.2 mEq/L (ref 3.5–5.1)

## 2013-03-06 MED ORDER — METOPROLOL TARTRATE 25 MG PO TABS: 12.5000 mg | ORAL_TABLET | Freq: Two times a day (BID) | ORAL | Status: DC

## 2013-03-06 MED ORDER — AMLODIPINE BESYLATE 10 MG PO TABS: 5.0000 mg | ORAL_TABLET | Freq: Every day | ORAL | Status: DC

## 2013-03-06 NOTE — Progress Notes (Signed)
[address] [place], [place]  [address] Phone: [phone] Fax:  [phone]  Date:  03/06/2013   ID:  Samuel [name], DOB Nov 13, 1926, MRN [id number]  PCP:  Ralene Ok, MD  Cardiologist:  Dr. Olga Millers     History of Present Illness: Samuel [name] is a 79 y.o. male with a hx of severe AS, HTN, ETOH abuse.  He had previously refused AVR. He was admitted 10/2011 with volume overload and symptomatic AS. LHC demonstrated luminal irregs but no significant CAD. Echo 10/2011: Mild LVH, EF 60-65%, Gr 1 DD, mod AS, mean 33 (this was felt to be worse than measured), MAC, PASP 32. He was seen in 10/2012 with worsening symptoms and follow up echo demonstrated normal LVF and severe AS with mean gradient of 78 mmHg.  He was referred to Dr. Cornelius Moras for aortic valve surgery. Patient has severe tortuosity of his aorta small infrarenal abdominal aortic aneurysm. He was not felt to be a candidate for TAVR via transfemoral approach. It was felt that he would need a direct aortic approach. He was admitted 10/21-10/24 and underwent transaortic valve replacement via transaortic approach via right anterior minithoracotomy. Post op, he remained in sinus rhythm. Postoperative course was fairly uneventful.    Follow up echo (02/21/13): Mild LVH, EF 55-60%, aortic valve with mild to moderate perivalvular leak, AI, mean aortic valve gradient 6 mm of mercury, MAC.  He was last seen by Dr. Cornelius Moras last month.  He admitted to drinking significant amounts of ETOH and he was counseled on cessation.  He tells me today he has not had anything to drink in 2 weeks.  He continues to feel weak.  He walks with a walker.  He is no longer seeing HHPT.  He notes DOE.  He states his dyspnea is worse now since before the surgery.  Denies chest pain.  No syncope.  He does feel dizzy at times.  No orthopnea, PND.  He has chronic ankle edema.    Recent Labs: 02/18/2013: ALT 30; Creatinine 0.73; Hemoglobin 12.2*; Potassium  4.3   Wt Readings from Last 3 Encounters:  03/06/13 151 lb (68.493 kg)  02/18/13 151 lb (68.493 kg)  01/28/13 154 lb (69.854 kg)     Past Medical History  Diagnosis Date  . Depression   . COPD (chronic obstructive pulmonary disease)   . Aortic stenosis     Echo 7/14:  Severe LVH, EF 55-60%, severe AS, mean gradient 78 mmHg, MAC, mild LAE  . Essential hypertension, benign   . Decreased libido   . DIZZINESS, CHRONIC   . Alcohol abuse   . Shortness of breath 10/24/11    "all the time"  . Arthritis     "in the low vertebra"  . Chronic lower back pain   . Scoliosis/kyphoscoliosis 10/28/2011  . Complication of anesthesia     NO NECK LINES ON RIGHT FOR SURGERY  . Heart murmur   . GERD (gastroesophageal reflux disease)     occ  . Abdominal aortic aneurysm 10/29/2011    3.7cm AAA by CT scan with unusual appearance suggestive of penetrating atherosclerotic ulcer or localized dissection  . CHF (congestive heart failure)   . 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 Perivalvular leak of prosthetic heart valve     echo (02/21/13): Mild LVH, EF 55-60%, aortic valve with  mild to moderate perivalvular leak, AI, mean aortic valve gradient 6 mm of mercury, MAC.    Current Outpatient Prescriptions  Medication Sig Dispense Refill  . ALPRAZolam (XANAX) 0.5 MG tablet Take 0.5 mg by mouth 2 (two) times daily as needed for anxiety.       Marland Kitchen amLODipine (NORVASC) 10 MG tablet Take 10 mg by mouth daily.       Marland Kitchen aspirin 325 MG tablet Take 325 mg by mouth daily.      Marland Kitchen Bioflavonoid Products (ESTER C PO) Take 1,000 mg by mouth daily.      . Cyanocobalamin (VITAMIN B-12 SL) Place 1 tablet under the tongue daily.      . furosemide (LASIX) 20 MG tablet Take 10 mg by mouth daily.      . Multiple Vitamin (MULTIVITAMIN WITH MINERALS) TABS Take 1 tablet by mouth  daily.  30 tablet  0  . Multiple Vitamins-Minerals (ZINC PO) Take 1 tablet by mouth daily.      . Omega-3 Fatty Acids (FISH OIL PO) Take 1 capsule by mouth daily.      . potassium chloride 20 MEQ TBCR Take 20 mEq by mouth daily.      . SELENIUM PO Take 1 tablet by mouth daily.      . traMADol (ULTRAM) 50 MG tablet TAKE 1 TABLET BY MOUTH TWICE DAILY AS NEEDED FOR PAIN  60 tablet  0  . vitamin B-12 (CYANOCOBALAMIN) 100 MCG tablet Take 50 mcg by mouth daily.         No current facility-administered medications for this visit.   Facility-Administered Medications Ordered in Other Visits  Medication Dose Route Frequency Provider Last Rate Last Dose  . 0.9 %  sodium chloride infusion  1 mL/kg/hr Intravenous Continuous Lewayne Bunting, MD        Allergies:   Review of patient's allergies indicates no known allergies.   Social History:  The patient  reports that he quit smoking about 60 years ago. His smoking use included Cigarettes. He smoked 0.00 packs per day for 2.5 years. He has never used smokeless tobacco. He reports that he drinks about 37.8 ounces of alcohol per week. He reports that he does not use illicit drugs.   Family History:  The patient's family history includes Other in an other family member.   ROS:  Please see the history of present illness.      All other systems reviewed and negative.   PHYSICAL EXAM: VS:  BP 119/64  Pulse 73  Ht 6' (1.829 m)  Wt 151 lb (68.493 kg)  BMI 20.47 kg/m2  Filed Vitals:   03/06/13 1528 03/06/13 1529 03/06/13 1530 03/06/13 1531  BP: 121/63 103/55 127/68 138/71  Pulse: 69 85 75 75  Height:      Weight:         Well nourished, well developed, in no acute distress HEENT: normal Neck: no JVD Cardiac:  normal S1, S2; RRR; 2/6 diastolic murmur at RUSB Lungs:  Decreased breath sounds bilaterally, no wheezing, rhonchi or rales Abd: soft, nontender, no hepatomegaly Ext: trace to 1+ bilateral ankle edema Skin: warm and dry Neuro:  CNs 2-12  intact, no focal abnormalities noted  EKG:  NSR, HR 73, no ST changes  ASSESSMENT AND PLAN:  1. Aortic Stenosis, s/p TAVR:  He is overall stable.  He continues to note weakness.  He has completed HHPT.  He also notes DOE.  His volume appears stable on exam.  Last CXR  in 02/2013 demonstrated resolved effusions.  I suspect he is more deconditioned than anything.  BP is a little soft and may be contributing.  I reviewed his case today with Dr. Olga Millers.  I will adjust his BP medications to see if this helps.  He will be referred to cardiac rehab.  I am hopeful he can go and continue to increase his activity.  It is possible he may not be able to tolerate cardiac rehab, but it is worth trying. 2. Perivalvular Leak:  This was noted on most recent echo.  I reviewed this with Dr. Jens Som.  He continues to f/u with Dr. Cornelius Moras and I will have him f/u closely with Dr. Jens Som. 3. Hypertension:  BP does drop some with standing.  It would be important for him to remain on a beta blocker. He is currently not taking metoprolol for unclear reasons. Decrease Norvasc to 5 mg QD.  I will add back Metoprolol 12.5 mg bid.  Check BMET today.   4. Thrombocytopenia:  Repeat CBC today. 5. ETOH Abuse:  He tells me he stopped drinking.  He did tell me today that he was feeling "horrible" and was "sick" up until the last few days.  I suspect he was nauseated from drinking ETOH.  He states he feels better now.  I have impressed upon him the importance of continuing to refrain from drinking ETOH.  He agrees. 6. Disposition:  F/u with Dr. Olga Millers in 1 month.   Signed, Tereso Newcomer, PA-C  03/06/2013 3:17 PM

## 2013-03-06 NOTE — Patient Instructions (Signed)
Your physician recommends that you schedule a follow-up appointment in: 1 MONTH WITH DR CRENSHAW.  Your physician recommends that you HAVE LABS TODAY ( BMET & CBC )  You have been referred to [facility] CARDIAC [facility]  START METOPROLOL TARTRATE 25 MG TABLET ( 1/2 TABLET OF THE 25MG  TWICE A DAY )  DECREASE NORVASC 5 MG A DAY

## 2013-03-10 ENCOUNTER — Telehealth: Payer: Self-pay | Admitting: Physician Assistant

## 2013-03-10 ENCOUNTER — Encounter: Payer: Self-pay | Admitting: *Deleted

## 2013-03-10 NOTE — Telephone Encounter (Signed)
New message     Want test results---saw Scott last week

## 2013-03-10 NOTE — Telephone Encounter (Signed)
I have lmom x 4 on [phone] for ptcb for lab results. Pt called in today and I lmom again. I also lmom on pt's daughter's phone as well. I did mail out lab results letter today to the pt.

## 2013-03-24 ENCOUNTER — Emergency Department (INDEPENDENT_AMBULATORY_CARE_PROVIDER_SITE_OTHER)
Admission: EM | Admit: 2013-03-24 | Discharge: 2013-03-24 | Disposition: A | Discharge status: Home / Self Care | Payer: Medicare Other | Source: Home / Self Care | Attending: Emergency Medicine | Admitting: Emergency Medicine

## 2013-03-24 ENCOUNTER — Emergency Department (INDEPENDENT_AMBULATORY_CARE_PROVIDER_SITE_OTHER): Payer: Medicare Other

## 2013-03-24 ENCOUNTER — Encounter (HOSPITAL_COMMUNITY): Payer: Self-pay | Admitting: Emergency Medicine

## 2013-03-24 DIAGNOSIS — S9030XA Contusion of unspecified foot, initial encounter: Secondary | ICD-10-CM

## 2013-03-24 DIAGNOSIS — S9032XA Contusion of left foot, initial encounter: Secondary | ICD-10-CM

## 2013-03-24 NOTE — ED Notes (Signed)
Pt  Has  Pain   And  Swelling    Of  The      Foot   With  Bruising       Noted  He  States  He   May  Have    Dropped   Something on it  But  Does  Not  Remember a  specefic  Incident       The  Pt  Ambulates   Slowly   With  The  Use  Of a  Walker

## 2013-03-24 NOTE — ED Provider Notes (Signed)
Chief Complaint:   Chief Complaint  Patient presents with  . Foot Pain    History of Present Illness:   Samuel [name] is an 79 year old male who has had swelling and slight pain over the dorsum of the left foot for the past 4 days. He cannot recall any definite injury, although he admits that his memory is poor and he'll often forget things. He has been ambulatory with minimal pain. There is some discoloration of the foot. He denies any pain in the calf, ankle, or knee. He has not had any fever or chills.  Review of Systems:  Other than noted above, the patient denies any of the following symptoms: Systemic:  No fevers, chills, or sweats.  No fatigue or tiredness. Musculoskeletal:  No joint pain, arthritis, bursitis, swelling, or back pain.  Neurological:  No muscular weakness, paresthesias.  PMFSH:  Past medical history, family history, social history, meds, and allergies were reviewed.  No history of gout.  He takes Xanax, Norvasc, aspirin, vitamin B12, furosemide, Lopressor, many vitamins and supplements, potassium chloride, and tramadol. He has a history of valve replacement.  Physical Exam:   Vital signs:  BP 171/68  Pulse 74  Temp(Src) 98.3 F (36.8 C) (Oral)  Resp 20  SpO2 100% Gen:  Alert and oriented times 3.  In no distress. Musculoskeletal:  Exam of the foot reveals pitting edema over the dorsum of the left foot and some bruising present extending to the toes. There was no pain to palpation. Pulses were full. He has good capillary refill. The toes are warm and pink.  Otherwise, all joints had a full a ROM with no swelling, bruising or deformity.  No edema, pulses full. Extremities were warm and pink.  Capillary refill was brisk. No calf tenderness and Homans sign was negative. No tenderness to palpation over the ankle or the heel. Skin:  Clear, warm and dry.  No rash. Neuro:  Alert and oriented times 3.  Muscle strength was normal.  Sensation was intact to light touch.    Radiology:  Dg Foot Complete Left  03/24/2013   CLINICAL DATA:  Swelling and bruising without definitive injury  EXAM: LEFT FOOT - COMPLETE 3+ VIEW  COMPARISON:  None.  FINDINGS: No acute fracture or dislocation is noted. Soft tissue swelling is noted in the distal foot.  IMPRESSION: Soft tissue changes without acute bony abnormality.   Electronically Signed   By: Alcide Clever M.D.   On: 03/24/2013 18:04   I reviewed the images independently and personally and concur with the radiologist's findings.  Course in Urgent Care Center:   The toe was wrapped in an Ace wrap and he was placed in a postoperative shoe.  Assessment:  The encounter diagnosis was Traumatic hematoma of left foot.  I think he must have dropped something on the foot. There is no evidence of fracture. This appears to be just a hematoma. Circulation is good.  Plan:   1.  Meds:  The following meds were prescribed:   Discharge Medication List as of 03/24/2013  6:26 PM      2.  Patient Education/Counseling:  The patient was given appropriate handouts, self care instructions, and instructed in symptomatic relief including rest and activity, elevation, application of ice and compression.   3.  Follow up:  The patient was told to follow up if no better in 3 to 4 days, if becoming worse in any way, and given some red flag symptoms such as worsening pain  or difficulty ambulating which would prompt immediate return.  Follow up here if necessary.       Reuben Likes, MD 03/24/13 2227

## 2013-04-09 ENCOUNTER — Other Ambulatory Visit: Payer: Self-pay | Admitting: *Deleted

## 2013-04-14 ENCOUNTER — Ambulatory Visit (INDEPENDENT_AMBULATORY_CARE_PROVIDER_SITE_OTHER): Payer: Medicare Other | Admitting: Cardiology

## 2013-04-14 ENCOUNTER — Encounter: Payer: Self-pay | Admitting: Cardiology

## 2013-04-14 ENCOUNTER — Ambulatory Visit (INDEPENDENT_AMBULATORY_CARE_PROVIDER_SITE_OTHER): Payer: Medicare Other | Admitting: Thoracic Surgery (Cardiothoracic Vascular Surgery)

## 2013-04-14 ENCOUNTER — Encounter: Payer: Self-pay | Admitting: Thoracic Surgery (Cardiothoracic Vascular Surgery)

## 2013-04-14 VITALS — BP 117/61 | HR 80 | Resp 18 | Ht 72.0 in | Wt 145.0 lb

## 2013-04-14 VITALS — BP 148/60 | HR 80 | Ht 72.0 in | Wt 145.0 lb

## 2013-04-14 DIAGNOSIS — I714 Abdominal aortic aneurysm, without rupture, unspecified: Secondary | ICD-10-CM

## 2013-04-14 DIAGNOSIS — I359 Nonrheumatic aortic valve disorder, unspecified: Secondary | ICD-10-CM

## 2013-04-14 DIAGNOSIS — I350 Nonrheumatic aortic (valve) stenosis: Secondary | ICD-10-CM

## 2013-04-14 DIAGNOSIS — Z952 Presence of prosthetic heart valve: Secondary | ICD-10-CM

## 2013-04-14 DIAGNOSIS — Z954 Presence of other heart-valve replacement: Secondary | ICD-10-CM

## 2013-04-14 DIAGNOSIS — I10 Essential (primary) hypertension: Secondary | ICD-10-CM

## 2013-04-14 MED ORDER — ASPIRIN EC 325 MG PO TBEC: 325.0000 mg | DELAYED_RELEASE_TABLET | Freq: Every day | ORAL | Status: DC

## 2013-04-14 NOTE — Progress Notes (Signed)
[address]       [name] [address]             [phone]     CARDIOTHORACIC SURGERY OFFICE NOTE  Referring Provider is Lewayne Bunting, MD PCP is Ralene Ok, MD   HPI:  Patient returns for follow up s/p transcatheter aortic valve replacement via right anterior mini-thoracotomy direct aortic approach on 01/21/2013. His early postoperative recovery was remarkably uncomplicated and he was discharged to a local skilled nursing facility on the 3rd postoperative day. Although he was doing acceptably well there he was discharged only 4 days later on his own volition and he has been home alone ever since. Initial arrangements for home health assistance were only partially made, again at the patient's assistance.  Despite all this he has done remarkably well from a cardiovascular standpoint. He was last seen here in the office 02/18/2013. Since then he has continued to remain quite stable from a cardiac standpoint. He was seen in followup earlier today by Dr. Jens Som and returns to our office because he had some further questions to discuss. He reports stable symptoms of mild exertional shortness of breath, notably much better than it was prior to surgery.  He still has generalized weakness and he reports being frustrated that he has not progressed more rapidly, but he does admit that he feels much better than he did prior to surgery.    Current Outpatient Prescriptions  Medication Sig Dispense Refill  . ALPRAZolam (XANAX) 0.5 MG tablet Take 0.5 mg by mouth 2 (two) times daily as needed for anxiety.       Marland Kitchen amLODipine (NORVASC) 10 MG tablet Take 0.5 tablets (5 mg total) by mouth daily.      Marland Kitchen aspirin EC 325 MG tablet Take 1 tablet (325 mg total) by mouth daily.  30 tablet  0  . Bioflavonoid Products (ESTER C PO) Take 1,000 mg by mouth daily.      . Cholecalciferol (VITAMIN D-3) 5000 UNITS TABS Take 1 capsule by mouth.      . Cyanocobalamin (VITAMIN B-12 SL) Place 1  tablet under the tongue daily.      . furosemide (LASIX) 20 MG tablet Take 10 mg by mouth daily.      . magnesium oxide (MAG-OX) 400 MG tablet Take 400 mg by mouth daily.      . metoprolol tartrate (LOPRESSOR) 25 MG tablet Take 0.5 tablets (12.5 mg total) by mouth 2 (two) times daily.  30 tablet  6  . Multiple Vitamin (MULTIVITAMIN WITH MINERALS) TABS Take 1 tablet by mouth daily.  30 tablet  0  . Omega-3 Fatty Acids (FISH OIL PO) Take 1 capsule by mouth daily.      . potassium chloride 20 MEQ TBCR Take 20 mEq by mouth daily.      Marland Kitchen POTASSIUM GLUCONATE ER PO Take by mouth.      . SELENIUM PO Take 1 tablet by mouth daily.      . traMADol (ULTRAM) 50 MG tablet TAKE 1 TABLET BY MOUTH TWICE DAILY AS NEEDED FOR PAIN  60 tablet  0  . vitamin B-12 (CYANOCOBALAMIN) 100 MCG tablet Take 50 mcg by mouth daily.        Marland Kitchen ZINC CITRATE-PHYTASE PO Take by mouth.       No current facility-administered medications for this visit.   Facility-Administered Medications Ordered in Other Visits  Medication Dose Route Frequency Provider Last Rate Last Dose  .  0.9 %  sodium chloride infusion  1 mL/kg/hr Intravenous Continuous Lewayne Bunting, MD          Physical Exam:   BP 117/61  Pulse 80  Resp 18  Ht 6' (1.829 m)  Wt 145 lb (65.772 kg)  BMI 19.66 kg/m2  SpO2 98%  General:  Elderly and frail but generally well-appearing  Chest:   Fairly clear  CV:   Regular rate and rhythm with soft early diastolic murmur along left sternal border  Incisions:  Completely healed  Abdomen:  Soft and nontender  Extremities:  Warm and well-perfused  Diagnostic Tests:  Transthoracic Echocardiography  Patient: Samuel [name], Samuel [name] MR #: [id number] Study Date: 02/21/2013 Gender: M Age: 80 Height: 182.9cm Weight: 68.4kg BSA: 1.17m^2 Pt. Status: Room:  PERFORMING [place], [facility] Tonny Bollman SONOGRAPHER Melissa Morford, RDCS cc:  ------------------------------------------------------------ LV EF:  55% - 60%  ------------------------------------------------------------ Indications: 424.1 Aortic valve disorders.  ------------------------------------------------------------ History: PMH: Dyspnea. Chronic obstructive pulmonary disease. Risk factors: Hypertension.  ------------------------------------------------------------ Study Conclusions  - Left ventricle: The cavity size was normal. Wall thickness was increased in a pattern of mild LVH. Systolic function was normal. The estimated ejection fraction was in the range of 55% to 60%. - Aortic valve: S/P TAVR. mild to moderate perivalvular leak. Compared to a month ago slightly worse but I think last echo was under read. Degree of AR is less than originally seen on TEE after initial placement - Mitral valve: Severe MAC extending to annular base - Atrial septum: No defect or patent foramen ovale was identified. Transthoracic echocardiography. M-mode, complete 2D, spectral Doppler, and color Doppler. Height: Height: 182.9cm. Height: 72in. Weight: Weight: 68.4kg. Weight: 150.5lb. Body mass index: BMI: 20.4kg/m^2. Body surface area: BSA: 1.59m^2. Blood pressure: 117/65. Patient status: Outpatient. Location: Echo laboratory.  ------------------------------------------------------------  ------------------------------------------------------------ Left ventricle: The cavity size was normal. Wall thickness was increased in a pattern of mild LVH. Systolic function was normal. The estimated ejection fraction was in the range of 55% to 60%.  ------------------------------------------------------------ Aortic valve: S/P TAVR. mild to moderate perivalvular leak. Compared to a month ago slightly worse but I think last echo was under read. Degree of AR is less than originally seen on TEE after initial placement Doppler: Peak velocity ratio of LVOT to aortic valve: 0.57. Mean gradient: 6mm Hg  (S).  ------------------------------------------------------------ Mitral valve: Severe MAC extending to annular base Doppler: Mean gradient: 5mm Hg (D). Peak gradient: 11mm Hg (D).  ------------------------------------------------------------ Atrial septum: No defect or patent foramen ovale was identified.  ------------------------------------------------------------ Pulmonic valve: Doppler: Mild regurgitation.  ------------------------------------------------------------ Tricuspid valve: Doppler: Mild regurgitation.  ------------------------------------------------------------ Pericardium: The pericardium was normal in appearance.  ------------------------------------------------------------  2D measurements Normal Doppler measurements Normal Left ventricle LVOT LVID ED, 28.8 mm 43-52 Peak vel, S 85 cm/s ------ chord, Aortic valve PLAX Peak vel, S 149 cm/s ------ LVID ES, 23.8 mm 23-38 Mean vel, S 112 cm/s ------ chord, VTI, S 28. cm ------ PLAX 7 FS, chord, 17 % >29 Mean 6 mm ------ PLAX gradient, S Hg LVPW, ED 12.3 mm ------ Peak vel 0.5 ------ IVS/LVPW 0.82 <1.3 ratio, 7 ratio, ED LVOT/AV Ventricular septum Regurg PHT 461 ms ------ IVS, ED 10.1 mm ------ Mitral valve Aorta Peak E vel 96 cm/s ------ Root diam, 25 mm ------ Peak A vel 157 cm/s ------ ED Mean vel, D 103 cm/s ------ Left atrium Deceleration 349 ms 150-23 AP dim 27 mm ------ time 0 AP dim 1.45 cm/m^2 <2.2 Mean 5 mm ------ index  gradient, D Hg Peak 11 mm ------ gradient, D Hg Peak E/A 0.6 ------ ratio Annulus VTI 46 cm ------  ------------------------------------------------------------ Prepared and Electronically Authenticated by  Charlton HawsNishan, Peter 2014-11-21T16:[id number]         Impression:  Patient is overall doing remarkably well nearly 3 months status post transcatheter aortic valve replacement.  He does have mild to moderate perivalvular leak that was noted at the time of surgery. He  remains clinically quite stable.  Plan:  I have suggested that the patient consider the outpatient cardiac rehabilitation program. Whether or not he will participate is certainly difficult to speculate on. We will plan to see him next October the valve clinic for routine followup for repeat echocardiogram. During the interim period of time he'll continue to followup with Dr. Jens Somrenshaw.   Salvatore Decentlarence H. Cornelius Moraswen, MD 04/14/2013 3:44 PM

## 2013-04-14 NOTE — Assessment & Plan Note (Signed)
Patient denies any in the past 6 weeks.

## 2013-04-14 NOTE — Patient Instructions (Addendum)
Your physician wants you to follow-up in: 6 MONTHS WITH DR Jens SomRENSHAW You will receive a reminder letter in the mail two months in advance. If you don't receive a letter, please call our office to schedule the follow-up appointment.   Your physician has requested that you have an echocardiogram. Echocardiography is a painless test that uses sound waves to create images of your heart. It provides your doctor with information about the size and shape of your heart and how well your heart's chambers and valves are working. This procedure takes approximately one hour. There are no restrictions for this procedure.SCHEDULE IN MAY  RESTART ASPIRIN 325 MG ONCE DAILY WITH FOOD   Your physician has requested that you have an abdominal aorta duplex. During this test, an ultrasound is used to evaluate the aorta. Allow 30 minutes for this exam. Do not eat after midnight the day before and avoid carbonated beverages

## 2013-04-14 NOTE — Assessment & Plan Note (Addendum)
Continue SBE prophylaxis. He has mild to moderate perivalvular aortic insufficiency. Plan repeat echocardiogram in May 2015. Conservative measures if possible. He is not taking his aspirin. I've asked him to resume 325 mg daily. Patient has improved since his valve was replaced but continues to have some weakness which I am hopeful will improve over time.

## 2013-04-14 NOTE — Assessment & Plan Note (Signed)
Plan repeat abdominal ultrasound. 

## 2013-04-14 NOTE — Progress Notes (Signed)
HPI: FU AVR; hx of severe AS, HTN, ETOH abuse. He had previously refused AVR. He was admitted 10/2011 with volume overload and symptomatic AS. LHC demonstrated luminal irregs but no significant CAD. Echo 10/2011: Mild LVH, EF 60-65%, Gr 1 DD, mod AS, mean 33 (this was felt to be worse than measured), MAC, PASP 32. He was seen in 10/2012 with worsening symptoms and follow up echo demonstrated normal LVF and severe AS with mean gradient of 78 mmHg.  He was referred to Dr. Cornelius Moras for aortic valve surgery. Note, patient has severe tortuosity of his aorta and small infrarenal abdominal aortic aneurysm measuring 3.7 cm by CT 7/13. He was not felt to be a candidate for TAVR via transfemoral approach. It was felt that he would need a direct aortic approach. He was admitted 10/14 and underwent transaortic valve replacement via transaortic approach via right anterior minithoracotomy. Follow up echo (02/21/13): Mild LVH, EF 55-60%, aortic valve with mild to moderate perivalvular leak, AI, mean aortic valve gradient 6 mm of mercury, MAC. Since he was last seen, he continues to have some weakness but improving. Mild dyspnea on exertion. No orthopnea, PND or chest pain. Mild pedal edema.   Current Outpatient Prescriptions  Medication Sig Dispense Refill  . ALPRAZolam (XANAX) 0.5 MG tablet Take 0.5 mg by mouth 2 (two) times daily as needed for anxiety.       Marland Kitchen amLODipine (NORVASC) 10 MG tablet Take 0.5 tablets (5 mg total) by mouth daily.      Marland Kitchen Bioflavonoid Products (ESTER C PO) Take 1,000 mg by mouth daily.      . Cyanocobalamin (VITAMIN B-12 SL) Place 1 tablet under the tongue daily.      . furosemide (LASIX) 20 MG tablet Take 10 mg by mouth daily.      Marland Kitchen MAGNESIUM PO Take by mouth.      . metoprolol tartrate (LOPRESSOR) 25 MG tablet Take 0.5 tablets (12.5 mg total) by mouth 2 (two) times daily.  30 tablet  6  . Multiple Vitamin (MULTIVITAMIN WITH MINERALS) TABS Take 1 tablet by mouth daily.  30 tablet  0  .  Multiple Vitamins-Minerals (ZINC PO) Take 1 tablet by mouth daily.      . Omega-3 Fatty Acids (FISH OIL PO) Take 1 capsule by mouth daily.      . potassium chloride 20 MEQ TBCR Take 20 mEq by mouth daily.      . promethazine (PHENERGAN) 25 MG tablet Take 25 mg by mouth every 6 (six) hours as needed for nausea or vomiting.      . SELENIUM PO Take 1 tablet by mouth daily.      . traMADol (ULTRAM) 50 MG tablet TAKE 1 TABLET BY MOUTH TWICE DAILY AS NEEDED FOR PAIN  60 tablet  0  . vitamin B-12 (CYANOCOBALAMIN) 100 MCG tablet Take 50 mcg by mouth daily.         No current facility-administered medications for this visit.   Facility-Administered Medications Ordered in Other Visits  Medication Dose Route Frequency Provider Last Rate Last Dose  . 0.9 %  sodium chloride infusion  1 mL/kg/hr Intravenous Continuous Lewayne Bunting, MD         Past Medical History  Diagnosis Date  . Depression   . COPD (chronic obstructive pulmonary disease)   . Aortic stenosis     Echo 7/14:  Severe LVH, EF 55-60%, severe AS, mean gradient 78 mmHg, MAC, mild LAE  .  Essential hypertension, benign   . Decreased libido   . DIZZINESS, CHRONIC   . Alcohol abuse   . Shortness of breath 10/24/11    "all the time"  . Arthritis     "in the low vertebra"  . Chronic lower back pain   . Scoliosis/kyphoscoliosis 10/28/2011  . Complication of anesthesia     NO NECK LINES ON RIGHT FOR SURGERY  . Heart murmur   . GERD (gastroesophageal reflux disease)     occ  . Abdominal aortic aneurysm 10/29/2011    3.7cm AAA by CT scan with unusual appearance suggestive of penetrating atherosclerotic ulcer or localized dissection  . CHF (congestive heart failure)   . 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 Perivalvular leak of prosthetic heart valve     echo (02/21/13): Mild LVH, EF  55-60%, aortic valve with mild to moderate perivalvular leak, AI, mean aortic valve gradient 6 mm of mercury, MAC.    Past Surgical History  Procedure Laterality Date  . No past surgeries    . Transcatheter aortic valve replacement, transaortic N/A 01/21/2013    Procedure: TRANSCATHETER AORTIC VALVE REPLACEMENT, TRANSAORTIC;  Surgeon: Purcell Nailslarence H Owen, MD;  Location: [facility] OR;  Service: Open Heart Surgery;  Laterality: N/A;  . Intraoperative transesophageal echocardiogram N/A 01/21/2013    Procedure: INTRAOPERATIVE TRANSESOPHAGEAL ECHOCARDIOGRAM;  Surgeon: Purcell Nailslarence H Owen, MD;  [facility];  Service: Open Heart Surgery;  Laterality: N/A;  . Thoracotomy Right 01/21/2013    Procedure: MINI/LIMITED THORACOTOMY;  Surgeon: Purcell Nailslarence H Owen, MD;  [facility];  Service: Open Heart Surgery;  Laterality: Right;    History   Social History  . Marital Status: Widowed    Spouse Name: N/A    Number of Children: N/A  . Years of Education: N/A   Occupational History  . Not on file.   Social History Main Topics  . Smoking status: Former Smoker -- 2.5 years    Types: Cigarettes    Quit date: 07/02/1952  . Smokeless tobacco: Never Used     Comment: wine x2 q nite in last week 01/17/13  . Alcohol Use: 37.8 oz/week    42 Glasses of wine, 21 Cans of beer per week     Comment: 10/24/11 "drink q day; quite a bit"; Started 1974 (mostly hard liquor)  . Drug Use: No  . Sexual Activity: No   Other Topics Concern  . Not on file   Social History Narrative   Retired worked for [employer] in [place] [employer]   Divorced: 1st and 2nd died and 3rd was separated   Never Smoked   Alcohol use -yes   Lives alone    ROS: weakness but no fevers or chills, productive cough, hemoptysis, dysphasia, odynophagia, melena, hematochezia, dysuria, hematuria, rash, seizure activity, orthopnea, PND, claudication. Remaining systems are negative.  Physical Exam: Well-developed chronically illl appearing in no  acute distress.  Skin is warm and dry.  HEENT is normal.  Neck is supple.  Chest is clear to auscultation with normal expansion.  Cardiovascular exam is regular rate and rhythm. 2/6 diastolic murmur left sternal border. Abdominal exam nontender or distended. No masses palpated. Extremities show trace to 1+ ankle edema. neuro grossly intact

## 2013-04-14 NOTE — Assessment & Plan Note (Signed)
Blood pressure controlled. Continue present medications. 

## 2013-04-14 NOTE — Patient Instructions (Signed)

## 2013-04-22 ENCOUNTER — Encounter: Payer: Self-pay | Admitting: Cardiovascular Disease

## 2013-04-22 ENCOUNTER — Ambulatory Visit (HOSPITAL_COMMUNITY): Payer: Medicare Other | Attending: Cardiology

## 2013-04-22 DIAGNOSIS — J449 Chronic obstructive pulmonary disease, unspecified: Secondary | ICD-10-CM | POA: Insufficient documentation

## 2013-04-22 DIAGNOSIS — I10 Essential (primary) hypertension: Secondary | ICD-10-CM | POA: Insufficient documentation

## 2013-04-22 DIAGNOSIS — Z87891 Personal history of nicotine dependence: Secondary | ICD-10-CM | POA: Insufficient documentation

## 2013-04-22 DIAGNOSIS — J4489 Other specified chronic obstructive pulmonary disease: Secondary | ICD-10-CM | POA: Insufficient documentation

## 2013-04-22 DIAGNOSIS — I714 Abdominal aortic aneurysm, without rupture, unspecified: Secondary | ICD-10-CM

## 2013-04-22 DIAGNOSIS — I700 Atherosclerosis of aorta: Secondary | ICD-10-CM

## 2013-05-01 ENCOUNTER — Ambulatory Visit (HOSPITAL_COMMUNITY): Payer: Medicare Other

## 2013-05-07 ENCOUNTER — Ambulatory Visit (HOSPITAL_COMMUNITY): Payer: Medicare Other

## 2013-05-08 ENCOUNTER — Ambulatory Visit (HOSPITAL_COMMUNITY): Payer: Medicare Other

## 2013-05-09 ENCOUNTER — Ambulatory Visit (HOSPITAL_COMMUNITY): Payer: Medicare Other

## 2013-05-14 ENCOUNTER — Ambulatory Visit (HOSPITAL_COMMUNITY): Payer: Medicare Other

## 2013-05-16 ENCOUNTER — Ambulatory Visit (HOSPITAL_COMMUNITY): Payer: Medicare Other

## 2013-05-21 ENCOUNTER — Ambulatory Visit (HOSPITAL_COMMUNITY): Payer: Medicare Other

## 2013-05-23 ENCOUNTER — Ambulatory Visit (HOSPITAL_COMMUNITY): Payer: Medicare Other

## 2013-05-28 ENCOUNTER — Ambulatory Visit (HOSPITAL_COMMUNITY): Payer: Medicare Other

## 2013-05-30 ENCOUNTER — Ambulatory Visit (HOSPITAL_COMMUNITY): Payer: Medicare Other

## 2013-06-04 ENCOUNTER — Ambulatory Visit (HOSPITAL_COMMUNITY): Payer: Medicare Other

## 2013-06-06 ENCOUNTER — Ambulatory Visit (HOSPITAL_COMMUNITY): Payer: Medicare Other

## 2013-06-11 ENCOUNTER — Ambulatory Visit (HOSPITAL_COMMUNITY): Payer: Medicare Other

## 2013-06-13 ENCOUNTER — Ambulatory Visit (HOSPITAL_COMMUNITY): Payer: Medicare Other

## 2013-06-18 ENCOUNTER — Ambulatory Visit (HOSPITAL_COMMUNITY): Payer: Medicare Other

## 2013-06-20 ENCOUNTER — Ambulatory Visit (HOSPITAL_COMMUNITY): Payer: Medicare Other

## 2013-06-25 ENCOUNTER — Ambulatory Visit (HOSPITAL_COMMUNITY): Payer: Medicare Other

## 2013-06-27 ENCOUNTER — Ambulatory Visit (HOSPITAL_COMMUNITY): Payer: Medicare Other

## 2013-07-02 ENCOUNTER — Ambulatory Visit (HOSPITAL_COMMUNITY): Payer: Medicare Other

## 2013-07-04 ENCOUNTER — Ambulatory Visit (HOSPITAL_COMMUNITY): Payer: Medicare Other

## 2013-07-09 ENCOUNTER — Ambulatory Visit (HOSPITAL_COMMUNITY): Payer: Medicare Other

## 2013-07-11 ENCOUNTER — Ambulatory Visit (HOSPITAL_COMMUNITY): Payer: Medicare Other

## 2013-07-16 ENCOUNTER — Ambulatory Visit (HOSPITAL_COMMUNITY): Payer: Medicare Other

## 2013-07-18 ENCOUNTER — Ambulatory Visit (HOSPITAL_COMMUNITY): Payer: Medicare Other

## 2013-07-23 ENCOUNTER — Ambulatory Visit (HOSPITAL_COMMUNITY): Payer: Medicare Other

## 2013-07-25 ENCOUNTER — Ambulatory Visit (HOSPITAL_COMMUNITY): Payer: Medicare Other

## 2013-07-30 ENCOUNTER — Ambulatory Visit (HOSPITAL_COMMUNITY): Payer: Medicare Other

## 2013-08-01 ENCOUNTER — Ambulatory Visit (HOSPITAL_COMMUNITY): Payer: Medicare Other

## 2013-08-05 ENCOUNTER — Ambulatory Visit (HOSPITAL_COMMUNITY): Payer: Medicare Other | Attending: Cardiology | Admitting: Radiology

## 2013-08-05 DIAGNOSIS — F1010 Alcohol abuse, uncomplicated: Secondary | ICD-10-CM | POA: Insufficient documentation

## 2013-08-05 DIAGNOSIS — I509 Heart failure, unspecified: Secondary | ICD-10-CM | POA: Insufficient documentation

## 2013-08-05 DIAGNOSIS — R0989 Other specified symptoms and signs involving the circulatory and respiratory systems: Secondary | ICD-10-CM | POA: Insufficient documentation

## 2013-08-05 DIAGNOSIS — I10 Essential (primary) hypertension: Secondary | ICD-10-CM | POA: Insufficient documentation

## 2013-08-05 DIAGNOSIS — J449 Chronic obstructive pulmonary disease, unspecified: Secondary | ICD-10-CM | POA: Insufficient documentation

## 2013-08-05 DIAGNOSIS — I359 Nonrheumatic aortic valve disorder, unspecified: Secondary | ICD-10-CM | POA: Insufficient documentation

## 2013-08-05 DIAGNOSIS — R0609 Other forms of dyspnea: Secondary | ICD-10-CM | POA: Insufficient documentation

## 2013-08-05 DIAGNOSIS — J4489 Other specified chronic obstructive pulmonary disease: Secondary | ICD-10-CM | POA: Insufficient documentation

## 2013-08-05 NOTE — Progress Notes (Signed)
Echocardiogram performed.  

## 2013-08-06 ENCOUNTER — Ambulatory Visit (HOSPITAL_COMMUNITY): Payer: Medicare Other

## 2013-08-08 ENCOUNTER — Ambulatory Visit (HOSPITAL_COMMUNITY): Payer: Medicare Other

## 2013-08-13 ENCOUNTER — Ambulatory Visit (HOSPITAL_COMMUNITY): Payer: Medicare Other

## 2013-08-15 ENCOUNTER — Ambulatory Visit (HOSPITAL_COMMUNITY): Payer: Medicare Other

## 2013-08-20 ENCOUNTER — Ambulatory Visit (HOSPITAL_COMMUNITY): Payer: Medicare Other

## 2013-08-22 ENCOUNTER — Ambulatory Visit (HOSPITAL_COMMUNITY): Payer: Medicare Other

## 2013-08-27 ENCOUNTER — Ambulatory Visit (HOSPITAL_COMMUNITY): Payer: Medicare Other

## 2013-08-29 ENCOUNTER — Ambulatory Visit (HOSPITAL_COMMUNITY): Payer: Medicare Other

## 2013-09-03 ENCOUNTER — Ambulatory Visit (HOSPITAL_COMMUNITY): Payer: Medicare Other

## 2013-09-05 ENCOUNTER — Ambulatory Visit (HOSPITAL_COMMUNITY): Payer: Medicare Other

## 2013-10-14 ENCOUNTER — Ambulatory Visit: Payer: Medicare Other | Admitting: Cardiology

## 2013-12-25 ENCOUNTER — Other Ambulatory Visit: Payer: Self-pay | Admitting: *Deleted

## 2013-12-25 DIAGNOSIS — I359 Nonrheumatic aortic valve disorder, unspecified: Secondary | ICD-10-CM

## 2014-01-19 ENCOUNTER — Ambulatory Visit (HOSPITAL_COMMUNITY)
Admission: RE | Admit: 2014-01-19 | Discharge: 2014-01-19 | Disposition: A | Discharge status: Home / Self Care | Payer: Medicare Other | Source: Ambulatory Visit | Attending: Thoracic Surgery (Cardiothoracic Vascular Surgery) | Admitting: Thoracic Surgery (Cardiothoracic Vascular Surgery)

## 2014-01-19 ENCOUNTER — Ambulatory Visit: Payer: Medicare Other | Admitting: Thoracic Surgery (Cardiothoracic Vascular Surgery)

## 2014-01-19 ENCOUNTER — Other Ambulatory Visit (HOSPITAL_COMMUNITY): Payer: Medicare Other

## 2014-01-19 DIAGNOSIS — I10 Essential (primary) hypertension: Secondary | ICD-10-CM | POA: Diagnosis not present

## 2014-01-19 DIAGNOSIS — I359 Nonrheumatic aortic valve disorder, unspecified: Secondary | ICD-10-CM

## 2014-01-19 DIAGNOSIS — I350 Nonrheumatic aortic (valve) stenosis: Secondary | ICD-10-CM | POA: Diagnosis present

## 2014-01-19 NOTE — Progress Notes (Signed)
Echocardiogram 2D Echocardiogram has been performed.  Dorothey BasemanReel, Zolton Dowson M 01/19/2014, 2:27 PM

## 2014-02-02 ENCOUNTER — Ambulatory Visit: Payer: Medicare Other | Admitting: Thoracic Surgery (Cardiothoracic Vascular Surgery)

## 2014-02-16 ENCOUNTER — Encounter: Payer: Self-pay | Admitting: Thoracic Surgery (Cardiothoracic Vascular Surgery)

## 2014-02-16 ENCOUNTER — Ambulatory Visit (INDEPENDENT_AMBULATORY_CARE_PROVIDER_SITE_OTHER): Payer: Medicare Other | Admitting: Thoracic Surgery (Cardiothoracic Vascular Surgery)

## 2014-02-16 VITALS — BP 149/67 | HR 76 | Resp 20 | Ht 72.0 in | Wt 155.0 lb

## 2014-02-16 DIAGNOSIS — I350 Nonrheumatic aortic (valve) stenosis: Secondary | ICD-10-CM

## 2014-02-16 DIAGNOSIS — Z952 Presence of prosthetic heart valve: Secondary | ICD-10-CM

## 2014-02-16 DIAGNOSIS — Z954 Presence of other heart-valve replacement: Secondary | ICD-10-CM

## 2014-02-16 NOTE — Progress Notes (Signed)
[address]       Samuel [name] [address]             [phone]     CARDIOTHORACIC SURGERY OFFICE NOTE  Referring Provider is Samuel Bunting, MD  PCP is Samuel Ok, MD   HPI:  Patient returns for follow up 1 year s/p transcatheter aortic valve replacement via right anterior mini-thoracotomy direct aortic approach on 01/21/2013.  He was last seen here in the office on 04/14/2013 which is the last time he has seen any physician.  He just had his 80th birthday and he reports that he is doing fairly well. He states that he feels dramatically better than he did prior to having aortic valve replacement. He states that he remains weak, but he is living alone and he remains functionally independent. He states that he gets some shortness of breath with activity, but this is very mild and his limitations primarily related to generalized weakness. He also has problems with chronic pain in his back. He has some instability of gait but he has not experienced any falls recently. He has not had any syncopal episodes.  He denies any problems with resting shortness of breath, PND, orthopnea, or lower extremity edema. He states that his appetite is quite good. His weight is stable.  He states that he has not been driving his automobile recently because it broke down. He plans to purchase another one. He states that he has not been taking aspirin on a regular basis because "I bleed like a stuck pig"   Current Outpatient Prescriptions  Medication Sig Dispense Refill  . ALPRAZolam (XANAX) 0.5 MG tablet Take 0.5 mg by mouth 2 (two) times daily as needed for anxiety.     Marland Kitchen amLODipine (NORVASC) 10 MG tablet Take 0.5 tablets (5 mg total) by mouth daily.    Marland Kitchen Bioflavonoid Products (ESTER C PO) Take 1,000 mg by mouth daily.    . Cholecalciferol (VITAMIN D-3) 5000 UNITS TABS Take 1 capsule by mouth.    . Cyanocobalamin (VITAMIN B-12 SL) Place 1 tablet under the tongue daily.    . furosemide  (LASIX) 20 MG tablet Take 10 mg by mouth daily.    . magnesium oxide (MAG-OX) 400 MG tablet Take 400 mg by mouth daily.    . metoprolol tartrate (LOPRESSOR) 25 MG tablet Take 0.5 tablets (12.5 mg total) by mouth 2 (two) times daily. (Patient taking differently: Take 12.5 mg by mouth 2 (two) times daily. Takes once a daily most the time per pt) 30 tablet 6  . Multiple Vitamin (MULTIVITAMIN WITH MINERALS) TABS Take 1 tablet by mouth daily. 30 tablet 0  . POTASSIUM GLUCONATE ER PO Take by mouth daily. OTC    . SELENIUM PO Take 1 tablet by mouth daily.    . traMADol (ULTRAM) 50 MG tablet TAKE 1 TABLET BY MOUTH TWICE DAILY AS NEEDED FOR PAIN 60 tablet 0  . vitamin B-12 (CYANOCOBALAMIN) 100 MCG tablet Take 50 mcg by mouth daily.      Marland Kitchen ZINC CITRATE-PHYTASE PO Take by mouth.     No current facility-administered medications for this visit.   Facility-Administered Medications Ordered in Other Visits  Medication Dose Route Frequency Provider Last Rate Last Dose  . 0.9 %  sodium chloride infusion  1 mL/kg/hr Intravenous Continuous Samuel Bunting, MD          Physical Exam:   BP 149/67 mmHg  Pulse 76  Resp 20  Ht 6' (1.829 m)  Wt 155 lb (70.308 kg)  BMI 21.02 kg/m2  SpO2 94%  General:  Elderly and frail  Chest:   Clear to auscultation  CV:   Regular rate and rhythm with blowing systolic murmur. Faint grade I-II early diastolic murmur at LLSB  Incisions:  Completely healed  Abdomen:  Soft nontender  Extremities:  Warm and well perfused, no lower extremity edema  Diagnostic Tests:  Transthoracic Echocardiography  Patient:  Samuel [name], [name] #:    [id number] Study Date: 01/19/2014 Gender:   M Age:    89 Height:   182.9 cm Weight:   65.8 kg BSA:    1.82 m^2 Pt. Status: Room:  ATTENDING  Samuel [name], M.D. ORDERING   Samuel [name], M.D. REFERRING  Samuel [name], M.D. REFERRING  Samuel [name], Samuel [phone] SONOGRAPHER Samuel [name], RDCS PERFORMING   [facility], Outpatient  cc:  ------------------------------------------------------------------- LV EF: 60% -  65%  ------------------------------------------------------------------- Indications:   Aortic stenosis 424.1.  ------------------------------------------------------------------- History:  PMH: S/P TAVR. Dyspnea and murmur. Risk factors: Hypertension.  ------------------------------------------------------------------- Study Conclusions  - Left ventricle: The cavity size was normal. Systolic function was normal. The estimated ejection fraction was in the range of 60% to 65%. Wall motion was normal; there were no regional wall motion abnormalities. Doppler parameters are consistent with abnormal left ventricular relaxation (grade 1 diastolic dysfunction). - Aortic valve: There is an Edward-Sapien transcatheter valve in place. No central aortic insufficiency is present. There is mild to moderate paravalvular leak that is unchanged from the prior study. - Mitral valve: Calcified annulus. Mildly thickened, moderately calcified leaflets .  Impressions:  - Compared to the prior study, there has been no significant interval change.  ------------------------------------------------------------------- Labs, prior tests, procedures, and surgery: Echo done 08-05-2013 reported an EF of 65-70%     Transthoracic echocardiography. M-mode, complete 2D, spectral Doppler, and color Doppler. Birthdate: Patient birthdate: 1926/11/29. Age: Patient is 80 yr old. Sex: Gender: male.  BMI: 19.7 kg/m^2. Blood pressure:   117/61 Patient status: Outpatient. Study date: Study date: 01/19/2014. Study time: 02:31 PM. Location: Echo laboratory.  -------------------------------------------------------------------  ------------------------------------------------------------------- Left ventricle: The cavity size was normal. Systolic function  was normal. The estimated ejection fraction was in the range of 60% to 65%. Wall motion was normal; there were no regional wall motion abnormalities. Doppler parameters are consistent with abnormal left ventricular relaxation (grade 1 diastolic dysfunction).  ------------------------------------------------------------------- Aortic valve: There is an Edward-Sapien transcatheter valve in place. No central aortic insufficiency is present. There is mild to moderate paravalvular leak that is unchanged from the prior study. Doppler: Transvalvular velocity was within the normal range. VTI ratio of LVOT to aortic valve: 0.65. Valve area (VTI): 1.85 cm^2. Indexed valve area (VTI): 1.02 cm^2/m^2. Valve area (Vmax): 1.41 cm^2. Indexed valve area (Vmax): 0.78 cm^2/m^2. Mean velocity ratio of LVOT to aortic valve: 0.6. Valve area (Vmean): 1.71 cm^2. Indexed valve area (Vmean): 0.94 cm^2/m^2.  Mean gradient (S): 6 mm Hg. Peak gradient (S): 13 mm Hg.  ------------------------------------------------------------------- Aorta: The aorta was normal, not dilated, and non-diseased.  ------------------------------------------------------------------- Mitral valve:  Calcified annulus. Mildly thickened, moderately calcified leaflets . Leaflet separation was normal. Doppler: Transvalvular velocity was within the normal range. There was no evidence for stenosis. There was no regurgitation.  Peak gradient (D): 4 mm Hg.  ------------------------------------------------------------------- Left atrium: The atrium was normal in size.  ------------------------------------------------------------------- Right ventricle: The cavity size was normal. Wall thickness was normal. Systolic function was normal.  ------------------------------------------------------------------- Pulmonic valve:  Poorly visualized. Structurally normal valve. Cusp separation was normal. Doppler: Transvalvular velocity  was within the normal range. There was no regurgitation.  ------------------------------------------------------------------- Tricuspid valve:  Structurally normal valve.  Leaflet separation was normal. Doppler: Transvalvular velocity was within the normal range. There was no regurgitation.  ------------------------------------------------------------------- Pulmonary artery:  Poorly visualized. The main pulmonary artery was normal-sized.  ------------------------------------------------------------------- Right atrium: The atrium was normal in size.  ------------------------------------------------------------------- Pericardium: The pericardium was normal in appearance. There was no pericardial effusion.  ------------------------------------------------------------------- Systemic veins: Inferior vena cava: The vessel was normal in size. The respirophasic diameter changes were in the normal range (= 50%), consistent with normal central venous pressure.  ------------------------------------------------------------------- Post procedure conclusions Ascending Aorta:  - The aorta was normal, not dilated, and non-diseased.  ------------------------------------------------------------------- Measurements  Left ventricle              Value     Reference LV ID, ED, PLAX chordal      (L)   36.7 mm    43 - 52 LV ID, ES, PLAX chordal          24.4 mm    23 - 38 LV fx shortening, PLAX chordal      34  %    >=29 LV PW thickness, ED            9.28 mm    --------- IVS/LV PW ratio, ED            1.27      <=1.3 Stroke volume, 2D             76  ml    --------- Stroke volume/bsa, 2D           42  ml/m^2  --------- LV e&', lateral              6.5  cm/s   --------- LV E/e&', lateral             14.98     --------- LV e&',  medial               4.7  cm/s   --------- LV E/e&', medial              20.72     --------- LV e&', average              5.6  cm/s   --------- LV E/e&', average             17.39     ---------  Ventricular septum            Value     Reference IVS thickness, ED             11.8 mm    ---------  LVOT                   Value     Reference LVOT ID, S                19  mm    --------- LVOT area                 2.84 cm^2   --------- LVOT peak velocity, S           88.1 cm/s   --------- LVOT mean velocity, S           67.6 cm/s   --------- LVOT VTI, S  26.9 cm    --------- LVOT peak gradient, S           3   mm Hg  ---------  Aortic valve               Value     Reference Aortic valve mean velocity, S       112  cm/s   --------- Aortic valve VTI, S            41.3 cm    --------- Aortic mean gradient, S          6   mm Hg  --------- Aortic peak gradient, S          13  mm Hg  --------- VTI ratio, LVOT/AV            0.65      --------- Aortic valve area, VTI          1.85 cm^2   --------- Aortic valve area/bsa, VTI        1.02 cm^2/m^2 --------- Aortic valve area, peak velocity     1.41 cm^2   --------- Aortic valve area/bsa, peak        0.78 cm^2/m^2 --------- velocity Velocity ratio, mean, LVOT/AV       0.6      --------- Aortic valve area, mean velocity     1.71 cm^2   --------- Aortic valve area/bsa, mean        0.94 cm^2/m^2 --------- velocity  Aorta                   Value     Reference Aortic root ID, ED            30  mm     ---------  Left atrium                Value     Reference LA ID, A-P, ES              29  mm    --------- LA ID/bsa, A-P              1.6  cm/m^2  <=2.2 LA volume, ES, 1-p A4C          47.8 ml    --------- LA volume/bsa, ES, 1-p A4C        26.3 ml/m^2  ---------  Mitral valve               Value     Reference Mitral E-wave peak velocity        97.4 cm/s   --------- Mitral A-wave peak velocity        162  cm/s   --------- Mitral deceleration time     (H)   306  ms    150 - 230 Mitral peak gradient, D          4   mm Hg  --------- Mitral E/A ratio, peak          0.6      ---------  Legend: (L) and (H) mark values outside specified reference range.  ------------------------------------------------------------------- Prepared and Electronically Authenticated by  Donato Schultz, M.D. 2015-10-19T17:51:47   Impression:  Patient is clinically doing remarkably well more than one year status post transcatheter aortic valve replacement using a 29 mm Edwards Sapien XT transcatheter heart valve via direct aortic approach.  He describes stable symptoms of exertional shortness of breath and fatigue consistent with chronic diastolic congestive heart failure,  New York Heart Association functional class II. The patient states that he feels much better and can breathe much better than he did prior to surgery.  Given his multiple medical problems and extremely debilitated condition prior to surgery, he looks remarkably good.  Late follow-up echocardiogram looks stable with normal left ventricular size and systolic function. The patient does have mild to moderate paravalvular leak.    Plan:  I have encouraged the patient to take enteric-coated aspirin on a daily basis. I suggested that he probably should consider giving up driving an  automobile given his frail physical condition. I reminded him to schedule follow-up appointment with Dr. Jens Somrenshaw at some point within the next few months. We will plan to see him back in 1 year to see how he is getting along.   I spent in excess of 15 minutes during the conduct of this office consultation and >50% of this time involved direct face-to-face encounter with the patient for counseling and/or coordination of their care.   Salvatore Decentlarence H. Cornelius Moraswen, MD 02/16/2014 5:01 PM

## 2014-02-16 NOTE — Patient Instructions (Signed)
Begin taking aspirin on a daily basis, enteric coated might be preferable.

## 2014-03-12 ENCOUNTER — Encounter (HOSPITAL_COMMUNITY): Payer: Self-pay | Admitting: Cardiology

## 2014-04-20 ENCOUNTER — Other Ambulatory Visit (HOSPITAL_COMMUNITY): Payer: Self-pay | Admitting: Cardiology

## 2014-04-20 DIAGNOSIS — I714 Abdominal aortic aneurysm, without rupture, unspecified: Secondary | ICD-10-CM

## 2014-04-23 ENCOUNTER — Ambulatory Visit (HOSPITAL_COMMUNITY): Payer: Medicare Other | Attending: Cardiology | Admitting: Cardiology

## 2014-04-23 DIAGNOSIS — J449 Chronic obstructive pulmonary disease, unspecified: Secondary | ICD-10-CM | POA: Diagnosis not present

## 2014-04-23 DIAGNOSIS — I10 Essential (primary) hypertension: Secondary | ICD-10-CM | POA: Diagnosis not present

## 2014-04-23 DIAGNOSIS — Z87891 Personal history of nicotine dependence: Secondary | ICD-10-CM | POA: Insufficient documentation

## 2014-04-23 DIAGNOSIS — I714 Abdominal aortic aneurysm, without rupture, unspecified: Secondary | ICD-10-CM

## 2014-04-23 NOTE — Progress Notes (Signed)
Abdominal Aorta Duplex performed  

## 2014-04-30 ENCOUNTER — Other Ambulatory Visit: Payer: Self-pay | Admitting: Physician Assistant

## 2014-05-07 ENCOUNTER — Ambulatory Visit: Payer: Medicare Other | Admitting: Cardiology

## 2014-07-07 ENCOUNTER — Ambulatory Visit: Payer: Medicare Other | Admitting: Cardiology

## 2014-07-27 ENCOUNTER — Encounter: Payer: Self-pay | Admitting: Cardiology

## 2014-08-17 ENCOUNTER — Ambulatory Visit (INDEPENDENT_AMBULATORY_CARE_PROVIDER_SITE_OTHER): Payer: Medicare Other | Admitting: Cardiology

## 2014-08-17 ENCOUNTER — Encounter: Payer: Self-pay | Admitting: Cardiology

## 2014-08-17 VITALS — BP 130/60 | HR 72 | Ht 72.0 in | Wt 157.4 lb

## 2014-08-17 DIAGNOSIS — I350 Nonrheumatic aortic (valve) stenosis: Secondary | ICD-10-CM

## 2014-08-17 MED ORDER — ASPIRIN EC 325 MG PO TBEC: 325.0000 mg | DELAYED_RELEASE_TABLET | ORAL | Status: DC

## 2014-08-17 NOTE — Progress Notes (Signed)
Cardiology Office Note   Date:  08/17/2014   ID:  BHAVYA GRAND, DOB [birth year], MRN [id number]  PCP:  Ralene Ok, MD  Cardiologist:  Dr. Jens Som    Chief Complaint  Patient presents with  . Aortic Stenosis    No complaints of chest pain.  Occas. SOB, edema and stays dizzy.        History of Present Illness: Samuel [name] is a 81 y.o. male who presents for  hx of severe AS, HTN, ETOH abuse. He had previously refused AVR. He was admitted 10/2011 with volume overload and symptomatic AS. LHC demonstrated luminal irregs but no significant CAD. Echo 10/2011: Mild LVH, EF 60-65%, Gr 1 DD, mod AS, mean 33 (this was felt to be worse than measured), MAC, PASP 32. He was seen in 10/2012 with worsening symptoms and follow up echo demonstrated normal LVF and severe AS with mean gradient of 78 mmHg. He was referred to Dr. Cornelius Moras for aortic valve surgery. Note, patient has severe tortuosity of his aorta and small infrarenal abdominal aortic aneurysm measuring 3.7 cm by CT 7/13.  Most recent AAA duplex was 04/23/14 and AAA 3.0 X 2.8 stable.-- pt does not take amlodipine and has not taken since surgery.    He was not felt to be a candidate for TAVR via transfemoral approach. It was felt that he would need a direct aortic approach. He was admitted 10/14 and underwent transaortic valve replacement via transaortic approach via right anterior minithoracotomy. Follow up echo (02/21/13): Mild LVH, EF 55-60%, aortic valve with mild to moderate perivalvular leak, AI, mean aortic valve gradient 6 mm of mercury, MAC. Since he was last seen, he continues to have some weakness but improving. Mild dyspnea on exertion. No orthopnea, PND or chest pain. Mild pedal edema.  He has been instructed to take ASA but is afraid to take due to fear of bleeding.  We decided on EC ASA 325 every other day.  He continues to follow with Dr. Cornelius Moras.   Last echo  01/2014: Study Conclusions  - Left ventricle: The cavity size was normal.  Systolic function was normal. The estimated ejection fraction was in the range of 60% to 65%. Wall motion was normal; there were no regional wall motion abnormalities. Doppler parameters are consistent with abnormal left ventricular relaxation (grade 1 diastolic dysfunction). - Aortic valve: There is an Edward-Sapien transcatheter valve in place. No central aortic insufficiency is present. There is mild to moderate paravalvular leak that is unchanged from the prior study. - Mitral valve: Calcified annulus. Mildly thickened, moderately calcified leaflets .  Impressions:  - Compared to the prior study, there has been no significant interval change  Past Medical History  Diagnosis Date  . Depression   . COPD (chronic obstructive pulmonary disease)   . Aortic stenosis     Echo 7/14:  Severe LVH, EF 55-60%, severe AS, mean gradient 78 mmHg, MAC, mild LAE  . Essential hypertension, benign   . Decreased libido   . DIZZINESS, CHRONIC   . Alcohol abuse   . Shortness of breath 10/24/11    "all the time"  . Arthritis     "in the low vertebra"  . Chronic lower back pain   . Scoliosis/kyphoscoliosis 10/28/2011  . Complication of anesthesia     NO NECK LINES ON RIGHT FOR SURGERY  . Heart murmur   . GERD (gastroesophageal reflux disease)     occ  . Abdominal aortic aneurysm 10/29/2011    3.7cm  AAA by CT scan with unusual appearance suggestive of penetrating atherosclerotic ulcer or localized dissection  . CHF (congestive heart failure)   . 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 Perivalvular leak of prosthetic heart valve     echo (02/21/13): Mild LVH, EF 55-60%, aortic valve with mild to moderate perivalvular leak, AI, mean aortic valve gradient 6 mm of mercury, MAC.    Past Surgical History  Procedure Laterality  Date  . No past surgeries    . Transcatheter aortic valve replacement, transaortic N/A 01/21/2013    Procedure: TRANSCATHETER AORTIC VALVE REPLACEMENT, TRANSAORTIC;  Surgeon: Purcell Nailslarence H Owen, MD;  Location: [facility] OR;  Service: Open Heart Surgery;  Laterality: N/A;  . Intraoperative transesophageal echocardiogram N/A 01/21/2013    Procedure: INTRAOPERATIVE TRANSESOPHAGEAL ECHOCARDIOGRAM;  Surgeon: Purcell Nailslarence H Owen, MD;  [facility];  Service: Open Heart Surgery;  Laterality: N/A;  . Thoracotomy Right 01/21/2013    Procedure: MINI/LIMITED THORACOTOMY;  Surgeon: Purcell Nailslarence H Owen, MD;  [facility];  Service: Open Heart Surgery;  Laterality: Right;  . Left and right heart catheterization with coronary angiogram N/A 10/27/2011    Procedure: LEFT AND RIGHT HEART CATHETERIZATION WITH CORONARY ANGIOGRAM;  Surgeon: Herby Abrahamhomas D Stuckey, MD;  [facility];  Service: Cardiovascular;  Laterality: N/A;     Current Outpatient Prescriptions  Medication Sig Dispense Refill  . ALPRAZolam (XANAX) 0.5 MG tablet Take 0.5 mg by mouth 2 (two) times daily as needed for anxiety.     Marland Kitchen. Bioflavonoid Products (ESTER C PO) Take 1,000 mg by mouth daily.    . Cholecalciferol (VITAMIN D-3) 5000 UNITS TABS Take 1 capsule by mouth.    . Cyanocobalamin (VITAMIN B-12 SL) Place 1 tablet under the tongue daily.    . furosemide (LASIX) 20 MG tablet Take 10 mg by mouth daily.    . magnesium oxide (MAG-OX) 400 MG tablet Take 400 mg by mouth daily.    . metoprolol tartrate (LOPRESSOR) 25 MG tablet TAKE 1/2 TABLET BY MOUTH TWICE DAILY 30 tablet 0  . Multiple Vitamin (MULTIVITAMIN WITH MINERALS) TABS Take 1 tablet by mouth daily. 30 tablet 0  . POTASSIUM GLUCONATE ER PO Take by mouth daily. OTC    . SELENIUM PO Take 1 tablet by mouth daily.    . traMADol (ULTRAM) 50 MG tablet TAKE 1 TABLET BY MOUTH TWICE DAILY AS NEEDED FOR PAIN 60 tablet 0  . vitamin B-12 (CYANOCOBALAMIN) 100 MCG tablet Take 50 mcg by mouth daily.      Marland Kitchen.  ZINC CITRATE-PHYTASE PO Take by mouth.    Marland Kitchen. aspirin EC 325 MG tablet Take 1 tablet (325 mg total) by mouth every other day. 30 tablet 0   No current facility-administered medications for this visit.   Facility-Administered Medications Ordered in Other Visits  Medication Dose Route Frequency Provider Last Rate Last Dose  . 0.9 %  sodium chloride infusion  1 mL/kg/hr Intravenous Continuous Lewayne BuntingBrian S Crenshaw, MD        Allergies:   Review of patient's allergies indicates no known allergies.    Social History:  The patient  reports that he quit smoking about 62 years ago. His smoking use included Cigarettes. He quit after 2.5 years of use. He has never used smokeless tobacco. He reports that he drinks about 37.8 oz of alcohol per week. He reports that he  does not use illicit drugs.   Family History:  The patient's family history includes Diabetes in his brother and sister; Heart attack (age of onset: 9551) in his brother; Heart attack (age of onset: 6161) in his brother; Heart attack (age of onset: 6063) in his father; Heart failure (age of onset: 3785) in his mother; Hypertension in his sister; Other in an other family member; Stroke in his father; Thyroid disease in his mother.    ROS:  General:no colds or fevers, no weight changes Skin:no rashes or ulcers HEENT:no blurred vision, no congestion CV:see HPI PUL:see HPI GI:no diarrhea constipation or melena, no indigestion GU:no hematuria, no dysuria MS:no joint pain, no claudication Neuro:no syncope, no lightheadedness Endo:no diabetes, no thyroid disease  Wt Readings from Last 3 Encounters:  08/17/14 157 lb 6.4 oz (71.396 kg)  02/16/14 155 lb (70.308 kg)  04/14/13 145 lb (65.772 kg)     PHYSICAL EXAM: VS:  BP 130/60 mmHg  Pulse 72  Ht 6' (1.829 m)  Wt 157 lb 6.4 oz (71.396 kg)  BMI 21.34 kg/m2 , BMI Body mass index is 21.34 kg/(m^2). General:Pleasant affect, NAD, frail in appearance Skin:Warm and dry, brisk capillary  refill HEENT:normocephalic, sclera clear, mucus membranes moist Neck:supple, no JVD, no bruits  Heart:S1S2 RRR with 2/6 systolic murmuand early diastolic murmur. no gallup, rub or click Lungs:clear without rales, rhonchi, or wheezes ZOX:WRUEAbd:soft, non tender, + BS, do not palpate liver spleen or masses Ext:no lower ext edema, 2+ pedal pulses, 2+ radial pulses Neuro:alert and oriented, MAE, follows commands, + facial symmetry    EKG:  EKG is ordered today. The ekg ordered today demonstrates SR at 66 no acute changes otherwise.  Mildly longer Qtc.   Recent Labs: No results found for requested labs within last 365 days.    Lipid Panel    Component Value Date/Time   CHOL 141 08/28/2007 0830   TRIG 57 08/28/2007 0830   HDL 32.7* 08/28/2007 0830   CHOLHDL 4.3 CALC 08/28/2007 0830   VLDL 11 08/28/2007 0830   LDLCALC 97 08/28/2007 0830       Other studies Reviewed: Additional studies/ records that were reviewed today include: previous notes and echo and AAA duplex..   ASSESSMENT AND PLAN:  1.  S/p TAVR --Continue SBE prophylaxis. He has mild to moderate perivalvular aortic insufficiency. Plan repeat echocardiogram in Oct 2016. Conservative measures if possible. He is not taking his aspirin. I've asked him to resume 325 mg every other day and he agreed.   Patient has improved since his valve was replaced but continues to have some weakness, though he is still able to walk to his garden and work some.  He will follow with Dr. Velta AddisonB. Crenshaw in 6 months.  Pt will call if he develops increasing SOB.  2. HTN  Stable.  3.  AAA.stable on last duplex to repeat  04/2015  Current medicines are reviewed with the patient today.  The patient Has no concerns regarding medicines.  The following changes have been made:  See above Labs/ tests ordered today include:see above  Disposition:   FU:  see above  Nyoka LintSigned, INGOLD,LAURA R, NP  08/17/2014 5:39 PM    [facility] [address], [place], [place]  [address]/ [address] [place], [place] Phone: [phone]; Fax: [phone]  [phone]

## 2014-08-17 NOTE — Patient Instructions (Signed)
START Aspirin enteric coated 325mg  every other day.  Your physician has requested that you have an echocardiogram October 2016. Echocardiography is a painless test that uses sound waves to create images of your heart. It provides your doctor with information about the size and shape of your heart and how well your heart's chambers and valves are working. This procedure takes approximately one hour. There are no restrictions for this procedure.  Your physician recommends that you schedule a follow-up appointment in: October with Dr. Jens Somrenshaw.

## 2014-08-24 NOTE — Addendum Note (Signed)
Addended by: Neta EhlersRUITT, ANGELA M on: 08/24/2014 11:24 AM   Modules accepted: Orders

## 2014-09-28 ENCOUNTER — Other Ambulatory Visit: Payer: Self-pay

## 2015-01-11 ENCOUNTER — Ambulatory Visit (HOSPITAL_COMMUNITY): Payer: Medicare Other | Attending: Internal Medicine

## 2015-02-15 ENCOUNTER — Encounter: Payer: Self-pay | Admitting: Gastroenterology

## 2015-02-22 ENCOUNTER — Inpatient Hospital Stay (HOSPITAL_COMMUNITY)
Admission: EM | Admit: 2015-02-22 | Discharge: 2015-02-26 | DRG: 640 | Disposition: A | Discharge status: Skilled Nursing Facility | Payer: Medicare Other | Attending: Internal Medicine | Admitting: Internal Medicine

## 2015-02-22 ENCOUNTER — Encounter (HOSPITAL_COMMUNITY): Payer: Self-pay

## 2015-02-22 DIAGNOSIS — I509 Heart failure, unspecified: Secondary | ICD-10-CM | POA: Diagnosis present

## 2015-02-22 DIAGNOSIS — M419 Scoliosis, unspecified: Secondary | ICD-10-CM | POA: Diagnosis present

## 2015-02-22 DIAGNOSIS — E860 Dehydration: Secondary | ICD-10-CM | POA: Diagnosis not present

## 2015-02-22 DIAGNOSIS — F1010 Alcohol abuse, uncomplicated: Secondary | ICD-10-CM | POA: Diagnosis present

## 2015-02-22 DIAGNOSIS — R112 Nausea with vomiting, unspecified: Secondary | ICD-10-CM | POA: Diagnosis present

## 2015-02-22 DIAGNOSIS — R740 Nonspecific elevation of levels of transaminase and lactic acid dehydrogenase [LDH]: Secondary | ICD-10-CM | POA: Diagnosis present

## 2015-02-22 DIAGNOSIS — I714 Abdominal aortic aneurysm, without rupture: Secondary | ICD-10-CM | POA: Diagnosis present

## 2015-02-22 DIAGNOSIS — Z87891 Personal history of nicotine dependence: Secondary | ICD-10-CM

## 2015-02-22 DIAGNOSIS — F10230 Alcohol dependence with withdrawal, uncomplicated: Secondary | ICD-10-CM | POA: Diagnosis present

## 2015-02-22 DIAGNOSIS — F329 Major depressive disorder, single episode, unspecified: Secondary | ICD-10-CM | POA: Diagnosis present

## 2015-02-22 DIAGNOSIS — Z6822 Body mass index (BMI) 22.0-22.9, adult: Secondary | ICD-10-CM

## 2015-02-22 DIAGNOSIS — Z79899 Other long term (current) drug therapy: Secondary | ICD-10-CM

## 2015-02-22 DIAGNOSIS — E878 Other disorders of electrolyte and fluid balance, not elsewhere classified: Secondary | ICD-10-CM | POA: Diagnosis present

## 2015-02-22 DIAGNOSIS — F10220 Alcohol dependence with intoxication, uncomplicated: Secondary | ICD-10-CM | POA: Diagnosis present

## 2015-02-22 DIAGNOSIS — E43 Unspecified severe protein-calorie malnutrition: Secondary | ICD-10-CM | POA: Diagnosis present

## 2015-02-22 DIAGNOSIS — G8929 Other chronic pain: Secondary | ICD-10-CM | POA: Diagnosis present

## 2015-02-22 DIAGNOSIS — M4680 Other specified inflammatory spondylopathies, site unspecified: Secondary | ICD-10-CM | POA: Diagnosis present

## 2015-02-22 DIAGNOSIS — K7010 Alcoholic hepatitis without ascites: Secondary | ICD-10-CM

## 2015-02-22 DIAGNOSIS — R110 Nausea: Secondary | ICD-10-CM

## 2015-02-22 DIAGNOSIS — K219 Gastro-esophageal reflux disease without esophagitis: Secondary | ICD-10-CM | POA: Diagnosis present

## 2015-02-22 DIAGNOSIS — Z952 Presence of prosthetic heart valve: Secondary | ICD-10-CM

## 2015-02-22 DIAGNOSIS — I350 Nonrheumatic aortic (valve) stenosis: Secondary | ICD-10-CM | POA: Diagnosis present

## 2015-02-22 DIAGNOSIS — M545 Low back pain: Secondary | ICD-10-CM | POA: Diagnosis present

## 2015-02-22 DIAGNOSIS — I4581 Long QT syndrome: Secondary | ICD-10-CM | POA: Diagnosis present

## 2015-02-22 DIAGNOSIS — I110 Hypertensive heart disease with heart failure: Secondary | ICD-10-CM | POA: Diagnosis present

## 2015-02-22 DIAGNOSIS — J449 Chronic obstructive pulmonary disease, unspecified: Secondary | ICD-10-CM | POA: Diagnosis present

## 2015-02-22 DIAGNOSIS — I10 Essential (primary) hypertension: Secondary | ICD-10-CM | POA: Diagnosis present

## 2015-02-22 DIAGNOSIS — Z7982 Long term (current) use of aspirin: Secondary | ICD-10-CM

## 2015-02-22 DIAGNOSIS — E871 Hypo-osmolality and hyponatremia: Secondary | ICD-10-CM | POA: Diagnosis present

## 2015-02-22 MED ORDER — SODIUM CHLORIDE 0.9 % IV SOLN
8.0000 mg | Freq: Once | INTRAVENOUS | Status: AC
  Administered 2015-02-23: 8 mg via INTRAVENOUS
  Filled 2015-02-22: qty 4

## 2015-02-22 MED ORDER — SODIUM CHLORIDE 0.9 % IV BOLUS (SEPSIS)
500.0000 mL | Freq: Once | INTRAVENOUS | Status: AC
  Administered 2015-02-23: 500 mL via INTRAVENOUS

## 2015-02-22 NOTE — ED Notes (Addendum)
PT RECEIVED VIA EMS C/O NAUSEA X2-3 WEEKS AGO. PT STATES HE DRANK 3 40OZ  BEERS TO GET RID OF THE NAUSEA, BUT THE BEER MADE THE NAUSEA WORSE. PT DENIES DIARRHEA OR ABD PAIN AT THIS TIME. ZOFRAN 4MG  IV X1 PTA.

## 2015-02-22 NOTE — ED Provider Notes (Signed)
CSN: [id number]     Arrival date & time 02/22/15  2231 History   First MD Initiated Contact with Patient 02/22/15 2323     Chief Complaint  Patient presents with  . Nausea    X2-3 WEEKS     (Consider location/radiation/quality/duration/timing/severity/associated sxs/prior Treatment) HPI   Samuel [name] is a 81 y.o. male, with a history of alcoholism, COPD, GERD, CHF, aortic stenosis, aortic valve replacement, and heart catheterization in 2013, presenting to the ED with nausea that came on 2-3 weeks ago. Pt admits to drinking 3x 40oz beers PTA to try to get rid of the nausea. Patient states he usually drinks more than this every day, but lately has been trying to cut back. Patient seems to mean that he varies the amount of alcohol he drinks on any given day. Pt denies vomiting, abdominal pain, fever/chills, shortness of breath, chest pain, or any other pain or complaints. Pt states he doesn't eat much on a daily basis.     Past Medical History  Diagnosis Date  . Depression   . COPD (chronic obstructive pulmonary disease) (HCC)   . Aortic stenosis     Echo 7/14:  Severe LVH, EF 55-60%, severe AS, mean gradient 78 mmHg, MAC, mild LAE  . Essential hypertension, benign   . Decreased libido   . DIZZINESS, CHRONIC   . Alcohol abuse   . Shortness of breath 10/24/11    "all the time"  . Arthritis     "in the low vertebra"  . Chronic lower back pain   . Scoliosis/kyphoscoliosis 10/28/2011  . Complication of anesthesia     NO NECK LINES ON RIGHT FOR SURGERY  . Heart murmur   . GERD (gastroesophageal reflux disease)     occ  . Abdominal aortic aneurysm (HCC) 10/29/2011    3.7cm AAA by CT scan with unusual appearance suggestive of penetrating atherosclerotic ulcer or localized dissection  . CHF (congestive heart failure) (HCC)   . 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 Perivalvular leak of prosthetic heart valve     echo (02/21/13): Mild LVH, EF 55-60%, aortic valve with mild to moderate perivalvular leak, AI, mean aortic valve gradient 6 mm of mercury, MAC.   Past Surgical History  Procedure Laterality Date  . No past surgeries    . Transcatheter aortic valve replacement, transaortic N/A 01/21/2013    Procedure: TRANSCATHETER AORTIC VALVE REPLACEMENT, TRANSAORTIC;  Surgeon: Purcell Nails, MD;  Location: [facility] OR;  Service: Open Heart Surgery;  Laterality: N/A;  . Intraoperative transesophageal echocardiogram N/A 01/21/2013    Procedure: INTRAOPERATIVE TRANSESOPHAGEAL ECHOCARDIOGRAM;  Surgeon: Purcell Nails, MD;  Location: [facility] OR;  Service: Open Heart Surgery;  Laterality: N/A;  . Thoracotomy Right 01/21/2013    Procedure: MINI/LIMITED THORACOTOMY;  Surgeon: Purcell Nails, MD;  Location: [facility] OR;  Service: Open Heart Surgery;  Laterality: Right;  . Left and right heart catheterization with coronary angiogram N/A 10/27/2011    Procedure: LEFT AND RIGHT HEART CATHETERIZATION WITH CORONARY ANGIOGRAM;  Surgeon: Herby Abraham, MD;  Location: [facility] CATH LAB;  Service: Cardiovascular;  Laterality: N/A;   Family History  Problem Relation Age of Onset  . Other      no known medical hx of heart disease  . Heart attack Father 52  . Stroke Father   . Heart failure Mother 10  .  Thyroid disease Mother   . Heart attack Brother 51  . Diabetes Brother   . Heart attack Brother 44  . Diabetes Sister   . Hypertension Sister    Social History  Substance Use Topics  . Smoking status: Former Smoker -- 2.5 years    Types: Cigarettes    Quit date: 07/02/1952  . Smokeless tobacco: Never Used     Comment: wine x2 q nite in last week 01/17/13  . Alcohol Use: 37.8 oz/week    42 Glasses of wine, 21 Cans of beer per week     Comment: 10/24/11 "drink q day; quite a bit"; Started 1974 (mostly hard liquor)    Review of Systems   Constitutional: Negative for fever, chills, diaphoresis and fatigue.  Respiratory: Negative for shortness of breath.   Cardiovascular: Negative for chest pain.  Gastrointestinal: Positive for nausea. Negative for vomiting, abdominal pain, diarrhea and constipation.  Neurological: Negative for dizziness, syncope, weakness, light-headedness and numbness.  All other systems reviewed and are negative.     Allergies  Review of patient's allergies indicates no known allergies.  Home Medications   Prior to Admission medications   Medication Sig Start Date End Date Taking? Authorizing Provider  ALPRAZolam Prudy Feeler) 0.5 MG tablet Take 0.5 mg by mouth 2 (two) times daily as needed for anxiety.  10/09/12  Yes Historical Provider, MD  Bioflavonoid Products (ESTER C PO) Take 1,000 mg by mouth daily.   Yes Historical Provider, MD  Cholecalciferol (VITAMIN D-3) 5000 UNITS TABS Take 1 capsule by mouth.   Yes Historical Provider, MD  Cyanocobalamin (VITAMIN B-12 SL) Place 1 tablet under the tongue daily.   Yes Historical Provider, MD  furosemide (LASIX) 20 MG tablet Take 10 mg by mouth daily.   Yes Historical Provider, MD  metoprolol tartrate (LOPRESSOR) 25 MG tablet Take 12.5 mg by mouth daily.   Yes Historical Provider, MD  Multiple Vitamin (MULTIVITAMIN WITH MINERALS) TABS Take 1 tablet by mouth daily. 10/30/11  Yes Penny Pia, MD  POTASSIUM GLUCONATE ER PO Take by mouth daily. OTC   Yes Historical Provider, MD  SELENIUM PO Take 1 tablet by mouth daily.   Yes Historical Provider, MD  traMADol (ULTRAM) 50 MG tablet TAKE 1 TABLET BY MOUTH TWICE DAILY AS NEEDED FOR PAIN 02/25/13  Yes Tiffany L Reed, DO  vitamin B-12 (CYANOCOBALAMIN) 100 MCG tablet Take 50 mcg by mouth daily.     Yes Historical Provider, MD  ZINC CITRATE-PHYTASE PO Take by mouth.   Yes Historical Provider, MD  aspirin EC 325 MG tablet Take 1 tablet (325 mg total) by mouth every other day. Patient not taking: Reported on 02/22/2015 08/17/14    Lewayne Bunting, MD  metoprolol tartrate (LOPRESSOR) 25 MG tablet TAKE 1/2 TABLET BY MOUTH TWICE DAILY Patient not taking: Reported on 02/22/2015 05/01/14   Lewayne Bunting, MD   BP 179/93 mmHg  Pulse 82  Temp(Src) 97.5 F (36.4 C) (Oral)  Resp 20  Ht 6' (1.829 m)  Wt 70.308 kg  BMI 21.02 kg/m2  SpO2 95% Physical Exam  Constitutional: He appears well-developed and well-nourished. No distress.  HENT:  Head: Normocephalic and atraumatic.  Eyes: Conjunctivae are normal. Pupils are equal, round, and reactive to light.  Cardiovascular: Normal rate, regular rhythm and normal heart sounds.   Pulmonary/Chest: Effort normal and breath sounds normal. No respiratory distress.  Abdominal: Soft. Bowel sounds are normal.  Musculoskeletal: He exhibits no edema or tenderness.  Neurological: He is alert.  Skin: Skin is warm and dry. He is not diaphoretic.  Nursing note and vitals reviewed.   ED Course  Procedures (including critical care time) Labs Review Labs Reviewed  CBC WITH DIFFERENTIAL/PLATELET - Abnormal; Notable for the following:    Platelets 105 (*)    All other components within normal limits  COMPREHENSIVE METABOLIC PANEL - Abnormal; Notable for the following:    Sodium 129 (*)    Chloride 90 (*)    Calcium 8.5 (*)    AST 189 (*)    ALT 97 (*)    Total Bilirubin 2.4 (*)    All other components within normal limits  URINALYSIS, ROUTINE W REFLEX MICROSCOPIC (NOT AT [facility]) - Abnormal; Notable for the following:    Color, Urine AMBER (*)    Hgb urine dipstick SMALL (*)    Ketones, ur 15 (*)    Protein, ur 30 (*)    All other components within normal limits  URINE MICROSCOPIC-ADD ON - Abnormal; Notable for the following:    Squamous Epithelial / LPF 0-5 (*)    Bacteria, UA RARE (*)    All other components within normal limits  ETHANOL  LIPASE, BLOOD  I-STAT TROPOININ, ED    Imaging Review No results found. I have personally reviewed and evaluated these images and  lab results as part of my medical decision-making.   EKG Interpretation   Date/Time:  Monday February 22 2015 23:55:26 EST Ventricular Rate:  80 PR Interval:  192 QRS Duration: 87 QT Interval:  435 QTC Calculation: 502 R Axis:   58 Text Interpretation:  onsider left atrial enlargement Minimal ST  elevation, inferior leads Prolonged QT interval Sinus rhythm Reconfirmed  by Rubin PayorPICKERING  MD, NATHAN [phone]([id number]) on 02/23/2015 1:23:06 AM      MDM   Final diagnoses:  Nausea  Dehydration  Alcoholic hepatitis without ascites    Samuel DragonJoseph A Heffron presents with nausea for the last 2-3 weeks.  Findings and plan of care discussed with Devoria AlbeIva Knapp, MD.  Patient had a cardiac cath with no obstructions found in 2013. HEART score: 4 (moderate risk). Wells score: 0 No significant abnormalities found on patient's CBC, CMP, and urinalysis shows evidence for dehydration. Patient was reassessed and states that his nausea has greatly improved. Due to his CHF, patient status was reevaluated after he received 250 mL of IV fluid and no changes were found.  It was noted that the patient's ethanol level is less than 5, which gives suspicion for alcohol withdrawal since patient is an alcoholic who drinks daily. Troponin is negative. Also considering alcoholic hepatitis and dehydration for explanations for his nausea.  Patient will need to be admitted for treatment of his dehydration at the very least for gentle rehydration. 2:47 AM Spoke with Dr. Katrinka BlazingSmith, hospitalist, who agreed to admit the patient to a MedSurg floor. No further instructions. Patient was updated on this plan of care including admission, and states he is comfortable with the plan.  Anselm PancoastShawn C Joy, PA-C 02/23/15 0248  Devoria AlbeIva Knapp, MD 02/23/15 [phone]

## 2015-02-23 DIAGNOSIS — E860 Dehydration: Principal | ICD-10-CM

## 2015-02-23 DIAGNOSIS — Z9181 History of falling: Secondary | ICD-10-CM

## 2015-02-23 DIAGNOSIS — Z6822 Body mass index (BMI) 22.0-22.9, adult: Secondary | ICD-10-CM | POA: Diagnosis not present

## 2015-02-23 DIAGNOSIS — I4581 Long QT syndrome: Secondary | ICD-10-CM | POA: Diagnosis present

## 2015-02-23 DIAGNOSIS — Z954 Presence of other heart-valve replacement: Secondary | ICD-10-CM | POA: Diagnosis not present

## 2015-02-23 DIAGNOSIS — I10 Essential (primary) hypertension: Secondary | ICD-10-CM

## 2015-02-23 DIAGNOSIS — Z79899 Other long term (current) drug therapy: Secondary | ICD-10-CM | POA: Diagnosis not present

## 2015-02-23 DIAGNOSIS — F1010 Alcohol abuse, uncomplicated: Secondary | ICD-10-CM

## 2015-02-23 DIAGNOSIS — I110 Hypertensive heart disease with heart failure: Secondary | ICD-10-CM | POA: Diagnosis present

## 2015-02-23 DIAGNOSIS — M419 Scoliosis, unspecified: Secondary | ICD-10-CM | POA: Diagnosis present

## 2015-02-23 DIAGNOSIS — G8929 Other chronic pain: Secondary | ICD-10-CM | POA: Diagnosis present

## 2015-02-23 DIAGNOSIS — M4680 Other specified inflammatory spondylopathies, site unspecified: Secondary | ICD-10-CM | POA: Diagnosis present

## 2015-02-23 DIAGNOSIS — K219 Gastro-esophageal reflux disease without esophagitis: Secondary | ICD-10-CM | POA: Diagnosis present

## 2015-02-23 DIAGNOSIS — E878 Other disorders of electrolyte and fluid balance, not elsewhere classified: Secondary | ICD-10-CM | POA: Diagnosis present

## 2015-02-23 DIAGNOSIS — F10220 Alcohol dependence with intoxication, uncomplicated: Secondary | ICD-10-CM | POA: Diagnosis present

## 2015-02-23 DIAGNOSIS — J449 Chronic obstructive pulmonary disease, unspecified: Secondary | ICD-10-CM | POA: Diagnosis present

## 2015-02-23 DIAGNOSIS — Z952 Presence of prosthetic heart valve: Secondary | ICD-10-CM | POA: Diagnosis not present

## 2015-02-23 DIAGNOSIS — F329 Major depressive disorder, single episode, unspecified: Secondary | ICD-10-CM | POA: Diagnosis present

## 2015-02-23 DIAGNOSIS — I350 Nonrheumatic aortic (valve) stenosis: Secondary | ICD-10-CM | POA: Diagnosis present

## 2015-02-23 DIAGNOSIS — Z87891 Personal history of nicotine dependence: Secondary | ICD-10-CM | POA: Diagnosis not present

## 2015-02-23 DIAGNOSIS — F10230 Alcohol dependence with withdrawal, uncomplicated: Secondary | ICD-10-CM | POA: Diagnosis present

## 2015-02-23 DIAGNOSIS — R9431 Abnormal electrocardiogram [ECG] [EKG]: Secondary | ICD-10-CM

## 2015-02-23 DIAGNOSIS — E871 Hypo-osmolality and hyponatremia: Secondary | ICD-10-CM | POA: Diagnosis present

## 2015-02-23 DIAGNOSIS — M545 Low back pain: Secondary | ICD-10-CM | POA: Diagnosis present

## 2015-02-23 DIAGNOSIS — E43 Unspecified severe protein-calorie malnutrition: Secondary | ICD-10-CM | POA: Diagnosis present

## 2015-02-23 DIAGNOSIS — R112 Nausea with vomiting, unspecified: Secondary | ICD-10-CM | POA: Diagnosis present

## 2015-02-23 DIAGNOSIS — I509 Heart failure, unspecified: Secondary | ICD-10-CM | POA: Diagnosis present

## 2015-02-23 DIAGNOSIS — I714 Abdominal aortic aneurysm, without rupture: Secondary | ICD-10-CM | POA: Diagnosis present

## 2015-02-23 DIAGNOSIS — R740 Nonspecific elevation of levels of transaminase and lactic acid dehydrogenase [LDH]: Secondary | ICD-10-CM | POA: Diagnosis present

## 2015-02-23 DIAGNOSIS — Z7982 Long term (current) use of aspirin: Secondary | ICD-10-CM | POA: Diagnosis not present

## 2015-02-23 LAB — CBC WITH DIFFERENTIAL/PLATELET
Basophils Absolute: 0 K/uL (ref 0.0–0.1)
Basophils Relative: 0 %
Eosinophils Absolute: 0 K/uL (ref 0.0–0.7)
Eosinophils Relative: 0 %
HCT: 39.7 % (ref 39.0–52.0)
Hemoglobin: 14 g/dL (ref 13.0–17.0)
Lymphocytes Relative: 15 %
Lymphs Abs: 0.7 K/uL (ref 0.7–4.0)
MCH: 32.9 pg (ref 26.0–34.0)
MCHC: 35.3 g/dL (ref 30.0–36.0)
MCV: 93.2 fL (ref 78.0–100.0)
Monocytes Absolute: 0.5 K/uL (ref 0.1–1.0)
Monocytes Relative: 11 %
Neutro Abs: 3.2 K/uL (ref 1.7–7.7)
Neutrophils Relative %: 73 %
Platelets: 105 K/uL — ABNORMAL LOW (ref 150–400)
RBC: 4.26 MIL/uL (ref 4.22–5.81)
RDW: 14 % (ref 11.5–15.5)
WBC: 4.4 K/uL (ref 4.0–10.5)

## 2015-02-23 LAB — I-STAT TROPONIN, ED: Troponin i, poc: 0.02 ng/mL (ref 0.00–0.08)

## 2015-02-23 LAB — BASIC METABOLIC PANEL
ANION GAP: 12 (ref 5–15)
BUN: 17 mg/dL (ref 6–20)
CHLORIDE: 93 mmol/L — AB (ref 101–111)
CO2: 28 mmol/L (ref 22–32)
Calcium: 8.4 mg/dL — ABNORMAL LOW (ref 8.9–10.3)
Creatinine, Ser: 0.93 mg/dL (ref 0.61–1.24)
GFR calc non Af Amer: >60 mL/min (ref >60)
GLUCOSE: 115 mg/dL — AB (ref 65–99)
POTASSIUM: 4.9 mmol/L (ref 3.5–5.1)
Sodium: 133 mmol/L — ABNORMAL LOW (ref 135–145)

## 2015-02-23 LAB — CBC
HEMATOCRIT: 40.1 % (ref 39.0–52.0)
HEMOGLOBIN: 13.9 g/dL (ref 13.0–17.0)
MCH: 32.4 pg (ref 26.0–34.0)
MCHC: 34.7 g/dL (ref 30.0–36.0)
MCV: 93.5 fL (ref 78.0–100.0)
Platelets: 84 10*3/uL — ABNORMAL LOW (ref 150–400)
RBC: 4.29 MIL/uL (ref 4.22–5.81)
RDW: 14.2 % (ref 11.5–15.5)
WBC: 3.5 10*3/uL — AB (ref 4.0–10.5)

## 2015-02-23 LAB — COMPREHENSIVE METABOLIC PANEL WITH GFR
ALT: 97 U/L — ABNORMAL HIGH (ref 17–63)
AST: 189 U/L — ABNORMAL HIGH (ref 15–41)
Albumin: 3.6 g/dL (ref 3.5–5.0)
Alkaline Phosphatase: 98 U/L (ref 38–126)
Anion gap: 14 (ref 5–15)
BUN: 15 mg/dL (ref 6–20)
CO2: 25 mmol/L (ref 22–32)
Calcium: 8.5 mg/dL — ABNORMAL LOW (ref 8.9–10.3)
Chloride: 90 mmol/L — ABNORMAL LOW (ref 101–111)
Creatinine, Ser: 0.77 mg/dL (ref 0.61–1.24)
GFR calc Af Amer: >60 mL/min
GFR calc non Af Amer: >60 mL/min
Glucose, Bld: 99 mg/dL (ref 65–99)
Potassium: 4.6 mmol/L (ref 3.5–5.1)
Sodium: 129 mmol/L — ABNORMAL LOW (ref 135–145)
Total Bilirubin: 2.4 mg/dL — ABNORMAL HIGH (ref 0.3–1.2)
Total Protein: 7 g/dL (ref 6.5–8.1)

## 2015-02-23 LAB — URINE MICROSCOPIC-ADD ON

## 2015-02-23 LAB — LIPASE, BLOOD: Lipase: 33 U/L (ref 11–51)

## 2015-02-23 LAB — URINALYSIS, ROUTINE W REFLEX MICROSCOPIC
Bilirubin Urine: NEGATIVE
GLUCOSE, UA: NEGATIVE mg/dL
Ketones, ur: 15 mg/dL — AB
LEUKOCYTES UA: NEGATIVE
Nitrite: NEGATIVE
PH: 5.5 (ref 5.0–8.0)
Protein, ur: 30 mg/dL — AB
Specific Gravity, Urine: 1.013 (ref 1.005–1.030)

## 2015-02-23 LAB — TSH: TSH: 2.33 u[IU]/mL (ref 0.350–4.500)

## 2015-02-23 LAB — TROPONIN I
TROPONIN I: 0.04 ng/mL — AB (ref <0.031)
Troponin I: <0.03 ng/mL (ref <0.031)

## 2015-02-23 LAB — ETHANOL: Alcohol, Ethyl (B): <5 mg/dL

## 2015-02-23 MED ORDER — SELENIUM 50 MCG PO TABS: 50.0000 ug | ORAL_TABLET | Freq: Every day | ORAL | Status: DC

## 2015-02-23 MED ORDER — VITAMIN D 1000 UNITS PO TABS
5000.0000 [IU] | ORAL_TABLET | Freq: Every day | ORAL | Status: DC
  Administered 2015-02-23 – 2015-02-25 (×3): 5000 [IU] via ORAL
  Filled 2015-02-23 (×3): qty 5

## 2015-02-23 MED ORDER — SODIUM CHLORIDE 0.9 % IV SOLN
INTRAVENOUS | Status: DC
  Administered 2015-02-23 – 2015-02-25 (×5): via INTRAVENOUS
  Administered 2015-02-26: 1000 mL via INTRAVENOUS

## 2015-02-23 MED ORDER — SODIUM CHLORIDE 0.9 % IV BOLUS (SEPSIS)
500.0000 mL | Freq: Once | INTRAVENOUS | Status: AC
  Administered 2015-02-23: 500 mL via INTRAVENOUS

## 2015-02-23 MED ORDER — TRAMADOL HCL 50 MG PO TABS
50.0000 mg | ORAL_TABLET | Freq: Two times a day (BID) | ORAL | Status: DC | PRN
Start: 2015-02-23 — End: 2015-02-26
  Administered 2015-02-23: 50 mg via ORAL
  Filled 2015-02-23: qty 1

## 2015-02-23 MED ORDER — THIAMINE HCL 100 MG/ML IJ SOLN
100.0000 mg | Freq: Every day | INTRAMUSCULAR | Status: DC
  Administered 2015-02-23: 100 mg via INTRAVENOUS
  Filled 2015-02-23: qty 1
  Filled 2015-02-23: qty 2

## 2015-02-23 MED ORDER — HYDROMORPHONE HCL 1 MG/ML IJ SOLN: 1.0000 mg | INTRAMUSCULAR | Status: DC | PRN

## 2015-02-23 MED ORDER — GI COCKTAIL ~~LOC~~
30.0000 mL | Freq: Once | ORAL | Status: AC
  Administered 2015-02-23: 30 mL via ORAL
  Filled 2015-02-23: qty 30

## 2015-02-23 MED ORDER — BOOST / RESOURCE BREEZE PO LIQD
1.0000 | Freq: Three times a day (TID) | ORAL | Status: DC
  Administered 2015-02-23 – 2015-02-26 (×6): 1 via ORAL

## 2015-02-23 MED ORDER — FOLIC ACID 1 MG PO TABS
1.0000 mg | ORAL_TABLET | Freq: Every day | ORAL | Status: DC
  Administered 2015-02-23 – 2015-02-26 (×4): 1 mg via ORAL
  Filled 2015-02-23 (×4): qty 1

## 2015-02-23 MED ORDER — ZINC CITRATE-PHYTASE 25-500 MG PO CAPS: 1.0000 | ORAL_CAPSULE | Freq: Every day | ORAL | Status: DC

## 2015-02-23 MED ORDER — LORAZEPAM 2 MG/ML IJ SOLN
1.0000 mg | Freq: Four times a day (QID) | INTRAMUSCULAR | Status: AC | PRN
  Administered 2015-02-23 – 2015-02-25 (×3): 1 mg via INTRAVENOUS
  Filled 2015-02-23 (×3): qty 1

## 2015-02-23 MED ORDER — VITAMIN B-12 100 MCG PO TABS
50.0000 ug | ORAL_TABLET | Freq: Every day | ORAL | Status: DC
  Administered 2015-02-23 – 2015-02-26 (×4): 50 ug via ORAL
  Filled 2015-02-23 (×4): qty 1

## 2015-02-23 MED ORDER — ONDANSETRON HCL 4 MG PO TABS: 4.0000 mg | ORAL_TABLET | Freq: Four times a day (QID) | ORAL | Status: DC | PRN

## 2015-02-23 MED ORDER — ONDANSETRON HCL 4 MG/2ML IJ SOLN: 4.0000 mg | Freq: Three times a day (TID) | INTRAMUSCULAR | Status: DC | PRN

## 2015-02-23 MED ORDER — FUROSEMIDE 20 MG PO TABS
10.0000 mg | ORAL_TABLET | Freq: Every day | ORAL | Status: DC
  Filled 2015-02-23: qty 0.5

## 2015-02-23 MED ORDER — ONDANSETRON HCL 4 MG/2ML IJ SOLN
4.0000 mg | Freq: Four times a day (QID) | INTRAMUSCULAR | Status: DC | PRN
  Administered 2015-02-25 (×2): 4 mg via INTRAVENOUS
  Filled 2015-02-23 (×3): qty 2

## 2015-02-23 MED ORDER — ENSURE ENLIVE PO LIQD
237.0000 mL | Freq: Two times a day (BID) | ORAL | Status: DC
  Administered 2015-02-23: 237 mL via ORAL

## 2015-02-23 MED ORDER — LORAZEPAM 1 MG PO TABS
1.0000 mg | ORAL_TABLET | Freq: Four times a day (QID) | ORAL | Status: AC | PRN
  Administered 2015-02-26: 1 mg via ORAL
  Filled 2015-02-23 (×2): qty 1

## 2015-02-23 MED ORDER — SODIUM CHLORIDE 0.9 % IV SOLN
8.0000 mg | Freq: Once | INTRAVENOUS | Status: AC
  Administered 2015-02-23: 8 mg via INTRAVENOUS
  Filled 2015-02-23: qty 4

## 2015-02-23 MED ORDER — POLYETHYLENE GLYCOL 3350 17 G PO PACK: 17.0000 g | PACK | Freq: Every day | ORAL | Status: DC | PRN

## 2015-02-23 MED ORDER — ADULT MULTIVITAMIN W/MINERALS CH
1.0000 | ORAL_TABLET | Freq: Every day | ORAL | Status: DC
  Administered 2015-02-23 – 2015-02-25 (×3): 1 via ORAL
  Filled 2015-02-23 (×2): qty 1

## 2015-02-23 MED ORDER — ALPRAZOLAM 0.5 MG PO TABS
0.5000 mg | ORAL_TABLET | Freq: Two times a day (BID) | ORAL | Status: DC | PRN
  Administered 2015-02-24 – 2015-02-25 (×2): 0.5 mg via ORAL
  Filled 2015-02-23 (×2): qty 1

## 2015-02-23 MED ORDER — ENOXAPARIN SODIUM 40 MG/0.4ML ~~LOC~~ SOLN
40.0000 mg | SUBCUTANEOUS | Status: DC
  Administered 2015-02-23 – 2015-02-26 (×3): 40 mg via SUBCUTANEOUS
  Filled 2015-02-23 (×4): qty 0.4

## 2015-02-23 MED ORDER — ADULT MULTIVITAMIN W/MINERALS CH
1.0000 | ORAL_TABLET | Freq: Every day | ORAL | Status: DC
  Administered 2015-02-23 – 2015-02-26 (×2): 1 via ORAL
  Filled 2015-02-23 (×4): qty 1

## 2015-02-23 MED ORDER — VITAMIN B-1 100 MG PO TABS
100.0000 mg | ORAL_TABLET | Freq: Every day | ORAL | Status: DC
  Administered 2015-02-24 – 2015-02-26 (×3): 100 mg via ORAL
  Filled 2015-02-23 (×4): qty 1

## 2015-02-23 MED ORDER — METOPROLOL TARTRATE 25 MG PO TABS
12.5000 mg | ORAL_TABLET | Freq: Every day | ORAL | Status: DC
  Administered 2015-02-23 – 2015-02-26 (×4): 12.5 mg via ORAL
  Filled 2015-02-23 (×4): qty 1

## 2015-02-23 NOTE — Progress Notes (Signed)
This RN and CNA attempted to [facility].  Patient did not attempt to bear any weight on legs, patient kept his legs straight and began sliding.  Patient was assisted to floor for safety.  Small skin abrasion to left knee, tegaderm applied.  Dr. Catha GosselinMikhail notified and at bedside.

## 2015-02-23 NOTE — H&P (Addendum)
[facility] History and Physical  Samuel [name] UJW:[id number] DOB: 08/06/1926 DOA: 02/22/2015  Referring physician: ED PCP: Ralene Ok, MD   Chief Complaint: Nausea and Vomiting  HPI:  Samuel [name] is a 81 year old male with a past medical history significant for alcoholism, COPD, aortic valve stenosis s/p aortic valve replacement; who presents with a approximately 2 week history of nausea and vomiting and inability to keep anything down. Patient reports drinking three 40 ounce beers prior to coming into the hospital although initial alcohol level was less than 5. He reports that he is unable to keep any food down and that he is only eaten a bite or 2 of food in the last week. There is also this history of him laying on the floor for some unknown period in time after having a fall. Patient reports inability to completely stop drinking alcohol completely and states needing help. Furthermore, he notes living alone and needing assistance as he has not been able to properly care for himself.  Upon admission into the emergency department is noted to have a low sodium level of 129 and a chloride level of 90.  Review of Systems  Constitutional: Negative for fever and chills.  HENT: Positive for hearing loss. Negative for ear discharge.   Eyes: Positive for blurred vision. Negative for photophobia.  Respiratory: Positive for shortness of breath. Negative for hemoptysis.   Cardiovascular: Negative for chest pain and orthopnea.  Gastrointestinal: Positive for nausea and vomiting.  Genitourinary: Negative for dysuria and frequency.  Musculoskeletal: Positive for myalgias, joint pain and falls.  Skin: Negative for itching and rash.  Neurological: Positive for weakness. Negative for focal weakness and seizures.  Endo/Heme/Allergies: Negative for polydipsia. Bruises/bleeds easily.  Psychiatric/Behavioral: Positive for substance abuse. Negative for depression.       Past Medical History   Diagnosis Date  . Depression   . COPD (chronic obstructive pulmonary disease) (HCC)   . Aortic stenosis     Echo 7/14:  Severe LVH, EF 55-60%, severe AS, mean gradient 78 mmHg, MAC, mild LAE  . Essential hypertension, benign   . Decreased libido   . DIZZINESS, CHRONIC   . Alcohol abuse   . Shortness of breath 10/24/11    "all the time"  . Arthritis     "in the low vertebra"  . Chronic lower back pain   . Scoliosis/kyphoscoliosis 10/28/2011  . Complication of anesthesia     NO NECK LINES ON RIGHT FOR SURGERY  . Heart murmur   . GERD (gastroesophageal reflux disease)     occ  . Abdominal aortic aneurysm (HCC) 10/29/2011    3.7cm AAA by CT scan with unusual appearance suggestive of penetrating atherosclerotic ulcer or localized dissection  . CHF (congestive heart failure) (HCC)   . 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 Perivalvular leak of prosthetic heart valve     echo (02/21/13): Mild LVH, EF 55-60%, aortic valve with mild to moderate perivalvular leak, AI, mean aortic valve gradient 6 mm of mercury, MAC.     Past Surgical History  Procedure Laterality Date  . No past surgeries    . Transcatheter aortic valve replacement, transaortic N/A 01/21/2013    Procedure: TRANSCATHETER AORTIC VALVE REPLACEMENT, TRANSAORTIC;  Surgeon: Purcell Nails, MD;  Location: [facility] OR;  Service: Open Heart Surgery;  Laterality: N/A;  . Intraoperative transesophageal echocardiogram N/A 01/21/2013  Procedure: INTRAOPERATIVE TRANSESOPHAGEAL ECHOCARDIOGRAM;  Surgeon: Purcell Nailslarence H Owen, MD;  [facility];  Service: Open Heart Surgery;  Laterality: N/A;  . Thoracotomy Right 01/21/2013    Procedure: MINI/LIMITED THORACOTOMY;  Surgeon: Purcell Nailslarence H Owen, MD;  [facility];  Service: Open Heart Surgery;  Laterality: Right;  . Left and right heart  catheterization with coronary angiogram N/A 10/27/2011    Procedure: LEFT AND RIGHT HEART CATHETERIZATION WITH CORONARY ANGIOGRAM;  Surgeon: Herby Abrahamhomas D Stuckey, MD;  [facility];  Service: Cardiovascular;  Laterality: N/A;      Social History:  reports that he quit smoking about 62 years ago. His smoking use included Cigarettes. He quit after 2.5 years of use. He has never used smokeless tobacco. He reports that he drinks about 37.8 oz of alcohol per week. He reports that he does not use illicit drugs. Where does patient live--home and with whom if at home? Alone Can patient participate in ADLs? Yes  No Known Allergies  Family History  Problem Relation Age of Onset  . Other      no known medical hx of heart disease  . Heart attack Father [id number]  . Stroke Father   . Heart failure Mother [id number]  . Thyroid disease Mother   . Heart attack Brother 51  . Diabetes Brother   . Heart attack Brother [id number]  . Diabetes Sister   . Hypertension Sister       Prior to Admission medications   Medication Sig Start Date End Date Taking? Authorizing Provider  ALPRAZolam Prudy Feeler(XANAX) 0.5 MG tablet Take 0.5 mg by mouth 2 (two) times daily as needed for anxiety.  10/09/12  Yes Historical Provider, MD  Bioflavonoid Products (ESTER C PO) Take 1,000 mg by mouth daily.   Yes Historical Provider, MD  Cholecalciferol (VITAMIN D-3) 5000 UNITS TABS Take 1 capsule by mouth.   Yes Historical Provider, MD  Cyanocobalamin (VITAMIN B-12 SL) Place 1 tablet under the tongue daily.   Yes Historical Provider, MD  furosemide (LASIX) 20 MG tablet Take 10 mg by mouth daily.   Yes Historical Provider, MD  metoprolol tartrate (LOPRESSOR) 25 MG tablet Take 12.5 mg by mouth daily.   Yes Historical Provider, MD  Multiple Vitamin (MULTIVITAMIN WITH MINERALS) TABS Take 1 tablet by mouth daily. 10/30/11  Yes Penny Piarlando Vega, MD  POTASSIUM GLUCONATE ER PO Take by mouth daily. OTC   Yes Historical Provider, MD  SELENIUM PO Take 1 tablet by mouth  daily.   Yes Historical Provider, MD  traMADol (ULTRAM) 50 MG tablet TAKE 1 TABLET BY MOUTH TWICE DAILY AS NEEDED FOR PAIN 02/25/13  Yes Tiffany L Reed, DO  vitamin B-12 (CYANOCOBALAMIN) 100 MCG tablet Take 50 mcg by mouth daily.     Yes Historical Provider, MD  ZINC CITRATE-PHYTASE PO Take by mouth.   Yes Historical Provider, MD  aspirin EC 325 MG tablet Take 1 tablet (325 mg total) by mouth every other day. Patient not taking: Reported on 02/22/2015 08/17/14   Lewayne BuntingBrian S Crenshaw, MD  metoprolol tartrate (LOPRESSOR) 25 MG tablet TAKE 1/2 TABLET BY MOUTH TWICE DAILY Patient not taking: Reported on 02/22/2015 05/01/14   Lewayne BuntingBrian S Crenshaw, MD     Physical Exam: Filed Vitals:   02/22/15 2234 02/22/15 2248 02/23/15 0041 02/23/15 0203  BP: 179/68 179/68 174/62 179/93  Pulse: 82 82 84 82  Temp: 97.6 F (36.4 C) 97.5 F (36.4 C)    TempSrc: Oral Oral    Resp: 19  19  20  Height:  6' (1.829 m)    Weight:  70.308 kg (155 lb)    SpO2: 95% 95% 94% 95%     Constitutional: Vital signs reviewed. Patient is a well-developed and well-nourished in no acute distress and cooperative with exam. Alert and oriented x3.  Head: Normocephalic and atraumatic  Ear: TM normal bilaterally  Mouth: no erythema or exudates, dry mucous membranes Eyes: PERRL, EOMI, conjunctivae normal, No scleral icterus.  Neck: Supple, Trachea midline normal ROM, No JVD, mass, thyromegaly, or carotid bruit present.  Cardiovascular: RRR, S1 normal, S2 normal, no MRG, pulses symmetric and intact bilaterally  Pulmonary/Chest: CTAB, no wheezes, rales, or rhonchi  Abdominal: Soft. Non-tender, non-distended, bowel sounds are normal, no masses, organomegaly, or guarding present.  GU: no CVA tenderness Musculoskeletal: No joint deformities, erythema, or stiffness, ROM full and no nontender Ext: no edema and no cyanosis, pulses palpable bilaterally (DP and PT)  Hematology: no cervical, inginal, or axillary adenopathy.  Neurological: A&O x3,  Strenght is normal and symmetric bilaterally, cranial nerve II-XII are grossly intact, no focal motor deficit, sensory intact to light touch bilaterally.  Skin: Poor skin turgor, otherwise warm dry and intact Psychiatric: Normal mood and affect. speech and behavior is normal. Judgment and thought content normal. Cognition and memory are normal.      Data Review   Micro Results No results found for this or any previous visit (from the past 240 hour(s)).  Radiology Reports No results found.   CBC  Recent Labs Lab 02/23/15 0001  WBC 4.4  HGB 14.0  HCT 39.7  PLT 105*  MCV 93.2  MCH 32.9  MCHC 35.3  RDW 14.0  LYMPHSABS 0.7  MONOABS 0.5  EOSABS 0.0  BASOSABS 0.0    Chemistries   Recent Labs Lab 02/23/15 0001  NA 129*  K 4.6  CL 90*  CO2 25  GLUCOSE 99  BUN 15  CREATININE 0.77  CALCIUM 8.5*  AST 189*  ALT 97*  ALKPHOS 98  BILITOT 2.4*   ------------------------------------------------------------------------------------------------------------------ estimated creatinine clearance is 65.9 mL/min (by C-G formula based on Cr of 0.77). ------------------------------------------------------------------------------------------------------------------ No results for input(s): HGBA1C in the last 72 hours. ------------------------------------------------------------------------------------------------------------------ No results for input(s): CHOL, HDL, LDLCALC, TRIG, CHOLHDL, LDLDIRECT in the last 72 hours. ------------------------------------------------------------------------------------------------------------------ No results for input(s): TSH, T4TOTAL, T3FREE, THYROIDAB in the last 72 hours.  Invalid input(s): FREET3 ------------------------------------------------------------------------------------------------------------------ No results for input(s): VITAMINB12, FOLATE, FERRITIN, TIBC, IRON, RETICCTPCT in the last 72 hours.  Coagulation profile No results  for input(s): INR, PROTIME in the last 168 hours.  No results for input(s): DDIMER in the last 72 hours.  Cardiac Enzymes No results for input(s): CKMB, TROPONINI, MYOGLOBIN in the last 168 hours.  Invalid input(s): CK ------------------------------------------------------------------------------------------------------------------ Invalid input(s): POCBNP   CBG: No results for input(s): GLUCAP in the last 168 hours.     EKG: Independently reviewed. Sinus rhythm with prolonged QTC of 502 Assessment/Plan     Dehydration: Acute. Patient provided a history of persistent nausea and vomiting with decreased by mouth intake. Initial lab work is consistent with decreased sodium and chloride levels. -IV fluids of normal saline had 167ml/hr  Nausea vomiting: Acute on chronic. -Zofran when necessary nausea symptoms (will need to monitor QTc closely) -Follow-up of any electrolyte abnormalities and replace -Advance diet as tolerated  Alcohol abuse: Chronic. Patient reported drinking three 40 ounce beers prior to coming into the emergency department alcohol level on admission was less than 5% -Social work consult for alcohol abuse issues -  CIAWW protocols  Falls: Acute possibly secondary to alcohol intoxication by patient history -Physical therapy to eval and treat   Essential hypertension, benign -Continue home medications metoprolol -held lasix secondary to suspected dehydration  EKG abnormality with Prolonged QTC: Questionable signs of ST elevation and initial troponin negative at 0.02 -Cardiac enzymes 3 -Recheck EKG in a.m.   S/P TAVR (transcatheter aortic valve replacement): stable   DVT prophylaxis: Lovenox Code Status:   full Family Communication: bedside Disposition Plan: admit   Total time spent 55 minutes.Greater than 50% of this time was spent in counseling, explanation of diagnosis, planning of further management, and coordination of care  Clydie Braun [facility] Pager [phone]  If 7PM-7AM, please contact night-coverage [email or web address] Password [facility] 02/23/2015, 4:40 AM

## 2015-02-23 NOTE — Progress Notes (Signed)
Initial Nutrition Assessment  DOCUMENTATION CODES:   Severe malnutrition in context of chronic illness  INTERVENTION:  Boost Breeze po TID, each supplement provides 250 kcal and 9 grams of protein Provide multivitamin   NUTRITION DIAGNOSIS:   Malnutrition related to nausea, vomiting, other (see comment) (alcohol abuse) as evidenced by severe depletion of muscle mass, severe depletion of body fat.  GOAL:   Patient will meet greater than or equal to 90% of their needs  MONITOR:   I & O's, Skin, Labs, PO intake, Supplement acceptance  REASON FOR ASSESSMENT:   Malnutrition Screening Tool    ASSESSMENT:   [name] is a 81 year old male with a past medical history significant for alcoholism, COPD, aortic valve stenosis s/p aortic valve replacement; who presents with a approximately 2 week history of nausea and vomiting and inability to keep anything down. Patient reports drinking three 40 ounce beers prior to coming into the hospital although initial alcohol level was less than 5. He reports that he is unable to keep any food down and that he is only eaten a bite or 2 of food in the last week  Spoke with pt at bedside. He iterates that nausea and vomiting has been going on for approximately 1 month. In this month timespan he reports he has been so weak that he has not been cooking. Poor appetite, and alcohol abuse have combined to decrease PO intake.  Recent inability to keep anything down has exacerbated this.  Pt says he avoids sugar at home, eats mostly food cooked in coconut oil and olive oil.   11#/7% insignificant wt loss in 6 months. Pt appears severely malnourished with severe fat depletion, severe muscle depletion, no edema.  Admitted to losing weight many years ago.  Pt does not like ensure, agreed to try boost breeze.   Diet Order:  Diet Heart Room service appropriate?: Yes; Fluid consistency:: Thin  Skin:  Reviewed, no issues  Last BM:  02/23/2015  Height:    Ht Readings from Last 1 Encounters:  02/23/15 6' (1.829 m)    Weight:   Wt Readings from Last 1 Encounters:  02/23/15 146 lb 9.7 oz (66.5 kg)    Ideal Body Weight:  81 kg  BMI:  Body mass index is 19.88 kg/(m^2).  Estimated Nutritional Needs:   Kcal:  2000-2300 calories  Protein:  65-80 grams  Fluid:  >/= 2L  EDUCATION NEEDS:   No education needs identified at this time  Samuel AnoWilliam M. Rollen Selders, MS, RD LDN After Hours/Weekend Pager [phone]

## 2015-02-23 NOTE — Progress Notes (Signed)
Patient met earlier this morning by Dr. Fuller Plan.  Agree with current assessment and plan.  81 year old with a past medical history see history of alcoholism, COPD, aortic valve stenosis with replacement who presented to the emergency department with a two-week history of nausea and vomiting. Patient states that he was drinking which helped his nausea and vomiting. He currently lives alone. Continues to feel somewhat nauseous at this time.  General: Well-developed, malnourished elderly male Cardiovascular: RRR, normal S1-S2 Respiratory: Clear auscultation bilaterally  Patient admitted for dehydration with nausea and vomiting. Currently appears to be going through some alcohol withdrawal and on IVF and CIWA protocol.  Has been having frequent falls.  PT and OT consulted.    Total patient care time: 25 minutes  Tadd Holtmeyer D.O. [facility] Pager [phone]  If 7PM-7AM, please contact night-coverage [email or web address] Password [facility] 02/23/2015, 2:01 PM

## 2015-02-23 NOTE — Progress Notes (Signed)
PHARMACIST - PHYSICIAN ORDER COMMUNICATION  CONCERNING: P&T Medication Policy on Herbal Medications  DESCRIPTION:  This patient's order for:  Selenium and Zinc citrate-phytase  has been noted.  This product(s) is classified as an "herbal" or natural product. Due to a lack of definitive safety studies or FDA approval, nonstandard manufacturing practices, plus the potential risk of unknown drug-drug interactions while on inpatient medications, the Pharmacy and Therapeutics Committee does not permit the use of "herbal" or natural products of this type within [facility].   ACTION TAKEN: The pharmacy department is unable to verify this order at this time and your patient has been informed of this safety policy. Please reevaluate patient's clinical condition at discharge and address if the herbal or natural product(s) should be resumed at that time.  Lorenza EvangelistGreen, Reagan Klemz R 02/23/2015 5:02 AM

## 2015-02-23 NOTE — Evaluation (Signed)
Physical Therapy Evaluation Patient Details Name: Samuel [name] MRN: [id number] DOB: 05/03/1926 Today's Date: 02/23/2015   History of Present Illness  81 year old male with a past medical history significant for alcoholism, COPD, aortic valve stenosis s/p aortic valve replacement, CHF, AAA, scoliosis and admitted with nausea and vomiting, dehydration, alcohol abuse.  Clinical Impression  Pt admitted with above diagnosis. Pt currently with functional limitations due to the deficits listed below (see PT Problem List).  Pt will benefit from skilled PT to increase their independence and safety with mobility to allow discharge to the venue listed below.   Pt reports he is from home alone and has basically been in bed for at least a week.  Pt also reports multiple falls at home prior to admission.  Recommend ST-SNF upon d/c.     Follow Up Recommendations SNF;Supervision/Assistance - 24 hour    Equipment Recommendations  None recommended by PT    Recommendations for Other Services       Precautions / Restrictions Precautions Precautions: Fall Restrictions Weight Bearing Restrictions: No      Mobility  Bed Mobility Overal bed mobility: Needs Assistance;+ 2 for safety/equipment Bed Mobility: Supine to Sit;Sit to Supine     Supine to sit: Mod assist Sit to supine: Min assist   General bed mobility comments: verbal cues for self assist, required assist for trunk upright  Transfers Overall transfer level: Needs assistance Equipment used: Rolling walker (2 wheeled) Transfers: Sit to/from Stand Sit to Stand: Mod assist;+2 physical assistance         General transfer comment: performed sit to stands x2 with cues for safe technique, assist to rise and control descent, pt also able to march in place each time he stood and take a couple steps [facility] with min assist for steadying  Ambulation/Gait                Stairs            Wheelchair Mobility    Modified  Rankin (Stroke Patients Only)       Balance                                             Pertinent Vitals/Pain Pain Assessment: No/denies pain    Home Living Family/patient expects to be discharged to:: Private residence Living Arrangements: Alone   Type of Home: Apartment Home Access: Elevator     Home Layout: One level Home Equipment: Environmental consultantWalker - 2 wheels;Walker - 4 wheels;Wheelchair - IT trainermanual;Electric scooter      Prior Function Level of Independence: Independent with assistive device(s)         Comments: was ambulatory with RW, uses scooter for community however has recently been staying in bed     Hand Dominance        Extremity/Trunk Assessment   Upper Extremity Assessment: Generalized weakness           Lower Extremity Assessment: Generalized weakness      Cervical / Trunk Assessment: Other exceptions  Communication   Communication: No difficulties  Cognition Arousal/Alertness: Awake/alert Behavior During Therapy: WFL for tasks assessed/performed Overall Cognitive Status: Within Functional Limits for tasks assessed                      General Comments      Exercises  Assessment/Plan    PT Assessment Patient needs continued PT services  PT Diagnosis Difficulty walking;Generalized weakness   PT Problem List Decreased strength;Decreased activity tolerance;Decreased mobility;Decreased balance;Decreased safety awareness;Decreased knowledge of use of DME  PT Treatment Interventions DME instruction;Gait training;Functional mobility training;Patient/family education;Therapeutic activities;Therapeutic exercise;Balance training   PT Goals (Current goals can be found in the Care Plan section) Acute Rehab PT Goals PT Goal Formulation: With patient Time For Goal Achievement: 03/09/15 Potential to Achieve Goals: Good    Frequency Min 3X/week   Barriers to discharge        Co-evaluation               End  of Session Equipment Utilized During Treatment: Gait belt;Oxygen Activity Tolerance: Patient limited by fatigue Patient left: in bed;with call bell/phone within reach;with bed alarm set           Time: 1110-1130 PT Time Calculation (min) (ACUTE ONLY): 20 min   Charges:   PT Evaluation $Initial PT Evaluation Tier I: 1 Procedure     PT G Codes:        Samuel [name],Samuel [name] 02/23/2015, 12:33 PM Samuel [name], PT, DPT 02/23/2015 Pager: [phone]

## 2015-02-23 NOTE — ED Provider Notes (Signed)
2:28 AM Pt denies any chest pain, abdominal pain or vomiting. He reports associated nausea, chronic SOB and has been unable to eat or drink. .  He states that he has had nausea intermittently for a long time. He states that he has had a decrease appetite with the nausea. Pt reports that he has a crooked spine that pushes on his heart and lungs and that is why he is chronically SOB.   PCP: Dr. Ludwig ClarksMoreira   Physical Exam  Constitutional: He is oriented to person, place, and time.  Elderly frail male  HENT:  Head: Atraumatic.  Right Ear: External ear normal.  Left Ear: External ear normal.  Mucous membranes are dry  Eyes are sunken   Eyes: Conjunctivae and EOM are normal. Pupils are equal, round, and reactive to light.  Eyes are sunken  Pulmonary/Chest: Effort normal. No respiratory distress.  Abdominal: Soft. Bowel sounds are normal. He exhibits no distension and no mass. There is no tenderness. There is no rebound and no guarding.  Neurological: He is alert and oriented to person, place, and time.  Skin: Skin is warm and dry.  Psychiatric: Affect normal.  Nursing note and vitals reviewed.   Medical screening examination/treatment/procedure(s) were conducted as a shared visit with non-physician practitioner(s) and myself.  I personally evaluated the patient during the encounter.   EKG Interpretation   Date/Time:  Monday February 22 2015 23:55:26 EST Ventricular Rate:  80 PR Interval:  192 QRS Duration: 87 QT Interval:  435 QTC Calculation: 502 R Axis:   58 Text Interpretation:  onsider left atrial enlargement Minimal ST  elevation, inferior leads Prolonged QT interval Sinus rhythm Reconfirmed  by Rubin PayorPICKERING  MD, Harrold DonathNATHAN [phone]([id number]) on 02/23/2015 1:23:06 AM       Devoria AlbeIva Felipe Cabell, MD, Concha PyoFACEP   Guhan Bruington, MD 02/23/15 [phone]

## 2015-02-23 NOTE — Progress Notes (Signed)
[place] from [facility] [place]. Alert and oriented x3. Denies pain. No change in the previous assessment by [address] RN. Keot comfortable in bed. Will continue to monitor.

## 2015-02-23 NOTE — Discharge Instructions (Signed)
You have been seen today for nausea. Your lab tests showed no abnormalities. Follow up with PCP as soon as possible on this matter. Return to ED should symptoms worsen.

## 2015-02-23 NOTE — ED Notes (Signed)
MD at bedside. 

## 2015-02-24 DIAGNOSIS — E43 Unspecified severe protein-calorie malnutrition: Secondary | ICD-10-CM | POA: Diagnosis present

## 2015-02-24 MED ORDER — GI COCKTAIL ~~LOC~~
30.0000 mL | Freq: Once | ORAL | Status: AC
  Administered 2015-02-24: 30 mL via ORAL
  Filled 2015-02-24: qty 30

## 2015-02-24 MED ORDER — VITAMINS A & D EX OINT
TOPICAL_OINTMENT | CUTANEOUS | Status: AC
  Administered 2015-02-24: 1
  Filled 2015-02-24: qty 5

## 2015-02-24 NOTE — Clinical Social Work Note (Signed)
Clinical Social Work Assessment  Patient Details  Name: Samuel [name] MRN: [id number] Date of Birth: [birth year]  Date of referral:  02/24/15               Reason for consult:  Discharge Planning                Permission sought to share information with:  Family Supports Permission granted to share information::  Yes, Verbal Permission Granted  Name::     Eulis Manly  Agency::     Relationship::     Contact Information:  daughter  Housing/Transportation Living arrangements for the past 2 months:  Single Family Home Source of Information:  Patient Patient Interpreter Needed:  None Criminal Activity/Legal Involvement Pertinent to Current Situation/Hospitalization:  No - Comment as needed Significant Relationships:  Adult Children, Siblings Lives with:  Self Do you feel safe going back to the place where you live?  No Need for family participation in patient care:  Yes (Comment) (per pt request)  Care giving concerns:  Pt admitted from home alone. PT recommending SNF for rehab.    Social Worker assessment / plan:  CSW received referral for New SNF.   CSW met with pt at bedside. CSW introduced self and explained role. Pt reports that he is from home alone. CSW discussed recommendation for rehab at [facility]. Pt stated that he was agreeable. Pt stated that he did not want to go to the facility at [facility] as he had been there in the past. CSW provide supportive listening as pt discussed that from being sick his home has become messy and his manager is upset with him. CSW inquired with pt if pt daughter could assist with cleaning the home while pt in rehab and pt states that he feels that she could. Pt provided permission to contact pt daughter and pt sister.  CSW completed FL2 and initiated SNF search to [facility] at pt request.   CSW contacted pt daughter and left voicemail on home and cell phone.   CSW to follow up with SNF bed  offers.  CSW to continue to follow.   Employment status:  Retired Forensic scientist:  Medicare PT Recommendations:  [place] / Referral to community resources:  [place]  Patient/Family's Response to care:  Pt alert and oriented x 4. Pt agreeable to rehab at SNF. Pt aware that he cannot drink alcohol in facility and not concerned as he is familiar with going to rehab at SNF as he has been there in the past.   Patient/Family's Understanding of and Emotional Response to Diagnosis, Current Treatment, and Prognosis:  Pt discussed recent symptoms he has been experiencing that brought him into the hospital. Pt recognizing that he is requiring more assistance.   Emotional Assessment Appearance:  Appears stated age, Disheveled Attitude/Demeanor/Rapport:  Other (appropriate) Affect (typically observed):  Appropriate Orientation:  Oriented to Self, Oriented to Place, Oriented to  Time, Oriented to Situation Alcohol / Substance use:  Not Applicable Psych involvement (Current and /or in the community):  No (Comment)  Discharge Needs  Concerns to be addressed:  Discharge Planning Concerns Readmission within the last 30 days:  No Current discharge risk:  Physical Impairment, Lives alone Barriers to Discharge:  Continued Medical Work up   Ladell Pier, LCSW 02/24/2015, 11:22 AM  [phone]

## 2015-02-24 NOTE — Clinical Social Work Placement (Signed)
   CLINICAL SOCIAL WORK PLACEMENT  NOTE  Date:  02/24/2015  Patient Details  Name: Samuel [name] MRN: [id number] Date of Birth: 1926/06/30  Clinical Social Work is seeking post-discharge placement for this patient at the Skilled  Nursing Facility level of care (*CSW will initial, date and re-position this form in  chart as items are completed):  Yes   Patient/family provided with [facility] Clinical Social Work Department's list of facilities offering this level of care within the geographic area requested by the patient (or if unable, by the patient's family).  Yes   Patient/family informed of their freedom to choose among providers that offer the needed level of care, that participate in Medicare, Medicaid or managed care program needed by the patient, have an available bed and are willing to accept the patient.  Yes   Patient/family informed of [facility] ownership interest in [facility], as well as of the fact that they are under no obligation to receive care at these facilities.  PASRR submitted to EDS on       PASRR number received on       Existing PASRR number confirmed on 02/24/15     FL2 transmitted to all facilities in geographic area requested by pt/family on 02/24/15     FL2 transmitted to all facilities within larger geographic area on       Patient informed that his/her managed care company has contracts with or will negotiate with certain facilities, including the following:            Patient/family informed of bed offers received.  Patient chooses bed at       Physician recommends and patient chooses bed at      Patient to be transferred to   on  .  Patient to be transferred to facility by       Patient family notified on   of transfer.  Name of family member notified:        PHYSICIAN Please sign FL2     Additional Comment:    _______________________________________________ Orson EvaKIDD, Nalea Salce A, LCSW 02/24/2015, 11:19  AM

## 2015-02-24 NOTE — NC FL2 (Signed)
[place] MEDICAID FL2 LEVEL OF CARE SCREENING TOOL     IDENTIFICATION  Patient Name: Samuel [name] Birthdate: 1927/01/17 Sex: male Admission Date (Current Location): 02/22/2015  [facility] [place] Number: Dispensing optician and Address:  [facility],  [address], [place] [address]      Provider Number: [id number]  Attending Physician Name and Address:  Clydia Llano, MD  Relative Name and Phone Number:       Current Level of Care: Hospital Recommended Level of Care: Skilled Nursing Facility Prior Approval Number:    Date Approved/Denied:   PASRR Number: [id number]  Discharge Plan: SNF    Current Diagnoses: Patient Active Problem List   Diagnosis Date Noted  . Dehydration 02/23/2015  . H/O aortic valve replacement 04/14/2013  . Osteoarthritis of right hip 01/29/2013  . 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 Aortic stenosis, severe 11/20/2011  . Abdominal aortic aneurysm (HCC) 10/29/2011  . Scoliosis/kyphoscoliosis 10/28/2011  . Alcohol intoxication (HCC) 10/24/2011  . Physical deconditioning 10/24/2011  . Generalized weakness 10/24/2011  . Altered mental status 10/24/2011  . Fever and chills 10/24/2011  . Dysphagia 10/24/2011  . Essential hypertension, benign 03/04/2010  . AORTIC STENOSIS 09/03/2008  . COPD 09/03/2008  . DEPRESSION 07/19/2007  . CELLULITIS AND ABSCESS OF FOOT EXCEPT TOES 07/19/2007  . DIZZINESS, CHRONIC 07/11/2007  . DECREASED LIBIDO 07/11/2007  . Alcohol abuse 07/08/2007  . HEADACHE 07/08/2007  . HEART MURMUR 07/11/2006  . DYSPNEA ON EXERTION 07/11/2006  . RLQ PAIN 07/11/2006    Orientation ACTIVITIES/SOCIAL BLADDER RESPIRATION    Self, Time, Situation, Place  Active External catheter O2 (As needed) (2 L/min)  BEHAVIORAL SYMPTOMS/MOOD NEUROLOGICAL BOWEL NUTRITION STATUS      Incontinent Diet (Heart Room service appropriate; Fluid consistency: Thin)  PHYSICIAN VISITS COMMUNICATION OF  NEEDS Height & Weight Skin    Verbally 6' (182.9 cm) 146 lbs. Skin abrasions          AMBULATORY STATUS RESPIRATION    Assist extensive O2 (As needed) (2 L/min)      Personal Care Assistance Level of Assistance  Bathing, Feeding, Dressing Bathing Assistance: Limited assistance Feeding assistance: Independent Dressing Assistance: Limited assistance      Functional Limitations Info  Sight, Hearing, Speech Sight Info: Impaired Hearing Info: Adequate Speech Info: Adequate       SPECIAL CARE FACTORS FREQUENCY  PT (By licensed PT), OT (By licensed OT)     PT Frequency: 5x week OT Frequency: 5x week           Additional Factors Info  Code Status, Allergies, Psychotropic Code Status Info: FULL Code Allergies Info: No Known Allergies  Psychotropic Info: Ativan         Current Medications (02/24/2015): Current Facility-Administered Medications  Medication Dose Route Frequency Provider Last Rate Last Dose  . 0.9 %  sodium chloride infusion   Intravenous Continuous Clydie Braun, MD 125 mL/hr at 02/24/15 0622    . ALPRAZolam Prudy Feeler) tablet 0.5 mg  0.5 mg Oral BID PRN Clydie Braun, MD   0.5 mg at 02/24/15 0944  . cholecalciferol (VITAMIN D) tablet 5,000 Units  5,000 Units Oral Daily Clydie Braun, MD   5,000 Units at 02/24/15 0941  . enoxaparin (LOVENOX) injection 40 mg  40 mg Subcutaneous Q24H Clydie Braun, MD   40 mg at 02/24/15 0945  . feeding supplement (BOOST / RESOURCE BREEZE) liquid 1 Container  1 Container Oral TID BM Dionne Ano  Ward, RD   1 Container at 02/24/15 1000  . folic acid (FOLVITE) tablet 1 mg  1 mg Oral Daily Clydie Braun, MD   1 mg at 02/24/15 0944  . LORazepam (ATIVAN) tablet 1 mg  1 mg Oral Q6H PRN Clydie Braun, MD       Or  . LORazepam (ATIVAN) injection 1 mg  1 mg Intravenous Q6H PRN Clydie Braun, MD   1 mg at 02/23/15 1304  . metoprolol tartrate (LOPRESSOR) tablet 12.5 mg  12.5 mg Oral Daily Clydie Braun, MD   12.5 mg at  02/24/15 0942  . multivitamin with minerals tablet 1 tablet  1 tablet Oral Daily Clydie Braun, MD   1 tablet at 02/23/15 1120  . multivitamin with minerals tablet 1 tablet  1 tablet Oral Daily Clydie Braun, MD   1 tablet at 02/24/15 0943  . ondansetron (ZOFRAN) tablet 4 mg  4 mg Oral Q6H PRN Clydie Braun, MD       Or  . ondansetron (ZOFRAN) injection 4 mg  4 mg Intravenous Q6H PRN Rondell A Katrinka Blazing, MD      . polyethylene glycol (MIRALAX / GLYCOLAX) packet 17 g  17 g Oral Daily PRN Clydie Braun, MD      . thiamine (VITAMIN B-1) tablet 100 mg  100 mg Oral Daily Rondell A Katrinka Blazing, MD   100 mg at 02/24/15 0945   Or  . thiamine (B-1) injection 100 mg  100 mg Intravenous Daily Clydie Braun, MD   100 mg at 02/23/15 1120  . traMADol (ULTRAM) tablet 50 mg  50 mg Oral Q12H PRN Clydie Braun, MD   50 mg at 02/23/15 2156  . vitamin B-12 (CYANOCOBALAMIN) tablet 50 mcg  50 mcg Oral Daily Clydie Braun, MD   50 mcg at 02/24/15 0945   Facility-Administered Medications Ordered in Other Encounters  Medication Dose Route Frequency Provider Last Rate Last Dose  . 0.9 %  sodium chloride infusion  1 mL/kg/hr Intravenous Continuous Lewayne Bunting, MD       Do not use this list as official medication orders. Please verify with discharge summary.  Discharge Medications:   Medication List    ASK your doctor about these medications        ALPRAZolam 0.5 MG tablet  Commonly known as:  XANAX  Take 0.5 mg by mouth 2 (two) times daily as needed for anxiety.     aspirin EC 325 MG tablet  Take 1 tablet (325 mg total) by mouth every other day.     ESTER C PO  Take 1,000 mg by mouth daily.     furosemide 20 MG tablet  Commonly known as:  LASIX  Take 10 mg by mouth daily.     metoprolol tartrate 25 MG tablet  Commonly known as:  LOPRESSOR  Take 12.5 mg by mouth daily.     metoprolol tartrate 25 MG tablet  Commonly known as:  LOPRESSOR  TAKE 1/2 TABLET BY MOUTH TWICE DAILY      multivitamin with minerals Tabs tablet  Take 1 tablet by mouth daily.     POTASSIUM GLUCONATE ER PO  Take by mouth daily. OTC     SELENIUM PO  Take 1 tablet by mouth daily.     traMADol 50 MG tablet  Commonly known as:  ULTRAM  TAKE 1 TABLET BY MOUTH TWICE DAILY AS NEEDED FOR PAIN     vitamin B-12  100 MCG tablet  Commonly known as:  CYANOCOBALAMIN  Take 50 mcg by mouth daily.     VITAMIN B-12 SL  Place 1 tablet under the tongue daily.     Vitamin D-3 5000 UNITS Tabs  Take 1 capsule by mouth.     ZINC CITRATE-PHYTASE PO  Take by mouth.        Relevant Imaging Results:  Relevant Lab Results:  Recent Labs    Additional Information SSN: [id number]  Chasitie Passey A, LCSW

## 2015-02-24 NOTE — Clinical Documentation Improvement (Signed)
Internal Medicine  Can the diagnosis of Malnutrition be further specified?   Document Severity - Severe(third degree), Moderate (second degree), Mild (first degree)  Other condition  Unable to clinically determine  Document any associated diagnoses/conditions   Supporting Information: Registered Dietitician note 02/23/2015 states severe malnutrition.   Please exercise your independent, professional judgment when responding. A specific answer is not anticipated or expected.Please update your documentation within the medical record to reflect your response to this query.  Thank you, Doy MinceVangela Layni Kreamer, RN [phone] Clinical Documentation Specialist

## 2015-02-24 NOTE — Progress Notes (Signed)
[facility] PROGRESS NOTE   RENWICK ASMAN ZOX:[id number] DOB: [birth year] DOA: 02/22/2015 PCP: Ralene Ok, MD  HPI/Subjective: Seen while he was eating his breakfast, denies any complains. Minimal nausea.  Assessment/Plan: Principal Problem:   Dehydration Active Problems:   Alcohol abuse   Essential hypertension, benign   S/P TAVR (transcatheter aortic valve replacement)   Dehydration:  Acute. Patient provided a history of persistent nausea and vomiting with decreased by mouth intake.  Presented with hyponatremia/hypochloremia consistent with hydration. This is improved after IV fluid hydration, continue normal saline.  Nausea vomiting: Acute on chronic. -Zofran when necessary nausea symptoms (will need to monitor QTc closely) -Follow-up of any electrolyte abnormalities and replace -Advance diet as tolerated  Alcohol abuse: . Chronic. Patient reported drinking three 40 ounce beers prior to coming into the ED alcohol level on admission was <5 mg/dL -Social work consult for alcohol abuse issues -CIWA protocols  Falls:  Acute possibly secondary to alcohol intoxication by patient history Seen by PT and recommended a SNF, CSW to help with placement.  Essential hypertension, benign -Continue home medications metoprolol -held lasix secondary to suspected dehydration  EKG abnormality with Prolonged QTC:  Questionable signs of ST elevation and initial troponin negative at 0.02 -Cardiac enzymes 3 -Patient denies any chest pain,   S/P TAVR (transcatheter aortic valve replacement): stable  Code Status: Full Code Family Communication: Plan discussed with the patient. Disposition Plan: Remains inpatient Diet: Diet Heart Room service appropriate?: Yes; Fluid consistency:: Thin  Consultants:  None  Procedures:  None  Antibiotics:  None   Objective: Filed Vitals:   02/24/15 0644 02/24/15 1122  BP: 167/53 146/46  Pulse: 77 71  Temp: 98.6 F (37 C)  97.9 F (36.6 C)  Resp: 20 18    Intake/Output Summary (Last 24 hours) at 02/24/15 1147 Last data filed at 02/24/15 1106  Gross per 24 hour  Intake    480 ml  Output    400 ml  Net     80 ml   Filed Weights   02/23/15 0443 02/23/15 0500 02/24/15 0644  Weight: 66.9 kg (147 lb 7.8 oz) 66.5 kg (146 lb 9.7 oz) 73.8 kg (162 lb 11.2 oz)    Exam: General: Alert and awake, oriented x3, not in any acute distress. HEENT: anicteric sclera, pupils reactive to light and accommodation, EOMI CVS: S1-S2 clear, no murmur rubs or gallops Chest: clear to auscultation bilaterally, no wheezing, rales or rhonchi Abdomen: soft nontender, nondistended, normal bowel sounds, no organomegaly Extremities: no cyanosis, clubbing or edema noted bilaterally Neuro: Cranial nerves II-XII intact, no focal neurological deficits  Data Reviewed: Basic Metabolic Panel:  Recent Labs Lab 02/23/15 0001 02/23/15 0635  NA 129* 133*  K 4.6 4.9  CL 90* 93*  CO2 25 28  GLUCOSE 99 115*  BUN 15 17  CREATININE 0.77 0.93  CALCIUM 8.5* 8.4*   Liver Function Tests:  Recent Labs Lab 02/23/15 0001  AST 189*  ALT 97*  ALKPHOS 98  BILITOT 2.4*  PROT 7.0  ALBUMIN 3.6    Recent Labs Lab 02/23/15 0001  LIPASE 33   No results for input(s): AMMONIA in the last 168 hours. CBC:  Recent Labs Lab 02/23/15 0001 02/23/15 0639  WBC 4.4 3.5*  NEUTROABS 3.2  --   HGB 14.0 13.9  HCT 39.7 40.1  MCV 93.2 93.5  PLT 105* 84*   Cardiac Enzymes:  Recent Labs Lab 02/23/15 0635 02/23/15 1325 02/23/15 1945  TROPONINI <0.03 <0.03 0.04*   BNP (  last 3 results) No results for input(s): BNP in the last 8760 hours.  ProBNP (last 3 results) No results for input(s): PROBNP in the last 8760 hours.  CBG: No results for input(s): GLUCAP in the last 168 hours.  Micro No results found for this or any previous visit (from the past 240 hour(s)).   Studies: No results found.  Scheduled Meds: . cholecalciferol   5,000 Units Oral Daily  . enoxaparin (LOVENOX) injection  40 mg Subcutaneous Q24H  . feeding supplement  1 Container Oral TID BM  . folic acid  1 mg Oral Daily  . metoprolol tartrate  12.5 mg Oral Daily  . multivitamin with minerals  1 tablet Oral Daily  . multivitamin with minerals  1 tablet Oral Daily  . thiamine  100 mg Oral Daily   Or  . thiamine  100 mg Intravenous Daily  . vitamin B-12  50 mcg Oral Daily   Continuous Infusions: . sodium chloride 125 mL/hr at 02/24/15 0622       Time spent: 35 minutes    [facility]MA[facility],Shilo Philipson A  [facility] Pager [phone] If 7PM-7AM, please contact night-coverage at [email or web address], password [email or web address] 02/24/2015, 11:47 AM  LOS: 1 day

## 2015-02-25 DIAGNOSIS — E43 Unspecified severe protein-calorie malnutrition: Secondary | ICD-10-CM

## 2015-02-25 LAB — COMPREHENSIVE METABOLIC PANEL
ALK PHOS: 84 U/L (ref 38–126)
ALT: 92 U/L — AB (ref 17–63)
ANION GAP: 6 (ref 5–15)
AST: 154 U/L — ABNORMAL HIGH (ref 15–41)
Albumin: 3.1 g/dL — ABNORMAL LOW (ref 3.5–5.0)
BILIRUBIN TOTAL: 1.1 mg/dL (ref 0.3–1.2)
BUN: 13 mg/dL (ref 6–20)
CALCIUM: 7.9 mg/dL — AB (ref 8.9–10.3)
CO2: 30 mmol/L (ref 22–32)
CREATININE: 0.81 mg/dL (ref 0.61–1.24)
Chloride: 97 mmol/L — ABNORMAL LOW (ref 101–111)
Glucose, Bld: 147 mg/dL — ABNORMAL HIGH (ref 65–99)
Potassium: 4.4 mmol/L (ref 3.5–5.1)
Sodium: 133 mmol/L — ABNORMAL LOW (ref 135–145)
TOTAL PROTEIN: 6.1 g/dL — AB (ref 6.5–8.1)

## 2015-02-25 NOTE — Progress Notes (Addendum)
[facility] PROGRESS NOTE   Samuel [name]:[id number] DOB: [birth year] DOA: 02/22/2015 PCP: Ralene OkMOREIRA,ROY, MD  HPI/Subjective: Denies any complaints, denies any nausea or vomiting. Complaining about the food is not enough for him. I will add ensure. For SNF in a.m.  Assessment/Plan: Principal Problem:   Dehydration Active Problems:   Alcohol abuse   Essential hypertension, benign   S/P TAVR (transcatheter aortic valve replacement)   Protein-calorie malnutrition, severe (HCC)   Dehydration:  Acute. Patient provided a history of persistent nausea and vomiting with decreased by mouth intake.  Presented with hyponatremia/hypochloremia consistent with hydration. This is improved after IV fluid hydration, continue normal saline.  Nausea vomiting: Acute on chronic. -Zofran when necessary nausea symptoms (will need to monitor QTc closely) -Follow-up of any electrolyte abnormalities and replace -Advance diet as tolerated  Alcohol abuse: . Chronic. Patient reported drinking three 40 ounce beers prior to coming into the ED alcohol level on admission was <5 mg/dL -Social work consult for alcohol abuse issues -CIWA protocols  Falls:  Acute possibly secondary to alcohol intoxication by patient history Seen by PT and recommended a SNF, CSW to help with placement.  Essential hypertension, benign -Continue home medications metoprolol -held lasix secondary to suspected dehydration  EKG abnormality with Prolonged QTC:  Questionable signs of ST elevation and initial troponin negative at 0.02 -Cardiac enzymes 3  -Patient denies any chest pain,  Transaminitis -With AST:ALT ratio is 2:1 consistent with alcohol as the cause  S/P TAVR (transcatheter aortic valve replacement): stable  Severe protein energy malnutrition Night dictation seeing, recommended multivitamin and boost breeze 3 times a day.  Code Status: Full Code Family Communication: Plan discussed with the  patient. Disposition Plan: Remains inpatient Diet: Diet Heart Room service appropriate?: Yes; Fluid consistency:: Thin  Consultants:  None  Procedures:  None  Antibiotics:  None   Objective: Filed Vitals:   02/24/15 2047 02/25/15 0530  BP: 154/48 168/52  Pulse: 68 66  Temp: 97.5 F (36.4 C) 97.5 F (36.4 C)  Resp: 18 18    Intake/Output Summary (Last 24 hours) at 02/25/15 1145 Last data filed at 02/25/15 1003  Gross per 24 hour  Intake    600 ml  Output   1750 ml  Net  -1150 ml   Filed Weights   02/23/15 0443 02/23/15 0500 02/24/15 0644  Weight: 66.9 kg (147 lb 7.8 oz) 66.5 kg (146 lb 9.7 oz) 73.8 kg (162 lb 11.2 oz)    Exam: General: Alert and awake, oriented x3, not in any acute distress. HEENT: anicteric sclera, pupils reactive to light and accommodation, EOMI CVS: S1-S2 clear, no murmur rubs or gallops Chest: clear to auscultation bilaterally, no wheezing, rales or rhonchi Abdomen: soft nontender, nondistended, normal bowel sounds, no organomegaly Extremities: no cyanosis, clubbing or edema noted bilaterally Neuro: Cranial nerves II-XII intact, no focal neurological deficits  Data Reviewed: Basic Metabolic Panel:  Recent Labs Lab 02/23/15 0001 02/23/15 0635 02/25/15 0405  NA 129* 133* 133*  K 4.6 4.9 4.4  CL 90* 93* 97*  CO2 25 28 30   GLUCOSE 99 115* 147*  BUN 15 17 13   CREATININE 0.77 0.93 0.81  CALCIUM 8.5* 8.4* 7.9*   Liver Function Tests:  Recent Labs Lab 02/23/15 0001 02/25/15 0405  AST 189* 154*  ALT 97* 92*  ALKPHOS 98 84  BILITOT 2.4* 1.1  PROT 7.0 6.1*  ALBUMIN 3.6 3.1*    Recent Labs Lab 02/23/15 0001  LIPASE 33   No results for input(s):  AMMONIA in the last 168 hours. CBC:  Recent Labs Lab 02/23/15 0001 02/23/15 0639  WBC 4.4 3.5*  NEUTROABS 3.2  --   HGB 14.0 13.9  HCT 39.7 40.1  MCV 93.2 93.5  PLT 105* 84*   Cardiac Enzymes:  Recent Labs Lab 02/23/15 0635 02/23/15 1325 02/23/15 1945  TROPONINI  <0.03 <0.03 0.04*   BNP (last 3 results) No results for input(s): BNP in the last 8760 hours.  ProBNP (last 3 results) No results for input(s): PROBNP in the last 8760 hours.  CBG: No results for input(s): GLUCAP in the last 168 hours.  Micro No results found for this or any previous visit (from the past 240 hour(s)).   Studies: No results found.  Scheduled Meds: . cholecalciferol  5,000 Units Oral Daily  . enoxaparin (LOVENOX) injection  40 mg Subcutaneous Q24H  . feeding supplement  1 Container Oral TID BM  . folic acid  1 mg Oral Daily  . metoprolol tartrate  12.5 mg Oral Daily  . multivitamin with minerals  1 tablet Oral Daily  . multivitamin with minerals  1 tablet Oral Daily  . thiamine  100 mg Oral Daily   Or  . thiamine  100 mg Intravenous Daily  . vitamin B-12  50 mcg Oral Daily   Continuous Infusions: . sodium chloride 75 mL/hr at 02/25/15 1610       Time spent: 35 minutes    [facility] A  [facility] Pager [phone] If 7PM-7AM, please contact night-coverage at [email or web address], password [facility] 02/25/2015, 11:45 AM  LOS: 2 days

## 2015-02-26 LAB — RENAL FUNCTION PANEL
ALBUMIN: 3 g/dL — AB (ref 3.5–5.0)
ANION GAP: 7 (ref 5–15)
BUN: 13 mg/dL (ref 6–20)
CALCIUM: 8.7 mg/dL — AB (ref 8.9–10.3)
CO2: 32 mmol/L (ref 22–32)
Chloride: 99 mmol/L — ABNORMAL LOW (ref 101–111)
Creatinine, Ser: 0.83 mg/dL (ref 0.61–1.24)
GFR calc Af Amer: >60 mL/min (ref >60)
GLUCOSE: 127 mg/dL — AB (ref 65–99)
PHOSPHORUS: 2.7 mg/dL (ref 2.5–4.6)
Potassium: 4.3 mmol/L (ref 3.5–5.1)
SODIUM: 138 mmol/L (ref 135–145)

## 2015-02-26 MED ORDER — ALPRAZOLAM 0.5 MG PO TABS: 0.5000 mg | ORAL_TABLET | Freq: Two times a day (BID) | ORAL | Status: DC | PRN

## 2015-02-26 MED ORDER — ONDANSETRON HCL 4 MG PO TABS: 4.0000 mg | ORAL_TABLET | Freq: Four times a day (QID) | ORAL | Status: DC | PRN

## 2015-02-26 MED ORDER — TRAMADOL HCL 50 MG PO TABS: 50.0000 mg | ORAL_TABLET | Freq: Two times a day (BID) | ORAL | Status: DC | PRN

## 2015-02-26 NOTE — Discharge Summary (Signed)
Physician Discharge Summary  Samuel [name] VWU:[id number] DOB: 10-01-1926 DOA: 02/22/2015  PCP: Ralene Ok, MD  Admit date: 02/22/2015 Discharge date: 02/26/2015  Time spent: 40 minutes  Recommendations for Outpatient Follow-up:  1. Follow up with nursing home M.D., discharging to [facility] nursing home.   Discharge Diagnoses:  Principal Problem:   Dehydration Active Problems:   Alcohol abuse   Essential hypertension, benign   S/P TAVR (transcatheter aortic valve replacement)   Protein-calorie malnutrition, severe (HCC)   Discharge Condition: Stable   Diet recommendation: Regular diet  Filed Weights   02/23/15 0443 02/23/15 0500 02/24/15 0644  Weight: 66.9 kg (147 lb 7.8 oz) 66.5 kg (146 lb 9.7 oz) 73.8 kg (162 lb 11.2 oz)    History of present illness:  Samuel [name] is a 81 year old male with a past medical history significant for alcoholism, COPD, aortic valve stenosis s/p aortic valve replacement; who presents with a approximately 2 week history of nausea and vomiting and inability to keep anything down. Patient reports drinking three 40 ounce beers prior to coming into the hospital although initial alcohol level was less than 5. He reports that he is unable to keep any food down and that he is only eaten a bite or 2 of food in the last week. There is also this history of him laying on the floor for some unknown period in time after having a fall. Patient reports inability to completely stop drinking alcohol completely and states needing help. Furthermore, he notes living alone and needing assistance as he has not been able to properly care for himself.  Upon admission into the emergency department is noted to have a low sodium level of 129 and a chloride level of 90.  Hospital Course:   Dehydration:  Acute. Patient provided a history of persistent nausea and vomiting with decreased by mouth intake.  Presented with hyponatremia/hypochloremia consistent with  hydration. This is resolved after IV fluid hydration.  Nausea vomiting: Acute on chronic. -Has chronic nausea, probably exacerbated by recent alcohol intake. -Follow-up of any electrolyte abnormalities and replace -Diet advanced to regular, tolerated very well. Still complaining about nausea every now and then.  Alcohol abuse: . Chronic. Patient reported drinking three 40 ounce beers prior to coming into the ED alcohol level on admission was <5 mg/dL -Social work consult for alcohol abuse issues -CIWA protocol started, patient did not have significant withdrawal symptoms.  Falls:  -Acute possibly secondary to alcohol intoxication by patient history -Seen by PT and recommended a SNF.  Essential hypertension, benign -Continue home medications metoprolol -held lasix secondary to suspected dehydration  EKG abnormality with Prolonged QTC:  Questionable signs of ST elevation and initial troponin negative at 0.02 -Cardiac enzymes 3 negative except one set slightly positive at 0.04(normal is <0.031) -Patient has no chest pain, denies any recent cardiac symptoms, no further workup.  Transaminitis -AST 189 and ALT 97, AST:ALT ratio is 2:1 consistent with alcohol as the cause  S/P TAVR (transcatheter aortic valve replacement): stable  Severe protein energy malnutrition Night dictation seeing, recommended multivitamin and boost breeze 3 times a day.   Procedures:  None  Consultations:  None  Discharge Exam: Filed Vitals:   02/25/15 2023 02/26/15 0452  BP: 168/55 173/60  Pulse: 63 71  Temp: 97.6 F (36.4 C) 97.8 F (36.6 C)  Resp: 17 16   General: Alert and awake, oriented x3, not in any acute distress. HEENT: anicteric sclera, pupils reactive to light and accommodation, EOMI CVS: S1-S2 clear, no murmur  rubs or gallops Chest: clear to auscultation bilaterally, no wheezing, rales or rhonchi Abdomen: soft nontender, nondistended, normal bowel sounds, no  organomegaly Extremities: no cyanosis, clubbing or edema noted bilaterally Neuro: Cranial nerves II-XII intact, no focal neurological deficits   Discharge Instructions   Discharge Instructions    Diet - low sodium heart healthy    Complete by:  As directed      Increase activity slowly    Complete by:  As directed           Current Discharge Medication List    START taking these medications   Details  ondansetron (ZOFRAN) 4 MG tablet Take 1 tablet (4 mg total) by mouth every 6 (six) hours as needed for nausea. Qty: 20 tablet, Refills: 0      CONTINUE these medications which have CHANGED   Details  ALPRAZolam (XANAX) 0.5 MG tablet Take 1 tablet (0.5 mg total) by mouth 2 (two) times daily as needed for anxiety. Qty: 10 tablet, Refills: 0    traMADol (ULTRAM) 50 MG tablet Take 1 tablet (50 mg total) by mouth 2 (two) times daily as needed for moderate pain. Qty: 10 tablet, Refills: 0      CONTINUE these medications which have NOT CHANGED   Details  Bioflavonoid Products (ESTER C PO) Take 1,000 mg by mouth daily.    Cholecalciferol (VITAMIN D-3) 5000 UNITS TABS Take 1 capsule by mouth.    Cyanocobalamin (VITAMIN B-12 SL) Place 1 tablet under the tongue daily.    furosemide (LASIX) 20 MG tablet Take 10 mg by mouth daily.    metoprolol tartrate (LOPRESSOR) 25 MG tablet Take 12.5 mg by mouth daily.    Multiple Vitamin (MULTIVITAMIN WITH MINERALS) TABS Take 1 tablet by mouth daily. Qty: 30 tablet, Refills: 0    POTASSIUM GLUCONATE ER PO Take by mouth daily. OTC    SELENIUM PO Take 1 tablet by mouth daily.    vitamin B-12 (CYANOCOBALAMIN) 100 MCG tablet Take 50 mcg by mouth daily.      ZINC CITRATE-PHYTASE PO Take by mouth.    aspirin EC 325 MG tablet Take 1 tablet (325 mg total) by mouth every other day. Qty: 30 tablet, Refills: 0       No Known Allergies Follow-up Information    Follow up with MOREIRA,ROY, MD In 1 week.   Specialty:  Internal Medicine    Contact information:   [address] [place] [address] [phone]        The results of significant diagnostics from this hospitalization (including imaging, microbiology, ancillary and laboratory) are listed below for reference.    Significant Diagnostic Studies: No results found.  Microbiology: No results found for this or any previous visit (from the past 240 hour(s)).   Labs: Basic Metabolic Panel:  Recent Labs Lab 02/23/15 0001 02/23/15 0635 02/25/15 0405 02/26/15 0415  NA 129* 133* 133* 138  K 4.6 4.9 4.4 4.3  CL 90* 93* 97* 99*  CO2 32  GLUCOSE 99 115* 147* 127*  BUN CREATININE 0.77 0.93 0.81 0.83  CALCIUM 8.5* 8.4* 7.9* 8.7*  PHOS  --   --   --  2.7   Liver Function Tests:  Recent Labs Lab 02/23/15 0001 02/25/15 0405 02/26/15 0415  AST 189* 154*  --   ALT 97* 92*  --   ALKPHOS 98 84  --   BILITOT 2.4* 1.1  --   PROT 7.0 6.1*  --  ALBUMIN 3.6 3.1* 3.0*    Recent Labs Lab 02/23/15 0001  LIPASE 33   No results for input(s): AMMONIA in the last 168 hours. CBC:  Recent Labs Lab 02/23/15 0001 02/23/15 0639  WBC 4.4 3.5*  NEUTROABS 3.2  --   HGB 14.0 13.9  HCT 39.7 40.1  MCV 93.2 93.5  PLT 105* 84*   Cardiac Enzymes:  Recent Labs Lab 02/23/15 0635 02/23/15 1325 02/23/15 1945  TROPONINI <0.03 <0.03 0.04*   BNP: BNP (last 3 results) No results for input(s): BNP in the last 8760 hours.  ProBNP (last 3 results) No results for input(s): PROBNP in the last 8760 hours.  CBG: No results for input(s): GLUCAP in the last 168 hours.     Signed:  Eri Mcevers A  [facility] 02/26/2015, 11:16 AM

## 2015-02-26 NOTE — Progress Notes (Signed)
[facility].  CSW facilitated pt discharge needs including contacting facility, faxing pt discharge information via [employer], discussing with pt at bedside and leaving multiple messages with pt daughter via telephone, providing RN phone number to call report, and arranging ambulance [facility].   No further social work needs identified at this time.   CSW signing off.   Loletta SpecterSuzanna Kidd, MSW, LCSW Clinical Social Work [phone]

## 2015-02-26 NOTE — Clinical Social Work Placement (Signed)
   CLINICAL SOCIAL WORK PLACEMENT  NOTE  Date:  02/26/2015  Patient Details  Name: Samuel [name] MRN: [id number] Date of Birth: [birth year]  Clinical Social Work is seeking post-discharge placement for this patient at the Skilled  Nursing Facility level of care (*CSW will initial, date and re-position this form in  chart as items are completed):  Yes   Patient/family provided with [facility] Clinical Social Work Department's list of facilities offering this level of care within the geographic area requested by the patient (or if unable, by the patient's family).  Yes   Patient/family informed of their freedom to choose among providers that offer the needed level of care, that participate in Medicare, Medicaid or managed care program needed by the patient, have an available bed and are willing to accept the patient.  Yes   Patient/family informed of [facility]'s ownership interest [facility], as well as of the fact that they are under no obligation to receive care at these facilities.  PASRR submitted to EDS on       PASRR number received on       Existing PASRR number confirmed on 02/24/15     FL2 transmitted to all facilities in geographic area requested by pt/family on 02/24/15     FL2 transmitted to all facilities within larger geographic area on       Patient informed that his/her managed care company has contracts with or will negotiate with certain facilities, including the following:        Yes   Patient/family informed of bed offers received.  [facility]     Physician recommends and patient chooses bed at      [facility].  Patient to be transferred to facility by ambulance Sharin Mons([facility])     Patient family notified on 02/26/15 of transfer.  Name of family member notified:  pt notified at bedside; message left for pt daughter, Samuel [name]      PHYSICIAN Please sign FL2     Additional Comment:    _______________________________________________ Orson EvaKIDD, Estiven Kohan A, LCSW 02/26/2015, 12:28 PM

## 2015-02-26 NOTE — Progress Notes (Signed)
CSW continuing to follow.   CSW followed up with pt at bedside to give SNF bed offers.   CSW discussed SNF bed offers with pt. [facility]  [facility].   Per MD, pt medically ready for discharge today.  CSW to facilitate pt discharge needs this afternoon.  Loletta SpecterSuzanna Asjah Rauda, MSW, LCSW Clinical Social Work [phone]

## 2015-02-26 NOTE — Progress Notes (Addendum)
Pt 's sacral area and scrotum is bruised from his fall at home, also his Rt. Hip has an ecchymotic area ,size of a quarter. Report called to [facility]     At nursing home. Transferred via ambulance

## 2015-03-01 ENCOUNTER — Encounter: Payer: Self-pay | Admitting: Internal Medicine

## 2015-03-01 ENCOUNTER — Non-Acute Institutional Stay (SKILLED_NURSING_FACILITY): Payer: Medicare Other | Admitting: Internal Medicine

## 2015-03-01 ENCOUNTER — Ambulatory Visit: Payer: Medicare Other | Admitting: Thoracic Surgery (Cardiothoracic Vascular Surgery)

## 2015-03-01 DIAGNOSIS — IMO0001 Reserved for inherently not codable concepts without codable children: Secondary | ICD-10-CM

## 2015-03-01 DIAGNOSIS — R509 Fever, unspecified: Secondary | ICD-10-CM | POA: Diagnosis not present

## 2015-03-01 DIAGNOSIS — Z954 Presence of other heart-valve replacement: Secondary | ICD-10-CM | POA: Diagnosis not present

## 2015-03-01 DIAGNOSIS — F1010 Alcohol abuse, uncomplicated: Secondary | ICD-10-CM

## 2015-03-01 DIAGNOSIS — J449 Chronic obstructive pulmonary disease, unspecified: Secondary | ICD-10-CM | POA: Diagnosis not present

## 2015-03-01 DIAGNOSIS — R197 Diarrhea, unspecified: Secondary | ICD-10-CM

## 2015-03-01 DIAGNOSIS — Z952 Presence of prosthetic heart valve: Secondary | ICD-10-CM

## 2015-03-01 DIAGNOSIS — R5381 Other malaise: Secondary | ICD-10-CM

## 2015-03-01 NOTE — Progress Notes (Signed)
Patient ID: Samuel [name], male   DOB: 1926/12/01, 81 y.o.   MRN: [id number]    HISTORY AND PHYSICAL   DATE: 03/01/15  Location:  [facility]    Place of Service: SNF (31)   Extended Emergency Contact Information Primary Emergency Contact: Lorinda Creed, [place] [address] [place] Phone: [phone] Mobile Phone: [phone] Relation: Daughter Secondary Emergency Contact: Coffey,Brenda Address: [address]          [place],  [address] [place] Phone: [phone] Mobile Phone: [phone] Relation: Sister  Advanced Directive information  [place]  Chief Complaint  Patient presents with  . New Admit To SNF    HPI:  81 yo male seen today as a new admission into SNF following hospital stay for dehydration due to N/V, Etoh abuse, repeat falls, COPD, s/p AVR due to AS, HTN, severe protein calorie malnutrition. Na 129 and Cl 90 on admission but improved to 138 and 99 respectively with IVF. Liver enzymes elevated ALT/AST 92/154 at d/c.   Today he c/o sev diarrhea, malaise. No abdominal pain, N/V since yesterday. He does have chills. Pt is a poor historian due to memory loss. Hx obtained from chart. No nursing issues. No falls at [place]. Appetite reduced. Sleeping okay.  COPD - stable off meds  Depression - mood stable overall  Hx Etoh abuse - xanax prn agitation  HTN - on lasix, metoprolol, asa  Chronic back pain - stable on prn tramafdol  Also takes vitamins and minerals  Past Medical History  Diagnosis Date  . Depression   . COPD (chronic obstructive pulmonary disease) ([place])   . Aortic stenosis     Echo 7/14:  Severe LVH, EF 55-60%, severe AS, mean gradient 78 mmHg, MAC, mild LAE  . Essential hypertension, benign   . Decreased libido   . DIZZINESS, CHRONIC   . Alcohol abuse   . Shortness of breath 10/24/11    "all the time"  . Arthritis     "in the low vertebra"  . Chronic lower  back pain   . Scoliosis/kyphoscoliosis 10/28/2011  . Complication of anesthesia     NO NECK LINES ON RIGHT FOR SURGERY  . Heart murmur   . GERD (gastroesophageal reflux disease)     occ  . Abdominal aortic aneurysm ([place]) 10/29/2011    3.7cm AAA by CT scan with unusual appearance suggestive of penetrating atherosclerotic ulcer or localized dissection  . CHF (congestive heart failure) ([place])   . 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 Perivalvular leak of prosthetic heart valve     echo (02/21/13): Mild LVH, EF 55-60%, aortic valve with mild to moderate perivalvular leak, AI, mean aortic valve gradient 6 mm of mercury, MAC.    Past Surgical History  Procedure Laterality Date  . No past surgeries    . Transcatheter aortic valve replacement, transaortic N/A 01/21/2013    Procedure: TRANSCATHETER AORTIC VALVE REPLACEMENT, TRANSAORTIC;  Surgeon: Rexene Alberts, MD;  Location: [place];  Service: Open Heart Surgery;  Laterality: N/A;  . Intraoperative transesophageal echocardiogram N/A 01/21/2013    Procedure: INTRAOPERATIVE TRANSESOPHAGEAL ECHOCARDIOGRAM;  Surgeon: Rexene Alberts, MD;  Location: [facility];  Service: Open Heart Surgery;  Laterality: N/A;  . Thoracotomy Right 01/21/2013    Procedure:  MINI/LIMITED THORACOTOMY;  Surgeon: Rexene Alberts, MD;  Location: [facility];  Service: Open Heart Surgery;  Laterality: Right;  . Left and right heart catheterization with coronary angiogram N/A 10/27/2011    Procedure: LEFT AND RIGHT HEART CATHETERIZATION WITH CORONARY ANGIOGRAM;  Surgeon: Hillary Bow, MD;  Location: [facility] CATH LAB;  Service: Cardiovascular;  Laterality: N/A;    Patient Care Team: Jilda Panda, MD as PCP - General (Internal Medicine) Hillary Bow, MD (Cardiology) Lelon Perla, MD (Cardiology)  Social History   Social History    . Marital Status: Widowed    Spouse Name: N/A  . Number of Children: N/A  . Years of Education: N/A   Occupational History  . Not on file.   Social History Main Topics  . Smoking status: Former Smoker -- 2.5 years    Types: Cigarettes    Quit date: 07/02/1952  . Smokeless tobacco: Never Used     Comment: wine x2 q nite in last week 01/17/13  . Alcohol Use: 37.8 oz/week    42 Glasses of wine, 21 Cans of beer per week     Comment: 10/24/11 "drink q day; quite a bit"; Started 1974 (mostly hard liquor)  . Drug Use: No  . Sexual Activity: No   Other Topics Concern  . Not on file   Social History Narrative   Retired worked for [employer] in [employer]   Divorced: 2022/06/11 and 2nd died and 3rd was separated   Never Smoked   Alcohol use -yes   Lives alone     reports that he quit smoking about 62 years ago. His smoking use included Cigarettes. He quit after 2.5 years of use. He has never used smokeless tobacco. He reports that he drinks about 37.8 oz of alcohol per week. He reports that he does not use illicit drugs.  Family History  Problem Relation Age of Onset  . Other      no known medical hx of heart disease  . Heart attack Father 33  . Stroke Father   . Heart failure Mother 7  . Thyroid disease Mother   . Heart attack Brother 22  . Diabetes Brother   . Heart attack Brother 33  . Diabetes Sister   . Hypertension Sister    Family Status  Relation Status Death Age  . Mother Deceased   . Father Deceased   . Brother Deceased   . Brother Deceased   . Sister Deceased      There is no immunization history on file for this patient.  No Known Allergies  Medications: Patient's Medications  New Prescriptions   No medications on file  Previous Medications   ALPRAZOLAM (XANAX) 0.5 MG TABLET    Take 1 tablet (0.5 mg total) by mouth 2 (two) times daily as needed for anxiety.   ASPIRIN EC 325 MG TABLET    Take 1 tablet (325 mg total) by mouth every other day.    BIOFLAVONOID PRODUCTS (ESTER C PO)    Take 1,000 mg by mouth daily.   CHOLECALCIFEROL (VITAMIN D-3) 5000 UNITS TABS    Take 1 capsule by mouth.   CYANOCOBALAMIN (VITAMIN B-12 SL)    Place 1 tablet under the tongue daily.   FUROSEMIDE (LASIX) 20 MG TABLET    Take 10 mg by mouth daily.   METOPROLOL TARTRATE (LOPRESSOR) 25 MG TABLET    Take 12.5 mg by mouth daily.   MULTIPLE VITAMIN (MULTIVITAMIN WITH MINERALS) TABS  Take 1 tablet by mouth daily.   ONDANSETRON (ZOFRAN) 4 MG TABLET    Take 1 tablet (4 mg total) by mouth every 6 (six) hours as needed for nausea.   POTASSIUM GLUCONATE ER PO    Take by mouth daily. OTC   SELENIUM PO    Take 1 tablet by mouth daily.   TRAMADOL (ULTRAM) 50 MG TABLET    Take 1 tablet (50 mg total) by mouth 2 (two) times daily as needed for moderate pain.   VITAMIN B-12 (CYANOCOBALAMIN) 100 MCG TABLET    Take 50 mcg by mouth daily.     ZINC CITRATE-PHYTASE PO    Take by mouth.  Modified Medications   No medications on file  Discontinued Medications   No medications on file    Review of Systems  Unable to perform ROS: Psychiatric disorder    Filed Vitals:   03/01/15 2139  BP: 131/62  Pulse: 78  Temp: 100.3 F (37.9 C)  SpO2: 93%   There is no weight on file to calculate BMI.  Physical Exam  Constitutional: He appears well-developed and well-nourished.  HENT:  Mouth/Throat: Oropharynx is clear and moist. Mucous membranes are dry.  Eyes: Pupils are equal, round, and reactive to light. No scleral icterus.  Neck: Neck supple. Carotid bruit is not present. No thyromegaly present.  Cardiovascular: Normal rate, regular rhythm, normal heart sounds and intact distal pulses.  Exam reveals no gallop and no friction rub.   No murmur heard. Aortic click noted. No LE edema b/l. No calf TTP  Pulmonary/Chest: Effort normal and breath sounds normal. He has no wheezes. He has no rales. He exhibits no tenderness.  Abdominal: Soft. He exhibits no distension, no  abdominal bruit, no pulsatile midline mass and no mass. Bowel sounds are increased. There is no tenderness. There is no rebound and no guarding.  Musculoskeletal: He exhibits edema and tenderness.  Lymphadenopathy:    He has no cervical adenopathy.  Neurological: He is alert.  Skin: Skin is warm and dry. No rash noted.  Psychiatric: He has a normal mood and affect. His behavior is normal.     Labs reviewed: Admission on 02/22/2015, Discharged on 02/26/2015  Component Date Value Ref Range Status  . Alcohol, Ethyl (B) 02/23/2015 <5  <5 mg/dL Final   Comment:        LOWEST DETECTABLE LIMIT FOR SERUM ALCOHOL IS 5 mg/dL FOR MEDICAL PURPOSES ONLY   . WBC 02/23/2015 4.4  4.0 - 10.5 K/uL Final  . RBC 02/23/2015 4.26  4.22 - 5.81 MIL/uL Final  . Hemoglobin 02/23/2015 14.0  13.0 - 17.0 g/dL Final  . HCT 02/23/2015 39.7  39.0 - 52.0 % Final  . MCV 02/23/2015 93.2  78.0 - 100.0 fL Final  . MCH 02/23/2015 32.9  26.0 - 34.0 pg Final  . MCHC 02/23/2015 35.3  30.0 - 36.0 g/dL Final  . RDW 02/23/2015 14.0  11.5 - 15.5 % Final  . Platelets 02/23/2015 105* 150 - 400 K/uL Final   Comment: RESULT REPEATED AND VERIFIED SPECIMEN CHECKED FOR CLOTS PLATELET COUNT CONFIRMED BY SMEAR LARGE PLATELETS PRESENT   . Neutrophils Relative % 02/23/2015 73   Final  . Neutro Abs 02/23/2015 3.2  1.7 - 7.7 K/uL Final  . Lymphocytes Relative 02/23/2015 15   Final  . Lymphs Abs 02/23/2015 0.7  0.7 - 4.0 K/uL Final  . Monocytes Relative 02/23/2015 11   Final  . Monocytes Absolute 02/23/2015 0.5  0.1 - 1.0 K/uL  Final  . Eosinophils Relative 02/23/2015 0   Final  . Eosinophils Absolute 02/23/2015 0.0  0.0 - 0.7 K/uL Final  . Basophils Relative 02/23/2015 0   Final  . Basophils Absolute 02/23/2015 0.0  0.0 - 0.1 K/uL Final  . Sodium 02/23/2015 129* 135 - 145 mmol/L Final  . Potassium 02/23/2015 4.6  3.5 - 5.1 mmol/L Final  . Chloride 02/23/2015 90* 101 - 111 mmol/L Final  . CO2 02/23/2015 25  22 - 32 mmol/L Final   . Glucose, Bld 02/23/2015 99  65 - 99 mg/dL Final  . BUN 02/23/2015 15  6 - 20 mg/dL Final  . Creatinine, Ser 02/23/2015 0.77  0.61 - 1.24 mg/dL Final  . Calcium 02/23/2015 8.5* 8.9 - 10.3 mg/dL Final  . Total Protein 02/23/2015 7.0  6.5 - 8.1 g/dL Final  . Albumin 02/23/2015 3.6  3.5 - 5.0 g/dL Final  . AST 02/23/2015 189* 15 - 41 U/L Final  . ALT 02/23/2015 97* 17 - 63 U/L Final  . Alkaline Phosphatase 02/23/2015 98  38 - 126 U/L Final  . Total Bilirubin 02/23/2015 2.4* 0.3 - 1.2 mg/dL Final  . GFR calc non Af Amer 02/23/2015 >60  >60 mL/min Final  . GFR calc Af Amer 02/23/2015 >60  >60 mL/min Final   Comment: (NOTE) The eGFR has been calculated using the CKD EPI equation. This calculation has not been validated in all clinical situations. eGFR's persistently <60 mL/min signify possible Chronic Kidney Disease.   . Anion gap 02/23/2015 14  5 - 15 Final  . Lipase 02/23/2015 33  11 - 51 U/L Final  . Troponin i, poc 02/23/2015 0.02  0.00 - 0.08 ng/mL Final  . Comment 3 02/23/2015          Final   Comment: Due to the release kinetics of cTnI, a negative result within the first hours of the onset of symptoms does not rule out myocardial infarction with certainty. If myocardial infarction is still suspected, repeat the test at appropriate intervals.   . Color, Urine 02/22/2015 AMBER* YELLOW Final   BIOCHEMICALS MAY BE AFFECTED BY COLOR  . APPearance 02/22/2015 CLEAR  CLEAR Final  . Specific Gravity, Urine 02/22/2015 1.013  1.005 - 1.030 Final  . pH 02/22/2015 5.5  5.0 - 8.0 Final  . Glucose, UA 02/22/2015 NEGATIVE  NEGATIVE mg/dL Final  . Hgb urine dipstick 02/22/2015 SMALL* NEGATIVE Final  . Bilirubin Urine 02/22/2015 NEGATIVE  NEGATIVE Final  . Ketones, ur 02/22/2015 15* NEGATIVE mg/dL Final  . Protein, ur 02/22/2015 30* NEGATIVE mg/dL Final  . Nitrite 02/22/2015 NEGATIVE  NEGATIVE Final  . Leukocytes, UA 02/22/2015 NEGATIVE  NEGATIVE Final  . Squamous Epithelial / LPF  02/22/2015 0-5* NONE SEEN Final   Please note change in reference range.  . WBC, UA 02/22/2015 0-5  0 - 5 WBC/hpf Final   Please note change in reference range.  . RBC / HPF 02/22/2015 0-5  0 - 5 RBC/hpf Final   Please note change in reference range.  . Bacteria, UA 02/22/2015 RARE* NONE SEEN Final   Please note change in reference range.  Marland Kitchen TSH 02/23/2015 2.330  0.350 - 4.500 uIU/mL Final  . WBC 02/23/2015 3.5* 4.0 - 10.5 K/uL Final  . RBC 02/23/2015 4.29  4.22 - 5.81 MIL/uL Final  . Hemoglobin 02/23/2015 13.9  13.0 - 17.0 g/dL Final  . HCT 02/23/2015 40.1  39.0 - 52.0 % Final  . MCV 02/23/2015 93.5  78.0 - 100.0 fL  Final  . [place] 02/23/2015 32.4  26.0 - 34.0 pg Final  . MCHC 02/23/2015 34.7  30.0 - 36.0 g/dL Final  . RDW 02/23/2015 14.2  11.5 - 15.5 % Final  . Platelets 02/23/2015 84* 150 - 400 K/uL Final   CONSISTENT WITH PREVIOUS RESULT  . Sodium 02/23/2015 133* 135 - 145 mmol/L Final  . Potassium 02/23/2015 4.9  3.5 - 5.1 mmol/L Final  . Chloride 02/23/2015 93* 101 - 111 mmol/L Final  . CO2 02/23/2015 28  22 - 32 mmol/L Final  . Glucose, Bld 02/23/2015 115* 65 - 99 mg/dL Final  . BUN 02/23/2015 17  6 - 20 mg/dL Final  . Creatinine, Ser 02/23/2015 0.93  0.61 - 1.24 mg/dL Final  . Calcium 02/23/2015 8.4* 8.9 - 10.3 mg/dL Final  . GFR calc non Af Amer 02/23/2015 >60  >60 mL/min Final  . GFR calc Af Amer 02/23/2015 >60  >60 mL/min Final   Comment: (NOTE) The eGFR has been calculated using the CKD EPI equation. This calculation has not been validated in all clinical situations. eGFR's persistently <60 mL/min signify possible Chronic Kidney Disease.   . Anion gap 02/23/2015 12  5 - 15 Final  . Troponin I 02/23/2015 <0.03  <0.031 ng/mL Final   Comment:        NO INDICATION OF MYOCARDIAL INJURY.   . Troponin I 02/23/2015 <0.03  <0.031 ng/mL Final   Comment:        NO INDICATION OF MYOCARDIAL INJURY.   . Troponin I 02/23/2015 0.04* <0.031 ng/mL Final   Comment:         PERSISTENTLY INCREASED TROPONIN VALUES IN THE RANGE OF 0.04-0.49 ng/mL CAN BE SEEN IN:       -UNSTABLE ANGINA       -CONGESTIVE HEART FAILURE       -MYOCARDITIS       -CHEST TRAUMA       -ARRYHTHMIAS       -LATE PRESENTING MYOCARDIAL INFARCTION       -COPD   CLINICAL FOLLOW-UP RECOMMENDED.   Marland Kitchen Sodium 02/25/2015 133* 135 - 145 mmol/L Final  . Potassium 02/25/2015 4.4  3.5 - 5.1 mmol/L Final  . Chloride 02/25/2015 97* 101 - 111 mmol/L Final  . CO2 02/25/2015 30  22 - 32 mmol/L Final  . Glucose, Bld 02/25/2015 147* 65 - 99 mg/dL Final  . BUN 02/25/2015 13  6 - 20 mg/dL Final  . Creatinine, Ser 02/25/2015 0.81  0.61 - 1.24 mg/dL Final  . Calcium 02/25/2015 7.9* 8.9 - 10.3 mg/dL Final  . Total Protein 02/25/2015 6.1* 6.5 - 8.1 g/dL Final  . Albumin 02/25/2015 3.1* 3.5 - 5.0 g/dL Final  . AST 02/25/2015 154* 15 - 41 U/L Final  . ALT 02/25/2015 92* 17 - 63 U/L Final  . Alkaline Phosphatase 02/25/2015 84  38 - 126 U/L Final  . Total Bilirubin 02/25/2015 1.1  0.3 - 1.2 mg/dL Final  . GFR calc non Af Amer 02/25/2015 >60  >60 mL/min Final  . GFR calc Af Amer 02/25/2015 >60  >60 mL/min Final   Comment: (NOTE) The eGFR has been calculated using the CKD EPI equation. This calculation has not been validated in all clinical situations. eGFR's persistently <60 mL/min signify possible Chronic Kidney Disease.   . Anion gap 02/25/2015 6  5 - 15 Final  . Sodium 02/26/2015 138  135 - 145 mmol/L Final  . Potassium 02/26/2015 4.3  3.5 - 5.1 mmol/L Final  . Chloride  02/26/2015 99* 101 - 111 mmol/L Final  . CO2 02/26/2015 32  22 - 32 mmol/L Final  . Glucose, Bld 02/26/2015 127* 65 - 99 mg/dL Final  . BUN 02/26/2015 13  6 - 20 mg/dL Final  . Creatinine, Ser 02/26/2015 0.83  0.61 - 1.24 mg/dL Final  . Calcium 02/26/2015 8.7* 8.9 - 10.3 mg/dL Final  . Phosphorus 02/26/2015 2.7  2.5 - 4.6 mg/dL Final  . Albumin 02/26/2015 3.0* 3.5 - 5.0 g/dL Final  . GFR calc non Af Amer 02/26/2015 >60  >60 mL/min  Final  . GFR calc Af Amer 02/26/2015 >60  >60 mL/min Final   Comment: (NOTE) The eGFR has been calculated using the CKD EPI equation. This calculation has not been validated in all clinical situations. eGFR's persistently <60 mL/min signify possible Chronic Kidney Disease.   . Anion gap 02/26/2015 7  5 - 15 Final    No results found.   Assessment/Plan   ICD-9-CM ICD-10-CM   1. Fever, unspecified 780.60 R50.9   2. Diarrhea, unspecified type 787.91 R19.7   3. Alcohol abuse - ongoing 305.00 F10.10   4. COPD bronchitis - stable 491.20 J44.9   5. S/P TAVR (transcatheter aortic valve replacement) V43.3 Z95.4   6. Physical deconditioning 799.3 R53.81     Check stool for C diff, CBC w diff, CMP, UA with micro, cx and s. Check CXR due to elevated temp  Cont current meds as ordered  PT/OT as ordered  GOAL: short term rehab and d/c home when medically appropriate. Communicated with pt and nursing.  Will follow  Duff Pozzi S. Perlie Gold  [facility] [address] [place], [place] [address] [phone] Cell (Monday-Friday 8 AM - 5 PM) [phone] After 5 PM and follow prompts

## 2015-03-10 ENCOUNTER — Other Ambulatory Visit (HOSPITAL_COMMUNITY): Payer: Self-pay | Admitting: Internal Medicine

## 2015-03-10 DIAGNOSIS — R1310 Dysphagia, unspecified: Secondary | ICD-10-CM

## 2015-03-11 ENCOUNTER — Ambulatory Visit (HOSPITAL_COMMUNITY)
Admission: RE | Admit: 2015-03-11 | Discharge: 2015-03-11 | Disposition: A | Discharge status: Home / Self Care | Payer: Medicare Other | Source: Ambulatory Visit | Attending: Internal Medicine | Admitting: Internal Medicine

## 2015-03-11 ENCOUNTER — Ambulatory Visit (HOSPITAL_COMMUNITY)
Admission: RE | Admit: 2015-03-11 | Discharge: 2015-03-11 | Disposition: A | Discharge status: Home / Self Care | Payer: PRIVATE HEALTH INSURANCE | Source: Ambulatory Visit | Attending: Internal Medicine | Admitting: Internal Medicine

## 2015-03-11 DIAGNOSIS — R1310 Dysphagia, unspecified: Secondary | ICD-10-CM

## 2015-03-11 DIAGNOSIS — J189 Pneumonia, unspecified organism: Secondary | ICD-10-CM | POA: Diagnosis not present

## 2015-03-11 DIAGNOSIS — R1319 Other dysphagia: Secondary | ICD-10-CM | POA: Diagnosis not present

## 2015-03-11 DIAGNOSIS — R1313 Dysphagia, pharyngeal phase: Secondary | ICD-10-CM | POA: Diagnosis not present

## 2015-03-15 ENCOUNTER — Encounter: Payer: Self-pay | Admitting: Internal Medicine

## 2015-03-15 ENCOUNTER — Non-Acute Institutional Stay (SKILLED_NURSING_FACILITY): Payer: Medicare Other | Admitting: Internal Medicine

## 2015-03-15 DIAGNOSIS — F411 Generalized anxiety disorder: Secondary | ICD-10-CM | POA: Diagnosis not present

## 2015-03-15 DIAGNOSIS — Z952 Presence of prosthetic heart valve: Secondary | ICD-10-CM

## 2015-03-15 DIAGNOSIS — I10 Essential (primary) hypertension: Secondary | ICD-10-CM | POA: Diagnosis not present

## 2015-03-15 DIAGNOSIS — F1010 Alcohol abuse, uncomplicated: Secondary | ICD-10-CM

## 2015-03-15 DIAGNOSIS — E43 Unspecified severe protein-calorie malnutrition: Secondary | ICD-10-CM

## 2015-03-15 DIAGNOSIS — Z954 Presence of other heart-valve replacement: Secondary | ICD-10-CM

## 2015-03-15 DIAGNOSIS — IMO0001 Reserved for inherently not codable concepts without codable children: Secondary | ICD-10-CM

## 2015-03-15 DIAGNOSIS — J449 Chronic obstructive pulmonary disease, unspecified: Secondary | ICD-10-CM | POA: Diagnosis not present

## 2015-03-15 DIAGNOSIS — R5381 Other malaise: Secondary | ICD-10-CM

## 2015-03-16 NOTE — Progress Notes (Signed)
Patient ID: Samuel [name], male   DOB: 08-Sep-1926, 81 y.o.   MRN: [id number]    DATE: 03/15/15  Location:  [facility]    Place of Service: SNF (31)   Extended Emergency Contact Information Primary Emergency Contact: Lorinda Creed, [place] [address] [place] Phone: [phone] Mobile Phone: [phone] Relation: Daughter Secondary Emergency Contact: Coffey,Brenda Address: [address]          [place], [place] [address] [place] of [place] Phone: [phone] Mobile Phone: [phone] Relation: Sister  Advanced Directive information Does patient have an advance directive?: Yes, Type of Advance Directive: Out of facility DNR (pink MOST or yellow form)  Chief Complaint  Patient presents with  . Discharge Note    HPI:  81 yo male short term resident seen today for d/c from SNF. He was tx for deconditioning due to dehydration, severe protein calorie malnutrition, Etoh abuse, falls, COPD and AVR due to AS. He has completed short term rehab.  Brief summary: he was admitted to SNF following admission for Na 129 and Cl 90 but improved to 138 and 99 respectively with IVF. Liver enzymes elevated ALT/AST 92/154 at d/c.    He is being d/c'd home with [place] PT/OT/ST.  He is a poor historian due to psych d/o. Hx obtained from chart  COPD - stable off meds. No exacerbations  Depression - mood stable overall. He takes prn xanax  Hx Etoh abuse - xanax prn agitation  HTN - on lasix, metoprolol, asa. BP stable  Chronic back pain - stable on prn tramafdol  Also takes vitamins and minerals  Past Medical History  Diagnosis Date  . Depression   . COPD (chronic obstructive pulmonary disease) ([place])   . Aortic stenosis     Echo 7/14:  Severe LVH, EF 55-60%, severe AS, mean gradient 78 mmHg, MAC, mild LAE  . Essential hypertension, benign   . Decreased libido   . DIZZINESS, CHRONIC   . Alcohol abuse   . Shortness of breath  10/24/11    "all the time"  . Arthritis     "in the low vertebra"  . Chronic lower back pain   . Scoliosis/kyphoscoliosis 10/28/2011  . Complication of anesthesia     NO NECK LINES ON RIGHT FOR SURGERY  . Heart murmur   . GERD (gastroesophageal reflux disease)     occ  . Abdominal aortic aneurysm ([place]) 10/29/2011    3.7cm AAA by CT scan with unusual appearance suggestive of penetrating atherosclerotic ulcer or localized dissection  . CHF (congestive heart failure) ([place])   . 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 Perivalvular leak of prosthetic heart valve     echo (02/21/13): Mild LVH, EF 55-60%, aortic valve with mild to moderate perivalvular leak, AI, mean aortic valve gradient 6 mm of mercury, MAC.    Past Surgical History  Procedure Laterality Date  . No past surgeries    . Transcatheter aortic valve replacement, transaortic N/A 01/21/2013    Procedure: TRANSCATHETER AORTIC VALVE REPLACEMENT, TRANSAORTIC;  Surgeon: Rexene Alberts, MD;  Location: [place];  Service: Open Heart Surgery;  Laterality: N/A;  . Intraoperative transesophageal echocardiogram N/A 01/21/2013    Procedure: INTRAOPERATIVE TRANSESOPHAGEAL ECHOCARDIOGRAM;  Surgeon: Rexene Alberts, MD;  Location: [place];  Service:  Open Heart Surgery;  Laterality: N/A;  . Thoracotomy Right 01/21/2013    Procedure: MINI/LIMITED THORACOTOMY;  Surgeon: Rexene Alberts, MD;  Location: [facility];  Service: Open Heart Surgery;  Laterality: Right;  . Left and right heart catheterization with coronary angiogram N/A 10/27/2011    Procedure: LEFT AND RIGHT HEART CATHETERIZATION WITH CORONARY ANGIOGRAM;  Surgeon: Hillary Bow, MD;  Location: [facility] CATH LAB;  Service: Cardiovascular;  Laterality: N/A;    Patient Care Team: Jilda Panda, MD as PCP - General (Internal Medicine) Hillary Bow, MD  (Cardiology) Lelon Perla, MD (Cardiology)  Social History   Social History  . Marital Status: Widowed    Spouse Name: N/A  . Number of Children: N/A  . Years of Education: N/A   Occupational History  . Not on file.   Social History Main Topics  . Smoking status: Former Smoker -- 2.5 years    Types: Cigarettes    Quit date: 07/02/1952  . Smokeless tobacco: Never Used     Comment: wine x2 q nite in last week 01/17/13  . Alcohol Use: 37.8 oz/week    42 Glasses of wine, 21 Cans of beer per week     Comment: 10/24/11 "drink q day; quite a bit"; Started 1974 (mostly hard liquor)  . Drug Use: No  . Sexual Activity: No   Other Topics Concern  . Not on file   Social History Narrative   Retired worked for [employer] in [employer]   Divorced: 06/12/2022 and 2nd died and 3rd was separated   Never Smoked   Alcohol use -yes   Lives alone     reports that he quit smoking about 62 years ago. His smoking use included Cigarettes. He quit after 2.5 years of use. He has never used smokeless tobacco. He reports that he drinks about 37.8 oz of alcohol per week. He reports that he does not use illicit drugs.   There is no immunization history on file for this patient.  No Known Allergies  Medications: Patient's Medications  New Prescriptions   No medications on file  Previous Medications   ALPRAZOLAM (XANAX) 0.5 MG TABLET    Take 1 tablet (0.5 mg total) by mouth 2 (two) times daily as needed for anxiety.   ASPIRIN EC 325 MG TABLET    Take 1 tablet (325 mg total) by mouth every other day.   BIOFLAVONOID PRODUCTS (ESTER C PO)    Take 1,000 mg by mouth daily.   CHOLECALCIFEROL (VITAMIN D-3) 5000 UNITS TABS    Take 1 capsule by mouth.   FUROSEMIDE (LASIX) 20 MG TABLET    Take 10 mg by mouth daily.   METOPROLOL TARTRATE (LOPRESSOR) 25 MG TABLET    Take 12.5 mg by mouth daily.   MULTIPLE VITAMIN (MULTIVITAMIN WITH MINERALS) TABS    Take 1 tablet by mouth daily.   ONDANSETRON  (ZOFRAN) 4 MG TABLET    Take 1 tablet (4 mg total) by mouth every 6 (six) hours as needed for nausea.   POTASSIUM GLUCONATE ER PO    Take by mouth daily. OTC   SELENIUM PO    Take 1 tablet by mouth daily.   TRAMADOL (ULTRAM) 50 MG TABLET    Take 1 tablet (50 mg total) by mouth 2 (two) times daily as needed for moderate pain.   VITAMIN B-12 (CYANOCOBALAMIN) 100 MCG TABLET    Take 50 mcg by mouth daily.    Modified Medications   No  medications on file  Discontinued Medications   CYANOCOBALAMIN (VITAMIN B-12 SL)    Place 1 tablet under the tongue daily.   ZINC CITRATE-PHYTASE PO    Take by mouth.    Review of Systems  Unable to perform ROS: Psychiatric disorder    Filed Vitals:   03/15/15 1514  Pulse: 75  Temp: 97.5 F (36.4 C)  TempSrc: Oral  Resp: 18  Weight: 149 lb 9.6 oz (67.858 kg)   Body mass index is 20.28 kg/(m^2).  Physical Exam  Constitutional: He appears well-developed.  Neurological: He is alert.  Skin: Skin is warm and dry. No rash noted.  Psychiatric: He has a normal mood and affect. His behavior is normal.     Labs reviewed: Admission on 02/22/2015, Discharged on 02/26/2015  Component Date Value Ref Range Status  . Alcohol, Ethyl (B) 02/23/2015 <5  <5 mg/dL Final   Comment:        LOWEST DETECTABLE LIMIT FOR SERUM ALCOHOL IS 5 mg/dL FOR MEDICAL PURPOSES ONLY   . WBC 02/23/2015 4.4  4.0 - 10.5 K/uL Final  . RBC 02/23/2015 4.26  4.22 - 5.81 MIL/uL Final  . Hemoglobin 02/23/2015 14.0  13.0 - 17.0 g/dL Final  . HCT 02/23/2015 39.7  39.0 - 52.0 % Final  . MCV 02/23/2015 93.2  78.0 - 100.0 fL Final  . MCH 02/23/2015 32.9  26.0 - 34.0 pg Final  . MCHC 02/23/2015 35.3  30.0 - 36.0 g/dL Final  . RDW 02/23/2015 14.0  11.5 - 15.5 % Final  . Platelets 02/23/2015 105* 150 - 400 K/uL Final   Comment: RESULT REPEATED AND VERIFIED SPECIMEN CHECKED FOR CLOTS PLATELET COUNT CONFIRMED BY SMEAR LARGE PLATELETS PRESENT   . Neutrophils Relative % 02/23/2015 73   Final    . Neutro Abs 02/23/2015 3.2  1.7 - 7.7 K/uL Final  . Lymphocytes Relative 02/23/2015 15   Final  . Lymphs Abs 02/23/2015 0.7  0.7 - 4.0 K/uL Final  . Monocytes Relative 02/23/2015 11   Final  . Monocytes Absolute 02/23/2015 0.5  0.1 - 1.0 K/uL Final  . Eosinophils Relative 02/23/2015 0   Final  . Eosinophils Absolute 02/23/2015 0.0  0.0 - 0.7 K/uL Final  . Basophils Relative 02/23/2015 0   Final  . Basophils Absolute 02/23/2015 0.0  0.0 - 0.1 K/uL Final  . Sodium 02/23/2015 129* 135 - 145 mmol/L Final  . Potassium 02/23/2015 4.6  3.5 - 5.1 mmol/L Final  . Chloride 02/23/2015 90* 101 - 111 mmol/L Final  . CO2 02/23/2015 25  22 - 32 mmol/L Final  . Glucose, Bld 02/23/2015 99  65 - 99 mg/dL Final  . BUN 02/23/2015 15  6 - 20 mg/dL Final  . Creatinine, Ser 02/23/2015 0.77  0.61 - 1.24 mg/dL Final  . Calcium 02/23/2015 8.5* 8.9 - 10.3 mg/dL Final  . Total Protein 02/23/2015 7.0  6.5 - 8.1 g/dL Final  . Albumin 02/23/2015 3.6  3.5 - 5.0 g/dL Final  . AST 02/23/2015 189* 15 - 41 U/L Final  . ALT 02/23/2015 97* 17 - 63 U/L Final  . Alkaline Phosphatase 02/23/2015 98  38 - 126 U/L Final  . Total Bilirubin 02/23/2015 2.4* 0.3 - 1.2 mg/dL Final  . GFR calc non Af Amer 02/23/2015 >60  >60 mL/min Final  . GFR calc Af Amer 02/23/2015 >60  >60 mL/min Final   Comment: (NOTE) The eGFR has been calculated using the CKD EPI equation. This calculation has not been validated  in all clinical situations. eGFR's persistently <60 mL/min signify possible Chronic Kidney Disease.   . Anion gap 02/23/2015 14  5 - 15 Final  . Lipase 02/23/2015 33  11 - 51 U/L Final  . Troponin i, poc 02/23/2015 0.02  0.00 - 0.08 ng/mL Final  . Comment 3 02/23/2015          Final   Comment: Due to the release kinetics of cTnI, a negative result within the first hours of the onset of symptoms does not rule out myocardial infarction with certainty. If myocardial infarction is still suspected, repeat the test at appropriate  intervals.   . Color, Urine 02/22/2015 AMBER* YELLOW Final   BIOCHEMICALS MAY BE AFFECTED BY COLOR  . APPearance 02/22/2015 CLEAR  CLEAR Final  . Specific Gravity, Urine 02/22/2015 1.013  1.005 - 1.030 Final  . pH 02/22/2015 5.5  5.0 - 8.0 Final  . Glucose, UA 02/22/2015 NEGATIVE  NEGATIVE mg/dL Final  . Hgb urine dipstick 02/22/2015 SMALL* NEGATIVE Final  . Bilirubin Urine 02/22/2015 NEGATIVE  NEGATIVE Final  . Ketones, ur 02/22/2015 15* NEGATIVE mg/dL Final  . Protein, ur 02/22/2015 30* NEGATIVE mg/dL Final  . Nitrite 02/22/2015 NEGATIVE  NEGATIVE Final  . Leukocytes, UA 02/22/2015 NEGATIVE  NEGATIVE Final  . Squamous Epithelial / LPF 02/22/2015 0-5* NONE SEEN Final   Please note change in reference range.  . WBC, UA 02/22/2015 0-5  0 - 5 WBC/hpf Final   Please note change in reference range.  . RBC / HPF 02/22/2015 0-5  0 - 5 RBC/hpf Final   Please note change in reference range.  . Bacteria, UA 02/22/2015 RARE* NONE SEEN Final   Please note change in reference range.  Marland Kitchen TSH 02/23/2015 2.330  0.350 - 4.500 uIU/mL Final  . WBC 02/23/2015 3.5* 4.0 - 10.5 K/uL Final  . RBC 02/23/2015 4.29  4.22 - 5.81 MIL/uL Final  . Hemoglobin 02/23/2015 13.9  13.0 - 17.0 g/dL Final  . HCT 02/23/2015 40.1  39.0 - 52.0 % Final  . MCV 02/23/2015 93.5  78.0 - 100.0 fL Final  . MCH 02/23/2015 32.4  26.0 - 34.0 pg Final  . MCHC 02/23/2015 34.7  30.0 - 36.0 g/dL Final  . RDW 02/23/2015 14.2  11.5 - 15.5 % Final  . Platelets 02/23/2015 84* 150 - 400 K/uL Final   CONSISTENT WITH PREVIOUS RESULT  . Sodium 02/23/2015 133* 135 - 145 mmol/L Final  . Potassium 02/23/2015 4.9  3.5 - 5.1 mmol/L Final  . Chloride 02/23/2015 93* 101 - 111 mmol/L Final  . CO2 02/23/2015 28  22 - 32 mmol/L Final  . Glucose, Bld 02/23/2015 115* 65 - 99 mg/dL Final  . BUN 02/23/2015 17  6 - 20 mg/dL Final  . Creatinine, Ser 02/23/2015 0.93  0.61 - 1.24 mg/dL Final  . Calcium 02/23/2015 8.4* 8.9 - 10.3 mg/dL Final  . GFR calc non  Af Amer 02/23/2015 >60  >60 mL/min Final  . GFR calc Af Amer 02/23/2015 >60  >60 mL/min Final   Comment: (NOTE) The eGFR has been calculated using the CKD EPI equation. This calculation has not been validated in all clinical situations. eGFR's persistently <60 mL/min signify possible Chronic Kidney Disease.   . Anion gap 02/23/2015 12  5 - 15 Final  . Troponin I 02/23/2015 <0.03  <0.031 ng/mL Final   Comment:        NO INDICATION OF MYOCARDIAL INJURY.   . Troponin I 02/23/2015 <0.03  <0.031 ng/mL  Final   Comment:        NO INDICATION OF MYOCARDIAL INJURY.   . Troponin I 02/23/2015 0.04* <0.031 ng/mL Final   Comment:        PERSISTENTLY INCREASED TROPONIN VALUES IN THE RANGE OF 0.04-0.49 ng/mL CAN BE SEEN IN:       -UNSTABLE ANGINA       -CONGESTIVE HEART FAILURE       -MYOCARDITIS       -CHEST TRAUMA       -ARRYHTHMIAS       -LATE PRESENTING MYOCARDIAL INFARCTION       -COPD   CLINICAL FOLLOW-UP RECOMMENDED.   Marland Kitchen Sodium 02/25/2015 133* 135 - 145 mmol/L Final  . Potassium 02/25/2015 4.4  3.5 - 5.1 mmol/L Final  . Chloride 02/25/2015 97* 101 - 111 mmol/L Final  . CO2 02/25/2015 30  22 - 32 mmol/L Final  . Glucose, Bld 02/25/2015 147* 65 - 99 mg/dL Final  . BUN 02/25/2015 13  6 - 20 mg/dL Final  . Creatinine, Ser 02/25/2015 0.81  0.61 - 1.24 mg/dL Final  . Calcium 02/25/2015 7.9* 8.9 - 10.3 mg/dL Final  . Total Protein 02/25/2015 6.1* 6.5 - 8.1 g/dL Final  . Albumin 02/25/2015 3.1* 3.5 - 5.0 g/dL Final  . AST 02/25/2015 154* 15 - 41 U/L Final  . ALT 02/25/2015 92* 17 - 63 U/L Final  . Alkaline Phosphatase 02/25/2015 84  38 - 126 U/L Final  . Total Bilirubin 02/25/2015 1.1  0.3 - 1.2 mg/dL Final  . GFR calc non Af Amer 02/25/2015 >60  >60 mL/min Final  . GFR calc Af Amer 02/25/2015 >60  >60 mL/min Final   Comment: (NOTE) The eGFR has been calculated using the CKD EPI equation. This calculation has not been validated in all clinical situations. eGFR's persistently <60  mL/min signify possible Chronic Kidney Disease.   . Anion gap 02/25/2015 6  5 - 15 Final  . Sodium 02/26/2015 138  135 - 145 mmol/L Final  . Potassium 02/26/2015 4.3  3.5 - 5.1 mmol/L Final  . Chloride 02/26/2015 99* 101 - 111 mmol/L Final  . CO2 02/26/2015 32  22 - 32 mmol/L Final  . Glucose, Bld 02/26/2015 127* 65 - 99 mg/dL Final  . BUN 02/26/2015 13  6 - 20 mg/dL Final  . Creatinine, Ser 02/26/2015 0.83  0.61 - 1.24 mg/dL Final  . Calcium 02/26/2015 8.7* 8.9 - 10.3 mg/dL Final  . Phosphorus 02/26/2015 2.7  2.5 - 4.6 mg/dL Final  . Albumin 02/26/2015 3.0* 3.5 - 5.0 g/dL Final  . GFR calc non Af Amer 02/26/2015 >60  >60 mL/min Final  . GFR calc Af Amer 02/26/2015 >60  >60 mL/min Final   Comment: (NOTE) The eGFR has been calculated using the CKD EPI equation. This calculation has not been validated in all clinical situations. eGFR's persistently <60 mL/min signify possible Chronic Kidney Disease.   . Anion gap 02/26/2015 7  5 - 15 Final    Dg Op Swallowing Func-medicare/speech Path  03/11/2015  CLINICAL DATA:  Pneumonia.  Dysphagia. EXAM: MODIFIED BARIUM SWALLOW TECHNIQUE: Different consistencies of barium were administered orally to the patient by the Speech Pathologist. Imaging of the pharynx was performed in the lateral projection. FLUOROSCOPY TIME:  Radiation Exposure Index (as provided by the fluoroscopic device): If the device does not provide the exposure index: Fluoroscopy Time:  2 minutes, 31 seconds Number of Acquired Images:  0 COMPARISON:  None. FINDINGS: Thin liquid: Laryngeal penetration  and multiple episodes of aspiration. With chin-tuck, there is laryngeal penetration. Nectar thick: Frank tracheal aspiration which resolved with chin-tuck. Laryngeal penetration resolved with chin-tuck. Premature spill noted. Delayed swallowing trigger. Puree: Delayed swallowing trigger.  Residue/retention. Puree with cracker:  Delayed swallow trigger.  Retention. IMPRESSION: 1. Laryngeal  penetration aspiration with thin liquid and nectar thick liquid. With nectar thick liquid, a chin-tuck resolved the aspiration and penetration. 2. Delayed swallowing trigger and retention. Please refer to the Speech Pathologists report for complete details and recommendations. Electronically Signed   By: Van Clines M.D.   On: 03/11/2015 13:09 Objective Swallowing Evaluation:   Patient Details Name: Samuel [name] MRN: [id number] Date of Birth: 1926/08/31 Today's Date: 03/11/2015 Time: SLP Start Time (ACUTE ONLY): 1159-SLP Stop Time (ACUTE ONLY): 1223 SLP Time Calculation (min) (ACUTE ONLY): 24 min Past Medical History: Past Medical History Diagnosis Date . Depression  . COPD (chronic obstructive pulmonary disease) ([place])  . Aortic stenosis    Echo 7/14:  Severe LVH, EF 55-60%, severe AS, mean gradient 78 mmHg, MAC, mild LAE . Essential hypertension, benign  . Decreased libido  . DIZZINESS, CHRONIC  . Alcohol abuse  . Shortness of breath 10/24/11   "all the time" . Arthritis    "in the low vertebra" . Chronic lower back pain  . Scoliosis/kyphoscoliosis 10/28/2011 . Complication of anesthesia    NO NECK LINES ON RIGHT FOR SURGERY . Heart murmur  . GERD (gastroesophageal reflux disease)    occ . Abdominal aortic aneurysm ([place]) 10/29/2011   3.7cm AAA by CT scan with unusual appearance suggestive of penetrating atherosclerotic ulcer or localized dissection . CHF (congestive heart failure) ([place])  . 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 Perivalvular leak of prosthetic heart valve    echo (02/21/13): Mild LVH, EF 55-60%, aortic valve with mild to moderate perivalvular leak, AI, mean aortic valve gradient 6 mm of mercury, MAC. Past Surgical History: Past Surgical History Procedure Laterality Date . No past surgeries   . Transcatheter aortic valve replacement, transaortic  N/A 01/21/2013   Procedure: TRANSCATHETER AORTIC VALVE REPLACEMENT, TRANSAORTIC;  Surgeon: Rexene Alberts, MD;  Location: [facility];  Service: Open Heart Surgery;  Laterality: N/A; . Intraoperative transesophageal echocardiogram N/A 01/21/2013   Procedure: INTRAOPERATIVE TRANSESOPHAGEAL ECHOCARDIOGRAM;  Surgeon: Rexene Alberts, MD;  Location: [facility];  Service: Open Heart Surgery;  Laterality: N/A; . Thoracotomy Right 01/21/2013   Procedure: MINI/LIMITED THORACOTOMY;  Surgeon: Rexene Alberts, MD;  Location: [facility];  Service: Open Heart Surgery;  Laterality: Right; . Left and right heart catheterization with coronary angiogram N/A 10/27/2011   Procedure: LEFT AND RIGHT HEART CATHETERIZATION WITH CORONARY ANGIOGRAM;  Surgeon: Hillary Bow, MD;  Location[facility];  Service: Cardiovascular;  Laterality: N/A; HPI: 81 yo male with PMH of dysphagia presents for OP MBS. Most recent MBS in 2014 showed a strong cough reflex that assisted with airway protection by clearing all penetrated/aspirated material. He has had a recent bout of PNA at his SNF, and therefore swallowing reassessment was indicated. Subjective: pt describes a chronic dysphagia that he manages with small bites and sips Assessment / Plan / Recommendation CHL IP CLINICAL IMPRESSIONS 03/11/2015 Therapy Diagnosis Moderate pharyngeal phase dysphagia;Moderate cervical esophageal phase dysphagia  Clinical Impression Pt has a moderate pharyngeal and cervical esophageal dysphagia that is further impaired from most recent MBS in 2014. Anterior  curvature of cervical spine continues to impede bolus flow through the UES, leading to pooling of barium above the CP segment and in the pyrifrom sinuses, with thin liquids spilling over into the airway during the swallow. Pt now also has a significant delay in swallow trigger, which leads to further aspiration before the swallow with thin and nectar thcik liquids. Orally, he has a tendency to hold and "swish" barium prior to  swallowing, which prolongs this delay even further. Aspiration is sensed, but coughing is not effective at clearing aspirates. A chin tuck posture and Mod cues to swallow "hard" and "fast" (with single sips) appear most effective at increasing airway protection with nectar thick liquids. Would continue mechanical soft diet but with nectar thick liquids and swallowing precautions as described above.  Impact on safety and function Moderate aspiration risk   CHL IP TREATMENT RECOMMENDATION 03/11/2015 Treatment Recommendations Defer treatment plan to f/u with SLP   Prognosis 03/11/2015 Prognosis for Safe Diet Advancement Fair Barriers to Reach Goals Time post onset;Severity of deficits;Cognitive deficits Barriers/Prognosis Comment -- CHL IP DIET RECOMMENDATION 03/11/2015 SLP Diet Recommendations Dysphagia 3 (Mech soft) solids;Nectar thick liquid Liquid Administration via Cup;Straw Medication Administration Whole meds with puree Compensations Minimize environmental distractions;Slow rate;Small sips/bites;Chin tuck;Effortful swallow Postural Changes Remain semi-upright after after feeds/meals (Comment);Seated upright at 90 degrees   CHL IP OTHER RECOMMENDATIONS 03/11/2015 Recommended Consults -- Oral Care Recommendations Oral care BID Other Recommendations --   CHL IP FOLLOW UP RECOMMENDATIONS 03/11/2015 Follow up Recommendations Skilled Nursing facility   [facility] IP FREQUENCY AND DURATION 01/23/2013 Speech Therapy Frequency (ACUTE ONLY) min 2x/week Treatment Duration 2 weeks      CHL IP ORAL PHASE 03/11/2015 Oral Phase Impaired Oral - Pudding Teaspoon -- Oral - Pudding Cup -- Oral - Honey Teaspoon -- Oral - Honey Cup -- Oral - Nectar Teaspoon -- Oral - Nectar Cup Delayed oral transit Oral - Nectar Straw Delayed oral transit Oral - Thin Teaspoon -- Oral - Thin Cup Delayed oral transit Oral - Thin Straw -- Oral - Puree Delayed oral transit Oral - Mech Soft Delayed oral transit Oral - Regular -- Oral - Multi-Consistency -- Oral -  Pill -- Oral Phase - Comment --  CHL IP PHARYNGEAL PHASE 03/11/2015 Pharyngeal Phase Impaired Pharyngeal- Pudding Teaspoon -- Pharyngeal -- Pharyngeal- Pudding Cup -- Pharyngeal -- Pharyngeal- Honey Teaspoon -- Pharyngeal -- Pharyngeal- Honey Cup -- Pharyngeal -- Pharyngeal- Nectar Teaspoon -- Pharyngeal -- Pharyngeal- Nectar Cup Delayed swallow initiation-pyriform sinuses;Penetration/Aspiration before swallow;Pharyngeal residue - cp segment;Compensatory strategies attempted (with notebox) Pharyngeal Material enters airway, passes BELOW cords and not ejected out despite cough attempt by patient Pharyngeal- Nectar Straw Delayed swallow initiation-pyriform sinuses;Compensatory strategies attempted (with notebox) Pharyngeal -- Pharyngeal- Thin Teaspoon -- Pharyngeal -- Pharyngeal- Thin Cup Delayed swallow initiation-pyriform sinuses;Penetration/Aspiration during swallow;Pharyngeal residue - cp segment Pharyngeal Material enters airway, passes BELOW cords and not ejected out despite cough attempt by patient Pharyngeal- Thin Straw -- Pharyngeal -- Pharyngeal- Puree Delayed swallow initiation-vallecula Pharyngeal -- Pharyngeal- Mechanical Soft Delayed swallow initiation-vallecula Pharyngeal -- Pharyngeal- Regular -- Pharyngeal -- Pharyngeal- Multi-consistency -- Pharyngeal -- Pharyngeal- Pill -- Pharyngeal -- Pharyngeal Comment --  CHL IP CERVICAL ESOPHAGEAL PHASE 03/11/2015 Cervical Esophageal Phase Impaired Pudding Teaspoon -- Pudding Cup -- Honey Teaspoon -- Honey Cup -- Nectar Teaspoon -- Nectar Cup Reduced cricopharyngeal relaxation Nectar Straw Reduced cricopharyngeal relaxation Thin Teaspoon -- Thin Cup Reduced cricopharyngeal relaxation Thin Straw -- Puree Reduced cricopharyngeal relaxation Mechanical Soft Reduced cricopharyngeal relaxation Regular -- Multi-consistency -- Pill --  Cervical Esophageal Comment -- Germain Osgood, M.A. CCC-SLP [phone] Germain Osgood 03/11/2015, 1:59 PM                 Assessment/Plan   ICD-9-CM ICD-10-CM   1. Physical deconditioning - improving 799.3 R53.81   2. Protein-calorie malnutrition, severe ([place])  stable 262 E43   3. COPD bronchitis - stable 491.20 J44.9   4. Alcohol abuse - stable 305.00 F10.10   5. S/P TAVR (transcatheter aortic valve replacement) - stable V43.3 Z95.4   6. Benign essential HTN - stable 401.1 I10   7. Anxiety state - stable 300.00 F41.1     Patient is being discharged with home health services:  PT/OT/ST  Patient is being discharged with the following durable medical equipment:  None  Patient has been advised to f/u with their PCP in 1-2 weeks to bring them up to date on their rehab stay.  They were provided with a 30 day supply of scripts for prescription medications and refills must be obtained from their PCP.  TIME SPENT (MINUTES): Odell. Perlie Gold  [facility] [address] [place], [place] [address] [phone] Cell (Monday-Friday 8 AM - 5 PM) [phone] After 5 PM and follow prompts

## 2015-03-23 DIAGNOSIS — F1010 Alcohol abuse, uncomplicated: Secondary | ICD-10-CM

## 2015-03-23 DIAGNOSIS — E43 Unspecified severe protein-calorie malnutrition: Secondary | ICD-10-CM

## 2015-03-23 DIAGNOSIS — J449 Chronic obstructive pulmonary disease, unspecified: Secondary | ICD-10-CM

## 2015-03-23 DIAGNOSIS — R1310 Dysphagia, unspecified: Secondary | ICD-10-CM | POA: Diagnosis not present

## 2015-04-01 ENCOUNTER — Other Ambulatory Visit: Payer: Self-pay | Admitting: Cardiology

## 2015-04-01 DIAGNOSIS — I714 Abdominal aortic aneurysm, without rupture, unspecified: Secondary | ICD-10-CM

## 2015-04-06 ENCOUNTER — Other Ambulatory Visit (INDEPENDENT_AMBULATORY_CARE_PROVIDER_SITE_OTHER): Payer: Medicare Other

## 2015-04-06 ENCOUNTER — Ambulatory Visit (INDEPENDENT_AMBULATORY_CARE_PROVIDER_SITE_OTHER): Payer: Medicare Other | Admitting: Family

## 2015-04-06 ENCOUNTER — Encounter: Payer: Self-pay | Admitting: Family

## 2015-04-06 VITALS — BP 112/54 | HR 62 | Temp 97.4°F | Resp 18 | Ht 72.0 in | Wt 152.4 lb

## 2015-04-06 DIAGNOSIS — E43 Unspecified severe protein-calorie malnutrition: Secondary | ICD-10-CM | POA: Diagnosis not present

## 2015-04-06 DIAGNOSIS — I10 Essential (primary) hypertension: Secondary | ICD-10-CM

## 2015-04-06 DIAGNOSIS — F1010 Alcohol abuse, uncomplicated: Secondary | ICD-10-CM | POA: Diagnosis not present

## 2015-04-06 DIAGNOSIS — Z954 Presence of other heart-valve replacement: Secondary | ICD-10-CM

## 2015-04-06 DIAGNOSIS — R5381 Other malaise: Secondary | ICD-10-CM

## 2015-04-06 DIAGNOSIS — Z952 Presence of prosthetic heart valve: Secondary | ICD-10-CM

## 2015-04-06 DIAGNOSIS — R110 Nausea: Secondary | ICD-10-CM

## 2015-04-06 LAB — BASIC METABOLIC PANEL
BUN: 20 mg/dL (ref 6–23)
CALCIUM: 9.1 mg/dL (ref 8.4–10.5)
CO2: 32 meq/L (ref 19–32)
CREATININE: 1.13 mg/dL (ref 0.40–1.50)
Chloride: 101 mEq/L (ref 96–112)
GFR: 65.37 mL/min (ref >60.00)
Glucose, Bld: 106 mg/dL — ABNORMAL HIGH (ref 70–99)
Potassium: 4.9 mEq/L (ref 3.5–5.1)
Sodium: 139 mEq/L (ref 135–145)

## 2015-04-06 LAB — HEPATIC FUNCTION PANEL
ALT: 17 U/L (ref 0–53)
AST: 29 U/L (ref 0–37)
Albumin: 3.6 g/dL (ref 3.5–5.2)
Alkaline Phosphatase: 116 U/L (ref 39–117)
BILIRUBIN DIRECT: 0.2 mg/dL (ref 0.0–0.3)
BILIRUBIN TOTAL: 0.6 mg/dL (ref 0.2–1.2)
TOTAL PROTEIN: 7.4 g/dL (ref 6.0–8.3)

## 2015-04-06 LAB — CBC
HCT: 43.4 % (ref 39.0–52.0)
Hemoglobin: 14.2 g/dL (ref 13.0–17.0)
MCHC: 32.7 g/dL (ref 30.0–36.0)
MCV: 97.9 fl (ref 78.0–100.0)
PLATELETS: 140 10*3/uL — AB (ref 150.0–400.0)
RBC: 4.44 Mil/uL (ref 4.22–5.81)
RDW: 13.2 % (ref 11.5–15.5)
WBC: 5.3 10*3/uL (ref 4.0–10.5)

## 2015-04-06 MED ORDER — ONDANSETRON 4 MG PO TBDP: 4.0000 mg | ORAL_TABLET | Freq: Three times a day (TID) | ORAL | Status: DC | PRN

## 2015-04-06 NOTE — Progress Notes (Signed)
Pre visit review using our clinic review tool, if applicable. No additional management support is needed unless otherwise documented below in the visit note. 

## 2015-04-06 NOTE — Assessment & Plan Note (Signed)
Indicates that he has remained sober since his hospitalization and discharge from the rehabilitation facility. Obtain basic metabolic panel. Encouraged continued abstinence from alcohol.

## 2015-04-06 NOTE — Assessment & Plan Note (Signed)
Previously stable and has been increasing small increase shortness of breath within the last several weeks which is most likely a combination of physical deconditioning and possible advancement of heart disease. Follow-up with cardiology for further assessment. Does have abdominal aortic aneurysm scan at the end of January 2017.

## 2015-04-06 NOTE — Assessment & Plan Note (Signed)
Nausea appears to be resolving with waxing and waning episodes. Start ondansetron as needed for nausea. Was previously on promethazine, however given previous history of alcohol intoxication concern for increased risk of falls with promethazine. Encouraged frequent small meals and including nutrition supplements as tolerated.

## 2015-04-06 NOTE — Assessment & Plan Note (Signed)
Hypertension is well controlled with current regimen and below goal 140/90. Blood pressure slightly low today. Continue to monitor through office visits. Follow-up with cardiology as previously indicated. Continue current dosage of metoprolol and furosemide.

## 2015-04-06 NOTE — Progress Notes (Signed)
Subjective:    Patient ID: Samuel [name], male    DOB: 1927/02/18, 82 y.o.   MRN: [id number]  Chief Complaint  Patient presents with  . Establish Care    states he feels weak and has a poor appetite, just got out of rehab    HPI:  Samuel [name] is a 82 y.o. 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 and presents today for an office visit to establish care.   Recently evaluated in the ED and admitted to the hospital for nausea with a 2-3 week onset. Blood work was negative for significant abnormalities and showed evidence of dehydration. He reports having drank approximately 3 - 40 oz beers prior to arrival at the ED. He was rehydrated with 250 ml bolus which improved his nausea. There was concern for alcoholic hepatitis, so he was admitted for dehydration and to explore causes of his nausea. The dehydration was resolved with fluids. He did continue to experience some mild nausea but was able to tolerate a regular diet. He was placed on CIWA while in the hospital and had no significant signs of withdrawal. His multiple falls were attributed to alcohol and was recommened to SNF by PT. There was also concern for EKG abnormalities with no elevation in Troponins. He did not have any chest pain during the hospitalization. He was recently discharged from SNF with physical deconditioning, protein-calorie malnutrition, alcohol abuse and hypertension. All hospital  records and labs were reviewed in detail.  Since leaving the hospital and rehabilitation facility the nausea has improved significantly. Reports that his appetite is poor and cannot eat enough. Tries to eat 3 small meals per day as he can tolerate. He does have good access to food. Does not have difficulty preparing food. Does experience shortness of breath on occasion with eating on occasion. Modifying factors include Xanax when he has panic attacks. He was scheduled to follow up with cardiology, but has not done so. States that he has remained sober since leaving the hospital and nursing facility.    No Known Allergies   Outpatient Prescriptions Prior to Visit  Medication Sig Dispense Refill  . ALPRAZolam (XANAX) 0.5 MG tablet Take 1 tablet (0.5 mg total) by mouth 2 (two) times daily as needed for anxiety. 10 tablet 0  . aspirin EC 325 MG tablet Take 1 tablet (325 mg total) by mouth every other day. 30 tablet 0  . Bioflavonoid Products (ESTER C PO) Take 1,000 mg by mouth daily.    . Cholecalciferol (VITAMIN D-3) 5000 UNITS TABS Take 1 capsule by mouth.    . furosemide (LASIX) 20 MG tablet Take 10 mg by mouth daily.    . metoprolol tartrate (LOPRESSOR) 25 MG tablet Take 12.5 mg by mouth daily.    . Multiple Vitamin (MULTIVITAMIN WITH MINERALS) TABS Take 1 tablet by mouth daily. 30 tablet 0  . ondansetron (ZOFRAN) 4 MG tablet Take 1 tablet (4 mg total) by mouth every 6 (six) hours as needed for nausea. 20 tablet 0  . POTASSIUM GLUCONATE ER PO Take by mouth daily. OTC    . SELENIUM PO Take  1 tablet by mouth daily.    . traMADol (ULTRAM) 50 MG tablet Take 1 tablet (50 mg total) by mouth 2 (two) times daily as needed for moderate pain. 10 tablet 0  . vitamin B-12 (CYANOCOBALAMIN) 100 MCG tablet Take 50 mcg by mouth daily.      Marland Kitchen 0.9 %  sodium chloride infusion      No facility-administered medications prior to visit.     Past Medical History  Diagnosis Date  . Depression   . COPD (chronic  obstructive pulmonary disease) (HCC)   . Aortic stenosis     Echo 7/14:  Severe LVH, EF 55-60%, severe AS, mean gradient 78 mmHg, MAC, mild LAE  . Essential hypertension, benign   . Decreased libido   . DIZZINESS, CHRONIC   . Alcohol abuse   . Shortness of breath 10/24/11    "all the time"  . Arthritis     "in the low vertebra"  . Chronic lower back pain   . Scoliosis/kyphoscoliosis 10/28/2011  . Complication of anesthesia     NO NECK LINES ON RIGHT FOR SURGERY  . Heart murmur   . GERD (gastroesophageal reflux disease)     occ  . Abdominal aortic aneurysm (HCC) 10/29/2011    3.7cm AAA by CT scan with unusual appearance suggestive of penetrating atherosclerotic ulcer or localized dissection  . CHF (congestive heart failure) (HCC)   . 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 Perivalvular leak of prosthetic heart valve     echo (02/21/13): Mild LVH, EF 55-60%, aortic valve with mild to moderate perivalvular leak, AI, mean aortic valve gradient 6 mm of mercury, MAC.     Past Surgical History  Procedure Laterality Date  . No past surgeries    . Transcatheter aortic valve replacement, transaortic N/A 01/21/2013    Procedure: TRANSCATHETER AORTIC VALVE REPLACEMENT, TRANSAORTIC;  Surgeon: Purcell Nails, MD;  Location: [facility] OR;  Service: Open Heart Surgery;  Laterality: N/A;  . Intraoperative transesophageal echocardiogram N/A 01/21/2013    Procedure: INTRAOPERATIVE TRANSESOPHAGEAL ECHOCARDIOGRAM;  Surgeon: Purcell Nails, MD;  Location: [facility] OR;  Service: Open Heart Surgery;  Laterality: N/A;  . Thoracotomy Right 01/21/2013    Procedure: MINI/LIMITED THORACOTOMY;  Surgeon: Purcell Nails, MD;  Location: [facility] OR;  Service: Open Heart Surgery;  Laterality: Right;  . Left and right heart catheterization with coronary angiogram N/A 10/27/2011     Procedure: LEFT AND RIGHT HEART CATHETERIZATION WITH CORONARY ANGIOGRAM;  Surgeon: Herby Abraham, MD;  Location: [facility] CATH LAB;  Service: Cardiovascular;  Laterality: N/A;     Family History  Problem Relation Age of Onset  . Other      no known medical hx of heart disease  . Heart attack Father 35  . Stroke Father   . Heart failure Mother 69  . Thyroid disease Mother   . Heart attack Brother 51  . Diabetes Brother   . Heart attack Brother 42  . Diabetes Sister   . Hypertension Sister      Social History   Social History  . Marital Status: Widowed    Spouse Name: N/A  . Number of Children: 0  . Years of Education: 12   Occupational History  . Retired    Social History Main Topics  . Smoking status: Former Smoker -- 2.5 years    Types:  Cigarettes    Quit date: 07/02/1952  . Smokeless tobacco: Never Used     Comment: wine x2 q nite in last week 01/17/13  . Alcohol Use: 37.8 oz/week    42 Glasses of wine, 21 Cans of beer per week     Comment: 10/24/11 "drink q day; quite a bit"; Started 1974 (mostly hard liquor)  . Drug Use: No  . Sexual Activity: No   Other Topics Concern  . Not on file   Social History Narrative   Retired worked for [employer] in [employer]   Divorced: 1st and 2nd died and 3rd was separated   Never Smoked   Alcohol use -yes   Lives alone;    Review of Systems  Constitutional: Negative for fever and chills.  Respiratory: Positive for shortness of breath. Negative for cough and chest tightness.   Cardiovascular: Negative for chest pain, palpitations and leg swelling.  Gastrointestinal: Positive for nausea. Negative for vomiting, abdominal pain, constipation and abdominal distention.      Objective:    BP 112/54 mmHg  Pulse 62  Temp(Src) 97.4 F (36.3 C) (Oral)  Resp 18  Ht 6' (1.829 m)  Wt 152 lb 6.4 oz (69.128 kg)  BMI 20.66 kg/m2  SpO2 97% Nursing note and vital signs reviewed.  Physical Exam  Constitutional: He is  oriented to person, place, and time. He appears well-developed and well-nourished. No distress.  Cardiovascular: Normal rate, regular rhythm, normal heart sounds and intact distal pulses.   Pulmonary/Chest: Effort normal and breath sounds normal.  Neurological: He is alert and oriented to person, place, and time.  Skin: Skin is warm and dry.  Psychiatric: He has a normal mood and affect. His behavior is normal. Judgment and thought content normal.       Assessment & Plan:   Problem List Items Addressed This Visit      Cardiovascular and Mediastinum   Essential hypertension, benign    Hypertension is well controlled with current regimen and below goal 140/90. Blood pressure slightly low today. Continue to monitor through office visits. Follow-up with cardiology as previously indicated. Continue current dosage of metoprolol and furosemide.      Relevant Orders   Basic Metabolic Panel (BMET)   Hepatic function panel   CBC     Other   Alcohol abuse    Indicates that he has remained sober since his hospitalization and discharge from the rehabilitation facility. Obtain basic metabolic panel. Encouraged continued abstinence from alcohol.      Relevant Orders   Hepatic function panel   CBC   Physical deconditioning - Primary    Shortness of breath most likely related to physical deconditioning and decreased nutritional intake. He continues to work with home health physical and occupational therapy to increase his physical strength, endurance, imbalance. Currently walks with a walker and is adequately able to complete his activities of daily living with minimal assistance.      Relevant Orders   Basic Metabolic Panel (BMET)   Hepatic function panel   CBC   S/P TAVR (transcatheter aortic valve replacement)    Previously stable and has been increasing small increase shortness of breath within the last several weeks which is most likely a combination of physical deconditioning and  possible advancement of heart disease. Follow-up with cardiology for further assessment. Does have abdominal aortic aneurysm scan at the end of January 2017.      Relevant Orders   Basic Metabolic Panel (BMET)   Protein-calorie  malnutrition, severe (HCC)    Protein calorie malnutrition most likely related to previous alcohol use. Expresses decreased appetite with no significant abdominal pain or constipation. Encouraged frequent small meals with the addition of a nutritional supplement between meals as tolerated. Concern for possible heart related decrease in appetite as he uses a significant amount of calories and energy to eat.      Relevant Orders   Basic Metabolic Panel (BMET)   Hepatic function panel   CBC   Nausea without vomiting    Nausea appears to be resolving with waxing and waning episodes. Start ondansetron as needed for nausea. Was previously on promethazine, however given previous history of alcohol intoxication concern for increased risk of falls with promethazine. Encouraged frequent small meals and including nutrition supplements as tolerated.      Relevant Medications   ondansetron (ZOFRAN-ODT) 4 MG disintegrating tablet   Other Relevant Orders   Basic Metabolic Panel (BMET)

## 2015-04-06 NOTE — Patient Instructions (Signed)
Thank you for choosing [facility].  Summary/Instructions:  Your prescription(s) have been submitted to your pharmacy or been printed and provided for you. Please take as directed and contact our office if you believe you are having problem(s) with the medication(s) or have any questions.  Please follow up with cardiology as previously scheduled.   Please stop by the lab on the basement level of the building for your blood work. Your results will be released to MyChart (or called to you) after review, usually within 72 hours after test completion. If any changes need to be made, you will be notified at that same time.  If your symptoms worsen or fail to improve, please contact our office for further instruction, or in case of emergency go directly to the emergency room at the closest medical facility.   Please eat frequent, small meals. Try to incorporate Boost/Breeze or other nutritional shakes into your meal plans.

## 2015-04-06 NOTE — Assessment & Plan Note (Signed)
Shortness of breath most likely related to physical deconditioning and decreased nutritional intake. He continues to work with home health physical and occupational therapy to increase his physical strength, endurance, imbalance. Currently walks with a walker and is adequately able to complete his activities of daily living with minimal assistance.

## 2015-04-06 NOTE — Assessment & Plan Note (Signed)
Protein calorie malnutrition most likely related to previous alcohol use. Expresses decreased appetite with no significant abdominal pain or constipation. Encouraged frequent small meals with the addition of a nutritional supplement between meals as tolerated. Concern for possible heart related decrease in appetite as he uses a significant amount of calories and energy to eat.

## 2015-04-07 ENCOUNTER — Telehealth: Payer: Self-pay | Admitting: Family

## 2015-04-07 NOTE — Telephone Encounter (Signed)
Please inform patient that his blood work looks good with his liver function, kidney function and electrolytes being within the normal ranges. His white/red blood cells are also improving from his previous blood work. Therefore please have him follow up in a month to determine how his weight is doing.

## 2015-04-07 NOTE — Telephone Encounter (Signed)
Revonda StandardAllison from [facility] called requesting orders for speech therapy. She says she will be checking on him every other week for a month for his swallowing and safety. She did mention that she is concerned that he doesn't understand his diagnosis yesterday and if you feel the need she can put an order in for nursing for education. Please call her at [phone]

## 2015-04-07 NOTE — Telephone Encounter (Signed)
Ok for [employer] [facility] and nursing education.

## 2015-04-08 ENCOUNTER — Telehealth: Payer: Self-pay

## 2015-04-08 NOTE — Telephone Encounter (Signed)
PA for Ondansetron received and key submitted to Cover My Meds.   KEY: [id number]

## 2015-04-08 NOTE — Telephone Encounter (Signed)
Allison is aware 

## 2015-04-12 NOTE — Telephone Encounter (Signed)
PA approved.   Can you contact pt and pharmacy pls?

## 2015-04-13 NOTE — Telephone Encounter (Signed)
Pt and pharmacy aware

## 2015-04-14 ENCOUNTER — Telehealth: Payer: Self-pay

## 2015-04-14 NOTE — Telephone Encounter (Signed)
PA initiated via covermymeds. Key for PA is [id number]

## 2015-04-15 ENCOUNTER — Telehealth: Payer: Self-pay | Admitting: Family

## 2015-04-15 NOTE — Telephone Encounter (Signed)
Is requesting orders for teaching nutrition and check on patient weekly to make sure he is taking medications correctly,

## 2015-04-16 ENCOUNTER — Telehealth: Payer: Self-pay

## 2015-04-16 NOTE — Telephone Encounter (Signed)
Called back and gave the verbal ok.

## 2015-04-16 NOTE — Telephone Encounter (Signed)
Renewed PA for ondansetron. Key for PA is [id number]

## 2015-04-20 ENCOUNTER — Other Ambulatory Visit: Payer: Self-pay | Admitting: Internal Medicine

## 2015-04-21 ENCOUNTER — Other Ambulatory Visit: Payer: Self-pay | Admitting: Family

## 2015-04-21 NOTE — Telephone Encounter (Signed)
Please advise on xanax and tramadol rx request, thanks

## 2015-04-23 ENCOUNTER — Other Ambulatory Visit: Payer: Self-pay | Admitting: Internal Medicine

## 2015-04-23 NOTE — Telephone Encounter (Signed)
PA has already been approved:   

## 2015-04-27 ENCOUNTER — Inpatient Hospital Stay (HOSPITAL_COMMUNITY): Admission: RE | Admit: 2015-04-27 | Payer: Medicare Other | Source: Ambulatory Visit

## 2015-05-06 ENCOUNTER — Other Ambulatory Visit (HOSPITAL_COMMUNITY): Payer: Medicare Other

## 2015-05-10 ENCOUNTER — Other Ambulatory Visit: Payer: Self-pay | Admitting: Family

## 2015-05-11 NOTE — Telephone Encounter (Signed)
Pt called to check up on this request.  °

## 2015-05-19 ENCOUNTER — Other Ambulatory Visit: Payer: Self-pay | Admitting: Internal Medicine

## 2015-05-25 ENCOUNTER — Inpatient Hospital Stay (HOSPITAL_COMMUNITY): Admission: RE | Admit: 2015-05-25 | Payer: Medicare Other | Source: Ambulatory Visit

## 2015-06-08 ENCOUNTER — Other Ambulatory Visit: Payer: Self-pay | Admitting: Family

## 2015-06-08 NOTE — Telephone Encounter (Signed)
Last refill of both medications was 04/21/15

## 2015-06-09 ENCOUNTER — Other Ambulatory Visit: Payer: Self-pay | Admitting: Family

## 2015-06-11 ENCOUNTER — Ambulatory Visit (HOSPITAL_COMMUNITY)
Admission: RE | Admit: 2015-06-11 | Discharge: 2015-06-11 | Disposition: A | Discharge status: Home / Self Care | Payer: Medicare Other | Source: Ambulatory Visit | Attending: Cardiovascular Disease | Admitting: Cardiovascular Disease

## 2015-06-11 DIAGNOSIS — I700 Atherosclerosis of aorta: Secondary | ICD-10-CM | POA: Diagnosis not present

## 2015-06-11 DIAGNOSIS — I708 Atherosclerosis of other arteries: Secondary | ICD-10-CM | POA: Insufficient documentation

## 2015-06-11 DIAGNOSIS — I509 Heart failure, unspecified: Secondary | ICD-10-CM | POA: Diagnosis not present

## 2015-06-11 DIAGNOSIS — I714 Abdominal aortic aneurysm, without rupture, unspecified: Secondary | ICD-10-CM

## 2015-06-11 DIAGNOSIS — I110 Hypertensive heart disease with heart failure: Secondary | ICD-10-CM | POA: Diagnosis not present

## 2015-06-24 ENCOUNTER — Telehealth: Payer: Self-pay | Admitting: Family

## 2015-06-24 NOTE — Telephone Encounter (Signed)
Patient is requesting metoprolol succinate to be [facility] [phone].  Patient states he only has two pills left.  Tried to get pharmacy to transfer but this one did not get transferred.

## 2015-06-25 MED ORDER — METOPROLOL TARTRATE 25 MG PO TABS: 12.5000 mg | ORAL_TABLET | Freq: Every day | ORAL | Status: DC

## 2015-06-25 NOTE — Telephone Encounter (Signed)
Rx sent 

## 2015-06-25 NOTE — Telephone Encounter (Signed)
Pt called in again. He wants this called in today.

## 2015-07-08 ENCOUNTER — Ambulatory Visit: Payer: Medicare Other | Admitting: Cardiology

## 2015-07-15 ENCOUNTER — Other Ambulatory Visit: Payer: Self-pay | Admitting: Family

## 2015-08-09 ENCOUNTER — Other Ambulatory Visit: Payer: Self-pay

## 2015-08-09 MED ORDER — FUROSEMIDE 20 MG PO TABS: 10.0000 mg | ORAL_TABLET | Freq: Every day | ORAL | Status: DC

## 2015-08-18 ENCOUNTER — Other Ambulatory Visit: Payer: Self-pay | Admitting: Family

## 2015-08-18 NOTE — Telephone Encounter (Signed)
Faxed script back to [facility]...Raechel Chute/lmb

## 2015-08-23 ENCOUNTER — Other Ambulatory Visit: Payer: Self-pay | Admitting: Family

## 2015-08-23 NOTE — Progress Notes (Signed)
HPI: FU AVR. LHC 7/13 demonstrated luminal irregs but no significant CAD. Echo 7/14 demonstrated normal LVF and severe AS with mean gradient of 78 mmHg. He was referred to Dr. Cornelius Moraswen for aortic valve surgery. Note, patient has severe tortuosity of his aorta and small infrarenal abdominal aortic aneurysm. Underwent transaortic valve replacement via transaortic approach via right anterior minithoracotomy 10/14. Echocardiogram October 2015 showed normal LV systolic function, grade 1 diastolic dysfunction, aortic valve replacement with mild to moderate paravalvular leak. Abdominal ultrasound March 2017 showed abdominal aortic aneurysm measuring 4 x 4.3 cm. CTA recommended. Since last seen, He has weakness and mild dyspnea on exertion. No orthopnea or PND. He does have worsening pedal edema. No chest pain or syncope.    Current Outpatient Prescriptions  Medication Sig Dispense Refill  . ALPRAZolam (XANAX) 0.5 MG tablet take 1 tablet BY MOUTH TWICE DAILY AS NEEDED for anxiety 60 tablet 1  . aspirin EC 325 MG tablet Take 1 tablet (325 mg total) by mouth every other day. 30 tablet 0  . Bioflavonoid Products (ESTER C PO) Take 1,000 mg by mouth daily.    . Cholecalciferol (VITAMIN D-3) 5000 UNITS TABS Take 1 capsule by mouth.    . furosemide (LASIX) 20 MG tablet Take 0.5 tablets (10 mg total) by mouth daily. (Patient taking differently: TAKE 30 MG (1.5 MG) DAILY.) 30 tablet 0  . metoprolol succinate (TOPROL-XL) 25 MG 24 hr tablet TAKE 1/2 TABLET BY MOUTH DAILY FOR BLOOD PRESSURE 30 tablet 0  . metoprolol tartrate (LOPRESSOR) 25 MG tablet Take 0.5 tablets (12.5 mg total) by mouth daily. 90 tablet 2  . Multiple Vitamin (MULTIVITAMIN WITH MINERALS) TABS Take 1 tablet by mouth daily. 30 tablet 0  . ondansetron (ZOFRAN) 4 MG tablet Take 1 tablet (4 mg total) by mouth every 6 (six) hours as needed for nausea. 20 tablet 0  . ondansetron (ZOFRAN-ODT) 4 MG disintegrating tablet Take 1 tablet (4 mg total) by  mouth every 8 (eight) hours as needed for nausea or vomiting. 20 tablet 0  . POTASSIUM GLUCONATE ER PO Take by mouth daily. OTC    . SELENIUM PO Take 1 tablet by mouth daily.    . traMADol (ULTRAM) 50 MG tablet take 1 tablet BY MOUTH TWICE DAILY AS NEEDED for moderate to severe pain 60 tablet 0  . vitamin B-12 (CYANOCOBALAMIN) 100 MCG tablet Take 50 mcg by mouth daily.       No current facility-administered medications for this visit.     Past Medical History  Diagnosis Date  . Depression   . COPD (chronic obstructive pulmonary disease) (HCC)   . Aortic stenosis     Echo 7/14:  Severe LVH, EF 55-60%, severe AS, mean gradient 78 mmHg, MAC, mild LAE  . Essential hypertension, benign   . Decreased libido   . DIZZINESS, CHRONIC   . Alcohol abuse   . Shortness of breath 10/24/11    "all the time"  . Arthritis     "in the low vertebra"  . Chronic lower back pain   . Scoliosis/kyphoscoliosis 10/28/2011  . Complication of anesthesia     NO NECK LINES ON RIGHT FOR SURGERY  . Heart murmur   . GERD (gastroesophageal reflux disease)     occ  . Abdominal aortic aneurysm (HCC) 10/29/2011    3.7cm AAA by CT scan with unusual appearance suggestive of penetrating atherosclerotic ulcer or localized dissection  . CHF (congestive heart failure) (HCC)   .  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 Perivalvular leak of prosthetic heart valve     echo (02/21/13): Mild LVH, EF 55-60%, aortic valve with mild to moderate perivalvular leak, AI, mean aortic valve gradient 6 mm of mercury, MAC.    Past Surgical History  Procedure Laterality Date  . No past surgeries    . Transcatheter aortic valve replacement, transaortic N/A 01/21/2013    Procedure: TRANSCATHETER AORTIC VALVE REPLACEMENT, TRANSAORTIC;  Surgeon: Purcell Nails, MD;  Location: [facility] OR;   Service: Open Heart Surgery;  Laterality: N/A;  . Intraoperative transesophageal echocardiogram N/A 01/21/2013    Procedure: INTRAOPERATIVE TRANSESOPHAGEAL ECHOCARDIOGRAM;  Surgeon: Purcell Nails, MD;  Location: [facility] OR;  Service: Open Heart Surgery;  Laterality: N/A;  . Thoracotomy Right 01/21/2013    Procedure: MINI/LIMITED THORACOTOMY;  Surgeon: Purcell Nails, MD;  Location[facility] OR;  Service: Open Heart Surgery;  Laterality: Right;  . Left and right heart catheterization with coronary angiogram N/A 10/27/2011    Procedure: LEFT AND RIGHT HEART CATHETERIZATION WITH CORONARY ANGIOGRAM;  Surgeon: Herby Abraham, MD;  Location: [facility] CATH LAB;  Service: Cardiovascular;  Laterality: N/A;    Social History   Social History  . Marital Status: Widowed    Spouse Name: N/A  . Number of Children: 0  . Years of Education: 12   Occupational History  . Retired    Social History Main Topics  . Smoking status: Former Smoker -- 2.5 years    Types: Cigarettes    Quit date: 07/02/1952  . Smokeless tobacco: Never Used     Comment: wine x2 q nite in last week 01/17/13  . Alcohol Use: 37.8 oz/week    42 Glasses of wine, 21 Cans of beer per week     Comment: 10/24/11 "drink q day; quite a bit"; Started 1974 (mostly hard liquor)  . Drug Use: No  . Sexual Activity: No   Other Topics Concern  . Not on file   Social History Narrative   Retired worked for [employer] in [employer]   Divorced: 1st and 2nd died and 3rd was separated   Never Smoked   Alcohol use -yes   Lives alone;    Family History  Problem Relation Age of Onset  . Other      no known medical hx of heart disease  . Heart attack Father 75  . Stroke Father   . Heart failure Mother 6  . Thyroid disease Mother   . Heart attack Brother 51  . Diabetes Brother   . Heart attack Brother 33  . Diabetes Sister   . Hypertension Sister     ROS: weakness but no fevers or chills, productive cough, hemoptysis, dysphasia,  odynophagia, melena, hematochezia, dysuria, hematuria, rash, seizure activity, orthopnea, PND, claudication. Remaining systems are negative.  Physical Exam: Well-developed well-nourished in no acute distress.  Skin is warm and dry.  HEENT is normal.  Neck is supple.  Chest is clear to auscultation with normal expansion.  Cardiovascular exam is regular rate and rhythm. 1/6 systolic and 3/6 diastolic murmur left sternal border. Abdominal exam nontender or distended. No masses palpated. Extremities show 1+ edema. neuro grossly intact  ECG Sinus rhythm at a rate of 66. No ST changes. Probable precordial lead reversal.

## 2015-08-24 ENCOUNTER — Encounter: Payer: Self-pay | Admitting: Cardiology

## 2015-08-24 ENCOUNTER — Ambulatory Visit (INDEPENDENT_AMBULATORY_CARE_PROVIDER_SITE_OTHER): Payer: Medicare Other | Admitting: Cardiology

## 2015-08-24 DIAGNOSIS — Z954 Presence of other heart-valve replacement: Secondary | ICD-10-CM | POA: Diagnosis not present

## 2015-08-24 DIAGNOSIS — Z952 Presence of prosthetic heart valve: Secondary | ICD-10-CM

## 2015-08-24 MED ORDER — FUROSEMIDE 20 MG PO TABS: 40.0000 mg | ORAL_TABLET | Freq: Every day | ORAL | Status: DC

## 2015-08-24 NOTE — Patient Instructions (Signed)
Medication Instructions:   INCREASE FUROSEMIDE TO 40 MG ONCE DAILY= 2 OF THE 20 MG TABLETS ONCE DAILY  Labwork:  Your physician recommends that you return for lab work in:ONE WEEK  Testing/Procedures:  Your physician has requested that you have an echocardiogram. Echocardiography is a painless test that uses sound waves to create images of your heart. It provides your doctor with information about the size and shape of your heart and how well your heart's chambers and valves are working. This procedure takes approximately one hour. There are no restrictions for this procedure.    Follow-Up:  Your physician recommends that you schedule a follow-up appointment in: 3 MONTHS WITH DR Jens SomRENSHAW

## 2015-08-24 NOTE — Assessment & Plan Note (Signed)
Plan to continue SBE prophylaxis.He is having worsening pedal edema. Increase Lasix to 40 mg daily. Check potassium and renal function in 1 week. Repeat echocardiogram. He does have a history of aortic insufficiency following his valve replacement.

## 2015-08-24 NOTE — Assessment & Plan Note (Signed)
Blood pressure controlled. Continue present medications. 

## 2015-08-24 NOTE — Assessment & Plan Note (Signed)
Plan to check renal function. If stable will proceed with CTA to further assess abdominal aortic aneurysm.

## 2015-08-31 ENCOUNTER — Other Ambulatory Visit: Payer: Medicare Other

## 2015-09-07 ENCOUNTER — Ambulatory Visit (HOSPITAL_COMMUNITY): Payer: Medicare Other | Attending: Cardiology

## 2015-09-07 ENCOUNTER — Other Ambulatory Visit: Payer: Medicare Other

## 2015-09-07 ENCOUNTER — Other Ambulatory Visit (INDEPENDENT_AMBULATORY_CARE_PROVIDER_SITE_OTHER): Payer: Medicare Other

## 2015-09-07 ENCOUNTER — Other Ambulatory Visit: Payer: Self-pay

## 2015-09-07 DIAGNOSIS — I110 Hypertensive heart disease with heart failure: Secondary | ICD-10-CM | POA: Insufficient documentation

## 2015-09-07 DIAGNOSIS — I059 Rheumatic mitral valve disease, unspecified: Secondary | ICD-10-CM | POA: Diagnosis not present

## 2015-09-07 DIAGNOSIS — I509 Heart failure, unspecified: Secondary | ICD-10-CM | POA: Diagnosis not present

## 2015-09-07 DIAGNOSIS — Z954 Presence of other heart-valve replacement: Secondary | ICD-10-CM | POA: Diagnosis not present

## 2015-09-07 DIAGNOSIS — I359 Nonrheumatic aortic valve disorder, unspecified: Secondary | ICD-10-CM | POA: Diagnosis not present

## 2015-09-07 DIAGNOSIS — I10 Essential (primary) hypertension: Secondary | ICD-10-CM

## 2015-09-07 DIAGNOSIS — Z87891 Personal history of nicotine dependence: Secondary | ICD-10-CM | POA: Diagnosis not present

## 2015-09-07 DIAGNOSIS — I351 Nonrheumatic aortic (valve) insufficiency: Secondary | ICD-10-CM | POA: Diagnosis not present

## 2015-09-07 DIAGNOSIS — Z952 Presence of prosthetic heart valve: Secondary | ICD-10-CM

## 2015-09-07 LAB — BASIC METABOLIC PANEL
BUN: 22 mg/dL (ref 7–25)
CHLORIDE: 95 mmol/L — AB (ref 98–110)
CO2: 35 mmol/L — ABNORMAL HIGH (ref 20–31)
Calcium: 9.3 mg/dL (ref 8.6–10.3)
Creat: 1.34 mg/dL — ABNORMAL HIGH (ref 0.70–1.11)
Glucose, Bld: 90 mg/dL (ref 65–99)
POTASSIUM: 4.5 mmol/L (ref 3.5–5.3)
SODIUM: 140 mmol/L (ref 135–146)

## 2015-09-07 LAB — ECHOCARDIOGRAM COMPLETE
AOPV: 0.46 m/s
AOVTI: 45.4 cm
AV Area VTI index: 0.73 cm2/m2
AV Area mean vel: 1.45 cm2
AV area mean vel ind: 0.76 cm2/m2
AVA: 1.4 cm2
AVAREAVTI: 1.31 cm2
AVCELMEANRAT: 0.51
AVG: 7 mmHg
AVPG: 14 mmHg
AVPKVEL: 190 cm/s
CHL CUP AV PEAK INDEX: 0.69
CHL CUP AV VEL: 1.4
CHL CUP MV DEC (S): 539
DOP CAL AO MEAN VELOCITY: 121 cm/s
E decel time: 539 msec
E/e' ratio: 15.94
FS: 28 % (ref 28–44)
IVS/LV PW RATIO, ED: 1.06
LA diam index: 1.62 cm/m2
LASIZE: 31 mm
LAVOLA4C: 58 mL
LEFT ATRIUM END SYS DIAM: 31 mm
LV TDI E'MEDIAL: 4.68
LV e' LATERAL: 6.04 cm/s
LVEEAVG: 15.94
LVEEMED: 15.94
LVOT VTI: 22.4 cm
LVOT area: 2.84 cm2
LVOT peak VTI: 0.49 cm
LVOT peak grad rest: 3 mmHg
LVOT peak vel: 87.4 cm/s
LVOTD: 19 mm
LVOTSV: 64 mL
Lateral S' vel: 12.4 cm/s
MV Peak grad: 4 mmHg
MV pk A vel: 155 m/s
MV pk E vel: 96.3 m/s
P 1/2 time: 492 ms
PW: 10.8 mm — AB (ref 0.6–1.1)
TDI e' lateral: 6.04
Valve area index: 0.73

## 2015-09-09 ENCOUNTER — Telehealth: Payer: Self-pay | Admitting: *Deleted

## 2015-09-09 DIAGNOSIS — N289 Disorder of kidney and ureter, unspecified: Secondary | ICD-10-CM

## 2015-09-09 DIAGNOSIS — I714 Abdominal aortic aneurysm, without rupture, unspecified: Secondary | ICD-10-CM

## 2015-09-09 NOTE — Telephone Encounter (Signed)
-----   Message from Lewayne BuntingBrian S Crenshaw, MD sent at 09/08/2015  3:15 PM EDT ----- Yes, bmet 3 d later Olga MillersBrian Crenshaw  ----- Message -----    From: Freddi Starrebra W Edrei Norgaard, RN    Sent: 09/08/2015  10:11 AM      To: Lewayne BuntingBrian S Crenshaw, MD  Am I okay to do CTA to look at aneurysm based on this renal function?

## 2015-09-09 NOTE — Telephone Encounter (Signed)
Spoke with pt, aware of need for CTA and lab work. [facility]

## 2015-09-14 ENCOUNTER — Other Ambulatory Visit: Payer: Self-pay | Admitting: Family

## 2015-09-14 NOTE — Telephone Encounter (Signed)
Last refill was 08/18/15.

## 2015-09-17 ENCOUNTER — Other Ambulatory Visit: Payer: Self-pay | Admitting: Family

## 2015-09-20 ENCOUNTER — Ambulatory Visit (INDEPENDENT_AMBULATORY_CARE_PROVIDER_SITE_OTHER)
Admission: RE | Admit: 2015-09-20 | Discharge: 2015-09-20 | Disposition: A | Discharge status: Home / Self Care | Payer: Medicare Other | Source: Ambulatory Visit | Attending: Cardiology | Admitting: Cardiology

## 2015-09-20 ENCOUNTER — Telehealth: Payer: Self-pay | Admitting: *Deleted

## 2015-09-20 DIAGNOSIS — I714 Abdominal aortic aneurysm, without rupture, unspecified: Secondary | ICD-10-CM

## 2015-09-20 MED ORDER — IOPAMIDOL (ISOVUE-370) INJECTION 76%
100.0000 mL | Freq: Once | INTRAVENOUS | Status: AC | PRN
  Administered 2015-09-20: 100 mL via INTRAVENOUS

## 2015-09-20 NOTE — Telephone Encounter (Signed)
If no abdominal pain, would not pursue; repeat abdominal ultrasound six months Samuel MillersBrian Regina [name]

## 2015-09-20 NOTE — Telephone Encounter (Signed)
[facility]...Raechel Chute/lmb

## 2015-09-20 NOTE — Telephone Encounter (Signed)
Left message for pt to call.

## 2015-09-20 NOTE — Telephone Encounter (Signed)
Dr Denny LevySCHICK  SPOKE TO DR HARDING IN REGARDS TO PATIENT  CT CSAN  PER DR HARDING ,IT WAS REPORTED FREE AIR IN PATIENT'S PERITONEUM  WILL SEND MESSAGE TO DR CRENSHAW (REVIEW) TO FOLLOW UP WITH NEXT STEP

## 2015-09-23 ENCOUNTER — Other Ambulatory Visit: Payer: Medicare Other

## 2015-09-24 NOTE — Telephone Encounter (Signed)
Spoke with pt, he denies any abdominal pain or problems. He has not been able to come in for repeat blood work because of feeling sick. He reports a low grade fever, no nausea or vomiting. He will come asap for lab work.

## 2015-09-28 ENCOUNTER — Other Ambulatory Visit (INDEPENDENT_AMBULATORY_CARE_PROVIDER_SITE_OTHER): Payer: Medicare Other

## 2015-09-28 DIAGNOSIS — N289 Disorder of kidney and ureter, unspecified: Secondary | ICD-10-CM

## 2015-09-28 LAB — BASIC METABOLIC PANEL
BUN: 24 mg/dL (ref 7–25)
CHLORIDE: 97 mmol/L — AB (ref 98–110)
CO2: 30 mmol/L (ref 20–31)
Calcium: 8.9 mg/dL (ref 8.6–10.3)
Creat: 1.44 mg/dL — ABNORMAL HIGH (ref 0.70–1.11)
Glucose, Bld: 119 mg/dL — ABNORMAL HIGH (ref 65–99)
POTASSIUM: 4.5 mmol/L (ref 3.5–5.3)
Sodium: 138 mmol/L (ref 135–146)

## 2015-09-29 ENCOUNTER — Other Ambulatory Visit: Payer: Self-pay | Admitting: *Deleted

## 2015-09-29 DIAGNOSIS — N289 Disorder of kidney and ureter, unspecified: Secondary | ICD-10-CM

## 2015-10-20 ENCOUNTER — Other Ambulatory Visit: Payer: Self-pay | Admitting: Family

## 2015-10-21 NOTE — Telephone Encounter (Signed)
Faxed script back to [place] [facility].../lmb 

## 2015-10-22 ENCOUNTER — Other Ambulatory Visit (INDEPENDENT_AMBULATORY_CARE_PROVIDER_SITE_OTHER): Payer: Medicare Other | Admitting: *Deleted

## 2015-10-22 DIAGNOSIS — I10 Essential (primary) hypertension: Secondary | ICD-10-CM

## 2015-10-22 LAB — BASIC METABOLIC PANEL
BUN: 19 mg/dL (ref 7–25)
CALCIUM: 9.1 mg/dL (ref 8.6–10.3)
CHLORIDE: 96 mmol/L — AB (ref 98–110)
CO2: 34 mmol/L — AB (ref 20–31)
Creat: 1.2 mg/dL — ABNORMAL HIGH (ref 0.70–1.11)
GLUCOSE: 99 mg/dL (ref 65–99)
POTASSIUM: 4.7 mmol/L (ref 3.5–5.3)
SODIUM: 138 mmol/L (ref 135–146)

## 2015-10-22 NOTE — Addendum Note (Signed)
Addended by: Tonita PhoenixBOWDEN, ROBIN K on: 10/22/2015 09:36 AM   Modules accepted: Orders

## 2015-10-22 NOTE — Addendum Note (Signed)
Addended by: Lisseth Brazeau K on: 10/22/2015 09:36 AM   Modules accepted: Orders  

## 2015-11-18 ENCOUNTER — Other Ambulatory Visit: Payer: Self-pay | Admitting: Family

## 2015-11-26 NOTE — Progress Notes (Signed)
HPI: FU AVR. LHC 7/13 demonstrated luminal irregs but no significant CAD. Echo 7/14 demonstrated normal LVF and severe AS with mean gradient of 78 mmHg. He was referred to Dr. Cornelius Moras for aortic valve surgery. Note, patient has severe tortuosity of his aorta and small infrarenal abdominal aortic aneurysm. Underwent transaortic valve replacement via transaortic approach via right anterior minithoracotomy 10/14. Last echocardiogram June 2017 showed normal LV systolic function, grade 1 diastolic dysfunction, status post aortic valve replacement with mean gradient 7 mmHg, mild to moderate aortic insufficiency.  Abdominal ultrasound March 2017 showed abdominal aortic aneurysm measuring 4 x 4.3 cm. CTA recommended. CTA June 2017 showed enlarging infrarenal abdominal aortic saccular aneurysm measuring 5.1 x 4 cm. Since last seen, Patient denies dyspnea, chest pain, palpitations or syncope. Occasional mild pedal edema.  Current Outpatient Prescriptions  Medication Sig Dispense Refill  . ALPRAZolam (XANAX) 0.5 MG tablet take 1 TABLET BY MOUTH TWICE DAILY AS NEEDED for anxiety 60 tablet 1  . aspirin EC 325 MG tablet Take 1 tablet (325 mg total) by mouth every other day. 30 tablet 0  . Bioflavonoid Products (ESTER C PO) Take 1,000 mg by mouth daily.    . Cholecalciferol (VITAMIN D-3) 5000 UNITS TABS Take 1 capsule by mouth.    . furosemide (LASIX) 20 MG tablet Take 2 tablets (40 mg total) by mouth daily. 60 tablet 6  . metoprolol succinate (TOPROL-XL) 25 MG 24 hr tablet TAKE 1/2 TABLET BY MOUTH DAILY FOR BLOOD PRESSURE 30 tablet 0  . metoprolol tartrate (LOPRESSOR) 25 MG tablet Take 0.5 tablets (12.5 mg total) by mouth daily. 90 tablet 2  . Multiple Vitamin (MULTIVITAMIN WITH MINERALS) TABS Take 1 tablet by mouth daily. 30 tablet 0  . ondansetron (ZOFRAN) 4 MG tablet Take 1 tablet (4 mg total) by mouth every 6 (six) hours as needed for nausea. 20 tablet 0  . ondansetron (ZOFRAN-ODT) 4 MG disintegrating  tablet Take 1 tablet (4 mg total) by mouth every 8 (eight) hours as needed for nausea or vomiting. 20 tablet 0  . POTASSIUM GLUCONATE ER PO Take by mouth daily. OTC    . SELENIUM PO Take 1 tablet by mouth daily.    . traMADol (ULTRAM) 50 MG tablet take 1 tablet BY MOUTH TWICE DAILY AS NEEDED for pain 60 tablet 0  . vitamin B-12 (CYANOCOBALAMIN) 100 MCG tablet Take 50 mcg by mouth daily.       No current facility-administered medications for this visit.      Past Medical History:  Diagnosis Date  . Abdominal aortic aneurysm (HCC) 10/29/2011   3.7cm AAA by CT scan with unusual appearance suggestive of penetrating atherosclerotic ulcer or localized dissection  . Alcohol abuse   . Aortic stenosis    Echo 7/14:  Severe LVH, EF 55-60%, severe AS, mean gradient 78 mmHg, MAC, mild LAE  . Arthritis    "in the low vertebra"  . CHF (congestive heart failure) (HCC)   . Chronic lower back pain   . Complication of anesthesia    NO NECK LINES ON RIGHT FOR SURGERY  . COPD (chronic obstructive pulmonary disease) (HCC)   . Decreased libido   . Depression   . DIZZINESS, CHRONIC   . Essential hypertension, benign   . GERD (gastroesophageal reflux disease)    occ  . Heart murmur   . Perivalvular leak of prosthetic heart valve    echo (02/21/13): Mild LVH, EF 55-60%, aortic valve with mild to moderate perivalvular  leak, AI, mean aortic valve gradient 6 mm of mercury, MAC.  . 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 Scoliosis/kyphoscoliosis 10/28/2011  . Shortness of breath 10/24/11   "all the time"    Past Surgical History:  Procedure Laterality Date  . INTRAOPERATIVE TRANSESOPHAGEAL ECHOCARDIOGRAM N/A 01/21/2013   Procedure: INTRAOPERATIVE TRANSESOPHAGEAL ECHOCARDIOGRAM;  Surgeon: Purcell Nails, MD;  [facility];  Service: Open Heart Surgery;   Laterality: N/A;  . LEFT AND RIGHT HEART CATHETERIZATION WITH CORONARY ANGIOGRAM N/A 10/27/2011   Procedure: LEFT AND RIGHT HEART CATHETERIZATION WITH CORONARY ANGIOGRAM;  Surgeon: Herby Abraham, MD;  Location: [facility] CATH LAB;  Service: Cardiovascular;  Laterality: N/A;  . NO PAST SURGERIES    . THORACOTOMY Right 01/21/2013   Procedure: MINI/LIMITED THORACOTOMY;  Surgeon: Purcell Nails, MD;  Location: [facility] OR;  Service: Open Heart Surgery;  Laterality: Right;  . TRANSCATHETER AORTIC VALVE REPLACEMENT, TRANSAORTIC N/A 01/21/2013   Procedure: TRANSCATHETER AORTIC VALVE REPLACEMENT, TRANSAORTIC;  Surgeon: Purcell Nails, MD;  Location: [facility] OR;  Service: Open Heart Surgery;  Laterality: N/A;    Social History   Social History  . Marital status: Widowed    Spouse name: N/A  . Number of children: 0  . Years of education: 12   Occupational History  . Retired    Social History Main Topics  . Smoking status: Former Smoker    Years: 2.50    Types: Cigarettes    Quit date: 07/02/1952  . Smokeless tobacco: Never Used     Comment: wine x2 q nite in last week 01/17/13  . Alcohol use 37.8 oz/week    42 Glasses of wine, 21 Cans of beer per week     Comment: 10/24/11 "drink q day; quite a bit"; Started 1974 (mostly hard liquor)  . Drug use: No  . Sexual activity: No   Other Topics Concern  . Not on file   Social History Narrative   Retired worked for [employer] in [employer]   Divorced: 1st and 2nd died and 3rd was separated   Never Smoked   Alcohol use -yes   Lives alone;    Family History  Problem Relation Age of Onset  . Heart failure Mother 42  . Thyroid disease Mother   . Heart attack Father 6  . Stroke Father   . Other      no known medical hx of heart disease  . Heart attack Brother 51  . Diabetes Brother   . Heart attack Brother 60  . Diabetes Sister   . Hypertension Sister     ROS: no fevers or chills, productive cough, hemoptysis, dysphasia, odynophagia,  melena, hematochezia, dysuria, hematuria, rash, seizure activity, orthopnea, PND, pedal edema, claudication. Remaining systems are negative.  Physical Exam: Well-developed well-nourished in no acute distress.  Skin is warm and dry.  HEENT is normal.  Neck is supple.  Chest is clear to auscultation with normal expansion.  Cardiovascular exam is regular rate and rhythm.  2/6 diastolic murmur left sternal border. Abdominal exam nontender or distended. No masses palpated. Extremities show trace edema. neuro grossly intact  A/P  1 Status post aortic valve replacement-continue SBE prophylaxis. Mild to moderate aortic insufficiency on most recent echocardiogram.  2 hypertension-continue present blood pressure medications.  3 abdominal aortic aneurysm-we will ask vascular surgery to see. Given his age follow-up imaging  may be all that is indicated at this point although the aneurysm has increased in size fairly rapidly.  Olga MillersBrian Crenshaw, MD

## 2015-11-29 ENCOUNTER — Encounter: Payer: Self-pay | Admitting: Cardiology

## 2015-11-29 ENCOUNTER — Ambulatory Visit (INDEPENDENT_AMBULATORY_CARE_PROVIDER_SITE_OTHER): Payer: Medicare Other | Admitting: Cardiology

## 2015-11-29 VITALS — BP 138/62 | HR 71 | Ht 71.0 in | Wt 150.0 lb

## 2015-11-29 DIAGNOSIS — Z952 Presence of prosthetic heart valve: Secondary | ICD-10-CM

## 2015-11-29 DIAGNOSIS — I714 Abdominal aortic aneurysm, without rupture, unspecified: Secondary | ICD-10-CM

## 2015-11-29 DIAGNOSIS — I10 Essential (primary) hypertension: Secondary | ICD-10-CM

## 2015-11-29 DIAGNOSIS — Z954 Presence of other heart-valve replacement: Secondary | ICD-10-CM

## 2015-11-29 NOTE — Patient Instructions (Signed)
Medication Instructions:  Your physician has recommended you make the following change in your medication:  1- STOP TAKING Metoprolol tartrate (Lopressor).   Follow-Up: You have been referred to VASCULAR SURGERY FOR EVALUATION OF Abdominal Aortic Aneurysm.    Your physician wants you to follow-up in: 6 MONTHS WITH DR CRENSHAW. You will receive a reminder letter in the mail two months in advance. If you don't receive a letter, please call our office to schedule the follow-up appointment.   If you need a refill on your cardiac medications before your next appointment, please call your pharmacy.

## 2015-12-02 ENCOUNTER — Encounter: Payer: Self-pay | Admitting: Vascular Surgery

## 2015-12-03 ENCOUNTER — Ambulatory Visit (INDEPENDENT_AMBULATORY_CARE_PROVIDER_SITE_OTHER): Payer: Medicare Other | Admitting: Vascular Surgery

## 2015-12-03 ENCOUNTER — Telehealth: Payer: Self-pay | Admitting: Cardiology

## 2015-12-03 ENCOUNTER — Encounter: Payer: Self-pay | Admitting: Vascular Surgery

## 2015-12-03 VITALS — BP 136/68 | HR 99 | Temp 98.8°F | Resp 16 | Ht 71.0 in | Wt 150.0 lb

## 2015-12-03 DIAGNOSIS — I714 Abdominal aortic aneurysm, without rupture, unspecified: Secondary | ICD-10-CM

## 2015-12-03 NOTE — Telephone Encounter (Signed)
New message       Request for surgical clearance:  1. What type of surgery is being performed? EVAR  When is this surgery scheduled? Pending clearance 2. Are there any medications that need to be held prior to surgery and how long? Need cardiac clearance only  3. Name of physician performing surgery? Dr Lemar LivingsBrandon Cain  What is your office phone and fax number? Fax [phone]

## 2015-12-03 NOTE — Progress Notes (Signed)
Patient ID: Samuel [name], male   DOB: [birth year], 82 y.o.   MRN: [id number]  Reason for Consult: New Evaluation (AAA)   Referred by Veryl Speakalone, Gregory D, FNP  Subjective:     HPI:  Samuel DragonJoseph A Ozbun is a 82 y.o. male with history of TAVR and known abdominal aortic aneurysm now presents for workup of this. He has recent CT angiogram which demonstrated a 5 cm eccentric saccular aneurysm. He has back pain from scoliosis but no new onset abdominal or back pain. He states that he is actually been quite healthy in his life never having had major surgery without major medical problems. He currently lives alone and is the caretaker of his small dog and is able to ambulate with minimal assistance. He is a former smoker having smoked for approximately 3 years only.  Past Medical History:  Diagnosis Date  . Abdominal aortic aneurysm (HCC) 10/29/2011   3.7cm AAA by CT scan with unusual appearance suggestive of penetrating atherosclerotic ulcer or localized dissection  . Alcohol abuse   . Aortic stenosis    Echo 7/14:  Severe LVH, EF 55-60%, severe AS, mean gradient 78 mmHg, MAC, mild LAE  . Arthritis    "in the low vertebra"  . CHF (congestive heart failure) (HCC)   . Chronic lower back pain   . Complication of anesthesia    NO NECK LINES ON RIGHT FOR SURGERY  . COPD (chronic obstructive pulmonary disease) (HCC)   . Decreased libido   . Depression   . DIZZINESS, CHRONIC   . Essential hypertension, benign   . GERD (gastroesophageal reflux disease)    occ  . Heart murmur   . Perivalvular leak of prosthetic heart valve    echo (02/21/13): Mild LVH, EF 55-60%, aortic valve with mild to moderate perivalvular leak, AI, mean aortic valve gradient 6 mm of mercury, MAC.  . 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 Scoliosis/kyphoscoliosis 10/28/2011  .  Shortness of breath 10/24/11   "all the time"   Family History  Problem Relation Age of Onset  . Heart failure Mother [id number]  . Thyroid disease Mother   . Heart attack Father [id number]  . Stroke Father   . Other      no known medical hx of heart disease  . Heart attack Brother 51  . Diabetes Brother   . Heart attack Brother 7061  . Diabetes Sister   . Hypertension Sister    Past Surgical History:  Procedure Laterality Date  . INTRAOPERATIVE TRANSESOPHAGEAL ECHOCARDIOGRAM N/A 01/21/2013   Procedure: INTRAOPERATIVE TRANSESOPHAGEAL ECHOCARDIOGRAM;  Surgeon: Purcell Nailslarence H Owen, MD;  [facility];  Service: Open Heart Surgery;  Laterality: N/A;  . LEFT AND RIGHT HEART CATHETERIZATION WITH CORONARY ANGIOGRAM N/A 10/27/2011   Procedure: LEFT AND RIGHT HEART CATHETERIZATION WITH CORONARY ANGIOGRAM;  Surgeon: Herby Abrahamhomas D Stuckey, MD;  [facility];  Service: Cardiovascular;  Laterality: N/A;  . NO PAST SURGERIES    . THORACOTOMY Right 01/21/2013   Procedure: MINI/LIMITED THORACOTOMY;  Surgeon: Purcell Nailslarence H Owen, MD;  [facility];  Service: Open Heart Surgery;  Laterality: Right;  . TRANSCATHETER AORTIC VALVE REPLACEMENT, TRANSAORTIC N/A 01/21/2013   Procedure: TRANSCATHETER AORTIC VALVE REPLACEMENT, TRANSAORTIC;  Surgeon: Purcell Nailslarence H Owen, MD;  Location: [facility] OR;  Service: Open Heart Surgery;  Laterality: N/A;    Short Social History:  Social  History  Substance Use Topics  . Smoking status: Former Smoker    Years: 2.50    Types: Cigarettes    Quit date: 07/02/1952  . Smokeless tobacco: Never Used     Comment: wine x2 q nite in last week 01/17/13  . Alcohol use 37.8 oz/week    42 Glasses of wine, 21 Cans of beer per week     Comment: 10/24/11 "drink q day; quite a bit"; Started 1974 (mostly hard liquor)    No Known Allergies  Current Outpatient Prescriptions  Medication Sig Dispense Refill  . ALPRAZolam (XANAX) 0.5 MG tablet take 1 TABLET BY MOUTH TWICE DAILY AS NEEDED for anxiety 60 tablet  1  . Bioflavonoid Products (ESTER C PO) Take 1,000 mg by mouth daily.    . Cholecalciferol (VITAMIN D-3) 5000 UNITS TABS Take 1 capsule by mouth.    . furosemide (LASIX) 20 MG tablet Take 2 tablets (40 mg total) by mouth daily. 60 tablet 6  . metoprolol succinate (TOPROL-XL) 25 MG 24 hr tablet TAKE 1/2 TABLET BY MOUTH DAILY FOR BLOOD PRESSURE 30 tablet 0  . Multiple Vitamin (MULTIVITAMIN WITH MINERALS) TABS Take 1 tablet by mouth daily. 30 tablet 0  . ondansetron (ZOFRAN) 4 MG tablet Take 1 tablet (4 mg total) by mouth every 6 (six) hours as needed for nausea. 20 tablet 0  . ondansetron (ZOFRAN-ODT) 4 MG disintegrating tablet Take 1 tablet (4 mg total) by mouth every 8 (eight) hours as needed for nausea or vomiting. 20 tablet 0  . POTASSIUM GLUCONATE ER PO Take by mouth daily. OTC    . SELENIUM PO Take 1 tablet by mouth daily.    . traMADol (ULTRAM) 50 MG tablet take 1 tablet BY MOUTH TWICE DAILY AS NEEDED for pain 60 tablet 0  . vitamin B-12 (CYANOCOBALAMIN) 100 MCG tablet Take 50 mcg by mouth daily.      Marland Kitchen aspirin EC 325 MG tablet Take 1 tablet (325 mg total) by mouth every other day. (Patient not taking: Reported on 12/03/2015) 30 tablet 0   No current facility-administered medications for this visit.     Review of Systems  Constitutional: Positive for unexpected weight change.  Eyes: Eyes negative.  Respiratory: Respiratory negative.  Cardiovascular: Cardiovascular negative.  GI: Gastrointestinal negative.  Neurological: Negative for dizziness, facial asymmetry, focal weakness, numbness, seizures and speech difficulty.        Objective:  Objective   Vitals:   12/03/15 1001  BP: 136/68  Pulse: 99  Resp: 16  Temp: 98.8 F (37.1 C)  SpO2: 100%  Weight: 150 lb (68 kg)  Height: 5\' 11"  (1.803 m)   Body mass index is 20.92 kg/m.  Physical Exam  Constitutional: He is oriented to person, place, and time. He appears well-nourished. No distress.  HENT:  Head: Normocephalic.    Neck: Normal range of motion.  Cardiovascular: Normal rate.   Pulmonary/Chest: Effort normal.  Abdominal: Soft. He exhibits no mass.  Musculoskeletal: Normal range of motion. He exhibits edema.  Neurological: He is alert and oriented to person, place, and time.    Data: Enlarging infrarenal abdominal aortic saccular aneurysm compared 2013 now measuring 5.1 x 4.0 cm.  Extensive abdominal aortic and visceral atherosclerosis.  Small amounts of scattered pneumoperitoneum throughout the upper abdomen without clear etiology. Difficult to completely exclude perforated viscus or ulcer. If the patient is asymptomatic this could be benign pneumoperitoneum.     Assessment/Plan:  82 year old white male who has history of TAVR followed  by Dr. Jens Som with cardiology. He has an eccentric saccular aneurysm that is now 5 cm in diameter and is very irregular appearing perhaps being a previously ruptured PAU. I discussed with him that this really hasn't unknown rupture risk. He currently does not want to undergo surgery until the end of October and I have agreed that this is likely okay. I will have him see Dr. Jens Som for cardiac clearance for EVAR and he will return in 6 weeks to schedule surgery. I have counseled him on extreme back or abdominal pain requiring emergency workup for rupture of his aneurysm. He demonstrates understanding of this.     Maeola Harman MD [facility]

## 2015-12-03 NOTE — Telephone Encounter (Signed)
Clearance routed via Epic to number provided [phone], Attention: Drinda Buttsnnette.

## 2015-12-03 NOTE — Telephone Encounter (Signed)
Ok for surgery Samuel [name]  

## 2015-12-03 NOTE — Telephone Encounter (Signed)
Clearance routed to MD Crenshaw. 

## 2015-12-10 ENCOUNTER — Encounter: Payer: Medicare Other | Admitting: Vascular Surgery

## 2015-12-13 ENCOUNTER — Encounter: Payer: Self-pay | Admitting: Vascular Surgery

## 2015-12-21 ENCOUNTER — Other Ambulatory Visit: Payer: Self-pay | Admitting: Family

## 2015-12-22 NOTE — Telephone Encounter (Signed)
Last refill of meds were in June and July

## 2015-12-23 NOTE — Telephone Encounter (Signed)
Rx faxed

## 2016-01-11 ENCOUNTER — Encounter: Payer: Self-pay | Admitting: Vascular Surgery

## 2016-01-14 ENCOUNTER — Ambulatory Visit: Payer: Medicare Other | Admitting: Vascular Surgery

## 2016-01-18 ENCOUNTER — Telehealth: Payer: Self-pay | Admitting: Vascular Surgery

## 2016-01-18 NOTE — Telephone Encounter (Signed)
I did attempt to reschedule with this Pt but he was not willing at this time to make his appt

## 2016-01-18 NOTE — Telephone Encounter (Signed)
-----   Message from Glori BickersStephanie N Dehart, RN sent at 01/18/2016 12:19 PM EDT ----- Regarding: [facility] Will you document this, so that it's in his chart, that you attempted to schedule? ----- Message ----- From: Salvadore OxfordBarbara E Graves Sent: 01/18/2016  11:25 AM To: Glori BickersStephanie N Dehart, RN Subject: RE: F/U with [facility]                               I have already spoke to this pt today he stated he has some other things going on and he will call us later to schedule. I tried to make it anyways but he refused.  ----- Message ----- From: Glori BickersStephanie N Dehart, RN Sent: 01/18/2016  11:20 AM To: [facility] Pool [facility]                                   [name] was supposed to F/U with Dr. Randie Heinzain on 01/14/16 but cancelled. He needs to be rescheduled prior to getting him scheduled for EVAR. Will you please contact the patient and reschedule appt?  Thanks, Samuel CornfieldStephanie

## 2016-01-20 ENCOUNTER — Other Ambulatory Visit: Payer: Self-pay | Admitting: Family

## 2016-01-20 NOTE — Telephone Encounter (Signed)
Last refill was 12/23/15 on both medications

## 2016-01-21 NOTE — Telephone Encounter (Signed)
rx faxed

## 2016-01-24 NOTE — Telephone Encounter (Signed)
Pt left msg on triage requesting refills on his Tramadol & alprazolam. Per chart refills was approved & daxed back on Friday 10/20. Called pharmacy spoke w/tech verified if rx was received. Per tech they did not get. Gave verbal for authorization from 10/20.called pt back LMOM [facility]...Raechel Chute/lmb

## 2016-02-16 ENCOUNTER — Other Ambulatory Visit: Payer: Self-pay | Admitting: *Deleted

## 2016-02-16 DIAGNOSIS — Z952 Presence of prosthetic heart valve: Secondary | ICD-10-CM

## 2016-02-16 MED ORDER — FUROSEMIDE 20 MG PO TABS: 40.0000 mg | ORAL_TABLET | Freq: Every day | ORAL | 3 refills | Status: DC

## 2016-02-25 ENCOUNTER — Other Ambulatory Visit: Payer: Self-pay | Admitting: Family

## 2016-02-28 NOTE — Telephone Encounter (Signed)
Last refill was 01/20/16 for both medications. Last OV was in january

## 2016-02-29 NOTE — Telephone Encounter (Signed)
Rec'd fax pt requesting refills on Alprazolam & Tramadol. Pharmacy sent electronic script 11/24. Pls advise...Raechel Chute/lmb

## 2016-02-29 NOTE — Telephone Encounter (Signed)
[facility]ed scripts [facility]...Raechel Chute/lmb

## 2016-03-01 ENCOUNTER — Emergency Department (HOSPITAL_COMMUNITY): Payer: Medicare Other

## 2016-03-01 ENCOUNTER — Encounter (HOSPITAL_COMMUNITY): Payer: Self-pay | Admitting: *Deleted

## 2016-03-01 ENCOUNTER — Emergency Department (HOSPITAL_COMMUNITY)
Admission: EM | Admit: 2016-03-01 | Discharge: 2016-03-01 | Disposition: A | Discharge status: Home / Self Care | Payer: Medicare Other | Attending: Emergency Medicine | Admitting: Emergency Medicine

## 2016-03-01 DIAGNOSIS — J449 Chronic obstructive pulmonary disease, unspecified: Secondary | ICD-10-CM | POA: Diagnosis not present

## 2016-03-01 DIAGNOSIS — I110 Hypertensive heart disease with heart failure: Secondary | ICD-10-CM | POA: Insufficient documentation

## 2016-03-01 DIAGNOSIS — Z87891 Personal history of nicotine dependence: Secondary | ICD-10-CM | POA: Diagnosis not present

## 2016-03-01 DIAGNOSIS — X58XXXA Exposure to other specified factors, initial encounter: Secondary | ICD-10-CM | POA: Insufficient documentation

## 2016-03-01 DIAGNOSIS — Z7982 Long term (current) use of aspirin: Secondary | ICD-10-CM | POA: Diagnosis not present

## 2016-03-01 DIAGNOSIS — S79911A Unspecified injury of right hip, initial encounter: Secondary | ICD-10-CM | POA: Diagnosis present

## 2016-03-01 DIAGNOSIS — Y9389 Activity, other specified: Secondary | ICD-10-CM | POA: Insufficient documentation

## 2016-03-01 DIAGNOSIS — S76011A Strain of muscle, fascia and tendon of right hip, initial encounter: Secondary | ICD-10-CM | POA: Diagnosis not present

## 2016-03-01 DIAGNOSIS — I509 Heart failure, unspecified: Secondary | ICD-10-CM | POA: Diagnosis not present

## 2016-03-01 DIAGNOSIS — Y92007 Garden or yard of unspecified non-institutional (private) residence as the place of occurrence of the external cause: Secondary | ICD-10-CM | POA: Diagnosis not present

## 2016-03-01 DIAGNOSIS — Y999 Unspecified external cause status: Secondary | ICD-10-CM | POA: Diagnosis not present

## 2016-03-01 LAB — COMPREHENSIVE METABOLIC PANEL
ALT: 19 U/L (ref 17–63)
ANION GAP: 8 (ref 5–15)
AST: 31 U/L (ref 15–41)
Albumin: 3.8 g/dL (ref 3.5–5.0)
Alkaline Phosphatase: 60 U/L (ref 38–126)
BUN: 14 mg/dL (ref 6–20)
CHLORIDE: 101 mmol/L (ref 101–111)
CO2: 29 mmol/L (ref 22–32)
Calcium: 9.2 mg/dL (ref 8.9–10.3)
Creatinine, Ser: 1.08 mg/dL (ref 0.61–1.24)
GFR, EST NON AFRICAN AMERICAN: 60 mL/min — AB (ref >60)
Glucose, Bld: 113 mg/dL — ABNORMAL HIGH (ref 65–99)
POTASSIUM: 4.1 mmol/L (ref 3.5–5.1)
Sodium: 138 mmol/L (ref 135–145)
TOTAL PROTEIN: 6.9 g/dL (ref 6.5–8.1)
Total Bilirubin: 0.4 mg/dL (ref 0.3–1.2)

## 2016-03-01 LAB — URINALYSIS, ROUTINE W REFLEX MICROSCOPIC
Bilirubin Urine: NEGATIVE
GLUCOSE, UA: NEGATIVE mg/dL
Hgb urine dipstick: NEGATIVE
KETONES UR: NEGATIVE mg/dL
LEUKOCYTES UA: NEGATIVE
NITRITE: NEGATIVE
PH: 7.5 (ref 5.0–8.0)
Protein, ur: NEGATIVE mg/dL
Specific Gravity, Urine: 1.014 (ref 1.005–1.030)

## 2016-03-01 LAB — CBC
HEMATOCRIT: 39.5 % (ref 39.0–52.0)
HEMOGLOBIN: 13.2 g/dL (ref 13.0–17.0)
MCH: 31.4 pg (ref 26.0–34.0)
MCHC: 33.4 g/dL (ref 30.0–36.0)
MCV: 93.8 fL (ref 78.0–100.0)
Platelets: 113 10*3/uL — ABNORMAL LOW (ref 150–400)
RBC: 4.21 MIL/uL — AB (ref 4.22–5.81)
RDW: 13.5 % (ref 11.5–15.5)
WBC: 4 10*3/uL (ref 4.0–10.5)

## 2016-03-01 LAB — LIPASE, BLOOD: LIPASE: 29 U/L (ref 11–51)

## 2016-03-01 LAB — I-STAT TROPONIN, ED: Troponin i, poc: 0.02 ng/mL (ref 0.00–0.08)

## 2016-03-01 MED ORDER — IOPAMIDOL (ISOVUE-370) INJECTION 76%
INTRAVENOUS | Status: AC
  Administered 2016-03-01: 100 mL
  Filled 2016-03-01: qty 100

## 2016-03-01 NOTE — ED Notes (Signed)
Urinal provided with instructions

## 2016-03-01 NOTE — ED Notes (Signed)
Pt called out to say he is ready to go, [place]

## 2016-03-01 NOTE — ED Notes (Signed)
Pt stable, ambulatory, states understanding of discharge instructions 

## 2016-03-01 NOTE — ED Triage Notes (Signed)
Pt has right side abd pain and nausea today. Has known hx of abdominal aneurysm. No acute distress noted at triage.

## 2016-03-01 NOTE — ED Notes (Signed)
Patient transported to CT 

## 2016-03-01 NOTE — Discharge Instructions (Signed)
Your hip and abdominal pain is likely due to muscle strain.  Take tylenol as needed for pain.  Follow up with your doctor for further care.

## 2016-03-01 NOTE — ED Notes (Signed)
Patient returned from CT

## 2016-03-01 NOTE — ED Provider Notes (Signed)
[facility]-EMERGENCY DEPT Provider Note   CSN: [id number] Arrival date & time: 03/01/16  1255     History   Chief Complaint Chief Complaint  Patient presents with  . Abdominal Pain    HPI Lafayette DragonJoseph A Kabel is a 82 y.o. male.  HPI   82 year old male with history of AAA, aortic stenosis, alcohol abuse, CHF, hypertension, COPD presenting with complaints of abdominal pain. Patient reports this afternoon he was working in the garden when he developed sharp pain to his right hip that subsequently radiates to the left side of abdomen and up towards his chest lasting for approximately 2 hours and has since resolved.  He sts he was out in the garden burying his dog and have been digging for approximately 1 hr.  He felt the pain may be due to arthritis. During that episode patient felt very nauseous but without vomiting. He took some Xanax and symptom subsided. He denies any associated fever, chills, lightheadedness, dizziness, chest pain, shortness of breath, heart palpitation, focal numbness or weakness.    Patient has an eccentric saccular aneurysm that is now 5 cm in diameter with a very irregular appearing shape to suggest suspected previously ruptured PAU. He has been seen [facility].   His last scan was this past August. He was scheduled for elective surgical repair of his AAA up roughly 3 weeks ago but states that he felt scared and did not go through with it. He is here today voicing concern for potential complication of his AAA. He denies any heavy lifting or any recent injury.   Past Medical History:  Diagnosis Date  . Abdominal aortic aneurysm (HCC) 10/29/2011   3.7cm AAA by CT scan with unusual appearance suggestive of penetrating atherosclerotic ulcer or localized dissection  . Alcohol abuse   . Aortic stenosis    Echo 7/14:  Severe LVH, EF 55-60%, severe AS, mean gradient 78 mmHg, MAC, mild LAE  . Arthritis    "in the low vertebra"  . CHF (congestive  heart failure) (HCC)   . Chronic lower back pain   . Complication of anesthesia    NO NECK LINES ON RIGHT FOR SURGERY  . COPD (chronic obstructive pulmonary disease) (HCC)   . Decreased libido   . Depression   . DIZZINESS, CHRONIC   . Essential hypertension, benign   . GERD (gastroesophageal reflux disease)    occ  . Heart murmur   . Perivalvular leak of prosthetic heart valve    echo (02/21/13): Mild LVH, EF 55-60%, aortic valve with mild to moderate perivalvular leak, AI, mean aortic valve gradient 6 mm of mercury, MAC.  . 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 Scoliosis/kyphoscoliosis 10/28/2011  . Shortness of breath 10/24/11   "all the time"    Patient Active Problem List   Diagnosis Date Noted  . Nausea without vomiting 04/06/2015  . Protein-calorie malnutrition, severe (HCC) 02/24/2015  . Dehydration 02/23/2015  . H/O aortic valve replacement 04/14/2013  . Osteoarthritis of right hip 01/29/2013  . 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 Aortic stenosis, severe 11/20/2011  . Abdominal aortic aneurysm (HCC) 10/29/2011  . Scoliosis/kyphoscoliosis 10/28/2011  . Alcohol intoxication (HCC) 10/24/2011  . Physical deconditioning 10/24/2011  . Generalized weakness 10/24/2011  . Altered mental status 10/24/2011  . Fever and chills 10/24/2011  . Dysphagia 10/24/2011  .  Essential hypertension, benign 03/04/2010  . AORTIC STENOSIS 09/03/2008  . COPD bronchitis 09/03/2008  . DEPRESSION 07/19/2007  . CELLULITIS AND ABSCESS OF FOOT EXCEPT TOES 07/19/2007  . DIZZINESS, CHRONIC 07/11/2007  . DECREASED LIBIDO 07/11/2007  . Alcohol abuse 07/08/2007  . HEADACHE 07/08/2007  . HEART MURMUR 07/11/2006  . DYSPNEA ON EXERTION 07/11/2006  . RLQ PAIN 07/11/2006    Past Surgical History:  Procedure Laterality Date   . INTRAOPERATIVE TRANSESOPHAGEAL ECHOCARDIOGRAM N/A 01/21/2013   Procedure: INTRAOPERATIVE TRANSESOPHAGEAL ECHOCARDIOGRAM;  Surgeon: Purcell Nails, MD;  Location: [facility] OR;  Service: Open Heart Surgery;  Laterality: N/A;  . LEFT AND RIGHT HEART CATHETERIZATION WITH CORONARY ANGIOGRAM N/A 10/27/2011   Procedure: LEFT AND RIGHT HEART CATHETERIZATION WITH CORONARY ANGIOGRAM;  Surgeon: Herby Abraham, MD;  Location: [facility] CATH LAB;  Service: Cardiovascular;  Laterality: N/A;  . NO PAST SURGERIES    . THORACOTOMY Right 01/21/2013   Procedure: MINI/LIMITED THORACOTOMY;  Surgeon: Purcell Nails, MD;  Location: [facility] OR;  Service: Open Heart Surgery;  Laterality: Right;  . TRANSCATHETER AORTIC VALVE REPLACEMENT, TRANSAORTIC N/A 01/21/2013   Procedure: TRANSCATHETER AORTIC VALVE REPLACEMENT, TRANSAORTIC;  Surgeon: Purcell Nails, MD;  Location: [facility] OR;  Service: Open Heart Surgery;  Laterality: N/A;       Home Medications    Prior to Admission medications   Medication Sig Start Date End Date Taking? Authorizing Provider  ALPRAZolam Prudy Feeler) 0.5 MG tablet Take 1 tablet by mouth twice daily AS NEEDED for anxiety 02/29/16   Veryl Speak, FNP  aspirin EC 325 MG tablet Take 1 tablet (325 mg total) by mouth every other day. Patient not taking: Reported on 12/03/2015 08/17/14   Lewayne Bunting, MD  Bioflavonoid Products (ESTER C PO) Take 1,000 mg by mouth daily.    Historical Provider, MD  Cholecalciferol (VITAMIN D-3) 5000 UNITS TABS Take 1 capsule by mouth.    Historical Provider, MD  furosemide (LASIX) 20 MG tablet Take 2 tablets (40 mg total) by mouth daily. 02/16/16   Lewayne Bunting, MD  metoprolol succinate (TOPROL-XL) 25 MG 24 hr tablet TAKE 1/2 TABLET BY MOUTH DAILY FOR BLOOD PRESSURE 04/21/15   Veryl Speak, FNP  Multiple Vitamin (MULTIVITAMIN WITH MINERALS) TABS Take 1 tablet by mouth daily. 10/30/11   Penny Pia, MD  ondansetron (ZOFRAN) 4 MG tablet Take 1 tablet (4 mg total) by mouth every  6 (six) hours as needed for nausea. 02/26/15   Clydia Llano, MD  ondansetron (ZOFRAN-ODT) 4 MG disintegrating tablet Take 1 tablet (4 mg total) by mouth every 8 (eight) hours as needed for nausea or vomiting. 04/06/15   Veryl Speak, FNP  POTASSIUM GLUCONATE ER PO Take by mouth daily. OTC    Historical Provider, MD  SELENIUM PO Take 1 tablet by mouth daily.    Historical Provider, MD  traMADol Janean Sark) 50 MG tablet Take 1 tablet by mouth twice daily for pain 02/29/16   Veryl Speak, FNP  vitamin B-12 (CYANOCOBALAMIN) 100 MCG tablet Take 50 mcg by mouth daily.      Historical Provider, MD    Family History Family History  Problem Relation Age of Onset  . Heart failure Mother 54  . Thyroid disease Mother   . Heart attack Father 24  . Stroke Father   . Other      no known medical hx of heart disease  . Heart attack Brother 51  . Diabetes Brother   . Heart attack Brother 67  .  Diabetes Sister   . Hypertension Sister     Social History Social History  Substance Use Topics  . Smoking status: Former Smoker    Years: 2.50    Types: Cigarettes    Quit date: 07/02/1952  . Smokeless tobacco: Never Used     Comment: wine x2 q nite in last week 01/17/13  . Alcohol use 37.8 oz/week    42 Glasses of wine, 21 Cans of beer per week     Comment: 10/24/11 "drink q day; quite a bit"; Started 1974 (mostly hard liquor)     Allergies   Patient has no known allergies.   Review of Systems Review of Systems  All other systems reviewed and are negative.    Physical Exam Updated Vital Signs BP 151/60   Pulse 72   Temp 97.8 F (36.6 C) (Oral)   Resp 17   SpO2 100%   Physical Exam  Constitutional: He is oriented to person, place, and time. He appears well-developed and well-nourished. No distress.  HENT:  Head: Atraumatic.  Eyes: Conjunctivae are normal.  Neck: Neck supple.  Cardiovascular: Normal rate, regular rhythm and intact distal pulses.   Pulmonary/Chest: Effort normal  and breath sounds normal.  Abdominal: Soft. Bowel sounds are normal. He exhibits no distension. There is no tenderness.  No abdominal bruit all abdominal pulsatile mass  Musculoskeletal: He exhibits tenderness (Mild tenderness to right hip at the trochanteric region with normal hip flexion extension abduction and adduction.). He exhibits no edema.  Neurological: He is alert and oriented to person, place, and time. He displays normal reflexes.  Skin: No rash noted.  Psychiatric: He has a normal mood and affect.  Nursing note and vitals reviewed.    ED Treatments / Results  Labs (all labs ordered are listed, but only abnormal results are displayed) Labs Reviewed  COMPREHENSIVE METABOLIC PANEL - Abnormal; Notable for the following:       Result Value   Glucose, Bld 113 (*)    GFR calc non Af Amer 60 (*)    All other components within normal limits  CBC - Abnormal; Notable for the following:    RBC 4.21 (*)    Platelets 113 (*)    All other components within normal limits  LIPASE, BLOOD  URINALYSIS, ROUTINE W REFLEX MICROSCOPIC (NOT AT [facility])  I-STAT TROPOININ, ED    EKG  EKG Interpretation None       Radiology No results found.  Procedures Procedures (including critical care time)  Medications Ordered in ED Medications - No data to display   Initial Impression / Assessment and Plan / ED Course  I have reviewed the triage vital signs and the nursing notes.  Pertinent labs & imaging results that were available during my care of the patient were reviewed by me and considered in my medical decision making (see chart for details).  Clinical Course     BP 154/70   Pulse 67   Temp 97.8 F (36.6 C) (Oral)   Resp 22   SpO2 98%    Final Clinical Impressions(s) / ED Diagnoses   Final diagnoses:  Hip strain, right, initial encounter    New Prescriptions Discharge Medication List as of 03/01/2016  6:48 PM     4:03 PM This is an elderly male with known history  of AAA here with right hip pain that radiates to his abdomen and chest. Pain was transient and has since completely resolved. He has intact distal pulses and no abdominal  pain at this time. Given his significant vascular history, I will obtain a chest abdomen and pelvis CT angiogram to rule out dissection. Care discussed with Dr. Donnald GarrePfeiffer.   5:59 PM Patient is currently a symptomatic, no abdominal discomfort on exam. No significant right hip pain on exam either. Ambulate without difficulty. Labs are reassuring. CT scan shows no new dissection. However incidental small moderate free fluid noted to the right hemidiaphragm, unchanged from prior CT several months ago. I did consult with general surgeon, Dr. Lindie SpruceWyatt who will review the CT scan but anticipate that patient will be discharged. Patient is made aware of plan.   Fayrene HelperBowie Josslin Sanjuan, PA-C 03/04/16 1508    Arby BarretteMarcy Pfeiffer, MD 03/05/16 [phone]

## 2016-03-07 ENCOUNTER — Ambulatory Visit: Payer: Medicare Other | Admitting: Family

## 2016-03-07 ENCOUNTER — Ambulatory Visit: Payer: Medicare Other | Admitting: Thoracic Surgery (Cardiothoracic Vascular Surgery)

## 2016-03-09 ENCOUNTER — Ambulatory Visit (INDEPENDENT_AMBULATORY_CARE_PROVIDER_SITE_OTHER): Payer: Medicare Other | Admitting: Family

## 2016-03-09 ENCOUNTER — Encounter: Payer: Self-pay | Admitting: Family

## 2016-03-09 VITALS — BP 148/60 | HR 73 | Temp 97.7°F | Resp 22 | Ht 71.0 in | Wt 146.0 lb

## 2016-03-09 DIAGNOSIS — J069 Acute upper respiratory infection, unspecified: Secondary | ICD-10-CM

## 2016-03-09 NOTE — Patient Instructions (Addendum)
Thank you for choosing [facility].  SUMMARY AND INSTRUCTIONS:  Please continue to take your medications as prescribed.   Medication:  Your prescription(s) have been submitted to your pharmacy or been printed and provided for you. Please take as directed and contact our office if you believe you are having problem(s) with the medication(s) or have any questions.  Follow up:  If your symptoms worsen or fail to improve, please contact our office for further instruction, or in case of emergency go directly to the emergency room at the closest medical facility.    Upper Respiratory Infection, Adult Most upper respiratory infections (URIs) are a viral infection of the air passages leading to the lungs. A URI affects the nose, throat, and upper air passages. The most common type of URI is nasopharyngitis and is typically referred to as "the common cold." URIs run their course and usually go away on their own. Most of the time, a URI does not require medical attention, but sometimes a bacterial infection in the upper airways can follow a viral infection. This is called a secondary infection. Sinus and middle ear infections are common types of secondary upper respiratory infections. Bacterial pneumonia can also complicate a URI. A URI can worsen asthma and chronic obstructive pulmonary disease (COPD). Sometimes, these complications can require emergency medical care and may be life threatening. What are the causes? Almost all URIs are caused by viruses. A virus is a type of germ and can spread from one person to another. What increases the risk? You may be at risk for a URI if:  You smoke.  You have chronic heart or lung disease.  You have a weakened defense (immune) system.  You are very young or very old.  You have nasal allergies or asthma.  You work in crowded or poorly ventilated areas.  You work in health care facilities or schools. What are the signs or symptoms? Symptoms  typically develop 2-3 days after you come in contact with a cold virus. Most viral URIs last 7-10 days. However, viral URIs from the influenza virus (flu virus) can last 14-18 days and are typically more severe. Symptoms may include:  Runny or stuffy (congested) nose.  Sneezing.  Cough.  Sore throat.  Headache.  Fatigue.  Fever.  Loss of appetite.  Pain in your forehead, behind your eyes, and over your cheekbones (sinus pain).  Muscle aches. How is this diagnosed? Your health care provider may diagnose a URI by:  Physical exam.  Tests to check that your symptoms are not due to another condition such as:  Strep throat.  Sinusitis.  Pneumonia.  Asthma. How is this treated? A URI goes away on its own with time. It cannot be cured with medicines, but medicines may be prescribed or recommended to relieve symptoms. Medicines may help:  Reduce your fever.  Reduce your cough.  Relieve nasal congestion. Follow these instructions at home:  Take medicines only as directed by your health care provider.  Gargle warm saltwater or take cough drops to comfort your throat as directed by your health care provider.  Use a warm mist humidifier or inhale steam from a shower to increase air moisture. This may make it easier to breathe.  Drink enough fluid to keep your urine clear or pale yellow.  Eat soups and other clear broths and maintain good nutrition.  Rest as needed.  Return to work when your temperature has returned to normal or as your health care provider advises. You may  need to stay home longer to avoid infecting others. You can also use a face mask and careful hand washing to prevent spread of the virus.  Increase the usage of your inhaler if you have asthma.  Do not use any tobacco products, including cigarettes, chewing tobacco, or electronic cigarettes. If you need help quitting, ask your health care provider. How is this prevented? The best way to protect  yourself from getting a cold is to practice good hygiene.  Avoid oral or hand contact with people with cold symptoms.  Wash your hands often if contact occurs. There is no clear evidence that vitamin C, vitamin E, echinacea, or exercise reduces the chance of developing a cold. However, it is always recommended to get plenty of rest, exercise, and practice good nutrition. Contact a health care provider if:  You are getting worse rather than better.  Your symptoms are not controlled by medicine.  You have chills.  You have worsening shortness of breath.  You have brown or red mucus.  You have yellow or brown nasal discharge.  You have pain in your face, especially when you bend forward.  You have a fever.  You have swollen neck glands.  You have pain while swallowing.  You have white areas in the back of your throat. Get help right away if:  You have severe or persistent:  Headache.  Ear pain.  Sinus pain.  Chest pain.  You have chronic lung disease and any of the following:  Wheezing.  Prolonged cough.  Coughing up blood.  A change in your usual mucus.  You have a stiff neck.  You have changes in your:  Vision.  Hearing.  Thinking.  Mood. This information is not intended to replace advice given to you by your health care provider. Make sure you discuss any questions you have with your health care provider. Document Released: 09/13/2000 Document Revised: 11/21/2015 Document Reviewed: 06/25/2013 Elsevier Interactive Patient Education  2017 [employer]

## 2016-03-09 NOTE — Assessment & Plan Note (Signed)
Symptoms and exam are consistent with acute upper respiratory infection that appears to be improving. No confusion. Continue supportive care and follow up if symptoms worsen or do not continually improve.

## 2016-03-09 NOTE — Progress Notes (Signed)
Subjective:    Patient ID: Samuel [name], male    DOB: [birth year], [age] y.o.   MRN: [id number]  Chief Complaint  Patient presents with  . Cough    x3 weeks, cough, congestion, SOB, wheezing    HPI:  Samuel [name] is a 82 y.o. male who  has a past medical history of Abdominal aortic aneurysm (HCC) (10/29/2011); Alcohol abuse; Aortic stenosis; Arthritis; CHF (congestive heart failure) (HCC); Chronic lower back pain; Complication of anesthesia; COPD (chronic obstructive pulmonary disease) (HCC); Decreased libido; Depression; DIZZINESS, CHRONIC; Essential hypertension, benign; GERD (gastroesophageal reflux disease); Heart murmur; Perivalvular leak of prosthetic heart valve; S/P TAVR (transcatheter aortic valve replacement) (01/21/2013); Scoliosis/kyphoscoliosis (10/28/2011); and Shortness of breath (10/24/11). and presents today for a follow up office visit.  This is a new problem. Associated symptoms of cough, congestion, shortness of breath and wheezing has been going on for about 1 week but recently got over a cold about 2 weeks ago. Denies any fever.There are no modifying factors that make it better or worse. Course of the symptoms is generally improving. Family denies any confusion or disorientation.    No Known Allergies    Outpatient Medications Prior to Visit  Medication Sig Dispense Refill  . ALPRAZolam (XANAX) 0.5 MG tablet Take 1 tablet by mouth twice daily AS NEEDED for anxiety 60 tablet 0  . aspirin EC 325 MG tablet Take 1 tablet (325 mg total) by mouth every other day. 30 tablet 0  . Bioflavonoid Products (ESTER C PO) Take 1,000 mg by mouth daily.    . Cholecalciferol (VITAMIN D-3) 5000 UNITS TABS Take 1 capsule by mouth.    . furosemide (LASIX) 20 MG tablet Take 2 tablets (40 mg total) by mouth daily. 180 tablet 3  . metoprolol succinate (TOPROL-XL) 25 MG 24 hr tablet TAKE 1/2 TABLET BY MOUTH DAILY FOR BLOOD PRESSURE 30 tablet 0  . Multiple Vitamin (MULTIVITAMIN WITH  MINERALS) TABS Take 1 tablet by mouth daily. 30 tablet 0  . POTASSIUM GLUCONATE ER PO Take by mouth daily. OTC    . SELENIUM PO Take 1 tablet by mouth daily.    . traMADol (ULTRAM) 50 MG tablet Take 1 tablet by mouth twice daily for pain 60 tablet 0  . vitamin B-12 (CYANOCOBALAMIN) 100 MCG tablet Take 50 mcg by mouth daily.      . ondansetron (ZOFRAN) 4 MG tablet Take 1 tablet (4 mg total) by mouth every 6 (six) hours as needed for nausea. (Patient not taking: Reported on 03/01/2016) 20 tablet 0  . ondansetron (ZOFRAN-ODT) 4 MG disintegrating tablet Take 1 tablet (4 mg total) by mouth every 8 (eight) hours as needed for nausea or vomiting. (Patient not taking: Reported on 03/01/2016) 20 tablet 0   No facility-administered medications prior to visit.      Review of Systems  Constitutional: Negative for chills and fever.  HENT: Positive for congestion. Negative for sinus pain, sinus pressure and sore throat.   Respiratory: Positive for cough and shortness of breath. Negative for chest tightness and wheezing.   Cardiovascular: Negative for chest pain.  Neurological: Negative for headaches.      Objective:    BP (!) 148/60 (BP Location: Left Arm, Patient Position: Sitting, Cuff Size: Normal)   Pulse 73   Temp 97.7 F (36.5 C) (Oral)   Resp (!) 22   Ht 5\' 11"  (1.803 m)   Wt 146 lb (66.2 kg)   SpO2 94%   BMI 20.36 kg/m  Nursing note and vital signs reviewed.  Physical Exam  Constitutional: He is oriented to person, place, and time. He appears well-developed and well-nourished. No distress.  HENT:  Right Ear: Hearing, tympanic membrane, external ear and ear canal normal.  Left Ear: Hearing, tympanic membrane, external ear and ear canal normal.  Nose: Nose normal.  Mouth/Throat: Uvula is midline, oropharynx is clear and moist and mucous membranes are normal.  Cardiovascular: Normal rate, regular rhythm, normal heart sounds and intact distal pulses.   Pulmonary/Chest: Effort normal  and breath sounds normal.  Neurological: He is alert and oriented to person, place, and time.  Skin: Skin is warm and dry.  Psychiatric: He has a normal mood and affect. His behavior is normal. Judgment and thought content normal.       Assessment & Plan:   Problem List Items Addressed This Visit      Respiratory   Acute upper respiratory infection - Primary    Symptoms and exam are consistent with acute upper respiratory infection that appears to be improving. No confusion. Continue supportive care and follow up if symptoms worsen or do not continually improve.           I have discontinued Mr. Wardrop's ondansetron and ondansetron. I am also having him maintain his vitamin B-12, multivitamin with minerals, SELENIUM PO, Bioflavonoid Products (ESTER C PO), Vitamin D-3, POTASSIUM GLUCONATE ER PO, aspirin EC, metoprolol succinate, furosemide, ALPRAZolam, and traMADol.   Follow-up: Return if symptoms worsen or fail to improve.  Jeanine Luzalone, [place], FNP

## 2016-03-14 ENCOUNTER — Encounter: Payer: Medicare Other | Admitting: Thoracic Surgery (Cardiothoracic Vascular Surgery)

## 2016-03-14 NOTE — Progress Notes (Signed)
This encounter was created in error - please disregard.

## 2016-04-05 ENCOUNTER — Other Ambulatory Visit: Payer: Self-pay | Admitting: Cardiology

## 2016-04-05 ENCOUNTER — Other Ambulatory Visit: Payer: Self-pay | Admitting: Family

## 2016-04-05 DIAGNOSIS — Z952 Presence of prosthetic heart valve: Secondary | ICD-10-CM

## 2016-04-05 NOTE — Telephone Encounter (Signed)
Faxed script back to [facility].../lmb 

## 2016-04-10 ENCOUNTER — Other Ambulatory Visit: Payer: Self-pay | Admitting: Family

## 2016-04-10 NOTE — Telephone Encounter (Signed)
Routing to greg, please advise, thanks 

## 2016-04-10 NOTE — Telephone Encounter (Signed)
Faxed

## 2016-05-08 ENCOUNTER — Other Ambulatory Visit: Payer: Self-pay | Admitting: Family

## 2016-05-08 NOTE — Telephone Encounter (Signed)
Last refill was 04/10/16

## 2016-05-09 NOTE — Telephone Encounter (Signed)
Faxed

## 2016-05-17 ENCOUNTER — Other Ambulatory Visit: Payer: Self-pay | Admitting: Family

## 2016-05-17 ENCOUNTER — Other Ambulatory Visit: Payer: Self-pay | Admitting: Cardiology

## 2016-05-17 DIAGNOSIS — Z952 Presence of prosthetic heart valve: Secondary | ICD-10-CM

## 2016-05-17 NOTE — Telephone Encounter (Signed)
Last refill was 04/10/16

## 2016-05-18 NOTE — Telephone Encounter (Signed)
[facility]...Raechel Chute/lmb

## 2016-06-13 ENCOUNTER — Other Ambulatory Visit: Payer: Self-pay | Admitting: Family

## 2016-07-17 ENCOUNTER — Other Ambulatory Visit: Payer: Self-pay | Admitting: Family

## 2016-07-17 ENCOUNTER — Encounter: Payer: Self-pay | Admitting: Family

## 2016-07-17 ENCOUNTER — Ambulatory Visit (INDEPENDENT_AMBULATORY_CARE_PROVIDER_SITE_OTHER): Payer: Medicare Other | Admitting: Family

## 2016-07-17 VITALS — BP 160/68 | HR 66 | Temp 98.2°F | Resp 16 | Ht 71.0 in | Wt 150.0 lb

## 2016-07-17 DIAGNOSIS — I10 Essential (primary) hypertension: Secondary | ICD-10-CM | POA: Diagnosis not present

## 2016-07-17 DIAGNOSIS — M419 Scoliosis, unspecified: Secondary | ICD-10-CM

## 2016-07-17 MED ORDER — ALPRAZOLAM 0.5 MG PO TABS: 0.5000 mg | ORAL_TABLET | Freq: Two times a day (BID) | ORAL | 0 refills | Status: DC | PRN

## 2016-07-17 MED ORDER — METOPROLOL SUCCINATE ER 25 MG PO TB24: ORAL_TABLET | ORAL | 0 refills | Status: DC

## 2016-07-17 MED ORDER — TRAMADOL HCL 50 MG PO TABS: ORAL_TABLET | ORAL | 1 refills | Status: DC

## 2016-07-17 NOTE — Assessment & Plan Note (Signed)
Symptoms are stable with pharmacological and nonpharmacological therapies. Continue current dosage of tramadol. No evidence of constipation. [place] controlled substance database reviewed with no irregularities.

## 2016-07-17 NOTE — Patient Instructions (Addendum)
Thank you for choosing [facility].  SUMMARY AND INSTRUCTIONS:  Please continue to take your medication as prescribed.  Continue to monitor your blood pressure at home at different times throughout the day. Let us know if your average blood pressure is above 150 on a regular basis.   Follow a low sodium diet.   Continue to take the Tramadol as needed.   If you have medicare related insurance (such as traditional Medicare, [employer], [employer], or similar), Please make an appointment at the scheduling desk with Noreene Larsson, the [employer], for your Wellness visit in this office, which is a benefit with your insurance.   Medication:  Your prescription(s) have been submitted to your pharmacy or been printed and provided for you. Please take as directed and contact our office if you believe you are having problem(s) with the medication(s) or have any questions.  Labs:  Please stop by the lab on the lower level of the building for your blood work. Your results will be released to MyChart (or called to you) after review, usually within 72 hours after test completion. If any changes need to be made, you will be notified at that same time.  1.) The lab is open from 7:30am to 5:30 pm Monday-Friday 2.) No appointment is necessary 3.) Fasting (if needed) is 6-8 hours after food and drink; black coffee and water are okay    Follow up:  If your symptoms worsen or fail to improve, please contact our office for further instruction, or in case of emergency go directly to the emergency room at the closest medical facility.

## 2016-07-17 NOTE — Progress Notes (Signed)
Subjective:    Patient ID: Samuel [name], male    DOB: 09-Apr-1926, 83 y.o.   MRN: [id number]  Chief Complaint  Patient presents with  . Follow upo    Blood pressure, med refill xanax and tramadol    HPI:  Samuel [name] is a 83 y.o. male who  has a past medical history of Abdominal aortic aneurysm ([place]) (10/29/2011); Alcohol abuse; Aortic stenosis; Arthritis; CHF (congestive heart failure) ([place]); Chronic lower back pain; Complication of anesthesia; COPD (chronic obstructive pulmonary disease) ([place]); Decreased libido; Depression; DIZZINESS, CHRONIC; Essential hypertension, benign; GERD (gastroesophageal reflux disease); Heart murmur; Perivalvular leak of prosthetic heart valve; S/P TAVR (transcatheter aortic valve replacement) (01/21/2013); Scoliosis/kyphoscoliosis (10/28/2011); and Shortness of breath (10/24/11). and presents today for a follow up office visit.   1.) Hypertension - Currently maintained on furosemide and metoprolol. Reports taking the medication as prescribed and denies adverse side effects or hypotensive readings. Denies worst headache of life with no new symptoms of end organ damage. Blood pressures at home  BP Readings from Last 3 Encounters:  07/17/16 (!) 160/68  03/09/16 (!) 148/60  03/01/16 163/57    2.) Scoliosis - Currently maintained on Tramadol and non-pharmacological therapies as needed for discomfort. Reports taking the medication as prescribed and denies adverse side effects or constipation. Pain is generally well controlled with the current medication regimen and he is able to be functional and complete his activities of daily living.     No Known Allergies    Outpatient Medications Prior to Visit  Medication Sig Dispense Refill  . aspirin EC 325 MG tablet Take 1 tablet (325 mg total) by mouth every other day. 30 tablet 0  . Bioflavonoid Products (ESTER C PO) Take 1,000 mg by mouth daily.    . Cholecalciferol (VITAMIN D-3) 5000 UNITS TABS Take 1  capsule by mouth.    . furosemide (LASIX) 20 MG tablet Take 2 tablets (40 mg total) by mouth daily. 180 tablet 1  . Multiple Vitamin (MULTIVITAMIN WITH MINERALS) TABS Take 1 tablet by mouth daily. 30 tablet 0  . POTASSIUM GLUCONATE ER PO Take by mouth daily. OTC    . SELENIUM PO Take 1 tablet by mouth daily.    . vitamin B-12 (CYANOCOBALAMIN) 100 MCG tablet Take 50 mcg by mouth daily.      Marland Kitchen ALPRAZolam (XANAX) 0.5 MG tablet take 1 TABLET BY MOUTH TWICE DAILY AS NEEDED for anxiety 60 tablet 0  . metoprolol succinate (TOPROL-XL) 25 MG 24 hr tablet TAKE 1/2 TABLET BY MOUTH DAILY FOR BLOOD PRESSURE 30 tablet 0  . metoprolol tartrate (LOPRESSOR) 25 MG tablet Take 0.5 tablets (12.5 mg total) by mouth daily. Needs office visit for more refills 30 tablet 0  . traMADol (ULTRAM) 50 MG tablet take 1 TABLET BY MOUTH TWICE DAILY for pain 60 tablet 1   No facility-administered medications prior to visit.       Past Surgical History:  Procedure Laterality Date  . INTRAOPERATIVE TRANSESOPHAGEAL ECHOCARDIOGRAM N/A 01/21/2013   Procedure: INTRAOPERATIVE TRANSESOPHAGEAL ECHOCARDIOGRAM;  Surgeon: Rexene Alberts, MD;  Location: [place];  Service: Open Heart Surgery;  Laterality: N/A;  . LEFT AND RIGHT HEART CATHETERIZATION WITH CORONARY ANGIOGRAM N/A 10/27/2011   Procedure: LEFT AND RIGHT HEART CATHETERIZATION WITH CORONARY ANGIOGRAM;  Surgeon: Hillary Bow, MD;  Location: [facility] CATH LAB;  Service: Cardiovascular;  Laterality: N/A;  . NO PAST SURGERIES    . THORACOTOMY Right 01/21/2013   Procedure: MINI/LIMITED THORACOTOMY;  Surgeon: Braulio Conte  Keturah Barre, MD;  Location: [place];  Service: Open Heart Surgery;  Laterality: Right;  . TRANSCATHETER AORTIC VALVE REPLACEMENT, TRANSAORTIC N/A 01/21/2013   Procedure: TRANSCATHETER AORTIC VALVE REPLACEMENT, TRANSAORTIC;  Surgeon: Rexene Alberts, MD;  Location: [place];  Service: Open Heart Surgery;  Laterality: N/A;      Past Medical History:  Diagnosis Date  . Abdominal  aortic aneurysm ([place]) 10/29/2011   3.7cm AAA by CT scan with unusual appearance suggestive of penetrating atherosclerotic ulcer or localized dissection  . Alcohol abuse   . Aortic stenosis    Echo 7/14:  Severe LVH, EF 55-60%, severe AS, mean gradient 78 mmHg, MAC, mild LAE  . Arthritis    "in the low vertebra"  . CHF (congestive heart failure) ([place])   . Chronic lower back pain   . Complication of anesthesia    NO NECK LINES ON RIGHT FOR SURGERY  . COPD (chronic obstructive pulmonary disease) ([place])   . Decreased libido   . Depression   . DIZZINESS, CHRONIC   . Essential hypertension, benign   . GERD (gastroesophageal reflux disease)    occ  . Heart murmur   . Perivalvular leak of prosthetic heart valve    echo (02/21/13): Mild LVH, EF 55-60%, aortic valve with mild to moderate perivalvular leak, AI, mean aortic valve gradient 6 mm of mercury, MAC.  . 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 Scoliosis/kyphoscoliosis 10/28/2011  . Shortness of breath 10/24/11   "all the time"      Review of Systems  Constitutional: Negative for chills and fever.  Eyes:       Negative for changes in vision  Respiratory: Negative for cough, chest tightness and wheezing.   Cardiovascular: Negative for chest pain, palpitations and leg swelling.  Neurological: Negative for dizziness, weakness and light-headedness.      Objective:    BP (!) 160/68 (BP Location: Left Arm, Patient Position: Sitting, Cuff Size: Normal)   Pulse 66   Temp 98.2 F (36.8 C) (Oral)   Resp 16   Ht _0  (1.803 m)   Wt 150 lb (68 kg)   SpO2 96%   BMI 20.92 kg/m  Nursing note and vital signs reviewed.  Physical Exam  Constitutional: He is oriented to person, place, and time. He appears well-developed and well-nourished. No distress.  Cardiovascular: Normal rate,  regular rhythm, normal heart sounds and intact distal pulses.   Pulmonary/Chest: Effort normal and breath sounds normal.  Neurological: He is alert and oriented to person, place, and time.  Skin: Skin is warm and dry.  Psychiatric: He has a normal mood and affect. His behavior is normal. Judgment and thought content normal.       Assessment & Plan:   Problem List Items Addressed This Visit      Cardiovascular and Mediastinum   Essential hypertension, benign - Primary    Blood pressure elevated above goal 140/90 with current medication regimen, however patient notes blood pressures at home are significantly lower. Denies worse headache of life with no symptoms of end organ damage noted on physical exam. Continue current dosage of metoprolol extended release. Encouraged follow low-sodium diet. Continue to monitor.      Relevant Medications   metoprolol succinate (TOPROL-XL) 25 MG 24 hr tablet     Musculoskeletal and Integument   Scoliosis/kyphoscoliosis (Chronic)    Symptoms are  stable with pharmacological and nonpharmacological therapies. Continue current dosage of tramadol. No evidence of constipation. [place] controlled substance database reviewed with no irregularities.          I have discontinued Mr. Kryder's metoprolol tartrate. I have also changed his ALPRAZolam and traMADol. Additionally, I am having him maintain his vitamin B-12, multivitamin with minerals, SELENIUM PO, Bioflavonoid Products (ESTER C PO), Vitamin D-3, POTASSIUM GLUCONATE ER PO, aspirin EC, furosemide, and metoprolol succinate.   Meds ordered this encounter  Medications  . metoprolol succinate (TOPROL-XL) 25 MG 24 hr tablet    Sig: TAKE 1/2 TABLET BY MOUTH DAILY FOR BLOOD PRESSURE    Dispense:  90 tablet    Refill:  0    Please discontinue previous metoprolol regular release.    Order Specific Question:   Supervising Provider    Answer:   Pricilla Holm A [[id number]]  . ALPRAZolam (XANAX) 0.5 MG  tablet    Sig: Take 1 tablet (0.5 mg total) by mouth 2 (two) times daily as needed. for anxiety    Dispense:  60 tablet    Refill:  0    Order Specific Question:   Supervising Provider    Answer:   Pricilla Holm A [[id number]]  . traMADol (ULTRAM) 50 MG tablet    Sig: Take 1 tablet by mouth twice daily as needed for pain.    Dispense:  60 tablet    Refill:  1    Order Specific Question:   Supervising Provider    Answer:   Pricilla Holm A [[id number]]     Follow-up: Return in about 3 months (around 10/16/2016), or if symptoms worsen or fail to improve.  Mauricio Po, FNP

## 2016-07-17 NOTE — Assessment & Plan Note (Signed)
Blood pressure elevated above goal 140/90 with current medication regimen, however patient notes blood pressures at home are significantly lower. Denies worse headache of life with no symptoms of end organ damage noted on physical exam. Continue current dosage of metoprolol extended release. Encouraged follow low-sodium diet. Continue to monitor.

## 2016-07-20 ENCOUNTER — Telehealth: Payer: Self-pay | Admitting: *Deleted

## 2016-07-20 MED ORDER — AMLODIPINE BESYLATE 5 MG PO TABS: 5.0000 mg | ORAL_TABLET | Freq: Every day | ORAL | 1 refills | Status: DC

## 2016-07-20 NOTE — Telephone Encounter (Signed)
Called patient to make an AWV appointment. Patient shared that his blood pressure has been running systolic 190's over the last couple of days. Nurse asked patient to take blood pressure to evaluate. B/P reading was 190/79, pulse 71. Nurse stated that she will notify PCP and would contact patient with PCP response.

## 2016-07-20 NOTE — Telephone Encounter (Signed)
Please continue to take medications as prescribed. New amlodipine sent to pharmacy. Follow up in 1 week with blood pressure numbers or sooner if remains elevated.

## 2016-07-20 NOTE — Telephone Encounter (Signed)
Called to inform patient to continue to take all of his prescribed medications and to start taking norvasc which was called into his pharmacy today by his PCP. Instructed patient to take his blood pressure in the morning and in the evening and record in a journal to bring to the follow-up appointment scheduled 07/31/16. Patient was informed to call to make an earlier appointment if his blood pressure remains elevated. Patient verbalized understanding.

## 2016-07-31 ENCOUNTER — Ambulatory Visit (INDEPENDENT_AMBULATORY_CARE_PROVIDER_SITE_OTHER): Payer: Medicare Other | Admitting: Family

## 2016-07-31 ENCOUNTER — Encounter: Payer: Self-pay | Admitting: Family

## 2016-07-31 VITALS — BP 154/62 | HR 67 | Resp 20 | Ht 71.0 in | Wt 150.0 lb

## 2016-07-31 DIAGNOSIS — Z0000 Encounter for general adult medical examination without abnormal findings: Secondary | ICD-10-CM

## 2016-07-31 DIAGNOSIS — I10 Essential (primary) hypertension: Secondary | ICD-10-CM

## 2016-07-31 MED ORDER — AMLODIPINE BESYLATE 5 MG PO TABS: 7.5000 mg | ORAL_TABLET | Freq: Every day | ORAL | 1 refills | Status: AC

## 2016-07-31 MED ORDER — AMLODIPINE BESYLATE 10 MG PO TABS: 10.0000 mg | ORAL_TABLET | Freq: Every day | ORAL | 1 refills | Status: DC

## 2016-07-31 NOTE — Patient Instructions (Addendum)
Increase your Norvasc (amlodipine) to 7.5 mg which is 1.5 tablets daily.  Continue to monitor your blood pressure at home and follow low sodium diet.   If you blood pressure remains elevated about 150/90 on average please let us know.    Continue to eat heart healthy diet (full of fruits, vegetables, whole grains, lean protein, water--limit salt, fat, and sugar intake) and increase physical activity as tolerated.  Continue doing brain stimulating activities (puzzles, reading, adult coloring books, staying active) to keep memory sharp.    Fall Prevention in the Home Falls can cause injuries. They can happen to people of all ages. There are many things you can do to make your home safe and to help prevent falls. What can I do on the outside of my home?  Regularly fix the edges of walkways and driveways and fix any cracks.  Remove anything that might make you trip as you walk through a door, such as a raised step or threshold.  Trim any bushes or trees on the path to your home.  Use bright outdoor lighting.  Clear any walking paths of anything that might make someone trip, such as rocks or tools.  Regularly check to see if handrails are loose or broken. Make sure that both sides of any steps have handrails.  Any raised decks and porches should have guardrails on the edges.  Have any leaves, snow, or ice cleared regularly.  Use sand or salt on walking paths during winter.  Clean up any spills in your garage right away. This includes oil or grease spills. What can I do in the bathroom?  Use night lights.  Install grab bars by the toilet and in the tub and shower. Do not use towel bars as grab bars.  Use non-skid mats or decals in the tub or shower.  If you need to sit down in the shower, use a plastic, non-slip stool.  Keep the floor dry. Clean up any water that spills on the floor as soon as it happens.  Remove soap buildup in the tub or shower regularly.  Attach bath  mats securely with double-sided non-slip rug tape.  Do not have throw rugs and other things on the floor that can make you trip. What can I do in the bedroom?  Use night lights.  Make sure that you have a light by your bed that is easy to reach.  Do not use any sheets or blankets that are too big for your bed. They should not hang down onto the floor.  Have a firm chair that has side arms. You can use this for support while you get dressed.  Do not have throw rugs and other things on the floor that can make you trip. What can I do in the kitchen?  Clean up any spills right away.  Avoid walking on wet floors.  Keep items that you use a lot in easy-to-reach places.  If you need to reach something above you, use a strong step stool that has a grab bar.  Keep electrical cords out of the way.  Do not use floor polish or wax that makes floors slippery. If you must use wax, use non-skid floor wax.  Do not have throw rugs and other things on the floor that can make you trip. What can I do with my stairs?  Do not leave any items on the stairs.  Make sure that there are handrails on both sides of the stairs and use them.  Fix handrails that are broken or loose. Make sure that handrails are as long as the stairways.  Check any carpeting to make sure that it is firmly attached to the stairs. Fix any carpet that is loose or worn.  Avoid having throw rugs at the top or bottom of the stairs. If you do have throw rugs, attach them to the floor with carpet tape.  Make sure that you have a light switch at the top of the stairs and the bottom of the stairs. If you do not have them, ask someone to add them for you. What else can I do to help prevent falls?  Wear shoes that:  Do not have high heels.  Have rubber bottoms.  Are comfortable and fit you well.  Are closed at the toe. Do not wear sandals.  If you use a stepladder:  Make sure that it is fully opened. Do not climb a closed  stepladder.  Make sure that both sides of the stepladder are locked into place.  Ask someone to hold it for you, if possible.  Clearly mark and make sure that you can see:  Any grab bars or handrails.  First and last steps.  Where the edge of each step is.  Use tools that help you move around (mobility aids) if they are needed. These include:  Canes.  Walkers.  Scooters.  Crutches.  Turn on the lights when you go into a dark area. Replace any light bulbs as soon as they burn out.  Set up your furniture so you have a clear path. Avoid moving your furniture around.  If any of your floors are uneven, fix them.  If there are any pets around you, be aware of where they are.  Review your medicines with your doctor. Some medicines can make you feel dizzy. This can increase your chance of falling. Ask your doctor what other things that you can do to help prevent falls. This information is not intended to replace advice given to you by your health care provider. Make sure you discuss any questions you have with your health care provider. Document Released: 01/14/2009 Document Revised: 08/26/2015 Document Reviewed: 04/24/2014 Elsevier Interactive Patient Education  2017 [employer].

## 2016-07-31 NOTE — Progress Notes (Signed)
Subjective:   Samuel [name] is a 83 y.o. male who presents for an Initial Medicare Annual Wellness Visit.  Review of Systems  No ROS.  Medicare Wellness Visit.  Cardiac Risk Factors include: none Sleep patterns: feels rested on waking, gets up 2-3 times nightly to void and sleeps 7-9 hours nightly.   Home Safety/Smoke Alarms: Feels safe in home. Smoke alarms in place.   Living environment; residence and Adult nurse: apartment, Media planner, equipment: Radio producer, Type: Single [place], [place], Type: Chartered loss adjuster wheelchair, no firearms. Seat Belt Safety/Bike Helmet: Wears seat belt.   Counseling:   Eye Exam- appointment every Dr. [facility]- dentures  Male:   CCS- N/A     PSA- No results found for: PSA    Objective:    Today's Vitals   07/31/16 1313  BP: (!) 154/62  Pulse: 67  Resp: 20  SpO2: 98%  Weight: 150 lb (68 kg)  Height: _0  (1.803 m)   Body mass index is 20.92 kg/m.  Current Medications (verified) Outpatient Encounter Prescriptions as of 07/31/2016  Medication Sig  . ALPRAZolam (XANAX) 0.5 MG tablet Take 1 tablet (0.5 mg total) by mouth 2 (two) times daily as needed. for anxiety  . amLODipine (NORVASC) 5 MG tablet Take 1 tablet (5 mg total) by mouth daily.  Marland Kitchen aspirin EC 325 MG tablet Take 1 tablet (325 mg total) by mouth every other day.  Marland Kitchen Bioflavonoid Products (ESTER C PO) Take 1,000 mg by mouth daily.  . Cholecalciferol (VITAMIN D-3) 5000 UNITS TABS Take 1 capsule by mouth.  . furosemide (LASIX) 20 MG tablet Take 2 tablets (40 mg total) by mouth daily.  . metoprolol succinate (TOPROL-XL) 25 MG 24 hr tablet TAKE 1/2 TABLET BY MOUTH DAILY FOR BLOOD PRESSURE  . Multiple Vitamin (MULTIVITAMIN WITH MINERALS) TABS Take 1 tablet by mouth daily.  Marland Kitchen POTASSIUM GLUCONATE ER PO Take by mouth daily. OTC  . SELENIUM PO Take 1 tablet by mouth daily.  Marland Kitchen Specialty Vitamins Products (MAGNESIUM, AMINO ACID CHELATE,) 133 MG tablet Take 1 tablet by mouth  2 (two) times daily.  . traMADol (ULTRAM) 50 MG tablet Take 1 tablet by mouth twice daily as needed for pain.  . vitamin B-12 (CYANOCOBALAMIN) 100 MCG tablet Take 50 mcg by mouth daily.     No facility-administered encounter medications on file as of 07/31/2016.     Allergies (verified) Patient has no known allergies.   History: Past Medical History:  Diagnosis Date  . Abdominal aortic aneurysm ([place]) 10/29/2011   3.7cm AAA by CT scan with unusual appearance suggestive of penetrating atherosclerotic ulcer or localized dissection  . Alcohol abuse   . Aortic stenosis    Echo 7/14:  Severe LVH, EF 55-60%, severe AS, mean gradient 78 mmHg, MAC, mild LAE  . Arthritis    "in the low vertebra"  . CHF (congestive heart failure) ([place])   . Chronic lower back pain   . Complication of anesthesia    NO NECK LINES ON RIGHT FOR SURGERY  . COPD (chronic obstructive pulmonary disease) ([place])   . Decreased libido   . Depression   . DIZZINESS, CHRONIC   . Essential hypertension, benign   . GERD (gastroesophageal reflux disease)    occ  . Heart murmur   . Perivalvular leak of prosthetic heart valve    echo (02/21/13): Mild LVH, EF 55-60%, aortic valve with mild to moderate perivalvular leak, AI, mean aortic valve gradient 6 mm of mercury,  MAC.  . 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 Scoliosis/kyphoscoliosis 10/28/2011  . Shortness of breath 10/24/11   "all the time"   Past Surgical History:  Procedure Laterality Date  . INTRAOPERATIVE TRANSESOPHAGEAL ECHOCARDIOGRAM N/A 01/21/2013   Procedure: INTRAOPERATIVE TRANSESOPHAGEAL ECHOCARDIOGRAM;  Surgeon: Rexene Alberts, MD;  Location: [place];  Service: Open Heart Surgery;  Laterality: N/A;  . LEFT AND RIGHT HEART CATHETERIZATION WITH CORONARY ANGIOGRAM N/A 10/27/2011   Procedure: LEFT AND RIGHT HEART  CATHETERIZATION WITH CORONARY ANGIOGRAM;  Surgeon: Hillary Bow, MD;  Location: [facility] CATH LAB;  Service: Cardiovascular;  Laterality: N/A;  . NO PAST SURGERIES    . THORACOTOMY Right 01/21/2013   Procedure: MINI/LIMITED THORACOTOMY;  Surgeon: Rexene Alberts, MD;  Location: [place];  Service: Open Heart Surgery;  Laterality: Right;  . TRANSCATHETER AORTIC VALVE REPLACEMENT, TRANSAORTIC N/A 01/21/2013   Procedure: TRANSCATHETER AORTIC VALVE REPLACEMENT, TRANSAORTIC;  Surgeon: Rexene Alberts, MD;  Location: [place];  Service: Open Heart Surgery;  Laterality: N/A;   Family History  Problem Relation Age of Onset  . Heart failure Mother 49  . Thyroid disease Mother   . Heart attack Father 81  . Stroke Father   . Other      no known medical hx of heart disease  . Heart attack Brother 72  . Diabetes Brother   . Heart attack Brother 49  . Diabetes Sister   . Hypertension Sister    Social History   Occupational History  . Retired    Social History Main Topics  . Smoking status: Former Smoker    Years: 2.50    Types: Cigarettes    Quit date: 07/02/1952  . Smokeless tobacco: Never Used     Comment: Amrom Ore x2 q nite in last week 01/17/13  . Alcohol use 1.8 - 3.6 oz/week    3 - 6 Cans of beer per week     Comment: quite a history of alcohol abuse for many years, states currently drinks beer weekly but not much  . Drug use: No  . Sexual activity: No   Tobacco Counseling Counseling given: Not Answered   Activities of Daily Living In your present state of health, do you have any difficulty performing the following activities: 07/31/2016  Hearing? Y  Vision? Y  Difficulty concentrating or making decisions? Y  Walking or climbing stairs? Y  Dressing or bathing? N  Doing errands, shopping? N  Preparing Food and eating ? N  Using the Toilet? N  In the past six months, have you accidently leaked urine? N  Do you have problems with loss of bowel control? N  Managing your Medications? N    Managing your Finances? N  Housekeeping or managing your Housekeeping? N  Some recent data might be hidden    Immunizations and Health Maintenance  There is no immunization history on file for this patient. Health Maintenance Due  Topic Date Due  . TETANUS/TDAP  01/30/1948  . PNA vac Low Risk Adult (1 of 2 - PCV13) 01/29/1994    Patient Care Team: Golden Circle, FNP as PCP - General (Family Medicine) Hillary Bow, MD (Cardiology) Lelon Perla, MD (Cardiology)  Indicate any recent Medical Services you may have received from other than [facility] providers in the past year (date may be approximate).    Assessment:   This  is a routine wellness examination for Samuel [name]. Physical assessment deferred to PCP.   Hearing/Vision screen Hearing Screening Comments: Able to hear conversational tones w/o difficulty. No issues reported.  Passed whisper test Vision Screening Comments: Cataracts, cannot do surgery due to hear and anesthia  Dietary issues and exercise activities discussed: Current Exercise Habits: Home exercise routine (works in the garden, walk in the hallway.), Time (Minutes): 25, Frequency (Times/Week): 4, Weekly Exercise (Minutes/Week): 100, Intensity: Mild, Exercise limited by: orthopedic condition(s)  Diet (meal preparation, eat out, water intake, caffeinated beverages, dairy products, fruits and vegetables): in general, a "healthy" diet  , low salt,  Limits soda, caffeine, drinks 2-3 glasses of water daily, supplements with protein powder. Encouraged patient to increase daily water intake.    Goals    . Maintain current health status          Continue to be as active as possible and eat healthy.      Depression Screen PHQ 2/9 Scores 07/31/2016  PHQ - 2 Score 0    Fall Risk Fall Risk  07/31/2016  Falls in the past year? Yes  Number falls in past yr: 2 or more  Injury with Fall? No  Risk Factor Category  High Fall Risk  Risk for fall due to : Impaired  balance/gait;Impaired mobility;Impaired vision  Follow up Education provided;Falls prevention discussed    Cognitive Function: MMSE - Mini Mental State Exam 07/31/2016  Orientation to time 5  Orientation to Place 5  Registration 3  Attention/ Calculation 4  Recall 1  Language- name 2 objects 2  Language- repeat 1  Language- follow 3 step command 3  Language- read & follow direction 1  Write a sentence 1  Copy design 1  Total score 27        Screening Tests Health Maintenance  Topic Date Due  . TETANUS/TDAP  01/30/1948  . PNA vac Low Risk Adult (1 of 2 - PCV13) 01/29/1994  . INFLUENZA VACCINE  11/01/2016        Plan:  Continue to eat heart healthy diet (full of fruits, vegetables, whole grains, lean protein, water--limit salt, fat, and sugar intake) and increase physical activity as tolerated.  Continue doing brain stimulating activities (puzzles, reading, adult coloring books, staying active) to keep memory sharp.    I have personally reviewed and noted the following in the patient's chart:   . Medical and social history . Use of alcohol, tobacco or illicit drugs  . Current medications and supplements . Functional ability and status . Nutritional status . Physical activity . Advanced directives . List of other physicians . Hospitalizations, surgeries, and ER visits in previous 12 months . Vitals . Screenings to include cognitive, depression, and falls . Referrals and appointments  In addition, I have reviewed and discussed with patient certain preventive protocols, quality metrics, and best practice recommendations. A written personalized care plan for preventive services as well as general preventive health recommendations were provided to patient.     Michiel Cowboy, RN   07/31/2016     Medical screening examination/treatment/procedure(s) were performed by non-physician practitioner and as supervising provider I was immediately available for  consultation/collaboration. I agree with treatment plan. Mauricio Po, FNP

## 2016-07-31 NOTE — Progress Notes (Signed)
Pre visit review using our clinic review tool, if applicable. No additional management support is needed unless otherwise documented below in the visit note. 

## 2016-07-31 NOTE — Assessment & Plan Note (Signed)
Blood pressure remains above goal 140/90 with current medication regimen and no adverse side effects. Increase amlodipine to 7.5 mg daily. Continue current dosage of metoprolol and furosemide. Encouraged to monitor blood pressure at home and follow low-sodium diet. Denies worst headache of life with no symptoms of end organ damage noted on physical exam.

## 2016-07-31 NOTE — Progress Notes (Signed)
Subjective:    Patient ID: Samuel [name], male    DOB: 07-17-1926, 83 y.o.   MRN: [id number]  Chief Complaint  Patient presents with  . Medicare Wellness    HPI:  Samuel [name] is a 83 y.o. male who  has a past medical history of Abdominal aortic aneurysm (HCC) (10/29/2011); Alcohol abuse; Aortic stenosis; Arthritis; CHF (congestive heart failure) (HCC); Chronic lower back pain; Complication of anesthesia; COPD (chronic obstructive pulmonary disease) (HCC); Decreased libido; Depression; DIZZINESS, CHRONIC; Essential hypertension, benign; GERD (gastroesophageal reflux disease); Heart murmur; Perivalvular leak of prosthetic heart valve; S/P TAVR (transcatheter aortic valve replacement) (01/21/2013); Scoliosis/kyphoscoliosis (10/28/2011); and Shortness of breath (10/24/11). and presents today for a follow up office visit.  Hypertension - Currently maintained on amldipine, furosemide, and metoprolol. Reports taking the medications as prescribed and denies adverse side effects. Home blood pressures have been elevated recently. Denies worst headache of life with no symptoms of end organ damage. Working on a low sodium diet.   BP Readings from Last 3 Encounters:  07/31/16 (!) 154/62  07/17/16 (!) 160/68  03/09/16 (!) 148/60     No Known Allergies    Outpatient Medications Prior to Visit  Medication Sig Dispense Refill  . ALPRAZolam (XANAX) 0.5 MG tablet Take 1 tablet (0.5 mg total) by mouth 2 (two) times daily as needed. for anxiety 60 tablet 0  . aspirin EC 325 MG tablet Take 1 tablet (325 mg total) by mouth every other day. 30 tablet 0  . Bioflavonoid Products (ESTER C PO) Take 1,000 mg by mouth daily.    . Cholecalciferol (VITAMIN D-3) 5000 UNITS TABS Take 1 capsule by mouth.    . furosemide (LASIX) 20 MG tablet Take 2 tablets (40 mg total) by mouth daily. 180 tablet 1  . metoprolol succinate (TOPROL-XL) 25 MG 24 hr tablet TAKE 1/2 TABLET BY MOUTH DAILY FOR BLOOD PRESSURE 90 tablet 0   . Multiple Vitamin (MULTIVITAMIN WITH MINERALS) TABS Take 1 tablet by mouth daily. 30 tablet 0  . POTASSIUM GLUCONATE ER PO Take by mouth daily. OTC    . SELENIUM PO Take 1 tablet by mouth daily.    . traMADol (ULTRAM) 50 MG tablet Take 1 tablet by mouth twice daily as needed for pain. 60 tablet 1  . vitamin B-12 (CYANOCOBALAMIN) 100 MCG tablet Take 50 mcg by mouth daily.      Marland Kitchen amLODipine (NORVASC) 5 MG tablet Take 1 tablet (5 mg total) by mouth daily. 30 tablet 1   No facility-administered medications prior to visit.      Review of Systems  Constitutional: Negative for chills and fever.  Eyes:       Negative for changes in vision  Respiratory: Negative for cough, chest tightness and wheezing.   Cardiovascular: Negative for chest pain, palpitations and leg swelling.  Neurological: Negative for dizziness, weakness and light-headedness.      Objective:    BP (!) 154/62   Pulse 67   Resp 20   Ht  (1.803 m)   Wt 150 lb (68 kg)   SpO2 98%   BMI 20.92 kg/m  Nursing note and vital signs reviewed.  Physical Exam  Constitutional: He is oriented to person, place, and time. He appears well-developed and well-nourished. No distress.  Cardiovascular: Normal rate, regular rhythm, normal heart sounds and intact distal pulses.   Pulmonary/Chest: Effort normal and breath sounds normal.  Neurological: He is alert and oriented to person, place, and time.  Skin:  Skin is warm and dry.  Psychiatric: He has a normal mood and affect. His behavior is normal. Judgment and thought content normal.       Assessment & Plan:   Problem List Items Addressed This Visit      Cardiovascular and Mediastinum   Essential hypertension, benign    Blood pressure remains above goal 140/90 with current medication regimen and no adverse side effects. Increase amlodipine to 7.5 mg daily. Continue current dosage of metoprolol and furosemide. Encouraged to monitor blood pressure at home and follow  low-sodium diet. Denies worst headache of life with no symptoms of end organ damage noted on physical exam.      Relevant Medications   amLODipine (NORVASC) 5 MG tablet     Other   Medicare annual wellness visit, subsequent - Primary       I have discontinued Mr. Gaunce's amLODipine. I have also changed his amLODipine. Additionally, I am having him maintain his vitamin B-12, multivitamin with minerals, SELENIUM PO, Bioflavonoid Products (ESTER C PO), Vitamin D-3, POTASSIUM GLUCONATE ER PO, aspirin EC, furosemide, metoprolol succinate, ALPRAZolam, traMADol, and magnesium (amino acid chelate).   Meds ordered this encounter  Medications  . Specialty Vitamins Products (MAGNESIUM, AMINO ACID CHELATE,) 133 MG tablet    Sig: Take 1 tablet by mouth 2 (two) times daily.  Marland Kitchen DISCONTD: amLODipine (NORVASC) 10 MG tablet    Sig: Take 1 tablet (10 mg total) by mouth daily.    Dispense:  30 tablet    Refill:  1    Please change dosage of amlodipine to 10 mg.    Order Specific Question:   Supervising Provider    Answer:   Hillard Danker A [[id number]]  . amLODipine (NORVASC) 5 MG tablet    Sig: Take 1.5 tablets (7.5 mg total) by mouth daily.    Dispense:  45 tablet    Refill:  1    Please discontinue previous order for amlodipine.    Order Specific Question:   Supervising Provider    Answer:   Hillard Danker A [[id number]]     Follow-up: Return in about 1 month (around 08/30/2016), or if symptoms worsen or fail to improve.  Jeanine Luz, FNP

## 2016-08-11 ENCOUNTER — Other Ambulatory Visit: Payer: Self-pay | Admitting: Family

## 2016-08-17 ENCOUNTER — Other Ambulatory Visit: Payer: Self-pay | Admitting: Family

## 2016-08-17 NOTE — Telephone Encounter (Signed)
Last refill was 07/17/16 according to [facility]

## 2016-08-17 NOTE — Telephone Encounter (Signed)
Faxed

## 2016-09-19 ENCOUNTER — Telehealth: Payer: Self-pay | Admitting: Family

## 2016-09-19 NOTE — Telephone Encounter (Signed)
Noted  

## 2016-09-19 NOTE — Telephone Encounter (Signed)
Pt came by office to drop off SCAT forms for completion and stated he needs a refill on his Tramadol.  Once I looked pt up I noticed he also needed to f/u with Tammy SoursGreg by May 30 for BP from AVS from visit on 07/31/16.  Pt did not realize he needed to make a f/u and I made a f/u appt for him for this week.

## 2016-09-20 ENCOUNTER — Other Ambulatory Visit: Payer: Self-pay | Admitting: Family

## 2016-09-20 NOTE — Telephone Encounter (Signed)
Routing to greg, please advise, thanks 

## 2016-09-21 ENCOUNTER — Ambulatory Visit (INDEPENDENT_AMBULATORY_CARE_PROVIDER_SITE_OTHER): Payer: Medicare Other | Admitting: Family

## 2016-09-21 ENCOUNTER — Other Ambulatory Visit: Payer: Self-pay | Admitting: Family

## 2016-09-21 ENCOUNTER — Encounter: Payer: Self-pay | Admitting: Family

## 2016-09-21 VITALS — BP 142/68 | HR 65 | Temp 98.0°F | Ht 71.0 in | Wt 150.0 lb

## 2016-09-21 DIAGNOSIS — I10 Essential (primary) hypertension: Secondary | ICD-10-CM

## 2016-09-21 MED ORDER — METOPROLOL SUCCINATE ER 25 MG PO TB24: ORAL_TABLET | ORAL | 0 refills | Status: DC

## 2016-09-21 MED ORDER — TRAMADOL HCL 50 MG PO TABS: ORAL_TABLET | ORAL | 1 refills | Status: DC

## 2016-09-21 NOTE — Patient Instructions (Signed)
Thank you for choosing [facility].  SUMMARY AND INSTRUCTIONS:  Please continue to take your medication as prescribed.   Medication:  Your prescription(s) have been submitted to your pharmacy or been printed and provided for you. Please take as directed and contact our office if you believe you are having problem(s) with the medication(s) or have any questions.  Follow up:  If your symptoms worsen or fail to improve, please contact our office for further instruction, or in case of emergency go directly to the emergency room at the closest medical facility.     

## 2016-09-21 NOTE — Assessment & Plan Note (Signed)
Blood pressure appears adequately controlled with current medication regimen and no adverse side effects. No new symptoms of end organ damage noted on physical exam. Continue current dosage of amlodipine and metoprolol. Encouraged to monitor blood pressure at home and follow low sodium diet. Continue to monitor.

## 2016-09-21 NOTE — Telephone Encounter (Signed)
Routing to greg, please advise, thanks 

## 2016-09-21 NOTE — Telephone Encounter (Signed)
Please have patient check as he should have an additional refill left.

## 2016-09-21 NOTE — Progress Notes (Signed)
Subjective:    Patient ID: Samuel [name], male    DOB: Aug 22, 1926, 83 y.o.   MRN: [id number]  Chief Complaint  Patient presents with  . Follow-up    B/P    HPI:  Samuel [name] is a 83 y.o. male who  has a past medical history of Abdominal aortic aneurysm ([facility]) (10/29/2011); Alcohol abuse; Aortic stenosis; Arthritis; CHF (congestive heart failure) ([facility]); Chronic lower back pain; Complication of anesthesia; COPD (chronic obstructive pulmonary disease) ([facility]); Decreased libido; Depression; DIZZINESS, CHRONIC; Essential hypertension, benign; GERD (gastroesophageal reflux disease); Heart murmur; Perivalvular leak of prosthetic heart valve; S/P TAVR (transcatheter aortic valve replacement) (01/21/2013); Scoliosis/kyphoscoliosis (10/28/2011); and Shortness of breath (10/24/11). and presents today for a follow up office visit.  1.) Hypertension - Currently maintained on amlodipine, furosemide, and metoprolol. Reports taking medication as prescribed and denies adverse side effects or hypotensive readings. Blood pressures at home have remained well controlled. Denies worst headache of life with no new symptoms of end organ damage. Continues to work on following a low sodium diet.  BP Readings from Last 3 Encounters:  09/21/16 (!) 142/68  07/31/16 (!) 154/62  07/17/16 (!) 160/68     No Known Allergies    Outpatient Medications Prior to Visit  Medication Sig Dispense Refill  . ALPRAZolam (XANAX) 0.5 MG tablet take 1 TABLET BY MOUTH TWICE DAILY AS NEEDED for anxiety 60 tablet 1  . amLODipine (NORVASC) 5 MG tablet Take 1.5 tablets (7.5 mg total) by mouth daily. 45 tablet 1  . aspirin EC 325 MG tablet Take 1 tablet (325 mg total) by mouth every other day. 30 tablet 0  . Bioflavonoid Products (ESTER C PO) Take 1,000 mg by mouth daily.    . Cholecalciferol (VITAMIN D-3) 5000 UNITS TABS Take 1 capsule by mouth.    . furosemide (LASIX) 20 MG tablet Take 2 tablets (40 mg total) by mouth daily. 180  tablet 1  . Multiple Vitamin (MULTIVITAMIN WITH MINERALS) TABS Take 1 tablet by mouth daily. 30 tablet 0  . POTASSIUM GLUCONATE ER PO Take by mouth daily. OTC    . SELENIUM PO Take 1 tablet by mouth daily.    Marland Kitchen Specialty Vitamins Products (MAGNESIUM, AMINO ACID CHELATE,) 133 MG tablet Take 1 tablet by mouth 2 (two) times daily.    . vitamin B-12 (CYANOCOBALAMIN) 100 MCG tablet Take 50 mcg by mouth daily.      Marland Kitchen amLODipine (NORVASC) 5 MG tablet Take 1 tablet (5 mg total) by mouth daily. 90 tablet 1  . metoprolol succinate (TOPROL-XL) 25 MG 24 hr tablet TAKE 1/2 TABLET BY MOUTH DAILY FOR BLOOD PRESSURE 90 tablet 0  . traMADol (ULTRAM) 50 MG tablet Take 1 tablet by mouth twice daily as needed for pain. 60 tablet 1   No facility-administered medications prior to visit.      Review of Systems  Constitutional: Negative for chills and fever.  Eyes:       Negative for changes in vision  Respiratory: Negative for cough, chest tightness and wheezing.   Cardiovascular: Negative for chest pain, palpitations and leg swelling.  Neurological: Negative for dizziness, weakness and light-headedness.      Objective:    BP (!) 142/68 (BP Location: Left Arm, Patient Position: Sitting, Cuff Size: Normal)   Pulse 65   Temp 98 F (36.7 C) (Oral)   Ht 5\' 11"  (1.803 m)   Wt 150 lb (68 kg)   SpO2 96%   BMI 20.92 kg/m  Nursing  note and vital signs reviewed.  Physical Exam  Constitutional: He is oriented to person, place, and time. He appears well-developed and well-nourished. No distress.  Cardiovascular: Normal rate, regular rhythm, normal heart sounds and intact distal pulses.   Pulmonary/Chest: Effort normal and breath sounds normal.  Neurological: He is alert and oriented to person, place, and time.  Skin: Skin is warm and dry.  Psychiatric: He has a normal mood and affect. His behavior is normal. Judgment and thought content normal.       Assessment & Plan:   Problem List Items Addressed  This Visit      Cardiovascular and Mediastinum   Essential hypertension, benign - Primary    Blood pressure appears adequately controlled with current medication regimen and no adverse side effects. No new symptoms of end organ damage noted on physical exam. Continue current dosage of amlodipine and metoprolol. Encouraged to monitor blood pressure at home and follow low sodium diet. Continue to monitor.       Relevant Medications   metoprolol succinate (TOPROL-XL) 25 MG 24 hr tablet       I am having Mr. Swiney maintain his vitamin B-12, muMarga Hootsltivitamin with minerals, SELENIUM PO, Bioflavonoid Products (ESTER C PO), Vitamin D-3, POTASSIUM GLUCONATE ER PO, aspirin EC, furosemide, magnesium (amino acid chelate), amLODipine, ALPRAZolam, metoprolol succinate, and traMADol.   Meds ordered this encounter  Medications  . metoprolol succinate (TOPROL-XL) 25 MG 24 hr tablet    Sig: TAKE 1/2 TABLET BY MOUTH DAILY FOR BLOOD PRESSURE    Dispense:  90 tablet    Refill:  0    Order Specific Question:   Supervising Provider    Answer:   Hillard DankerRAWFORD, ELIZABETH A [[id number]]  . traMADol (ULTRAM) 50 MG tablet    Sig: Take 1 tablet by mouth twice daily as needed for pain.    Dispense:  60 tablet    Refill:  1    Order Specific Question:   Supervising Provider    Answer:   Hillard DankerRAWFORD, ELIZABETH A [[id number]]     Follow-up: Return in about 3 months (around 12/22/2016), or if symptoms worsen or fail to improve.  Jeanine Luzalone, Jorell Agne, FNP

## 2016-10-06 ENCOUNTER — Telehealth: Payer: Self-pay | Admitting: *Deleted

## 2016-10-06 MED ORDER — METOPROLOL SUCCINATE ER 25 MG PO TB24: ORAL_TABLET | ORAL | 0 refills | Status: DC

## 2016-10-06 NOTE — Telephone Encounter (Signed)
Rec'd call pt states he miss and spill his metoprolol succinate into dish water. Pharmacy will not filled rx on file bcz it would be to early for insurance. [facility]. Sent temporary rx to pharmacy.Marland Kitchen.Raechel Chute/lmb

## 2016-10-10 NOTE — Telephone Encounter (Signed)
Called pt to let him know that I have re faxed his SCAT forms. Pt also asked to cancel his appt on 10/12/16 with Tammy SoursGreg bc his daughter will be out of town. Pt will call back to reschedule.

## 2016-10-10 NOTE — Telephone Encounter (Signed)
Pt called checking on these forms. He said that he has not heard anything from it. Have they been completed?

## 2016-10-12 ENCOUNTER — Ambulatory Visit: Payer: Medicare Other | Admitting: Family

## 2016-10-18 ENCOUNTER — Other Ambulatory Visit: Payer: Self-pay | Admitting: Family

## 2016-10-18 NOTE — Telephone Encounter (Signed)
Rx faxed

## 2016-11-16 ENCOUNTER — Telehealth: Payer: Self-pay | Admitting: *Deleted

## 2016-11-16 MED ORDER — ALPRAZOLAM 0.5 MG PO TABS: 0.5000 mg | ORAL_TABLET | Freq: Two times a day (BID) | ORAL | 1 refills | Status: DC | PRN

## 2016-11-16 MED ORDER — TRAMADOL HCL 50 MG PO TABS: 50.0000 mg | ORAL_TABLET | Freq: Two times a day (BID) | ORAL | 0 refills | Status: DC | PRN

## 2016-11-16 NOTE — Telephone Encounter (Signed)
Patient called and requested to have his prescription for xanax and tramadol refilled. Stating that he uses  xanax to help with his breathing and is concerned he may run out. He [facility]  Patient also wanted to know if he could get a HH aide to come to assist him with baths and other ADLs.

## 2016-11-16 NOTE — Telephone Encounter (Signed)
NCCSD reviewed with no irregularities. Medications refilled.

## 2016-11-16 NOTE — Telephone Encounter (Signed)
Called and LVM that prescription were faxed to [facility].

## 2016-11-21 ENCOUNTER — Emergency Department (HOSPITAL_COMMUNITY): Payer: Medicare Other

## 2016-11-21 ENCOUNTER — Encounter (HOSPITAL_COMMUNITY): Payer: Self-pay | Admitting: Nurse Practitioner

## 2016-11-21 ENCOUNTER — Emergency Department (HOSPITAL_COMMUNITY)
Admission: EM | Admit: 2016-11-21 | Discharge: 2016-11-22 | Disposition: A | Discharge status: Home / Self Care | Payer: Medicare Other | Attending: Emergency Medicine | Admitting: Emergency Medicine

## 2016-11-21 DIAGNOSIS — R110 Nausea: Secondary | ICD-10-CM | POA: Insufficient documentation

## 2016-11-21 DIAGNOSIS — I509 Heart failure, unspecified: Secondary | ICD-10-CM | POA: Diagnosis not present

## 2016-11-21 DIAGNOSIS — Z79899 Other long term (current) drug therapy: Secondary | ICD-10-CM | POA: Diagnosis not present

## 2016-11-21 DIAGNOSIS — R109 Unspecified abdominal pain: Secondary | ICD-10-CM

## 2016-11-21 DIAGNOSIS — R1031 Right lower quadrant pain: Secondary | ICD-10-CM | POA: Diagnosis present

## 2016-11-21 DIAGNOSIS — J449 Chronic obstructive pulmonary disease, unspecified: Secondary | ICD-10-CM | POA: Insufficient documentation

## 2016-11-21 DIAGNOSIS — Z87891 Personal history of nicotine dependence: Secondary | ICD-10-CM | POA: Diagnosis not present

## 2016-11-21 DIAGNOSIS — I110 Hypertensive heart disease with heart failure: Secondary | ICD-10-CM | POA: Insufficient documentation

## 2016-11-21 DIAGNOSIS — Z7982 Long term (current) use of aspirin: Secondary | ICD-10-CM | POA: Diagnosis not present

## 2016-11-21 LAB — COMPREHENSIVE METABOLIC PANEL
ALT: 19 U/L (ref 17–63)
AST: 35 U/L (ref 15–41)
Albumin: 3.7 g/dL (ref 3.5–5.0)
Alkaline Phosphatase: 75 U/L (ref 38–126)
Anion gap: 11 (ref 5–15)
BUN: 12 mg/dL (ref 6–20)
CO2: 25 mmol/L (ref 22–32)
Calcium: 9.1 mg/dL (ref 8.9–10.3)
Chloride: 104 mmol/L (ref 101–111)
Creatinine, Ser: 1.27 mg/dL — ABNORMAL HIGH (ref 0.61–1.24)
GFR calc Af Amer: 57 mL/min — ABNORMAL LOW (ref >60)
GFR calc non Af Amer: 49 mL/min — ABNORMAL LOW (ref >60)
Glucose, Bld: 108 mg/dL — ABNORMAL HIGH (ref 65–99)
Potassium: 4.3 mmol/L (ref 3.5–5.1)
Sodium: 140 mmol/L (ref 135–145)
Total Bilirubin: 0.7 mg/dL (ref 0.3–1.2)
Total Protein: 7.5 g/dL (ref 6.5–8.1)

## 2016-11-21 LAB — URINALYSIS, ROUTINE W REFLEX MICROSCOPIC
Bilirubin Urine: NEGATIVE
Glucose, UA: NEGATIVE mg/dL
Hgb urine dipstick: NEGATIVE
Ketones, ur: NEGATIVE mg/dL
Leukocytes, UA: NEGATIVE
Nitrite: NEGATIVE
Protein, ur: NEGATIVE mg/dL
Specific Gravity, Urine: 1.004 — ABNORMAL LOW (ref 1.005–1.030)
pH: 7 (ref 5.0–8.0)

## 2016-11-21 LAB — CBC
HCT: 42.7 % (ref 39.0–52.0)
Hemoglobin: 14.6 g/dL (ref 13.0–17.0)
MCH: 31.9 pg (ref 26.0–34.0)
MCHC: 34.2 g/dL (ref 30.0–36.0)
MCV: 93.2 fL (ref 78.0–100.0)
Platelets: 148 10*3/uL — ABNORMAL LOW (ref 150–400)
RBC: 4.58 MIL/uL (ref 4.22–5.81)
RDW: 12.8 % (ref 11.5–15.5)
WBC: 6 10*3/uL (ref 4.0–10.5)

## 2016-11-21 LAB — LIPASE, BLOOD: LIPASE: 35 U/L (ref 11–51)

## 2016-11-21 MED ORDER — SODIUM CHLORIDE 0.9 % IV SOLN
INTRAVENOUS | Status: DC
  Administered 2016-11-21: 23:00:00 via INTRAVENOUS

## 2016-11-21 MED ORDER — DICYCLOMINE HCL 20 MG PO TABS: 20.0000 mg | ORAL_TABLET | Freq: Four times a day (QID) | ORAL | 0 refills | Status: DC | PRN

## 2016-11-21 MED ORDER — HYDROMORPHONE HCL 1 MG/ML IJ SOLN
0.7500 mg | Freq: Once | INTRAMUSCULAR | Status: AC
  Administered 2016-11-21: 0.75 mg via INTRAVENOUS
  Filled 2016-11-21: qty 1

## 2016-11-21 MED ORDER — IOPAMIDOL (ISOVUE-300) INJECTION 61%
INTRAVENOUS | Status: AC
  Administered 2016-11-21: 100 mL
  Filled 2016-11-21: qty 100

## 2016-11-21 MED ORDER — OXYCODONE-ACETAMINOPHEN 5-325 MG PO TABS: 1.0000 | ORAL_TABLET | ORAL | 0 refills | Status: DC | PRN

## 2016-11-21 MED ORDER — ONDANSETRON HCL 4 MG/2ML IJ SOLN
4.0000 mg | Freq: Once | INTRAMUSCULAR | Status: AC
  Administered 2016-11-21: 4 mg via INTRAVENOUS
  Filled 2016-11-21: qty 2

## 2016-11-21 NOTE — ED Notes (Signed)
Pt family member upset bc she is ready to go bc of work in the morning. Explained that pt is still scheduled for a CT scan and if workup is reassuring then he would be DC but we must wait for CT scan. Pt family member continues to be upset. Pt is pleasant.

## 2016-11-21 NOTE — ED Triage Notes (Signed)
Pt presents with c/o RLQ pain. Then pain began about a week ago and has been intermittent since. The pain fluctuates between a dull ache and sharp shooting pain. Pt reports nausea, fatigue, urinary urgency. Pt denies fevers, vomiting, urinary pain, constipation, diarrhea.

## 2016-11-22 ENCOUNTER — Telehealth: Payer: Self-pay | Admitting: *Deleted

## 2016-11-22 NOTE — Telephone Encounter (Signed)
Called patient back in response to him leaving nurse VM to return call. Patient states he was in the ED 11/21/16 due to abdominal pain and nausea. He was discharge to home. Patient wants to discuss ED visit and needs for [facility] assistance with PCP. Nurse scheduled an office visit on 11/27/16.

## 2016-11-22 NOTE — ED Notes (Signed)
This RN wasted 0.25 Dilaudid with [place]. Pt was already DC out of pyxis before this RN could waste.

## 2016-11-26 NOTE — Telephone Encounter (Signed)
Noted  

## 2016-11-27 ENCOUNTER — Encounter: Payer: Self-pay | Admitting: Family

## 2016-11-27 ENCOUNTER — Ambulatory Visit (INDEPENDENT_AMBULATORY_CARE_PROVIDER_SITE_OTHER): Payer: Medicare Other | Admitting: Family

## 2016-11-27 VITALS — BP 160/62 | HR 67 | Temp 99.0°F | Resp 18 | Ht 71.0 in | Wt 146.0 lb

## 2016-11-27 DIAGNOSIS — I714 Abdominal aortic aneurysm, without rupture, unspecified: Secondary | ICD-10-CM

## 2016-11-27 DIAGNOSIS — R2689 Other abnormalities of gait and mobility: Secondary | ICD-10-CM

## 2016-11-27 DIAGNOSIS — R110 Nausea: Secondary | ICD-10-CM

## 2016-11-27 DIAGNOSIS — R1031 Right lower quadrant pain: Secondary | ICD-10-CM | POA: Diagnosis not present

## 2016-11-27 MED ORDER — OXYCODONE-ACETAMINOPHEN 5-325 MG PO TABS: 1.0000 | ORAL_TABLET | ORAL | 0 refills | Status: DC | PRN

## 2016-11-27 MED ORDER — ONDANSETRON 4 MG PO TBDP: 4.0000 mg | ORAL_TABLET | Freq: Three times a day (TID) | ORAL | 0 refills | Status: AC | PRN

## 2016-11-27 NOTE — Patient Instructions (Addendum)
Thank you for choosing [facility].  SUMMARY AND INSTRUCTIONS:  We will complete the SCAT paperwork and send it to them.   Please contact Dr. Darcella Cheshire office for folllow up of your aneursym.  A referral to the gastroenterologist (stomach doctor)  Medication:  Your prescription(s) have been submitted to your pharmacy or been printed and provided for you. Please take as directed and contact our office if you believe you are having problem(s) with the medication(s) or have any questions.  Referrals:  Referrals have been made during this visit. You should expect to hear back from our schedulers in about 7-10 days in regards to establishing an appointment with the specialists we discussed.   Follow up:  If your symptoms worsen or fail to improve, please contact our office for further instruction, or in case of emergency go directly to the emergency room at the closest medical facility.

## 2016-11-27 NOTE — Assessment & Plan Note (Signed)
Continues to have nausea with no vomiting. Previous CT scan reviewed and negative for significant pathology to explain his symptoms. Unlikely medicine origin. Zofran as needed for nausea. No red flag symptoms currently. Continue to monitor.

## 2016-11-27 NOTE — Assessment & Plan Note (Signed)
Continues to experience right lower quadrant pain with no significant findings on recent CT exam. Indicates adequate bowel movements with constipation being unlikely. Continue current dosage of oxycodone-acetaminophen using only as needed for breakthrough pain. Referral to GI placed. Start ondansetron as need for nausea. Continue to monitor and follow up pending referral.

## 2016-11-27 NOTE — Assessment & Plan Note (Signed)
AAA by CT with mild increase in size with the recommended follow up CT scan in 2-6 months. Advised to follow up with Dr. Darcella Cheshire office of vascular surgery for monitoring.

## 2016-11-27 NOTE — Assessment & Plan Note (Signed)
Decreased mobility in the setting of previous aortic stenosis and aortic valve repair in combination with orthopedic diagnosis of scoliosis. Continue ambulation with walker. Patient is a good candidate for assistance with transportation with SCAT form completed. He appears too functional for home health therapies. Continue to monitor.

## 2016-11-27 NOTE — Progress Notes (Signed)
Subjective:    Patient ID: Samuel [name], male    DOB: [birth year], 83 y.o.   MRN: [id number]  Chief Complaint  Patient presents with  . Follow-up    HPI:  Samuel [name] is a 83 y.o. 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 and presents today for a follow up.   1.) Abdominal pain - Was recently seen in the ED for pain located in his abdomen with concern for his appendix. A CT scan was completed and showed a infrarenal aortic aneurysm which increased in size with the recommendation for follow up with vascular surgery to establish. Since leaving the ED he reports that his abdominal pain has gradually improved. He was prescribed hydrocodone-acetaminophen as needed for pain and he has been taking 1/2 tablet as needed. He does continue to have nausea without vomiting.   2.) Mobility - Continues to have mobility issues and decreased physical endurance secondary to previous history of aortic stenosis with a history of aortic valve repair as well as scoliosis of his back. He is able to ambulate with the assistance of a walker, but not able to walk long distances. Requesting SCAT paperwork to be completed to help with transportation assistance.   No Known Allergies    Outpatient Medications Prior to Visit  Medication Sig Dispense Refill  . ALPRAZolam (XANAX) 0.5 MG tablet Take 1 tablet (0.5 mg total) by mouth 2 (two) times daily as needed. for anxiety 60  tablet 1  . amLODipine (NORVASC) 5 MG tablet Take 1.5 tablets (7.5 mg total) by mouth daily. 45 tablet 1  . aspirin EC 325 MG tablet Take 1 tablet (325 mg total) by mouth every other day. 30 tablet 0  . Bioflavonoid Products (ESTER C PO) Take 1,000 mg by mouth daily.    . Cholecalciferol (VITAMIN D-3) 5000 UNITS TABS Take 1 capsule by mouth.    . furosemide (LASIX) 20 MG tablet Take 2 tablets (40 mg total) by mouth daily. 180 tablet 1  . metoprolol succinate (TOPROL-XL) 25 MG 24 hr tablet TAKE 1/2 TABLET BY MOUTH DAILY FOR BLOOD PRESSURE 5 tablet 0  . Multiple Vitamin (MULTIVITAMIN WITH MINERALS) TABS Take 1 tablet by mouth daily. 30 tablet 0  . POTASSIUM GLUCONATE ER PO Take by mouth daily. OTC    . SELENIUM PO Take 1 tablet by mouth daily.    Marland Kitchen Specialty Vitamins Products (MAGNESIUM, AMINO ACID CHELATE,) 133 MG tablet Take 1 tablet by mouth 2 (two) times daily.    . traMADol (ULTRAM) 50 MG tablet Take 1 tablet (50 mg total) by mouth 2 (two) times daily as needed. for pain 60 tablet 0  . vitamin B-12 (CYANOCOBALAMIN) 100 MCG tablet Take 50 mcg by mouth daily.      Marland Kitchen oxyCODONE-acetaminophen (PERCOCET/ROXICET) 5-325 MG tablet Take 1 tablet by mouth every 4 (four) hours as needed for severe pain. 10 tablet 0   No facility-administered medications prior to visit.       Past Surgical History:  Procedure Laterality Date  . INTRAOPERATIVE TRANSESOPHAGEAL ECHOCARDIOGRAM N/A 01/21/2013   Procedure: INTRAOPERATIVE TRANSESOPHAGEAL ECHOCARDIOGRAM;  Surgeon: Valentina Gu  Roxy Manns, MD;  Location: [place];  Service: Open Heart Surgery;  Laterality: N/A;  . LEFT AND RIGHT HEART CATHETERIZATION WITH CORONARY ANGIOGRAM N/A 10/27/2011   Procedure: LEFT AND RIGHT HEART CATHETERIZATION WITH CORONARY ANGIOGRAM;  Surgeon: Hillary Bow, MD;  Location[facility] CATH LAB;  Service: Cardiovascular;  Laterality: N/A;  . NO PAST SURGERIES    . THORACOTOMY Right 01/21/2013   Procedure: MINI/LIMITED THORACOTOMY;  Surgeon:  Rexene Alberts, MD;  Location: [place];  Service: Open Heart Surgery;  Laterality: Right;  . TRANSCATHETER AORTIC VALVE REPLACEMENT, TRANSAORTIC N/A 01/21/2013   Procedure: TRANSCATHETER AORTIC VALVE REPLACEMENT, TRANSAORTIC;  Surgeon: Rexene Alberts, MD;  Location: [place];  Service: Open Heart Surgery;  Laterality: N/A;      Past Medical History:  Diagnosis Date  . Abdominal aortic aneurysm ([place]) 10/29/2011   3.7cm AAA by CT scan with unusual appearance suggestive of penetrating atherosclerotic ulcer or localized dissection  . Alcohol abuse   . Aortic stenosis    Echo 7/14:  Severe LVH, EF 55-60%, severe AS, mean gradient 78 mmHg, MAC, mild LAE  . Arthritis    "in the low vertebra"  . CHF (congestive heart failure) ([place])   . Chronic lower back pain   . Complication of anesthesia    NO NECK LINES ON RIGHT FOR SURGERY  . COPD (chronic obstructive pulmonary disease) ([place])   . Decreased libido   . Depression   . DIZZINESS, CHRONIC   . Essential hypertension, benign   . GERD (gastroesophageal reflux disease)    occ  . Heart murmur   . Perivalvular leak of prosthetic heart valve    echo (02/21/13): Mild LVH, EF 55-60%, aortic valve with mild to moderate perivalvular leak, AI, mean aortic valve gradient 6 mm of mercury, MAC.  . 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 Scoliosis/kyphoscoliosis 10/28/2011  . Shortness of breath 10/24/11   "all the time"      Review of Systems  Constitutional: Negative for chills, fatigue, fever and unexpected weight change.  Respiratory: Negative for chest tightness, shortness of breath and wheezing.   Cardiovascular: Negative for chest pain, palpitations and leg swelling.  Gastrointestinal: Positive for abdominal pain and nausea. Negative for blood in stool, constipation, diarrhea, rectal pain and  vomiting.  Neurological: Positive for weakness. Negative for numbness.      Objective:    BP (!) 160/62 (BP Location: Left Arm, Patient Position: Sitting, Cuff Size: Normal)   Pulse 67   Temp 99 F (37.2 C) (Oral)   Resp 18   Ht _0  (1.803 m)   Wt 146 lb (66.2 kg)   SpO2 93%   BMI 20.36 kg/m  Nursing note and vital signs reviewed.  Physical Exam  Constitutional: He is oriented to person, place, and time. He appears well-developed and well-nourished. No distress.  Cardiovascular: Normal rate, regular rhythm, normal heart sounds and intact distal pulses.   Pulmonary/Chest: Effort normal and breath sounds normal.  Abdominal: Normal appearance and bowel sounds are normal. He exhibits no abdominal bruit and no mass. There is no hepatosplenomegaly. There is tenderness in the right lower quadrant. There is no rigidity, no guarding, no tenderness at McBurney's point and negative Murphy's sign.  Neurological: He is alert and oriented to person, place, and time.  Skin: Skin is warm and dry.  Psychiatric: He has a normal  mood and affect. His behavior is normal. Judgment and thought content normal.       Assessment & Plan:   Problem List Items Addressed This Visit      Cardiovascular and Mediastinum   Abdominal aortic aneurysm ([place]) (Chronic)    AAA by CT with mild increase in size with the recommended follow up CT scan in 2-6 months. Advised to follow up with Dr. Claretha Cooper office of vascular surgery for monitoring.         Other   RLQ PAIN    Continues to experience right lower quadrant pain with no significant findings on recent CT exam. Indicates adequate bowel movements with constipation being unlikely. Continue current dosage of oxycodone-acetaminophen using only as needed for breakthrough pain. Referral to GI placed. Start ondansetron as need for nausea. Continue to monitor and follow up pending referral.       Relevant Orders   Ambulatory referral to Gastroenterology   Nausea  without vomiting - Primary    Continues to have nausea with no vomiting. Previous CT scan reviewed and negative for significant pathology to explain his symptoms. Unlikely medicine origin. Zofran as needed for nausea. No red flag symptoms currently. Continue to monitor.       Relevant Orders   Ambulatory referral to Gastroenterology   Decreased mobility    Decreased mobility in the setting of previous aortic stenosis and aortic valve repair in combination with orthopedic diagnosis of scoliosis. Continue ambulation with walker. Patient is a good candidate for assistance with transportation with SCAT form completed. He appears too functional for home health therapies. Continue to monitor.           I am having Mr. Dozal start on ondansetron. I am also having him maintain his vitamin B-12, multivitamin with minerals, SELENIUM PO, Bioflavonoid Products (ESTER C PO), Vitamin D-3, POTASSIUM GLUCONATE ER PO, aspirin EC, furosemide, magnesium (amino acid chelate), amLODipine, metoprolol succinate, ALPRAZolam, traMADol, and oxyCODONE-acetaminophen.  ssssssss Meds ordered this encounter  Medications  . ondansetron (ZOFRAN-ODT) 4 MG disintegrating tablet    Sig: Take 1 tablet (4 mg total) by mouth every 8 (eight) hours as needed for nausea or vomiting.    Dispense:  40 tablet    Refill:  0    Order Specific Question:   Supervising Provider    Answer:   Pricilla Holm A [[id number]]  . oxyCODONE-acetaminophen (PERCOCET/ROXICET) 5-325 MG tablet    Sig: Take 1 tablet by mouth every 4 (four) hours as needed for severe pain.    Dispense:  10 tablet    Refill:  0    Order Specific Question:   Supervising Provider    Answer:   Pricilla Holm A [[id number]]     Follow-up: Return in about 2 months (around 01/27/2017), or if symptoms worsen or fail to improve.  Mauricio Po, FNP

## 2016-12-02 ENCOUNTER — Other Ambulatory Visit: Payer: Self-pay | Admitting: Cardiology

## 2016-12-02 DIAGNOSIS — Z952 Presence of prosthetic heart valve: Secondary | ICD-10-CM

## 2016-12-08 ENCOUNTER — Telehealth: Payer: Self-pay | Admitting: Family

## 2016-12-08 MED ORDER — ALPRAZOLAM 0.5 MG PO TABS: 0.5000 mg | ORAL_TABLET | Freq: Two times a day (BID) | ORAL | 0 refills | Status: DC | PRN

## 2016-12-08 NOTE — Telephone Encounter (Signed)
Pt called asking for a refill on his ALPRAZolam (XANAX) 0.5 MG tablet. He said that he knows it is early but he lost a few of them and is out. He said that he only takes them when he needs them and he will be very cautious with them this time. He said "I am pleading with him to please refill this for me". The pt uses [facility] on [address].

## 2016-12-08 NOTE — Telephone Encounter (Signed)
Medication refilled. Will not refill medications if lost in the future or if medications are not used as prescribed.

## 2016-12-08 NOTE — Telephone Encounter (Signed)
Last refill was 11/16/16 per [facility]

## 2016-12-11 NOTE — Telephone Encounter (Signed)
LVM letting pt know.  

## 2016-12-11 NOTE — Telephone Encounter (Signed)
Patient states [facility] at [address] has not received script.  Is requesting script to be sent as soon as possible.  States breathing has not been good.  States that xanax is the only thing that helps.  States he does believe he needs to be on oxygen.  Patient states he will call back to make appt for this.  States he has been sick and nausea without med as well.  Patient would like a call back in regard as well.

## 2016-12-11 NOTE — Telephone Encounter (Signed)
Medication has been called in to pharmacy.  

## 2016-12-11 NOTE — ED Provider Notes (Signed)
[facility] DEPT Provider Note   CSN: [id number] Arrival date & time: 11/21/16  1529     History   Chief Complaint Chief Complaint  Patient presents with  . Abdominal Pain    HPI Samuel [name] is a 83 y.o. male.  HPI   83 year old male with abdominal pain. Right lower quadrant. Onset about a week ago. Waxes and wanes without any significant exacerbating relieving factors. Sometimes sharp and sometimes dull. Nausea. No vomiting. No urinary complaints. No diarrhea or other changes problems.  Past Medical History:  Diagnosis Date  . Abdominal aortic aneurysm ([place]) 10/29/2011   3.7cm AAA by CT scan with unusual appearance suggestive of penetrating atherosclerotic ulcer or localized dissection  . Alcohol abuse   . Aortic stenosis    Echo 7/14:  Severe LVH, EF 55-60%, severe AS, mean gradient 78 mmHg, MAC, mild LAE  . Arthritis    "in the low vertebra"  . CHF (congestive heart failure) ([place])   . Chronic lower back pain   . Complication of anesthesia    NO NECK LINES ON RIGHT FOR SURGERY  . COPD (chronic obstructive pulmonary disease) ([place])   . Decreased libido   . Depression   . DIZZINESS, CHRONIC   . Essential hypertension, benign   . GERD (gastroesophageal reflux disease)    occ  . Heart murmur   . Perivalvular leak of prosthetic heart valve    echo (02/21/13): Mild LVH, EF 55-60%, aortic valve with mild to moderate perivalvular leak, AI, mean aortic valve gradient 6 mm of mercury, MAC.  . 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 Scoliosis/kyphoscoliosis 10/28/2011  . Shortness of breath 10/24/11   "all the time"    Patient Active Problem List   Diagnosis Date Noted  . Decreased mobility 11/27/2016  . Medicare annual wellness visit, subsequent 07/31/2016  . Acute upper respiratory infection 03/09/2016  . Nausea  without vomiting 04/06/2015  . Protein-calorie malnutrition, severe ([place]) 02/24/2015  . Dehydration 02/23/2015  . H/O aortic valve replacement 04/14/2013  . Osteoarthritis of right hip 01/29/2013  . 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 Aortic stenosis, severe 11/20/2011  . Abdominal aortic aneurysm ([place]) 10/29/2011  . Scoliosis/kyphoscoliosis 10/28/2011  . Alcohol intoxication ([place]) 10/24/2011  . Physical deconditioning 10/24/2011  . Generalized weakness 10/24/2011  . Altered mental status 10/24/2011  . Fever and chills 10/24/2011  . Dysphagia 10/24/2011  . Essential hypertension, benign 03/04/2010  . AORTIC STENOSIS 09/03/2008  . COPD bronchitis 09/03/2008  . DEPRESSION 07/19/2007  . CELLULITIS AND ABSCESS OF FOOT EXCEPT TOES 07/19/2007  . DIZZINESS, CHRONIC 07/11/2007  . DECREASED LIBIDO 07/11/2007  . Alcohol abuse 07/08/2007  . HEADACHE 07/08/2007  . HEART MURMUR 07/11/2006  . DYSPNEA ON EXERTION 07/11/2006  . RLQ PAIN 07/11/2006    Past Surgical History:  Procedure Laterality Date  . INTRAOPERATIVE TRANSESOPHAGEAL ECHOCARDIOGRAM N/A 01/21/2013   Procedure: INTRAOPERATIVE TRANSESOPHAGEAL ECHOCARDIOGRAM;  Surgeon: Rexene Alberts, MD;  Location: [facility];  Service: Open Heart Surgery;  Laterality: N/A;  . LEFT AND RIGHT HEART CATHETERIZATION WITH CORONARY ANGIOGRAM N/A 10/27/2011   Procedure: LEFT AND RIGHT HEART CATHETERIZATION WITH CORONARY ANGIOGRAM;  Surgeon: Hillary Bow, MD;  Location: [facility] CATH LAB;  Service: Cardiovascular;  Laterality: N/A;  . NO PAST SURGERIES    . THORACOTOMY Right 01/21/2013   Procedure: MINI/LIMITED THORACOTOMY;  Surgeon:  Rexene Alberts, MD;  Location: [facility];  Service: Open Heart Surgery;  Laterality: Right;  . TRANSCATHETER AORTIC VALVE REPLACEMENT, TRANSAORTIC N/A 01/21/2013   Procedure: TRANSCATHETER AORTIC VALVE REPLACEMENT, TRANSAORTIC;  Surgeon: Rexene Alberts, MD;  Location: [place];  Service: Open Heart Surgery;   Laterality: N/A;       Home Medications    Prior to Admission medications   Medication Sig Start Date End Date Taking? Authorizing Provider  ALPRAZolam Duanne Moron) 0.5 MG tablet Take 1 tablet (0.5 mg total) by mouth 2 (two) times daily as needed. for anxiety 12/08/16   Golden Circle, FNP  amLODipine (NORVASC) 5 MG tablet Take 1.5 tablets (7.5 mg total) by mouth daily. 07/31/16   Golden Circle, FNP  aspirin EC 325 MG tablet Take 1 tablet (325 mg total) by mouth every other day. 08/17/14   Lelon Perla, MD  Bioflavonoid Products (ESTER C PO) Take 1,000 mg by mouth daily.    [provider]  Cholecalciferol (VITAMIN D-3) 5000 UNITS TABS Take 1 capsule by mouth.    [provider]  furosemide (LASIX) 20 MG tablet Take 2 tablets (40 mg total) by mouth daily. 12/05/16   Lelon Perla, MD  metoprolol succinate (TOPROL-XL) 25 MG 24 hr tablet TAKE 1/2 TABLET BY MOUTH DAILY FOR BLOOD PRESSURE 10/06/16   Golden Circle, FNP  Multiple Vitamin (MULTIVITAMIN WITH MINERALS) TABS Take 1 tablet by mouth daily. 10/30/11   Velvet Bathe, MD  ondansetron (ZOFRAN-ODT) 4 MG disintegrating tablet Take 1 tablet (4 mg total) by mouth every 8 (eight) hours as needed for nausea or vomiting. 11/27/16   Golden Circle, FNP  oxyCODONE-acetaminophen (PERCOCET/ROXICET) 5-325 MG tablet Take 1 tablet by mouth every 4 (four) hours as needed for severe pain. 11/27/16   Golden Circle, FNP  POTASSIUM GLUCONATE ER PO Take by mouth daily. OTC    [provider]  SELENIUM PO Take 1 tablet by mouth daily.    [provider]  Specialty Vitamins Products (MAGNESIUM, AMINO ACID CHELATE,) 133 MG tablet Take 1 tablet by mouth 2 (two) times daily.    [provider]  traMADol (ULTRAM) 50 MG tablet Take 1 tablet (50 mg total) by mouth 2 (two) times daily as needed. for pain 11/16/16   Golden Circle, FNP  vitamin B-12 (CYANOCOBALAMIN) 100 MCG tablet Take 50 mcg by mouth daily.       [provider]    Family History Family History  Problem Relation Age of Onset  . Heart failure Mother 91  . Thyroid disease Mother   . Heart attack Father 6  . Stroke Father   . Other Unknown        no known medical hx of heart disease  . Heart attack Brother 55  . Diabetes Brother   . Heart attack Brother 70  . Diabetes Sister   . Hypertension Sister     Social History Social History  Substance Use Topics  . Smoking status: Former Smoker    Years: 2.50    Types: Cigarettes    Quit date: 07/02/1952  . Smokeless tobacco: Never Used     Comment: wine x2 q nite in last week 01/17/13  . Alcohol use 1.8 - 3.6 oz/week    3 - 6 Cans of beer per week     Comment: quite a history of alcohol abuse for many years, states currently drinks beer weekly but not much  Allergies   Patient has no known allergies.   Review of Systems Review of Systems  All systems reviewed and negative, other than as noted in HPI.  Physical Exam Updated Vital Signs BP (!) 182/81 (BP Location: Right Arm)   Pulse 68   Temp 98.1 F (36.7 C) (Oral)   Resp 14   SpO2 100%   Physical Exam  Constitutional: He appears well-developed and well-nourished. No distress.  HENT:  Head: Normocephalic and atraumatic.  Eyes: Conjunctivae are normal. Right eye exhibits no discharge. Left eye exhibits no discharge.  Neck: Neck supple.  Cardiovascular: Normal rate, regular rhythm and normal heart sounds.  Exam reveals no gallop and no friction rub.   No murmur heard. Pulmonary/Chest: Effort normal and breath sounds normal. No respiratory distress.  Abdominal: Soft. He exhibits no distension. There is tenderness.  Suprapubic to RLQ tenderness   Musculoskeletal: He exhibits no edema or tenderness.  Neurological: He is alert.  Skin: Skin is warm and dry.  Psychiatric: He has a normal mood and affect. His behavior is normal. Thought content normal.  Nursing note and vitals reviewed.    ED  Treatments / Results  Labs (all labs ordered are listed, but only abnormal results are displayed) Labs Reviewed  COMPREHENSIVE METABOLIC PANEL - Abnormal; Notable for the following:       Result Value   Glucose, Bld 108 (*)    Creatinine, Ser 1.27 (*)    GFR calc non Af Amer 49 (*)    GFR calc Af Amer 57 (*)    All other components within normal limits  CBC - Abnormal; Notable for the following:    Platelets 148 (*)    All other components within normal limits  URINALYSIS, ROUTINE W REFLEX MICROSCOPIC - Abnormal; Notable for the following:    Specific Gravity, Urine 1.004 (*)    All other components within normal limits  LIPASE, BLOOD    EKG  EKG Interpretation None       Radiology No results found.   Ct Abdomen Pelvis W Contrast  Result Date: 11/21/2016 CLINICAL DATA:  Abdominal pain EXAM: CT ABDOMEN AND PELVIS WITH CONTRAST TECHNIQUE: Multidetector CT imaging of the abdomen and pelvis was performed using the standard protocol following bolus administration of intravenous contrast. CONTRAST:  131m ISOVUE-300 IOPAMIDOL (ISOVUE-300) INJECTION 61% COMPARISON:  CT abdomen pelvis 09/20/2015 FINDINGS: Lower chest: Bibasilar atelectasis. Hepatobiliary: Hepatic steatosis. No focal liver lesion. No biliary dilatation. Normal gallbladder. Pancreas: Normal contours without ductal dilatation. No peripancreatic fluid collection. Spleen: Normal. Adrenals/Urinary Tract: --Adrenal glands: Normal. --Right kidney/ureter: Upper pole cyst measures 4 cm. Otherwise normal. --Left kidney/ureter: No hydronephrosis or perinephric stranding. No nephrolithiasis. No obstructing ureteral stones. --Urinary bladder: Unremarkable. Stomach/Bowel: --Stomach/Duodenum: No hiatal hernia or other gastric abnormality. Normal duodenal course and caliber. --Small bowel: No dilatation or inflammation. --Colon: No focal abnormality. --Appendix: Normal. Vascular/Lymphatic: There is a large infrarenal abdominal aortic  aneurysm, measuring 5.2 x 4.6 cm, previously 5.1 x 4.0 cm on 09/20/2015. There is no evidence of rupture. There is a large amount of plaque within the aneurysm. There is extensive aortic atherosclerosis. No abdominal or pelvic lymphadenopathy. Reproductive: Prostate is enlarged. Musculoskeletal. There is marked thoracolumbar levoscoliosis which distorts the retroperitoneal anatomy. Other: None. IMPRESSION: 1. Increased size of infrarenal abdominal aortic aneurysm, now measuring 5.2 x 4.6 cm, previously 5.1 x 4.0 cm. Recommend followup by abdomen and pelvis CTA in 3-6 months, and vascular surgery referral/consultation if not already obtained. This recommendation follows ACR consensus guidelines:  White Paper of the ACR Incidental Findings Committee II on Vascular Findings. J Am Coll Radiol 2013; 10:789-794. Aortic Atherosclerosis (ICD10-I70.0). 2. Hepatic steatosis without acute abdominal or pelvic abnormality. Electronically Signed   By: Ulyses Jarred M.D.   On: 11/21/2016 23:44    Procedures Procedures (including critical care time)  Medications Ordered in ED Medications  iopamidol (ISOVUE-300) 61 % injection (100 mLs  Contrast Given 11/21/16 2303)  ondansetron (ZOFRAN) injection 4 mg (4 mg Intravenous Given 11/21/16 2319)  HYDROmorphone (DILAUDID) injection 0.75 mg (0.75 mg Intravenous Given 11/21/16 2319)     Initial Impression / Assessment and Plan / ED Course  I have reviewed the triage vital signs and the nursing notes.  Pertinent labs & imaging results that were available during my care of the patient were reviewed by me and considered in my medical decision making (see chart for details).     89yM with abdominal pain. AAA noted. Mild increase in size. Not ruptured. Not sure actually etiology of his symptoms. W/u otherwise reassuring. FU for surveillance. Immediate return rpecautions discussed.   Final Clinical Impressions(s) / ED Diagnoses   Final diagnoses:  Abdominal pain, unspecified  abdominal location    New Prescriptions Discharge Medication List as of 11/21/2016 11:58 PM    START taking these medications   Details  oxyCODONE-acetaminophen (PERCOCET/ROXICET) 5-325 MG tablet Take 1 tablet by mouth every 4 (four) hours as needed for severe pain., Starting Tue 11/21/2016, Print         Virgel Manifold, MD 12/11/16 1116

## 2016-12-15 ENCOUNTER — Encounter (HOSPITAL_COMMUNITY): Payer: Self-pay

## 2016-12-15 ENCOUNTER — Emergency Department (HOSPITAL_COMMUNITY)
Admission: EM | Admit: 2016-12-15 | Discharge: 2016-12-15 | Disposition: A | Discharge status: Home / Self Care | Payer: Medicare Other | Attending: Emergency Medicine | Admitting: Emergency Medicine

## 2016-12-15 ENCOUNTER — Emergency Department (HOSPITAL_COMMUNITY): Payer: Medicare Other

## 2016-12-15 DIAGNOSIS — M419 Scoliosis, unspecified: Secondary | ICD-10-CM | POA: Insufficient documentation

## 2016-12-15 DIAGNOSIS — Z7982 Long term (current) use of aspirin: Secondary | ICD-10-CM | POA: Diagnosis not present

## 2016-12-15 DIAGNOSIS — F1010 Alcohol abuse, uncomplicated: Secondary | ICD-10-CM | POA: Insufficient documentation

## 2016-12-15 DIAGNOSIS — J449 Chronic obstructive pulmonary disease, unspecified: Secondary | ICD-10-CM | POA: Diagnosis not present

## 2016-12-15 DIAGNOSIS — R110 Nausea: Secondary | ICD-10-CM | POA: Diagnosis present

## 2016-12-15 DIAGNOSIS — F329 Major depressive disorder, single episode, unspecified: Secondary | ICD-10-CM | POA: Diagnosis not present

## 2016-12-15 DIAGNOSIS — Z79899 Other long term (current) drug therapy: Secondary | ICD-10-CM | POA: Insufficient documentation

## 2016-12-15 DIAGNOSIS — I110 Hypertensive heart disease with heart failure: Secondary | ICD-10-CM | POA: Insufficient documentation

## 2016-12-15 DIAGNOSIS — Z87891 Personal history of nicotine dependence: Secondary | ICD-10-CM | POA: Insufficient documentation

## 2016-12-15 DIAGNOSIS — I509 Heart failure, unspecified: Secondary | ICD-10-CM | POA: Insufficient documentation

## 2016-12-15 LAB — URINALYSIS, ROUTINE W REFLEX MICROSCOPIC
Bilirubin Urine: NEGATIVE
Glucose, UA: NEGATIVE mg/dL
Hgb urine dipstick: NEGATIVE
Ketones, ur: NEGATIVE mg/dL
LEUKOCYTES UA: NEGATIVE
NITRITE: NEGATIVE
Protein, ur: NEGATIVE mg/dL
SPECIFIC GRAVITY, URINE: 1.004 — AB (ref 1.005–1.030)
pH: 7 (ref 5.0–8.0)

## 2016-12-15 LAB — CBC WITH DIFFERENTIAL/PLATELET
Basophils Absolute: 0 10*3/uL (ref 0.0–0.1)
Basophils Relative: 1 %
EOS ABS: 0.2 10*3/uL (ref 0.0–0.7)
Eosinophils Relative: 3 %
HEMATOCRIT: 42.4 % (ref 39.0–52.0)
Hemoglobin: 14.5 g/dL (ref 13.0–17.0)
LYMPHS ABS: 1.1 10*3/uL (ref 0.7–4.0)
Lymphocytes Relative: 21 %
MCH: 31.3 pg (ref 26.0–34.0)
MCHC: 34.2 g/dL (ref 30.0–36.0)
MCV: 91.4 fL (ref 78.0–100.0)
MONO ABS: 0.7 10*3/uL (ref 0.1–1.0)
Monocytes Relative: 13 %
Neutro Abs: 3.4 10*3/uL (ref 1.7–7.7)
Neutrophils Relative %: 62 %
Platelets: 110 10*3/uL — ABNORMAL LOW (ref 150–400)
RBC: 4.64 MIL/uL (ref 4.22–5.81)
RDW: 13.2 % (ref 11.5–15.5)
WBC: 5.4 10*3/uL (ref 4.0–10.5)

## 2016-12-15 LAB — COMPREHENSIVE METABOLIC PANEL
ALBUMIN: 3.2 g/dL — AB (ref 3.5–5.0)
ALT: 17 U/L (ref 17–63)
AST: 34 U/L (ref 15–41)
Alkaline Phosphatase: 82 U/L (ref 38–126)
Anion gap: 9 (ref 5–15)
BILIRUBIN TOTAL: 0.7 mg/dL (ref 0.3–1.2)
BUN: 13 mg/dL (ref 6–20)
CO2: 29 mmol/L (ref 22–32)
CREATININE: 0.98 mg/dL (ref 0.61–1.24)
Calcium: 8.6 mg/dL — ABNORMAL LOW (ref 8.9–10.3)
Chloride: 98 mmol/L — ABNORMAL LOW (ref 101–111)
GFR calc Af Amer: >60 mL/min (ref >60)
GLUCOSE: 85 mg/dL (ref 65–99)
POTASSIUM: 3.8 mmol/L (ref 3.5–5.1)
Sodium: 136 mmol/L (ref 135–145)
TOTAL PROTEIN: 7.1 g/dL (ref 6.5–8.1)

## 2016-12-15 LAB — ETHANOL: Alcohol, Ethyl (B): 20 mg/dL — ABNORMAL HIGH (ref <5)

## 2016-12-15 LAB — LIPASE, BLOOD: Lipase: 28 U/L (ref 11–51)

## 2016-12-15 MED ORDER — RANITIDINE HCL 150 MG PO CAPS: 150.0000 mg | ORAL_CAPSULE | Freq: Every day | ORAL | 0 refills | Status: DC

## 2016-12-15 MED ORDER — ONDANSETRON HCL 4 MG/2ML IJ SOLN
4.0000 mg | Freq: Once | INTRAMUSCULAR | Status: AC
  Administered 2016-12-15: 4 mg via INTRAVENOUS
  Filled 2016-12-15: qty 2

## 2016-12-15 MED ORDER — VITAMIN B-1 100 MG PO TABS: 100.0000 mg | ORAL_TABLET | Freq: Every day | ORAL | 0 refills | Status: AC

## 2016-12-15 MED ORDER — VITAMIN B-1 100 MG PO TABS
100.0000 mg | ORAL_TABLET | Freq: Once | ORAL | Status: AC
  Administered 2016-12-15: 100 mg via ORAL
  Filled 2016-12-15: qty 1

## 2016-12-15 MED ORDER — SODIUM CHLORIDE 0.9 % IV BOLUS (SEPSIS)
1000.0000 mL | Freq: Once | INTRAVENOUS | Status: AC
  Administered 2016-12-15: 1000 mL via INTRAVENOUS

## 2016-12-15 MED ORDER — ONDANSETRON 4 MG PO TBDP
8.0000 mg | ORAL_TABLET | Freq: Once | ORAL | Status: DC
  Filled 2016-12-15: qty 2

## 2016-12-15 NOTE — Discharge Planning (Signed)
Aaren Atallah J. Lucretia Roers, RN, BSN, [employer] [phone] Spoke with pt at bedside regarding discharge planning for [facility]. Offered pt list of home health agencies to choose from.  Pt chose [facility] to render services. [facility] of [facility] notified. Patient made aware that [facility] will be in contact in 24-48 hours.  No DME needs identified at this time.

## 2016-12-15 NOTE — Clinical Social Work Note (Signed)
Clinical Social Work Assessment  Patient Details  Name: Samuel [name] MRN: [id number] Date of Birth: [birth year]  Date of referral:  12/15/16               Reason for consult:  Housing Concerns/Homelessness (pt lives alone and appears to need more support at home.)                Permission sought to share information with:  Case Manager Permission granted to share information::     Name::     Reeves Forth  Agency::     Relationship::     Contact Information:     Housing/Transportation Living arrangements for the past 2 months:  Single Family Home (lives alone. ) Source of Information:  Patient Patient Interpreter Needed:  None Criminal Activity/Legal Involvement Pertinent to Current Situation/Hospitalization:  No - Comment as needed Significant Relationships:  Adult Children Samuel [name] (Daughter).) Lives with:  Self Do you feel safe going back to the place where you live?  Yes (but will need HH assistance if going home. ) Need for family participation in patient care:  Yes (Comment)  Care giving concerns:  CSW spoke with pt at bedside. During this time pt doe snot report any concerns to CSW.    Social Worker assessment / plan:  CSW spoke with pt at bedside. At this tim ept reports that pt is from home alone and has lived alone most of pt's life. Pt reports having a daughter Samuel [name]) that comes and checks on pt once a week, aside from daughter-pt has no other supports. Pt wants to return back home but does need more assistance such as HH at this time. CSW will make RN CM aware of pt's need at this time.   Employment status:  Retired Health and safety inspector:  Medicare PT Recommendations:  Not assessed at this time Information / Referral to community resources:     Patient/Family's Response to care:  Pt is understanding and agreeable to plan of care at this time.   Patient/Family's Understanding of and Emotional Response to Diagnosis, Current Treatment, and Prognosis:  No further  questions or concerns have been presented to CSW at this time.   Emotional Assessment Appearance:  Appears stated age Attitude/Demeanor/Rapport:    Affect (typically observed):  Pleasant Orientation:  Oriented to Self, Oriented to Place, Oriented to  Time, Oriented to Situation Alcohol / Substance use:  Alcohol Use Psych involvement (Current and /or in the community):  No (Comment)  Discharge Needs  Concerns to be addressed:  Care Coordination Readmission within the last 30 days:  No Current discharge risk:  None Barriers to Discharge:  No Barriers Identified   Robb Matar, LCSWA 12/15/2016, 7:51 AM

## 2016-12-15 NOTE — Progress Notes (Signed)
CSW spoke with pt's doctor and confirmed that pt does not have medical need to be admitted for at this time. Doctor informed CSW that pt wants to return home, but may need  HH assistance if going back home. CSW informed doctor that CSW would make RN CM aware of this. No further CSW interventions needed at this time.  Claude Manges Hadassah Rana, MSW, LCSW-A Emergency Department Clinical Social Worker [phone]

## 2016-12-15 NOTE — ED Notes (Addendum)
Upon standing up pt reports feeling dizzy and states he is unable to ambulate. Pt provided a urine sample but states he had to spit in it due to coughing EDP made aware

## 2016-12-15 NOTE — ED Provider Notes (Signed)
I assumed care of this patient from Dr. Bebe Shaggy at 0800.  Please see their note for further details of Hx, PE.  Briefly patient is a 83 y.o. male who presents with nausea and EtOH use that is having difficulty ambulating. Medically cleared. No evidence to suggest Wernicke's. Current plan is to await SW and care management evaluation for home resources.  Patient set up with home health, physical therapy and social worker.  The patient is safe for discharge with strict return precautions.  Disposition: Discharge  Condition: Good  I have discussed the results, Dx and Tx plan with the patient who expressed understanding and agree(s) with the plan. Discharge instructions discussed at great length. The patient was given strict return precautions who verbalized understanding of the instructions. No further questions at time of discharge.    New Prescriptions   RANITIDINE (ZANTAC) 150 MG CAPSULE    Take 1 capsule (150 mg total) by mouth daily.   THIAMINE (VITAMIN B-1) 100 MG TABLET    Take 1 tablet (100 mg total) by mouth daily.    Follow Up: Veryl Speak, FNP [address] [employer] [place] [address] [phone]  Schedule an appointment as soon as possible for a visit          [place], Amadeo Garnet, MD 12/15/16 1043

## 2016-12-15 NOTE — ED Notes (Signed)
Pt reports he has been having problems with nausea for 3 weeks and have seen his doctor.  He was given nausea med without relief.  Pt is Alert and oriented.  Denies any pain at this time

## 2016-12-15 NOTE — ED Triage Notes (Signed)
Patient here for evaluation of recurrent nausea, drank ETOH to help with nausea and it made it worse.  Patient is A&Ox4 at this time.  Per EMS house is dangerous to go back to, patient has problems with ambulation and EMS states entire house is a fall risk for him.

## 2016-12-15 NOTE — ED Notes (Signed)
Patient given urinal.

## 2016-12-15 NOTE — ED Provider Notes (Signed)
[facility] DEPT Provider Note   CSN: [id number] Arrival date & time: 12/15/16  0010     History   Chief Complaint Chief Complaint  Patient presents with  . Nausea    HPI Samuel [name] is a 83 y.o. male.  The history is provided by the patient.  Illness  This is a new problem. The current episode started 12 to 24 hours ago. The problem occurs constantly. The problem has been gradually worsening. Pertinent negatives include no chest pain and no abdominal pain. Nothing aggravates the symptoms. Nothing relieves the symptoms. Treatments tried: alcohol. The treatment provided no relief.  Patient reports he has had nausea for the past day He reports he has been drinking ETOH to help his nausea No fever/vomiting No abd pain reported   Past Medical History:  Diagnosis Date  . Abdominal aortic aneurysm ([place]) 10/29/2011   3.7cm AAA by CT scan with unusual appearance suggestive of penetrating atherosclerotic ulcer or localized dissection  . Alcohol abuse   . Aortic stenosis    Echo 7/14:  Severe LVH, EF 55-60%, severe AS, mean gradient 78 mmHg, MAC, mild LAE  . Arthritis    "in the low vertebra"  . CHF (congestive heart failure) ([place])   . Chronic lower back pain   . Complication of anesthesia    NO NECK LINES ON RIGHT FOR SURGERY  . COPD (chronic obstructive pulmonary disease) ([place])   . Decreased libido   . Depression   . DIZZINESS, CHRONIC   . Essential hypertension, benign   . GERD (gastroesophageal reflux disease)    occ  . Heart murmur   . Perivalvular leak of prosthetic heart valve    echo (02/21/13): Mild LVH, EF 55-60%, aortic valve with mild to moderate perivalvular leak, AI, mean aortic valve gradient 6 mm of mercury, MAC.  . 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 Scoliosis/kyphoscoliosis 10/28/2011  . Shortness  of breath 10/24/11   "all the time"    Patient Active Problem List   Diagnosis Date Noted  . Decreased mobility 11/27/2016  . Medicare annual wellness visit, subsequent 07/31/2016  . Acute upper respiratory infection 03/09/2016  . Nausea without vomiting 04/06/2015  . Protein-calorie malnutrition, severe ([place]) 02/24/2015  . Dehydration 02/23/2015  . H/O aortic valve replacement 04/14/2013  . Osteoarthritis of right hip 01/29/2013  . 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 Aortic stenosis, severe 11/20/2011  . Abdominal aortic aneurysm ([place]) 10/29/2011  . Scoliosis/kyphoscoliosis 10/28/2011  . Alcohol intoxication ([place]) 10/24/2011  . Physical deconditioning 10/24/2011  . Generalized weakness 10/24/2011  . Altered mental status 10/24/2011  . Fever and chills 10/24/2011  . Dysphagia 10/24/2011  . Essential hypertension, benign 03/04/2010  . AORTIC STENOSIS 09/03/2008  . COPD bronchitis 09/03/2008  . DEPRESSION 07/19/2007  . CELLULITIS AND ABSCESS OF FOOT EXCEPT TOES 07/19/2007  . DIZZINESS, CHRONIC 07/11/2007  . DECREASED LIBIDO 07/11/2007  . Alcohol abuse 07/08/2007  . HEADACHE 07/08/2007  . HEART MURMUR 07/11/2006  . DYSPNEA ON EXERTION 07/11/2006  . RLQ PAIN 07/11/2006    Past Surgical History:  Procedure Laterality Date  . INTRAOPERATIVE TRANSESOPHAGEAL ECHOCARDIOGRAM N/A 01/21/2013   Procedure: INTRAOPERATIVE TRANSESOPHAGEAL ECHOCARDIOGRAM;  Surgeon: Rexene Alberts, MD;  Location: [facility];  Service: Open Heart Surgery;  Laterality: N/A;  . LEFT AND RIGHT HEART CATHETERIZATION WITH CORONARY ANGIOGRAM N/A 10/27/2011  Procedure: LEFT AND RIGHT HEART CATHETERIZATION WITH CORONARY ANGIOGRAM;  Surgeon: Hillary Bow, MD;  Location: [facility] CATH LAB;  Service: Cardiovascular;  Laterality: N/A;  . NO PAST SURGERIES    . THORACOTOMY Right 01/21/2013   Procedure: MINI/LIMITED THORACOTOMY;  Surgeon: Rexene Alberts, MD;  Location: [facility];  Service: Open Heart  Surgery;  Laterality: Right;  . TRANSCATHETER AORTIC VALVE REPLACEMENT, TRANSAORTIC N/A 01/21/2013   Procedure: TRANSCATHETER AORTIC VALVE REPLACEMENT, TRANSAORTIC;  Surgeon: Rexene Alberts, MD;  Location: [facility];  Service: Open Heart Surgery;  Laterality: N/A;       Home Medications    Prior to Admission medications   Medication Sig Start Date End Date Taking? Authorizing Provider  ALPRAZolam Duanne Moron) 0.5 MG tablet Take 1 tablet (0.5 mg total) by mouth 2 (two) times daily as needed. for anxiety 12/08/16   Golden Circle, FNP  amLODipine (NORVASC) 5 MG tablet Take 1.5 tablets (7.5 mg total) by mouth daily. 07/31/16   Golden Circle, FNP  aspirin EC 325 MG tablet Take 1 tablet (325 mg total) by mouth every other day. 08/17/14   Lelon Perla, MD  Bioflavonoid Products (ESTER C PO) Take 1,000 mg by mouth daily.    [provider]  Cholecalciferol (VITAMIN D-3) 5000 UNITS TABS Take 1 capsule by mouth.    [provider]  furosemide (LASIX) 20 MG tablet Take 2 tablets (40 mg total) by mouth daily. 12/05/16   Lelon Perla, MD  metoprolol succinate (TOPROL-XL) 25 MG 24 hr tablet TAKE 1/2 TABLET BY MOUTH DAILY FOR BLOOD PRESSURE 10/06/16   Golden Circle, FNP  Multiple Vitamin (MULTIVITAMIN WITH MINERALS) TABS Take 1 tablet by mouth daily. 10/30/11   Velvet Bathe, MD  ondansetron (ZOFRAN-ODT) 4 MG disintegrating tablet Take 1 tablet (4 mg total) by mouth every 8 (eight) hours as needed for nausea or vomiting. 11/27/16   Golden Circle, FNP  oxyCODONE-acetaminophen (PERCOCET/ROXICET) 5-325 MG tablet Take 1 tablet by mouth every 4 (four) hours as needed for severe pain. 11/27/16   Golden Circle, FNP  POTASSIUM GLUCONATE ER PO Take by mouth daily. OTC    [provider]  SELENIUM PO Take 1 tablet by mouth daily.    [provider]  Specialty Vitamins Products (MAGNESIUM, AMINO ACID CHELATE,) 133 MG tablet Take 1 tablet by mouth 2 (two) times daily.     [provider]  traMADol (ULTRAM) 50 MG tablet Take 1 tablet (50 mg total) by mouth 2 (two) times daily as needed. for pain 11/16/16   Golden Circle, FNP  vitamin B-12 (CYANOCOBALAMIN) 100 MCG tablet Take 50 mcg by mouth daily.      [provider]    Family History Family History  Problem Relation Age of Onset  . Heart failure Mother 91  . Thyroid disease Mother   . Heart attack Father 46  . Stroke Father   . Other Unknown        no known medical hx of heart disease  . Heart attack Brother 52  . Diabetes Brother   . Heart attack Brother 91  . Diabetes Sister   . Hypertension Sister     Social History Social History  Substance Use Topics  . Smoking status: Former Smoker    Years: 2.50    Types: Cigarettes    Quit date: 07/02/1952  . Smokeless tobacco: Never Used     Comment: wine x2 q nite in last week 01/17/13  .  Alcohol use 1.8 - 3.6 oz/week    3 - 6 Cans of beer per week     Comment: quite a history of alcohol abuse for many years, states currently drinks beer weekly but not much     Allergies   Patient has no known allergies.   Review of Systems Review of Systems  Constitutional: Positive for fatigue. Negative for fever.  Respiratory: Positive for cough.   Cardiovascular: Negative for chest pain.  Gastrointestinal: Positive for nausea. Negative for abdominal pain and vomiting.  All other systems reviewed and are negative.    Physical Exam Updated Vital Signs BP 104/90   Pulse 86   Temp 98.2 F (36.8 C) (Oral)   Resp 18   SpO2 99%   Physical Exam CONSTITUTIONAL: Elderly, frail, disheveled, smells of urine HEAD: Normocephalic/atraumatic EYES: EOMI/PERRL ENMT: Mucous membranes dry NECK: supple no meningeal signs SPINE/BACK:entire spine nontender CV: S1/S2 noted, no murmurs/rubs/gallops noted LUNGS: coarse BS noted bilaterally, coughs frequently during exam ABDOMEN: soft, nontender  GU:no cva tenderness NEURO: Pt is  awake/alert/appropriate, moves all extremitiesx4.  No facial droop.   EXTREMITIES: pulses normal/equal, full ROM SKIN: warm, color normal PSYCH: flat affect   ED Treatments / Results  Labs (all labs ordered are listed, but only abnormal results are displayed) Labs Reviewed  COMPREHENSIVE METABOLIC PANEL - Abnormal; Notable for the following:       Result Value   Chloride 98 (*)    Calcium 8.6 (*)    Albumin 3.2 (*)    All other components within normal limits  CBC WITH DIFFERENTIAL/PLATELET - Abnormal; Notable for the following:    Platelets 110 (*)    All other components within normal limits  ETHANOL - Abnormal; Notable for the following:    Alcohol, Ethyl (B) 20 (*)    All other components within normal limits  URINALYSIS, ROUTINE W REFLEX MICROSCOPIC - Abnormal; Notable for the following:    Color, Urine STRAW (*)    Specific Gravity, Urine 1.004 (*)    All other components within normal limits  LIPASE, BLOOD    EKG  EKG Interpretation  Date/Time:  Friday December 15 2016 02:01:56 EDT Ventricular Rate:  60 PR Interval:  166 QRS Duration: 94 QT Interval:  472 QTC Calculation: 472 R Axis:   55 Text Interpretation:  Normal sinus rhythm Normal ECG No significant change since last tracing Confirmed by Ripley Fraise [phone]) on 12/15/2016 2:14:08 AM       Radiology No results found.  Procedures Procedures (including critical care time)  Medications Ordered in ED Medications  ondansetron (ZOFRAN) injection 4 mg (4 mg Intravenous Given 12/15/16 0127)  sodium chloride 0.9 % bolus 1,000 mL (0 mLs Intravenous Stopped 12/15/16 0429)     Initial Impression / Assessment and Plan / ED Course  I have reviewed the triage vital signs and the nursing notes.  Pertinent labs & imaging results that were available during my care of the patient were reviewed by me and considered in my medical decision making (see chart for details).     12:33 AM Pt in the ED for nausea  that he tried to improve with ETOH He is disheveled, smells of urine Per EMS, house is "dangerous" Pt has also had issues with ambulating recently Labs/imaging ordered 7:43 AM Pt slept most of night Labs reassuring At this point no acute medical emergency at this time, as apparently pain limits his evaluation He is not altered No new pain complaints He had difficulty  getting up, and told nurse he has chronic pain which limits evaluation It appears he is unsafe to be discharged I advised patient to be seen by case management and social work  7:48 AM Seen by social work He refuses placement Signed out to dr Leonette Monarch with case management evaluation pending    Final Clinical Impressions(s) / ED Diagnoses   Final diagnoses:  Nausea  Alcohol abuse    New Prescriptions New Prescriptions   No medications on file     Ripley Fraise, MD 12/15/16 [phone]

## 2016-12-19 ENCOUNTER — Telehealth: Payer: Self-pay | Admitting: Family

## 2016-12-19 NOTE — Telephone Encounter (Signed)
Chart reviewed agree with home health plan

## 2016-12-19 NOTE — Telephone Encounter (Signed)
Notified Nicolette w/Greg response.,../lm,b

## 2016-12-19 NOTE — Telephone Encounter (Signed)
[name]  [phone]  Need verbals for skilled nursing  1 week 4  Home health aid  1 week 2   PT  2 week 3  Need medical social worker

## 2016-12-29 DIAGNOSIS — F329 Major depressive disorder, single episode, unspecified: Secondary | ICD-10-CM | POA: Diagnosis not present

## 2016-12-29 DIAGNOSIS — Z87891 Personal history of nicotine dependence: Secondary | ICD-10-CM | POA: Diagnosis not present

## 2016-12-29 DIAGNOSIS — I714 Abdominal aortic aneurysm, without rupture: Secondary | ICD-10-CM | POA: Diagnosis not present

## 2016-12-29 DIAGNOSIS — M1990 Unspecified osteoarthritis, unspecified site: Secondary | ICD-10-CM | POA: Diagnosis not present

## 2016-12-29 DIAGNOSIS — I509 Heart failure, unspecified: Secondary | ICD-10-CM | POA: Diagnosis not present

## 2016-12-29 DIAGNOSIS — I110 Hypertensive heart disease with heart failure: Secondary | ICD-10-CM | POA: Diagnosis not present

## 2016-12-29 DIAGNOSIS — Z7982 Long term (current) use of aspirin: Secondary | ICD-10-CM | POA: Diagnosis not present

## 2016-12-29 DIAGNOSIS — F1010 Alcohol abuse, uncomplicated: Secondary | ICD-10-CM | POA: Diagnosis not present

## 2016-12-29 DIAGNOSIS — K219 Gastro-esophageal reflux disease without esophagitis: Secondary | ICD-10-CM | POA: Diagnosis not present

## 2016-12-29 DIAGNOSIS — M545 Low back pain: Secondary | ICD-10-CM | POA: Diagnosis not present

## 2017-01-10 ENCOUNTER — Other Ambulatory Visit: Payer: Self-pay | Admitting: Family

## 2017-01-10 NOTE — Telephone Encounter (Signed)
Last refill was 12/13/16 per [facility] DB

## 2017-01-11 NOTE — Telephone Encounter (Signed)
Rx faxed

## 2017-01-17 ENCOUNTER — Telehealth: Payer: Self-pay | Admitting: Family

## 2017-01-17 ENCOUNTER — Other Ambulatory Visit: Payer: Self-pay | Admitting: Family

## 2017-01-24 MED ORDER — METOPROLOL SUCCINATE ER 25 MG PO TB24: ORAL_TABLET | ORAL | 0 refills | Status: DC

## 2017-01-24 NOTE — Addendum Note (Signed)
Addended by: Mercer PodWRENN, Bon [place]is E on: 01/24/2017 01:42 PM   Modules accepted: Orders

## 2017-01-24 NOTE — Telephone Encounter (Signed)
Pt called and lost his metoprolol succinate (TOPROL-XL) 25 MG 24 hr tablet He [facility], please advise, he will not have the money for a refill until the first and he needs to get by until then, the pharmacy has agreed to give him the medicine  [facility]

## 2017-01-24 NOTE — Telephone Encounter (Signed)
Rx sent 

## 2017-01-31 ENCOUNTER — Other Ambulatory Visit: Payer: Self-pay | Admitting: Nurse Practitioner

## 2017-02-14 ENCOUNTER — Telehealth: Payer: Self-pay

## 2017-02-14 ENCOUNTER — Other Ambulatory Visit: Payer: Self-pay | Admitting: Nurse Practitioner

## 2017-02-14 MED ORDER — TRAMADOL HCL 50 MG PO TABS: 50.0000 mg | ORAL_TABLET | Freq: Two times a day (BID) | ORAL | 0 refills | Status: AC | PRN

## 2017-02-14 MED ORDER — ALPRAZOLAM 0.5 MG PO TABS: 0.5000 mg | ORAL_TABLET | Freq: Two times a day (BID) | ORAL | 0 refills | Status: AC | PRN

## 2017-02-14 NOTE — Telephone Encounter (Signed)
[facility] requesting refill of tramadol and xanax. Does have an appointment with you on 03/16/2017.   Last refill of xanax was 01/10/17 Last refill of tramadol 01/11/17

## 2017-02-14 NOTE — Telephone Encounter (Signed)
Spoke with pt and he stated that he needed both medications. Tramadol for severe hip pain and xanax for breathing. Per Apolonio SchneidersAshleigh I have called in only enough to get him to his appointment on 03/10/2017. Pt is aware.

## 2017-02-19 ENCOUNTER — Telehealth: Payer: Self-pay | Admitting: Family

## 2017-02-19 DIAGNOSIS — R531 Weakness: Secondary | ICD-10-CM

## 2017-02-19 NOTE — Telephone Encounter (Signed)
Would you be willing to put in a referral in for home health for nursing or [facility] to [facility] pt. He is establishing care with you on 03/28/2017. He was last seen on 11/27/16 by Tammy SoursGreg. Please advise.

## 2017-02-19 NOTE — Telephone Encounter (Signed)
Pts referral has been put in for home health.

## 2017-02-19 NOTE — Telephone Encounter (Signed)
Samuel [name] (social worker from [facility] called stating that the pt is having incontinence issues. They would like to have a CNA come out to the home to help the pt. He is not able to keep himself or his clothing clean and needs an assessment for CNA help. Please advise.

## 2017-03-13 ENCOUNTER — Other Ambulatory Visit: Payer: Self-pay

## 2017-03-13 ENCOUNTER — Inpatient Hospital Stay (HOSPITAL_COMMUNITY)
Admission: EM | Admit: 2017-03-13 | Discharge: 2017-04-03 | DRG: 264 | Disposition: E | Payer: Medicare Other | Attending: Pulmonary Disease | Admitting: Pulmonary Disease

## 2017-03-13 ENCOUNTER — Emergency Department (HOSPITAL_COMMUNITY): Payer: Medicare Other

## 2017-03-13 ENCOUNTER — Encounter (HOSPITAL_COMMUNITY): Payer: Self-pay | Admitting: Emergency Medicine

## 2017-03-13 ENCOUNTER — Observation Stay (HOSPITAL_COMMUNITY): Payer: Medicare Other

## 2017-03-13 ENCOUNTER — Observation Stay (HOSPITAL_BASED_OUTPATIENT_CLINIC_OR_DEPARTMENT_OTHER): Payer: Medicare Other

## 2017-03-13 DIAGNOSIS — R079 Chest pain, unspecified: Secondary | ICD-10-CM | POA: Diagnosis not present

## 2017-03-13 DIAGNOSIS — I130 Hypertensive heart and chronic kidney disease with heart failure and stage 1 through stage 4 chronic kidney disease, or unspecified chronic kidney disease: Secondary | ICD-10-CM | POA: Diagnosis present

## 2017-03-13 DIAGNOSIS — N183 Chronic kidney disease, stage 3 (moderate): Secondary | ICD-10-CM | POA: Diagnosis not present

## 2017-03-13 DIAGNOSIS — Z9889 Other specified postprocedural states: Secondary | ICD-10-CM

## 2017-03-13 DIAGNOSIS — I714 Abdominal aortic aneurysm, without rupture, unspecified: Secondary | ICD-10-CM | POA: Diagnosis present

## 2017-03-13 DIAGNOSIS — K219 Gastro-esophageal reflux disease without esophagitis: Secondary | ICD-10-CM | POA: Diagnosis not present

## 2017-03-13 DIAGNOSIS — F329 Major depressive disorder, single episode, unspecified: Secondary | ICD-10-CM | POA: Diagnosis present

## 2017-03-13 DIAGNOSIS — I5032 Chronic diastolic (congestive) heart failure: Secondary | ICD-10-CM | POA: Diagnosis present

## 2017-03-13 DIAGNOSIS — Z7982 Long term (current) use of aspirin: Secondary | ICD-10-CM

## 2017-03-13 DIAGNOSIS — K55049 Acute infarction of large intestine, extent unspecified: Secondary | ICD-10-CM | POA: Diagnosis not present

## 2017-03-13 DIAGNOSIS — E871 Hypo-osmolality and hyponatremia: Secondary | ICD-10-CM | POA: Diagnosis not present

## 2017-03-13 DIAGNOSIS — I10 Essential (primary) hypertension: Secondary | ICD-10-CM

## 2017-03-13 DIAGNOSIS — M419 Scoliosis, unspecified: Secondary | ICD-10-CM | POA: Diagnosis present

## 2017-03-13 DIAGNOSIS — Z952 Presence of prosthetic heart valve: Secondary | ICD-10-CM | POA: Diagnosis not present

## 2017-03-13 DIAGNOSIS — G9341 Metabolic encephalopathy: Secondary | ICD-10-CM | POA: Diagnosis not present

## 2017-03-13 DIAGNOSIS — E860 Dehydration: Secondary | ICD-10-CM | POA: Diagnosis present

## 2017-03-13 DIAGNOSIS — F1010 Alcohol abuse, uncomplicated: Secondary | ICD-10-CM | POA: Diagnosis present

## 2017-03-13 DIAGNOSIS — J449 Chronic obstructive pulmonary disease, unspecified: Secondary | ICD-10-CM | POA: Diagnosis present

## 2017-03-13 DIAGNOSIS — Z833 Family history of diabetes mellitus: Secondary | ICD-10-CM

## 2017-03-13 DIAGNOSIS — F419 Anxiety disorder, unspecified: Secondary | ICD-10-CM | POA: Diagnosis not present

## 2017-03-13 DIAGNOSIS — N179 Acute kidney failure, unspecified: Secondary | ICD-10-CM | POA: Diagnosis present

## 2017-03-13 DIAGNOSIS — D631 Anemia in chronic kidney disease: Secondary | ICD-10-CM | POA: Diagnosis present

## 2017-03-13 DIAGNOSIS — Z8249 Family history of ischemic heart disease and other diseases of the circulatory system: Secondary | ICD-10-CM

## 2017-03-13 DIAGNOSIS — K55029 Acute infarction of small intestine, extent unspecified: Secondary | ICD-10-CM | POA: Diagnosis not present

## 2017-03-13 DIAGNOSIS — J9601 Acute respiratory failure with hypoxia: Secondary | ICD-10-CM | POA: Diagnosis not present

## 2017-03-13 DIAGNOSIS — J690 Pneumonitis due to inhalation of food and vomit: Secondary | ICD-10-CM | POA: Diagnosis not present

## 2017-03-13 DIAGNOSIS — T8110XA Postprocedural shock unspecified, initial encounter: Secondary | ICD-10-CM | POA: Diagnosis not present

## 2017-03-13 DIAGNOSIS — R5381 Other malaise: Secondary | ICD-10-CM | POA: Diagnosis present

## 2017-03-13 DIAGNOSIS — R0600 Dyspnea, unspecified: Secondary | ICD-10-CM | POA: Diagnosis present

## 2017-03-13 DIAGNOSIS — Z823 Family history of stroke: Secondary | ICD-10-CM

## 2017-03-13 DIAGNOSIS — F1020 Alcohol dependence, uncomplicated: Secondary | ICD-10-CM | POA: Diagnosis present

## 2017-03-13 DIAGNOSIS — Z8349 Family history of other endocrine, nutritional and metabolic diseases: Secondary | ICD-10-CM

## 2017-03-13 DIAGNOSIS — Z01818 Encounter for other preprocedural examination: Secondary | ICD-10-CM

## 2017-03-13 DIAGNOSIS — Z515 Encounter for palliative care: Secondary | ICD-10-CM | POA: Diagnosis not present

## 2017-03-13 DIAGNOSIS — N182 Chronic kidney disease, stage 2 (mild): Secondary | ICD-10-CM | POA: Diagnosis present

## 2017-03-13 DIAGNOSIS — R52 Pain, unspecified: Secondary | ICD-10-CM

## 2017-03-13 DIAGNOSIS — Z66 Do not resuscitate: Secondary | ICD-10-CM | POA: Diagnosis not present

## 2017-03-13 DIAGNOSIS — R0902 Hypoxemia: Secondary | ICD-10-CM

## 2017-03-13 DIAGNOSIS — K210 Gastro-esophageal reflux disease with esophagitis: Secondary | ICD-10-CM | POA: Diagnosis present

## 2017-03-13 DIAGNOSIS — Z87891 Personal history of nicotine dependence: Secondary | ICD-10-CM

## 2017-03-13 LAB — URINALYSIS, ROUTINE W REFLEX MICROSCOPIC
Bacteria, UA: NONE SEEN
Bilirubin Urine: NEGATIVE
GLUCOSE, UA: NEGATIVE mg/dL
Ketones, ur: NEGATIVE mg/dL
LEUKOCYTES UA: NEGATIVE
NITRITE: NEGATIVE
PH: 6 (ref 5.0–8.0)
Protein, ur: NEGATIVE mg/dL
SPECIFIC GRAVITY, URINE: 1.021 (ref 1.005–1.030)
Squamous Epithelial / LPF: NONE SEEN

## 2017-03-13 LAB — BASIC METABOLIC PANEL
Anion gap: 14 (ref 5–15)
BUN: 18 mg/dL (ref 6–20)
CALCIUM: 8.4 mg/dL — AB (ref 8.9–10.3)
CO2: 25 mmol/L (ref 22–32)
Chloride: 91 mmol/L — ABNORMAL LOW (ref 101–111)
Creatinine, Ser: 1.39 mg/dL — ABNORMAL HIGH (ref 0.61–1.24)
GFR, EST AFRICAN AMERICAN: 51 mL/min — AB (ref >60)
GFR, EST NON AFRICAN AMERICAN: 44 mL/min — AB (ref >60)
Glucose, Bld: 114 mg/dL — ABNORMAL HIGH (ref 65–99)
Potassium: 4.2 mmol/L (ref 3.5–5.1)
Sodium: 130 mmol/L — ABNORMAL LOW (ref 135–145)

## 2017-03-13 LAB — TROPONIN I
Troponin I: <0.03 ng/mL (ref <0.03)
Troponin I: <0.03 ng/mL (ref <0.03)
Troponin I: <0.03 ng/mL (ref <0.03)

## 2017-03-13 LAB — HEPATIC FUNCTION PANEL
ALK PHOS: 91 U/L (ref 38–126)
ALT: 13 U/L — AB (ref 17–63)
AST: 43 U/L — ABNORMAL HIGH (ref 15–41)
Albumin: 3.4 g/dL — ABNORMAL LOW (ref 3.5–5.0)
BILIRUBIN DIRECT: 0.3 mg/dL (ref 0.1–0.5)
BILIRUBIN INDIRECT: 0.6 mg/dL (ref 0.3–0.9)
Total Bilirubin: 0.9 mg/dL (ref 0.3–1.2)
Total Protein: 7.4 g/dL (ref 6.5–8.1)

## 2017-03-13 LAB — CBC
HCT: 40.5 % (ref 39.0–52.0)
HEMOGLOBIN: 14.2 g/dL (ref 13.0–17.0)
MCH: 31.1 pg (ref 26.0–34.0)
MCHC: 35.1 g/dL (ref 30.0–36.0)
MCV: 88.6 fL (ref 78.0–100.0)
Platelets: 170 10*3/uL (ref 150–400)
RBC: 4.57 MIL/uL (ref 4.22–5.81)
RDW: 12.2 % (ref 11.5–15.5)
WBC: 9.2 10*3/uL (ref 4.0–10.5)

## 2017-03-13 LAB — I-STAT TROPONIN, ED: TROPONIN I, POC: 0.01 ng/mL (ref 0.00–0.08)

## 2017-03-13 LAB — ECHOCARDIOGRAM COMPLETE
Height: 68 in
WEIGHTICAEL: 2248 [oz_av]

## 2017-03-13 LAB — MAGNESIUM: MAGNESIUM: 1.9 mg/dL (ref 1.7–2.4)

## 2017-03-13 LAB — D-DIMER, QUANTITATIVE: D-Dimer, Quant: 2.54 ug/mL-FEU — ABNORMAL HIGH (ref 0.00–0.50)

## 2017-03-13 LAB — BRAIN NATRIURETIC PEPTIDE: B Natriuretic Peptide: 282.1 pg/mL — ABNORMAL HIGH (ref 0.0–100.0)

## 2017-03-13 MED ORDER — ZOLPIDEM TARTRATE 5 MG PO TABS
5.0000 mg | ORAL_TABLET | Freq: Every evening | ORAL | Status: DC | PRN
  Administered 2017-03-15: 5 mg via ORAL
  Filled 2017-03-13: qty 1

## 2017-03-13 MED ORDER — MORPHINE SULFATE (PF) 4 MG/ML IV SOLN
2.0000 mg | Freq: Once | INTRAVENOUS | Status: AC
  Administered 2017-03-13: 2 mg via INTRAVENOUS
  Filled 2017-03-13: qty 1

## 2017-03-13 MED ORDER — ONDANSETRON HCL 4 MG/2ML IJ SOLN
4.0000 mg | Freq: Once | INTRAMUSCULAR | Status: AC
  Administered 2017-03-13: 4 mg via INTRAVENOUS
  Filled 2017-03-13: qty 2

## 2017-03-13 MED ORDER — VITAMIN D 1000 UNITS PO TABS
5000.0000 [IU] | ORAL_TABLET | Freq: Every day | ORAL | Status: DC
  Administered 2017-03-13 – 2017-03-15 (×3): 5000 [IU] via ORAL
  Filled 2017-03-13 (×3): qty 5

## 2017-03-13 MED ORDER — DM-GUAIFENESIN ER 30-600 MG PO TB12
1.0000 | ORAL_TABLET | Freq: Two times a day (BID) | ORAL | Status: DC | PRN
  Filled 2017-03-13: qty 1

## 2017-03-13 MED ORDER — MAGNESIUM OXIDE 400 (241.3 MG) MG PO TABS
200.0000 mg | ORAL_TABLET | Freq: Two times a day (BID) | ORAL | Status: DC
  Administered 2017-03-13 – 2017-03-15 (×6): 200 mg via ORAL
  Filled 2017-03-13 (×5): qty 1

## 2017-03-13 MED ORDER — ALPRAZOLAM 0.5 MG PO TABS
0.5000 mg | ORAL_TABLET | Freq: Two times a day (BID) | ORAL | Status: DC | PRN
  Administered 2017-03-13 – 2017-03-15 (×6): 0.5 mg via ORAL
  Filled 2017-03-13 (×6): qty 1

## 2017-03-13 MED ORDER — ASPIRIN EC 325 MG PO TBEC: 325.0000 mg | DELAYED_RELEASE_TABLET | ORAL | Status: DC

## 2017-03-13 MED ORDER — TRAMADOL HCL 50 MG PO TABS
50.0000 mg | ORAL_TABLET | Freq: Two times a day (BID) | ORAL | Status: DC | PRN
  Administered 2017-03-15: 50 mg via ORAL
  Filled 2017-03-13: qty 1

## 2017-03-13 MED ORDER — METOPROLOL SUCCINATE ER 25 MG PO TB24
25.0000 mg | ORAL_TABLET | Freq: Every day | ORAL | Status: DC
  Administered 2017-03-13 – 2017-03-15 (×3): 25 mg via ORAL
  Filled 2017-03-13 (×4): qty 1

## 2017-03-13 MED ORDER — SODIUM CHLORIDE 0.9 % IV SOLN
INTRAVENOUS | Status: DC
  Administered 2017-03-13: 08:00:00 via INTRAVENOUS

## 2017-03-13 MED ORDER — ONDANSETRON HCL 4 MG/2ML IJ SOLN
4.0000 mg | Freq: Three times a day (TID) | INTRAMUSCULAR | Status: DC | PRN
  Administered 2017-03-13: 4 mg via INTRAVENOUS
  Filled 2017-03-13: qty 2

## 2017-03-13 MED ORDER — SODIUM CHLORIDE 0.9 % IV BOLUS (SEPSIS)
1000.0000 mL | Freq: Once | INTRAVENOUS | Status: AC
  Administered 2017-03-13: 1000 mL via INTRAVENOUS

## 2017-03-13 MED ORDER — MORPHINE SULFATE (PF) 4 MG/ML IV SOLN: 1.0000 mg | INTRAVENOUS | Status: DC | PRN

## 2017-03-13 MED ORDER — FAMOTIDINE 20 MG PO TABS
20.0000 mg | ORAL_TABLET | Freq: Every day | ORAL | Status: DC
  Administered 2017-03-13 – 2017-03-15 (×3): 20 mg via ORAL
  Filled 2017-03-13 (×3): qty 1

## 2017-03-13 MED ORDER — LORAZEPAM 2 MG/ML IJ SOLN: 1.0000 mg | Freq: Four times a day (QID) | INTRAMUSCULAR | Status: DC | PRN

## 2017-03-13 MED ORDER — SELENIUM 50 MCG PO TABS
100.0000 ug | ORAL_TABLET | Freq: Every day | ORAL | Status: DC
  Administered 2017-03-13 – 2017-03-15 (×3): 100 ug via ORAL
  Filled 2017-03-13 (×4): qty 2

## 2017-03-13 MED ORDER — HYDRALAZINE HCL 20 MG/ML IJ SOLN: 5.0000 mg | INTRAMUSCULAR | Status: DC | PRN

## 2017-03-13 MED ORDER — THIAMINE HCL 100 MG/ML IJ SOLN
100.0000 mg | Freq: Every day | INTRAMUSCULAR | Status: DC
  Filled 2017-03-13: qty 2

## 2017-03-13 MED ORDER — LORAZEPAM 2 MG/ML IJ SOLN
0.0000 mg | Freq: Two times a day (BID) | INTRAMUSCULAR | Status: DC
  Administered 2017-03-15: 2 mg via INTRAVENOUS
  Administered 2017-03-16: 1 mg via INTRAVENOUS
  Filled 2017-03-13 (×2): qty 1

## 2017-03-13 MED ORDER — LORAZEPAM 1 MG PO TABS: 1.0000 mg | ORAL_TABLET | Freq: Four times a day (QID) | ORAL | Status: DC | PRN

## 2017-03-13 MED ORDER — ADULT MULTIVITAMIN W/MINERALS CH
1.0000 | ORAL_TABLET | Freq: Every day | ORAL | Status: DC
  Administered 2017-03-13 – 2017-03-15 (×3): 1 via ORAL
  Filled 2017-03-13 (×3): qty 1

## 2017-03-13 MED ORDER — FOLIC ACID 1 MG PO TABS
1.0000 mg | ORAL_TABLET | Freq: Every day | ORAL | Status: DC
  Administered 2017-03-13 – 2017-03-15 (×3): 1 mg via ORAL
  Filled 2017-03-13 (×3): qty 1

## 2017-03-13 MED ORDER — ALBUTEROL SULFATE (2.5 MG/3ML) 0.083% IN NEBU: 2.5000 mg | INHALATION_SOLUTION | Freq: Four times a day (QID) | RESPIRATORY_TRACT | Status: DC | PRN

## 2017-03-13 MED ORDER — LORAZEPAM 2 MG/ML IJ SOLN: 0.0000 mg | Freq: Four times a day (QID) | INTRAMUSCULAR | Status: AC

## 2017-03-13 MED ORDER — VITAMIN B-12 100 MCG PO TABS
50.0000 ug | ORAL_TABLET | Freq: Every day | ORAL | Status: DC
  Administered 2017-03-13 – 2017-03-15 (×3): 50 ug via ORAL
  Filled 2017-03-13 (×3): qty 1

## 2017-03-13 MED ORDER — IOPAMIDOL (ISOVUE-300) INJECTION 61%
INTRAVENOUS | Status: AC
  Filled 2017-03-13: qty 30

## 2017-03-13 MED ORDER — ENOXAPARIN SODIUM 40 MG/0.4ML ~~LOC~~ SOLN
40.0000 mg | SUBCUTANEOUS | Status: DC
  Administered 2017-03-13: 40 mg via SUBCUTANEOUS
  Filled 2017-03-13: qty 0.4

## 2017-03-13 MED ORDER — IOPAMIDOL (ISOVUE-370) INJECTION 76%
INTRAVENOUS | Status: AC
  Administered 2017-03-13: 100 mL
  Filled 2017-03-13: qty 100

## 2017-03-13 MED ORDER — ASPIRIN EC 325 MG PO TBEC
325.0000 mg | DELAYED_RELEASE_TABLET | Freq: Every day | ORAL | Status: DC
  Administered 2017-03-13 – 2017-03-14 (×2): 325 mg via ORAL
  Filled 2017-03-13 (×2): qty 1

## 2017-03-13 MED ORDER — ACETAMINOPHEN 325 MG PO TABS: 650.0000 mg | ORAL_TABLET | ORAL | Status: DC | PRN

## 2017-03-13 MED ORDER — AMLODIPINE BESYLATE 5 MG PO TABS
7.5000 mg | ORAL_TABLET | Freq: Every day | ORAL | Status: DC
  Administered 2017-03-13 – 2017-03-15 (×3): 7.5 mg via ORAL
  Filled 2017-03-13 (×3): qty 1

## 2017-03-13 MED ORDER — VITAMIN B-1 100 MG PO TABS
100.0000 mg | ORAL_TABLET | Freq: Every day | ORAL | Status: DC
  Administered 2017-03-13 – 2017-03-15 (×3): 100 mg via ORAL
  Filled 2017-03-13 (×2): qty 1

## 2017-03-13 NOTE — Progress Notes (Addendum)
83 yo male with avr,cad,ckd admitted with chest pain.echo done.cards foll.for leximyo view in am.ct chest NO PE.BUT SHOWS FREE AIR.[facility] .WILL GET CT ABDOMEN ...WITH PO CONTRAST.CONSULT SURGERY IF IT CONFIRMS FREE AIR.

## 2017-03-13 NOTE — Progress Notes (Signed)
Patient c/o nausea and sob earlier this am.  Zofran given for nausea without relief.  Respirations unlabored, VSS.  Patient requested Xanax for anxiety.  An hour after the xanax was administered, patient stated, "The Xanax fixed my breathing and my nausea."  He then proceeded to tell this nurse that he has been running out of Xanax at home on a monthly basis, because the dose was decreased to 0.25mg  bid.

## 2017-03-13 NOTE — Care Management Obs Status (Signed)
MEDICARE OBSERVATION STATUS NOTIFICATION   Patient Details  Name: Lafayette DragonJoseph A Brahmbhatt MRN: [id number] Date of Birth: 02-Aug-1926   Medicare Observation Status Notification Given:  Yes    Lawerance Sabalebbie Karrington Mccravy, RN 03/23/2017, 11:38 AM

## 2017-03-13 NOTE — ED Notes (Signed)
Pt still unable to provide urine sample 

## 2017-03-13 NOTE — ED Provider Notes (Signed)
[facility] EMERGENCY DEPARTMENT Provider Note   CSN: [id number] Arrival date & time: 03/24/2017  0302     History   Chief Complaint Chief Complaint  Patient presents with  . Chest Pain  . Shortness of Breath    HPI Samuel [name] is a 83 y.o. male.  HPI   Samuel [name] is a 83 y.o. male, with a history of AAA, alcohol abuse, aortic valve replacement, CHF, GERD, and HTN, presenting to the ED with chest pain beginning suddenly around 12am this morning. States he awoke because he thought he had to go to the bathroom. Felt left sided chest pressure, 7-8/10, nonradiating. Accompanied by shortness of breath and nausea.  Received 346m ASA and 1 NTG with EMS. Pain improved to 3/10, but became hypotensive to 86 systolic.    States he does not take any aspirin, despite it being prescribed.   Denies abdominal pain, cough, fever/chills, vomiting/diarrhea, dizziness, peripheral edema, or any other complaints.   Past Medical History:  Diagnosis Date  . Abdominal aortic aneurysm ([facility] 10/29/2011   3.7cm AAA by CT scan with unusual appearance suggestive of penetrating atherosclerotic ulcer or localized dissection  . Alcohol abuse   . Aortic stenosis    Echo 7/14:  Severe LVH, EF 55-60%, severe AS, mean gradient 78 mmHg, MAC, mild LAE  . Arthritis    "in the low vertebra"  . CHF (congestive heart failure) ([facility]   . Chronic lower back pain   . Complication of anesthesia    NO NECK LINES ON RIGHT FOR SURGERY  . COPD (chronic obstructive pulmonary disease) ([facility]   . Decreased libido   . Depression   . DIZZINESS, CHRONIC   . Essential hypertension, benign   . GERD (gastroesophageal reflux disease)    occ  . Heart murmur   . Perivalvular leak of prosthetic heart valve    echo (02/21/13): Mild LVH, EF 55-60%, aortic valve with mild to moderate perivalvular leak, AI, mean aortic valve gradient 6 mm of mercury, MAC.  . S/P TAVR (transcatheter aortic valve replacement)  01/21/2013   29 mm Edwards Sapien XT transcatheter heart valve placed via direct aortic approach through a right anterior mini-thoracotomy  . Scoliosis/kyphoscoliosis 10/28/2011  . Shortness of breath 10/24/11   "all the time"    Patient Active Problem List   Diagnosis Date Noted  . GERD (gastroesophageal reflux disease) 03/23/2017  . Chronic diastolic CHF (congestive heart failure) ([facility] 04/02/2017  . CKD (chronic kidney disease), stage III ([facility] 03/12/2017  . Anxiety 03/14/2017  . Decreased mobility 11/27/2016  . Medicare annual wellness visit, subsequent 07/31/2016  . Acute upper respiratory infection 03/09/2016  . Nausea without vomiting 04/06/2015  . Protein-calorie malnutrition, severe ([facility] 02/24/2015  . Dehydration 02/23/2015  . H/O aortic valve replacement 04/14/2013  . Osteoarthritis of right hip 01/29/2013  . 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 Aortic stenosis, severe 11/20/2011  . Abdominal aortic aneurysm ([facility] 10/29/2011  . Scoliosis/kyphoscoliosis 10/28/2011  . Alcohol intoxication ([facility] 10/24/2011  . Physical deconditioning 10/24/2011  . Generalized weakness 10/24/2011  . Altered mental status 10/24/2011  . Fever and chills 10/24/2011  . Dysphagia 10/24/2011  . Essential hypertension, benign 03/04/2010  . AORTIC STENOSIS 09/03/2008  . COPD with chronic bronchitis ([facility] 09/03/2008  . DEPRESSION 07/19/2007  . CELLULITIS AND ABSCESS OF FOOT EXCEPT TOES 07/19/2007  . DIZZINESS, CHRONIC 07/11/2007  . DECREASED LIBIDO 07/11/2007  . Alcohol abuse 07/08/2007  . HEADACHE 07/08/2007  .  HEART MURMUR 07/11/2006  . DYSPNEA ON EXERTION 07/11/2006  . RLQ PAIN 07/11/2006    Past Surgical History:  Procedure Laterality Date  . INTRAOPERATIVE TRANSESOPHAGEAL ECHOCARDIOGRAM N/A 01/21/2013   Procedure: INTRAOPERATIVE TRANSESOPHAGEAL ECHOCARDIOGRAM;  Surgeon: Rexene Alberts, MD;  Location: [facility];  Service: Open Heart Surgery;  Laterality: N/A;  .  LEFT AND RIGHT HEART CATHETERIZATION WITH CORONARY ANGIOGRAM N/A 10/27/2011   Procedure: LEFT AND RIGHT HEART CATHETERIZATION WITH CORONARY ANGIOGRAM;  Surgeon: Hillary Bow, MD;  Location: [facility] CATH LAB;  Service: Cardiovascular;  Laterality: N/A;  . NO PAST SURGERIES    . THORACOTOMY Right 01/21/2013   Procedure: MINI/LIMITED THORACOTOMY;  Surgeon: Rexene Alberts, MD;  Location: [facility];  Service: Open Heart Surgery;  Laterality: Right;  . TRANSCATHETER AORTIC VALVE REPLACEMENT, TRANSAORTIC N/A 01/21/2013   Procedure: TRANSCATHETER AORTIC VALVE REPLACEMENT, TRANSAORTIC;  Surgeon: Rexene Alberts, MD;  Location: [facility];  Service: Open Heart Surgery;  Laterality: N/A;       Home Medications    Prior to Admission medications   Medication Sig Start Date End Date Taking? Authorizing Provider  ALPRAZolam Duanne Moron) 0.5 MG tablet Take 1 tablet (0.5 mg total) 2 (two) times daily as needed by mouth. for anxiety 02/14/17   Lance Sell, NP  amLODipine (NORVASC) 5 MG tablet Take 1.5 tablets (7.5 mg total) by mouth daily. 07/31/16   Golden Circle, FNP  aspirin EC 325 MG tablet Take 1 tablet (325 mg total) by mouth every other day. 08/17/14   Lelon Perla, MD  Bioflavonoid Products (ESTER C PO) Take 1,000 mg by mouth daily.    [provider]  Cholecalciferol (VITAMIN D-3) 5000 UNITS TABS Take 1 capsule by mouth.    [provider]  furosemide (LASIX) 20 MG tablet Take 2 tablets (40 mg total) by mouth daily. 12/05/16   Lelon Perla, MD  metoprolol succinate (TOPROL-XL) 25 MG 24 hr tablet TAKE 1/2 TABLET BY MOUTH DAILY FOR BLOOD PRESSURE 01/31/17   Lance Sell, NP  Multiple Vitamin (MULTIVITAMIN WITH MINERALS) TABS Take 1 tablet by mouth daily. 10/30/11   Velvet Bathe, MD  ondansetron (ZOFRAN-ODT) 4 MG disintegrating tablet Take 1 tablet (4 mg total) by mouth every 8 (eight) hours as needed for nausea or vomiting. 11/27/16   Golden Circle, FNP  POTASSIUM  GLUCONATE ER PO Take by mouth daily. OTC    [provider]  ranitidine (ZANTAC) 150 MG capsule Take 1 capsule (150 mg total) by mouth daily. 12/15/16   Fatima Blank, MD  SELENIUM PO Take 1 tablet by mouth daily.    [provider]  Specialty Vitamins Products (MAGNESIUM, AMINO ACID CHELATE,) 133 MG tablet Take 1 tablet by mouth 2 (two) times daily.    [provider]  traMADol (ULTRAM) 50 MG tablet Take 1 tablet (50 mg total) 2 (two) times daily as needed by mouth. 02/14/17   Lance Sell, NP  vitamin B-12 (CYANOCOBALAMIN) 100 MCG tablet Take 50 mcg by mouth daily.      [provider]    Family History Family History  Problem Relation Age of Onset  . Heart failure Mother 31  . Thyroid disease Mother   . Heart attack Father 79  . Stroke Father   . Other Unknown        no known medical hx of heart disease  . Heart attack Brother 69  . Diabetes Brother   . Heart attack Brother 34  .  Diabetes Sister   . Hypertension Sister     Social History Social History   Tobacco Use  . Smoking status: Former Smoker    Years: 2.50    Types: Cigarettes    Last attempt to quit: 07/02/1952    Years since quitting: 64.7  . Smokeless tobacco: Never Used  . Tobacco comment: wine x2 q nite in last week 01/17/13  Substance Use Topics  . Alcohol use: Yes    Alcohol/week: 1.8 - 3.6 oz    Types: 3 - 6 Cans of beer per week    Comment: quite a history of alcohol abuse for many years, states currently drinks beer weekly but not much  . Drug use: No     Allergies   Patient has no known allergies.   Review of Systems Review of Systems  Constitutional: Negative for chills, diaphoresis and fever.  Respiratory: Positive for shortness of breath. Negative for cough.   Cardiovascular: Positive for chest pain. Negative for palpitations and leg swelling.  Gastrointestinal: Positive for nausea. Negative for abdominal pain, diarrhea and vomiting.    Neurological: Negative for dizziness, syncope, weakness, light-headedness, numbness and headaches.  All other systems reviewed and are negative.    Physical Exam Updated Vital Signs BP (!) 124/59 (BP Location: Right Arm)   Pulse 65   Temp 98.9 F (37.2 C) (Oral)   Resp (!) 21   SpO2 95%   Physical Exam  Constitutional: He appears cachectic. No distress.  HENT:  Head: Normocephalic and atraumatic.  Eyes: Conjunctivae are normal.  Neck: Neck supple.  Cardiovascular: Normal rate, regular rhythm, normal heart sounds and intact distal pulses.  Pulmonary/Chest: Effort normal and breath sounds normal. No respiratory distress.  Abdominal: Soft. There is no tenderness. There is no guarding.  Musculoskeletal: He exhibits no edema.  Lymphadenopathy:    He has no cervical adenopathy.  Neurological: He is alert.  Skin: Skin is warm and dry. He is not diaphoretic.  Psychiatric: He has a normal mood and affect. His behavior is normal.  Nursing note and vitals reviewed.    ED Treatments / Results  Labs (all labs ordered are listed, but only abnormal results are displayed) Labs Reviewed  BASIC METABOLIC PANEL - Abnormal; Notable for the following components:      Result Value   Sodium 130 (*)    Chloride 91 (*)    Glucose, Bld 114 (*)    Creatinine, Ser 1.39 (*)    Calcium 8.4 (*)    GFR calc non Af Amer 44 (*)    GFR calc Af Amer 51 (*)    All other components within normal limits  HEPATIC FUNCTION PANEL - Abnormal; Notable for the following components:   Albumin 3.4 (*)    AST 43 (*)    ALT 13 (*)    All other components within normal limits  CBC  URINALYSIS, ROUTINE W REFLEX MICROSCOPIC  D-DIMER, QUANTITATIVE (NOT AT [facility])  I-STAT TROPONIN, ED    EKG  EKG Interpretation  Date/Time:  Tuesday March 13 2017 03:03:37 EST Ventricular Rate:  72 PR Interval:    QRS Duration: 101 QT Interval:  455 QTC Calculation: 498 R Axis:   70 Text Interpretation:  Age not  entered, assumed to be  83 years old for purpose of ECG interpretation Sinus rhythm Nonspecific T abnrm, anterolateral leads Borderline prolonged QT interval When compared with ECG of 12/15/2016, QT has lengthened Confirmed by Delora Fuel ([id number]) on 03/25/2017 3:31:06 AM  Radiology Dg Chest 2 View  Result Date: 03/29/2017 CLINICAL DATA:  Chest pain and dyspnea tonight EXAM: CHEST  2 VIEW COMPARISON:  12/15/2016 FINDINGS: Unchanged severe kyphoscoliosis. The lungs are clear. No pleural effusions. Normal pulmonary vasculature. Prior trans catheter aortic valvuloplasty. IMPRESSION: No acute cardiopulmonary findings.  Severe kyphoscoliosis. Electronically Signed   By: Andreas Newport M.D.   On: 03/11/2017 03:59    Procedures Procedures (including critical care time)  Medications Ordered in ED Medications  sodium chloride 0.9 % bolus 1,000 mL (1,000 mLs Intravenous New Bag/Given 03/05/2017 0502)  ondansetron (ZOFRAN) injection 4 mg (4 mg Intravenous Given 03/08/2017 0430)  morphine 4 MG/ML injection 2 mg (2 mg Intravenous Given 03/08/2017 0503)     Initial Impression / Assessment and Plan / ED Course  I have reviewed the triage vital signs and the nursing notes.  Pertinent labs & imaging results that were available during my care of the patient were reviewed by me and considered in my medical decision making (see chart for details).  Clinical Course as of Mar 14 503  Tue Mar 13, 2017  0501 Spoke with Dr. Blaine Hamper, hospitalist. Agrees to admit the patient.  [SJ]    Clinical Course User Index [SJ] Rebie Peale C, PA-C    Patient presents with chest pain and shortness of breath with suspicious story. HEART score of 5. Improvement during ED course. Admission for chest pain rule out.  Last known cardiac cath was 4 years ago with patient's aortic valve replacement.  Findings and plan of care discussed with Delora Fuel, MD. Dr. Roxanne Mins personally evaluated and examined this patient.  Vitals:     03/04/2017 0314 03/05/2017 0315 03/26/2017 0345 03/31/2017 0400  BP:  (!) 128/55 (!) 144/58 (!) 143/56  Pulse: 65 63 65 65  Resp: (!) 21 20 (!) 25 16  Temp:      TempSrc:      SpO2: 95% 95% 95% 96%     Final Clinical Impressions(s) / ED Diagnoses   Final diagnoses:  Chest pain, unspecified type    ED Discharge Orders    None       Layla Maw 82/50/03 [id number]    Delora Fuel, MD 88/91/69 [phone]

## 2017-03-13 NOTE — Consult Note (Signed)
Cardiology Consultation:   Patient ID: Samuel [name]; [id number]; 03/15/1927   Admit date: 03/27/2017 Date of Consult: 03/31/2017  Primary Care Provider: Golden Circle, FNP Primary Cardiologist: Stanford Breed   Patient Profile:   Samuel [name] is a 83 y.o. male with a hx of nonobstructive CAD by [facility] 10/2011, severe aortic stenosis s/p TAVR 01/21/2013, infrarenal abdominal aortic saccular aneurysm measuring 5.1 x 4 cm by CTA 11/9371, diastolic dysfunction, CKD stage II,  HTN, EtOH abuse, COPD 2/2 prior tobacco abuse, chronic low back pain, and GERD who is being seen today for the evaluation of chest pain and SOB at the request of Dr. Blaine Hamper.  History of Present Illness:   Samuel [name] was admitted 10/2011 with volume overload and symptomatic AS. LHC demonstrated luminal irregs but no significant CAD. Echo 10/2011: Mild LVH, EF 60-65%, Gr1DD, mod AS, mean 33 (this was felt to be worse than measured), MAC, PASP 32. He was seen in 10/2012 with worsening symptoms and follow up echo demonstrated normal LVF and severe AS with mean gradient of 78 mmHg. He was referred to Dr. Roxy Manns for aortic valve surgery. Note, patient has severe tortuosity of his aorta and small infrarenal abdominal aortic aneurysm measuring 3.7 cm by CT 7/13. AAA duplex on 04/23/14 showed AAA 3.0 X 2.8 cm. He was not felt to be a candidate for TAVR via transfemoral approach. It was felt that he would need a direct aortic approach. He was admitted 10/14 and underwent transaortic valve replacement via transaortic approach via right anterior minithoracotomy. Follow up echo (02/21/13): Mild LVH, EF 55-60%, aortic valve with mild to moderate perivalvular leak, AI, mean aortic valve gradient 6 mm of mercury, MAC. Most recent echo from 09/2015 showed EF 60-65%, no RWMA, Gr1DD, AVR with mean gradient of 7 mmHg, mild to moderate aortic insufficiency, severely calcified mitral annulus. Abdominal ultrasound March 2017 showed abdominal aortic aneurysm  measuring 4 x 4.3 cm. CTA recommended. CTA June 2017 showed enlarging infrarenal abdominal aortic saccular aneurysm measuring 5.1 x 4 cm. He was most recently seen by Dr. Stanford Breed in 11/2015 and was doing well at that time. He has since undergone CTA of the abdomen on 11/21/2016 that showed increased size of the infrarenal aortic aneurysm, measuring 5.2 x 4.6 cm.  Patient was in his usual state of health until the evening of 12/10 when he developed dull, left-sided chest pain without radiation and associated SOB and nausea. Pain lasted ~ 1 hour before he called EMS. Has never had pain like this before. Pain was rated at 9/10 and he was not certain if there was a pleuritic component or if the pain was reproducible to palpation. He did not do any increased activities leading up to these events. He reports a fairly sedentary lifestyle for the past several years. He reports drinking "too much" the night prior. He reports a long history of Xanax for SOB. This prescription was recently cut in half by PCP per his report.   Upon the patient's arrival to the hospital he was found to have stable vital signs with midlly elevated BP currently in the 428J mmHg systolic, oxygen saturation in the mid 90s on room air, weight 140 pounds. EKG with nonspecific changes as below, CXR showed no acute cardiopulmonary process. CTA chest was negative for PE. Scattered free air was noted in the upper abdomen, concerning for bowel perforation. Chronic esophagitis and severe right convex thoracic scoliosis was noted. Labs showed troponin negative x 2, d-dimer 2.54,  BNP 282.1, unremarkable cbc, Na 130, K+ 4.2, BUN 18, SCr 1.39 (baseline ~ 1.0-1.2), glucose 114, albumin 3.4, AST 43, UA small hgb. He received SL NTG with subsequent hypotension. He has been given a NS bolus and started on IV fluids at 75 mL/hr. He has been given morphine and Zofran. Currently, chest pain and SOB free.   Past Medical History:  Diagnosis Date  . Abdominal  aortic aneurysm ([place]) 11/21/2016   5.2 x 4.6   . Alcohol abuse   . Arthritis    "in the low vertebra"  . CHF (congestive heart failure) ([place])   . Chronic lower back pain   . Complication of anesthesia    NO NECK LINES ON RIGHT FOR SURGERY  . COPD (chronic obstructive pulmonary disease) ([place])   . Depression   . DIZZINESS, CHRONIC   . Essential hypertension, benign   . GERD (gastroesophageal reflux disease)    occ  . Perivalvular leak of prosthetic heart valve    echo (02/21/13): Mild LVH, EF 55-60%, aortic valve with mild to moderate perivalvular leak, AI, mean aortic valve gradient 6 mm of mercury, MAC.  . 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 Scoliosis/kyphoscoliosis 10/28/2011  . Shortness of breath 10/24/11   "all the time"    Past Surgical History:  Procedure Laterality Date  . INTRAOPERATIVE TRANSESOPHAGEAL ECHOCARDIOGRAM N/A 01/21/2013   Procedure: INTRAOPERATIVE TRANSESOPHAGEAL ECHOCARDIOGRAM;  Surgeon: Rexene Alberts, MD;  Location: [place];  Service: Open Heart Surgery;  Laterality: N/A;  . LEFT AND RIGHT HEART CATHETERIZATION WITH CORONARY ANGIOGRAM N/A 10/27/2011   Procedure: LEFT AND RIGHT HEART CATHETERIZATION WITH CORONARY ANGIOGRAM;  Surgeon: Hillary Bow, MD;  Location: [facility] CATH LAB;  Service: Cardiovascular;  Laterality: N/A;  . NO PAST SURGERIES    . THORACOTOMY Right 01/21/2013   Procedure: MINI/LIMITED THORACOTOMY;  Surgeon: Rexene Alberts, MD;  Location: [place];  Service: Open Heart Surgery;  Laterality: Right;  . TRANSCATHETER AORTIC VALVE REPLACEMENT, TRANSAORTIC N/A 01/21/2013   Procedure: TRANSCATHETER AORTIC VALVE REPLACEMENT, TRANSAORTIC;  Surgeon: Rexene Alberts, MD;  Location: [place];  Service: Open Heart Surgery;  Laterality: N/A;     Home Meds: Prior to Admission medications   Medication Sig Start  Date End Date Taking? Authorizing Provider  ALPRAZolam Duanne Moron) 0.5 MG tablet Take 1 tablet (0.5 mg total) 2 (two) times daily as needed by mouth. for anxiety Patient taking differently: Take 0.25 mg by mouth 2 (two) times daily as needed (breathing). for anxiety 02/14/17  Yes Shambley, Delphia Grates, NP  amLODipine (NORVASC) 5 MG tablet Take 1.5 tablets (7.5 mg total) by mouth daily. Patient taking differently: Take 5 mg by mouth daily.  07/31/16  Yes Golden Circle, FNP  Bioflavonoid Products (ESTER C PO) Take 1,000 mg by mouth daily.   Yes [provider]  Cholecalciferol (VITAMIN D-3) 5000 UNITS TABS Take 1 capsule by mouth daily.    Yes [provider]  furosemide (LASIX) 20 MG tablet Take 2 tablets (40 mg total) by mouth daily. Patient taking differently: Take 20 mg by mouth daily.  12/05/16  Yes Lelon Perla, MD  metoprolol succinate (TOPROL-XL) 25 MG 24 hr tablet TAKE 1/2 TABLET BY MOUTH DAILY FOR BLOOD PRESSURE 01/31/17  Yes Lance Sell, NP  Multiple Vitamin (MULTIVITAMIN WITH MINERALS) TABS Take 1 tablet by mouth daily. 10/30/11  Yes Velvet Bathe, MD  ondansetron (ZOFRAN-ODT) 4 MG disintegrating tablet Take 1 tablet (4 mg total) by mouth every 8 (eight) hours as needed for nausea or vomiting. 11/27/16  Yes Golden Circle, FNP  POTASSIUM GLUCONATE ER PO Take 1 tablet by mouth daily as needed (when taking furosemide). OTC   Yes [provider]  SELENIUM PO Take 1 tablet by mouth daily.   Yes [provider]  Specialty Vitamins Products (MAGNESIUM, AMINO ACID CHELATE,) 133 MG tablet Take 1 tablet by mouth daily as needed (when taking furosemide).    Yes [provider]  traMADol (ULTRAM) 50 MG tablet Take 1 tablet (50 mg total) 2 (two) times daily as needed by mouth. Patient taking differently: Take 50 mg by mouth every 12 (twelve) hours as needed for moderate pain.  02/14/17  Yes Lance Sell, NP  vitamin B-12 (CYANOCOBALAMIN)  100 MCG tablet Take 50 mcg by mouth daily.     Yes [provider]    Inpatient Medications: Scheduled Meds: . amLODipine  7.5 mg Oral Daily  . aspirin EC  325 mg Oral Daily  . cholecalciferol  5,000 Units Oral Daily  . enoxaparin (LOVENOX) injection  40 mg Subcutaneous Q24H  . famotidine  20 mg Oral Daily  . folic acid  1 mg Oral Daily  . LORazepam  0-4 mg Intravenous Q6H   Followed by  . [START ON 03/22/2017] LORazepam  0-4 mg Intravenous Q12H  . magnesium oxide  200 mg Oral BID  . metoprolol succinate  25 mg Oral Daily  . multivitamin with minerals  1 tablet Oral Daily  . selenium  100 mcg Oral Daily  . thiamine  100 mg Oral Daily   Or  . thiamine  100 mg Intravenous Daily  . vitamin B-12  50 mcg Oral Daily   Continuous Infusions: . sodium chloride 75 mL/hr at 03/09/2017 0740   PRN Meds: acetaminophen, albuterol, ALPRAZolam, dextromethorphan-guaiFENesin, hydrALAZINE, LORazepam **OR** LORazepam, morphine injection, ondansetron (ZOFRAN) IV, traMADol, zolpidem  Allergies:  No Known Allergies  Social History:   Social History   Socioeconomic History  . Marital status: Widowed    Spouse name: Not on file  . Number of children: 0  . Years of education: 30  . Highest education level: Not on file  Social Needs  . Financial resource strain: Not on file  . Food insecurity - worry: Not on file  . Food insecurity - inability: Not on file  . Transportation needs - medical: Not on file  . Transportation needs - non-medical: Not on file  Occupational History  . Occupation: Retired  Tobacco Use  . Smoking status: Former Smoker    Years: 2.50    Types: Cigarettes    Last attempt to quit: 07/02/1952    Years since quitting: 64.7  . Smokeless tobacco: Never Used  . Tobacco comment: wine x2 q nite in last week 01/17/13  Substance and Sexual Activity  . Alcohol use: Yes    Alcohol/week: 1.8 - 3.6 oz    Types: 3 - 6 Cans of beer per week    Comment: quite a history of  alcohol abuse for many years, states currently drinks beer weekly but not much  . Drug use: No  . Sexual activity: No  Other Topics Concern  . Not on file  Social History Narrative   Retired worked for [employer] in [place]   Divorced: 05/10/2022 and 2nd died and 30rd was separated  Never Smoked   Alcohol use -yes   Lives alone;     Family History:  Family History  Problem Relation Age of Onset  . Heart failure Mother 65  . Thyroid disease Mother   . Heart attack Father 20  . Stroke Father   . Other Unknown        no known medical hx of heart disease  . Heart attack Brother 23  . Diabetes Brother   . Heart attack Brother 39  . Diabetes Sister   . Hypertension Sister     ROS:  Review of Systems  Constitutional: Positive for malaise/fatigue. Negative for chills, diaphoresis, fever and weight loss.  HENT: Negative for congestion.   Eyes: Negative for discharge and redness.  Respiratory: Positive for shortness of breath. Negative for cough, hemoptysis, sputum production and wheezing.   Cardiovascular: Positive for chest pain. Negative for palpitations, orthopnea, claudication, leg swelling and PND.  Gastrointestinal: Positive for constipation and nausea. Negative for abdominal pain, blood in stool, diarrhea, heartburn, melena and vomiting.  Genitourinary: Negative for hematuria.  Musculoskeletal: Negative for falls and myalgias.  Skin: Negative for rash.  Neurological: Positive for weakness. Negative for dizziness, tingling, tremors, sensory change, speech change, focal weakness and loss of consciousness.  Endo/Heme/Allergies: Does not bruise/bleed easily.  Psychiatric/Behavioral: Positive for substance abuse. The patient is nervous/anxious.   All other systems reviewed and are negative.     Physical Exam/Data:   Vitals:   03/18/2017 0530 03/04/2017 0600 04/02/2017 0650 03/08/2017 0847  BP:  132/78 (!) 150/51 (!) 133/52  Pulse:  72 79 78  Resp:  17    Temp:   97.6 F  (36.4 C)   TempSrc:   Axillary Oral  SpO2: 95% 94% 96% 95%  Weight:   140 lb 8 oz (63.7 kg)   Height:   _0  (1.727 m)     Intake/Output Summary (Last 24 hours) at 03/05/2017 0901 Last data filed at 03/07/2017 0740 Gross per 24 hour  Intake 1500 ml  Output 50 ml  Net 1450 ml   Filed Weights   03/24/2017 0650  Weight: 140 lb 8 oz (63.7 kg)   Body mass index is 21.36 kg/m.   Physical Exam: General: Frail appearing, in no acute distress. Head: Normocephalic, atraumatic, sclera non-icteric, no xanthomas, nares without discharge.  Neck: Negative for carotid bruits. JVD not elevated. Lungs: Clear bilaterally to auscultation without wheezes, rales, or rhonchi. Breathing is unlabored. Heart: RRR with S1 S2. II/VI diastolic murmur LUSB, no rubs, or gallops appreciated. Abdomen: Soft, non-tender, non-distended with normoactive bowel sounds. No hepatomegaly. No rebound/guarding. No obvious abdominal masses. Msk:  Strength and tone appear normal for age. Extremities: No clubbing or cyanosis. No edema. Distal pedal pulses are 2+ and equal bilaterally. Neuro: Alert and oriented X 3. No facial asymmetry. No focal deficit. Moves all extremities spontaneously. Psych:  Responds to questions appropriately with a normal affect.   EKG:  The EKG was personally reviewed and demonstrates: NSR, 72 bpm, nonspecific anterolateral st/t changes Telemetry:  Telemetry was personally reviewed and demonstrates: NSR  Weights: Filed Weights   03/22/2017 0650  Weight: 140 lb 8 oz (63.7 kg)    Relevant CV Studies: TTE 09/07/2015: Study Conclusions  - Left ventricle: The cavity size was normal. There was mild focal   basal hypertrophy of the septum. Systolic function was normal.   The estimated ejection fraction was in the range of 60% to 65%.   Wall motion was normal; there were  no regional wall motion   abnormalities. There was an increased relative contribution of   atrial contraction to ventricular  filling. Doppler parameters are   consistent with abnormal left ventricular relaxation (grade 1   diastolic dysfunction). Doppler parameters are consistent with   high ventricular filling pressure. - Aortic valve: The valve is poorly visualized. There is an Facilities manager in place. The mean AV gradient is 1mHg. There is   mild to moderate paravalvular AR. There was mild regurgitation.   Mean gradient (S): 7 mm Hg. Regurgitation pressure half-time: 492   ms. - Mitral valve: Severely calcified annulus.  Coronary CT 12/25/2012: Impression:  1) Calcified tri-leaflet AV with VBA area of 552 mm2.  Suitable  for a soft 226mSapien XT valve. 2) Marked Scoliosis with tortuous thoracic aorta making transfemoral approach with a large valve seem unlikely to be successful Trans aortic approach may be best with care to avoid the RIMA. 3) No critical CAD with ostial origins sufficiently high to place stented valve.  Laboratory Data:  Chemistry Recent Labs  Lab 03/12/2017 0308  NA 130*  K 4.2  CL 91*  CO2 25  GLUCOSE 114*  BUN 18  CREATININE 1.39*  CALCIUM 8.4*  GFRNONAA 44*  GFRAA 51*  ANIONGAP 14    Recent Labs  Lab 03/06/2017 0308  PROT 7.4  ALBUMIN 3.4*  AST 43*  ALT 13*  ALKPHOS 91  BILITOT 0.9   Hematology Recent Labs  Lab 04/01/2017 0308  WBC 9.2  RBC 4.57  HGB 14.2  HCT 40.5  MCV 88.6  MCH 31.1  MCHC 35.1  RDW 12.2  PLT 170   Cardiac Enzymes Recent Labs  Lab 03/04/2017 0512  TROPONINI <0.03    Recent Labs  Lab 03/05/2017 0314  TROPIPOC 0.01    BNP Recent Labs  Lab 03/09/2017 0512  BNP 282.1*    DDimer  Recent Labs  Lab 03/18/2017 0308  DDIMER 2.54*    Radiology/Studies:  Dg Chest 2 View  Result Date: 03/22/2017 IMPRESSION: No acute cardiopulmonary findings.  Severe kyphoscoliosis. Electronically Signed   By: DaAndreas Newport.D.   On: 03/22/2017 03:59   Ct Angio Chest Pe W Or Wo Contrast  Result Date: 03/21/2017 IMPRESSION: 1. No  evidence of pulmonary embolus. 2. Scattered free air noted at the upper abdomen, concerning for bowel perforation. The location of perforation is not characterized on this study. CT of the abdomen and pelvis is recommended for further evaluation. 3. Diffuse wall thickening along the course of the esophagus may reflect chronic inflammation or esophagitis. 4. Severe right convex thoracic scoliosis noted. Aortic Atherosclerosis (ICD10-I70.0). Critical Value/emergent results were called by telephone at the time of interpretation on 03/20/2017 at 6:47 am to DeCoffman Coven MCOakland Surgicenter Incwho verbally acknowledged these results. Electronically Signed   By: JeGarald Balding.D.   On: 03/23/2017 06:50    Assessment and Plan:   1. Chest pain with moderate risk for cardiac etiology: -Currently, symptom free -Last ischemic evaluation 10/2011 -Cycle third troponin to rule out -Schedule Lexiscan Myoivew for AM of 12/12 to evaluate for high-risk ischemia (unable to treadmill 2/2 generalized weakness and frail state) -ASA -Toprol  -Lipid and A1c pending for further risk stratification   2. Diastolic dysfunction: -He does not appear volume overloaded -Echo pending -Lasix on hold given AKI  3. Severe aortic stenosis s/p AVR: -Echo pending  4. Abnormal CT/possible free air noted in the abdomen: -Defer CT abdomen/pelvis to IM  5. Acute on CKD stage II: -Gentle IV fluids  6. Hyponatremia: -Likely 2/2 alcohol abuse and Lasix -Lasix on hold given AKI  7. Alcohol abuse: -CIWA protocol -Check magnesium with recommendation to replete to goal of > 2.0  8. AAA: -CTA abdomen in 11/2016 showed increased size of AAA as detailed in the HPI with recommendation to follow up with CTA abdomen/pelvis in 3-6 months from that time -Consider follow up imaging and vascular surgery consult  9. Hypertensive heart disease: -Amlodipine and Toprol -Titrate as needed    For questions or updates, please contact [place] Please consult [email or web address] for contact info under Cardiology/STEMI.   Signed, Christell Faith, PA-C [facility] Pager: [phone] 03/24/2017, 9:01 AM  Patient examined chart reviewed. Disheveled white male alcoholic Atypical but recurrent chest pain r/o Exam with patient pointing to pain over left breast. SEM through TAVR valve no AR murmur. Cath 2013 With no CAD. Will order echo and lexiscan for am Doubt he will need heart catheterization   Jenkins Rouge

## 2017-03-13 NOTE — ED Notes (Signed)
Patient transported to X-ray 

## 2017-03-13 NOTE — ED Triage Notes (Signed)
PT BIB [facility] for shortness of breath and chest pain. Pt is c/o left sided chest pain that started 1.5 PTA and SHOB. Pt was given 324 ASA and 1 nitro. Pt was hypertensive and after nitro became hypotensive at 86/66. Did help pain. Normotensive during triage

## 2017-03-13 NOTE — Progress Notes (Signed)
  Echocardiogram 2D Echocardiogram has been performed.  Samuel SavoyCasey N Tilmon [name] Feb 09, 2017, 2:45 PM

## 2017-03-13 NOTE — H&P (Signed)
History and Physical    Samuel [name] EXH:[id number] DOB: [birth year] DOA: 03/10/2017  Referring MD/NP/PA:   PCP: Golden Circle, FNP   Patient coming from:  The patient is coming from home.  At baseline, pt is independent for most of ADL.   Chief Complaint: chest pain and SOB  HPI: Samuel [name] is a 83 y.o. male with medical history significant of hypertension, COPD, GERD, depression, anxiety, aortic stenosis, s/p of TAVR, CHF, alcohol abuse, AAA, CKD2, kyphoscoliosis, who presents with chest pain or shortness of breath.  Patient states that his chest pain started in the middle night. It is located in the left central chest, 9 out of 10 in severity, sharp, nonradiating. He cannot tell if it is pleuritic on not. Denies tenderness in the calf areas. He also reports shortness of breath and cough with white phlegm production. No fever or chills. Per report, patient was given nitroglycerin which caused the hypotension, blood pressure dropped to 86/66. Patient received 1 L of saline bolus in ED, blood pressure came back to 143/56. His chest pain has subsided after treated with nitroglycerin. Currently he is chest pain-free, but still has SOB. Patient states that his shortness breath is worse than his baseline. He has nausea, no vomiting, diarrhea or abdominal pain. Denies symptoms of UTI or unilateral weakness.  ED Course: pt was found to have negative troponin, WBC 9.2, sodium 1:30, worsening renal function, temperature normal, no tachycardia, oxygen saturation 794% on room air, chest x-rays negative for acute abnormalities. Patient is placed on telemetry bed for observation.  Review of Systems:   General: no fevers, chills, has poor appetite, has fatigue HEENT: no blurry vision, hearing changes or sore throat Respiratory: has dyspnea, coughing, no wheezing CV: has chest pain, no palpitations GI: has nausea, no vomiting, abdominal pain, diarrhea, constipation GU: no dysuria, burning on  urination, increased urinary frequency, hematuria  Ext: no leg edema Neuro: no unilateral weakness, numbness, or tingling, no vision change or hearing loss Skin: no rash, no skin tear. MSK: No muscle spasm, no deformity, no limitation of range of movement in spin Heme: No easy bruising.  Travel history: No recent long distant travel.  Allergy: No Known Allergies  Past Medical History:  Diagnosis Date  . Abdominal aortic aneurysm ([place]) 10/29/2011   3.7cm AAA by CT scan with unusual appearance suggestive of penetrating atherosclerotic ulcer or localized dissection  . Alcohol abuse   . Aortic stenosis    Echo 7/14:  Severe LVH, EF 55-60%, severe AS, mean gradient 78 mmHg, MAC, mild LAE  . Arthritis    "in the low vertebra"  . CHF (congestive heart failure) ([place])   . Chronic lower back pain   . Complication of anesthesia    NO NECK LINES ON RIGHT FOR SURGERY  . COPD (chronic obstructive pulmonary disease) ([place])   . Decreased libido   . Depression   . DIZZINESS, CHRONIC   . Essential hypertension, benign   . GERD (gastroesophageal reflux disease)    occ  . Heart murmur   . Perivalvular leak of prosthetic heart valve    echo (02/21/13): Mild LVH, EF 55-60%, aortic valve with mild to moderate perivalvular leak, AI, mean aortic valve gradient 6 mm of mercury, MAC.  . 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 Scoliosis/kyphoscoliosis 10/28/2011  . Shortness of breath 10/24/11   "  all the time"    Past Surgical History:  Procedure Laterality Date  . INTRAOPERATIVE TRANSESOPHAGEAL ECHOCARDIOGRAM N/A 01/21/2013   Procedure: INTRAOPERATIVE TRANSESOPHAGEAL ECHOCARDIOGRAM;  Surgeon: Rexene Alberts, MD;  Location: [facility];  Service: Open Heart Surgery;  Laterality: N/A;  . LEFT AND RIGHT HEART CATHETERIZATION WITH CORONARY ANGIOGRAM N/A 10/27/2011    Procedure: LEFT AND RIGHT HEART CATHETERIZATION WITH CORONARY ANGIOGRAM;  Surgeon: Hillary Bow, MD;  Location: [facility] CATH LAB;  Service: Cardiovascular;  Laterality: N/A;  . NO PAST SURGERIES    . THORACOTOMY Right 01/21/2013   Procedure: MINI/LIMITED THORACOTOMY;  Surgeon: Rexene Alberts, MD;  Location: [facility];  Service: Open Heart Surgery;  Laterality: Right;  . TRANSCATHETER AORTIC VALVE REPLACEMENT, TRANSAORTIC N/A 01/21/2013   Procedure: TRANSCATHETER AORTIC VALVE REPLACEMENT, TRANSAORTIC;  Surgeon: Rexene Alberts, MD;  Location: [facility];  Service: Open Heart Surgery;  Laterality: N/A;    Social History:  reports that he quit smoking about 64 years ago. His smoking use included cigarettes. He quit after 2.50 years of use. he has never used smokeless tobacco. He reports that he drinks about 1.8 - 3.6 oz of alcohol per week. He reports that he does not use drugs.  Family History:  Family History  Problem Relation Age of Onset  . Heart failure Mother 39  . Thyroid disease Mother   . Heart attack Father 18  . Stroke Father   . Other Unknown        no known medical hx of heart disease  . Heart attack Brother 74  . Diabetes Brother   . Heart attack Brother 102  . Diabetes Sister   . Hypertension Sister      Prior to Admission medications   Medication Sig Start Date End Date Taking? Authorizing Provider  ALPRAZolam Duanne Moron) 0.5 MG tablet Take 1 tablet (0.5 mg total) 2 (two) times daily as needed by mouth. for anxiety 02/14/17   Lance Sell, NP  amLODipine (NORVASC) 5 MG tablet Take 1.5 tablets (7.5 mg total) by mouth daily. 07/31/16   Golden Circle, FNP  aspirin EC 325 MG tablet Take 1 tablet (325 mg total) by mouth every other day. 08/17/14   Lelon Perla, MD  Bioflavonoid Products (ESTER C PO) Take 1,000 mg by mouth daily.    [provider]  Cholecalciferol (VITAMIN D-3) 5000 UNITS TABS Take 1 capsule by mouth.    [provider]  furosemide  (LASIX) 20 MG tablet Take 2 tablets (40 mg total) by mouth daily. 12/05/16   Lelon Perla, MD  metoprolol succinate (TOPROL-XL) 25 MG 24 hr tablet TAKE 1/2 TABLET BY MOUTH DAILY FOR BLOOD PRESSURE 01/31/17   Lance Sell, NP  Multiple Vitamin (MULTIVITAMIN WITH MINERALS) TABS Take 1 tablet by mouth daily. 10/30/11   Velvet Bathe, MD  ondansetron (ZOFRAN-ODT) 4 MG disintegrating tablet Take 1 tablet (4 mg total) by mouth every 8 (eight) hours as needed for nausea or vomiting. 11/27/16   Golden Circle, FNP  POTASSIUM GLUCONATE ER PO Take by mouth daily. OTC    [provider]  ranitidine (ZANTAC) 150 MG capsule Take 1 capsule (150 mg total) by mouth daily. 12/15/16   Fatima Blank, MD  SELENIUM PO Take 1 tablet by mouth daily.    [provider]  Specialty Vitamins Products (MAGNESIUM, AMINO ACID CHELATE,) 133 MG tablet Take 1 tablet by mouth 2 (two) times daily.    [provider]  traMADol (  ULTRAM) 50 MG tablet Take 1 tablet (50 mg total) 2 (two) times daily as needed by mouth. 02/14/17   Lance Sell, NP  vitamin B-12 (CYANOCOBALAMIN) 100 MCG tablet Take 50 mcg by mouth daily.      [provider]    Physical Exam: Vitals:   03/21/2017 0314 04/02/2017 0315 03/24/2017 0345 03/21/2017 0400  BP:  (!) 128/55 (!) 144/58 (!) 143/56  Pulse: 65 63 65 65  Resp: (!) 21 20 (!) 25 16  Temp:      TempSrc:      SpO2: 95% 95% 95% 96%   General: Not in acute distress HEENT:       Eyes: PERRL, EOMI, no scleral icterus.       ENT: No discharge from the ears and nose, no pharynx injection, no tonsillar enlargement.        Neck: No JVD, no bruit, no mass felt. Heme: No neck lymph node enlargement. Cardiac: C6/C3, RRR, 2/6 systolic murmurs, No gallops or rubs. Respiratory: No rales, wheezing, rhonchi or rubs. GI: Soft, nondistended, nontender, no rebound pain, no organomegaly, BS present. GU: No hematuria Ext: No pitting leg edema bilaterally.  2+DP/PT pulse bilaterally. Musculoskeletal: No joint deformities, No joint redness or warmth, no limitation of ROM in spin. Skin: No rashes.  Neuro: Alert, oriented X3, cranial nerves II-XII grossly intact, moves all extremities normally.  Psych: Patient is not psychotic, no suicidal or hemocidal ideation.  Labs on Admission: I have personally reviewed following labs and imaging studies  CBC: Recent Labs  Lab 03/23/2017 0308  WBC 9.2  HGB 14.2  HCT 40.5  MCV 88.6  PLT 762   Basic Metabolic Panel: Recent Labs  Lab 03/19/2017 0308  NA 130*  K 4.2  CL 91*  CO2 25  GLUCOSE 114*  BUN 18  CREATININE 1.39*  CALCIUM 8.4*   GFR: CrCl cannot be calculated (Unknown ideal weight.). Liver Function Tests: Recent Labs  Lab 03/14/2017 0308  AST 43*  ALT 13*  ALKPHOS 91  BILITOT 0.9  PROT 7.4  ALBUMIN 3.4*   No results for input(s): LIPASE, AMYLASE in the last 168 hours. No results for input(s): AMMONIA in the last 168 hours. Coagulation Profile: No results for input(s): INR, PROTIME in the last 168 hours. Cardiac Enzymes: No results for input(s): CKTOTAL, CKMB, CKMBINDEX, TROPONINI in the last 168 hours. BNP (last 3 results) No results for input(s): PROBNP in the last 8760 hours. HbA1C: No results for input(s): HGBA1C in the last 72 hours. CBG: No results for input(s): GLUCAP in the last 168 hours. Lipid Profile: No results for input(s): CHOL, HDL, LDLCALC, TRIG, CHOLHDL, LDLDIRECT in the last 72 hours. Thyroid Function Tests: No results for input(s): TSH, T4TOTAL, FREET4, T3FREE, THYROIDAB in the last 72 hours. Anemia Panel: No results for input(s): VITAMINB12, FOLATE, FERRITIN, TIBC, IRON, RETICCTPCT in the last 72 hours. Urine analysis:    Component Value Date/Time   COLORURINE STRAW (A) 12/15/2016 0429   APPEARANCEUR CLEAR 12/15/2016 0429   LABSPEC 1.004 (L) 12/15/2016 0429   PHURINE 7.0 12/15/2016 0429   GLUCOSEU NEGATIVE 12/15/2016 0429   GLUCOSEU NEG mg/dL  07/11/2006 2129   HGBUR NEGATIVE 12/15/2016 0429   BILIRUBINUR NEGATIVE 12/15/2016 0429   KETONESUR NEGATIVE 12/15/2016 0429   PROTEINUR NEGATIVE 12/15/2016 0429   UROBILINOGEN 0.2 01/17/2013 1351   NITRITE NEGATIVE 12/15/2016 0429   LEUKOCYTESUR NEGATIVE 12/15/2016 0429   Sepsis Labs: _0 (procalcitonin:4,lacticidven:4) )No results found for this or any previous visit (from the  past 240 hour(s)).   Radiological Exams on Admission: Dg Chest 2 View  Result Date: 03/05/2017 CLINICAL DATA:  Chest pain and dyspnea tonight EXAM: CHEST  2 VIEW COMPARISON:  12/15/2016 FINDINGS: Unchanged severe kyphoscoliosis. The lungs are clear. No pleural effusions. Normal pulmonary vasculature. Prior trans catheter aortic valvuloplasty. IMPRESSION: No acute cardiopulmonary findings.  Severe kyphoscoliosis. Electronically Signed   By: Andreas Newport M.D.   On: 03/24/2017 03:59     EKG: Independently reviewed.  Sinus rhythm, QTC 498, nonspecific T-wave change.  Assessment/Plan Principal Problem:   Chest pain Active Problems:   Alcohol abuse   Essential hypertension, benign   COPD with chronic bronchitis (HCC)   Abdominal aortic aneurysm (HCC)   S/P TAVR (transcatheter aortic valve replacement)   GERD (gastroesophageal reflux disease)   Chronic diastolic CHF (congestive heart failure) (HCC)   CKD (chronic kidney disease), stage II   Anxiety   Hyponatremia   Chest pain: Etiology is not clear, no pneumonia on chest x-ray. Patient reports chest pain and worsening SOB. He cannot tell if it is pleuitic. We admitted to rule out PE.   -will place on Tele bed for obs - cycle CE q6 x3 and repeat EKG in the am  - prn Morphine, and aspirin, and metoprolol - avoid NTG due to hypotensive effect - Risk factor stratification: will check FLP and A1C  - 2d echo - Stat D-dimer, if positive, will get CTA to r/o PE - Inpatient non-urgent consult order was put in Epic and message to Birdie Sons was sent  out.  COPD with chronic bronchitis: -prn albuterol nebs and mucinex   HTN:  -Continue home medications: Amlodipine, metoprolol -IV hydralazine prn  Chronic diastolic CHF : 2-D echo and 60/60/17 showed EF 60-65% with grade 1 diastolic dysfunction. Patient does not have leg edema or JVD. CHF is compensated. -Hold Lasix due to worsening renal function -Continue metoprolol  GERD: -Pepcid  AoCKD-II: Baseline Cre is 1.0-1.2, pt's Cre is 1.39 on admission. Likely due to prerenal secondary to dehydration and continuation of diuretics. - IVF: pt received 1L NS, will continue NS at 75 cc/h - Follow up renal function by BMP - Hold lasix  Alcohol abuse: -Did counseling about the importance of quitting drinking -CIWA protocol  Hyponatremia: Na 130. Mental status is normal. Likely due to alcohol abuse and Lasix use. -Hold her Lasix as above -IV normal saline as above  Anxiety: -prn xanax  Abdominal aortic aneurysm ([place]): CTA on 11/21/16 showed increased size of infrarenal abdominal aortic aneurysm, now measuring 5.2 x 4.6 cm, previously 5.1 x 4.0 cm. Recommend follow up by abdomen and pelvis CTA in 3-6 months, and vascular surgery referral/consultation if not already obtained -May need to give referral to vascular surgeon  S/P TAVR (transcatheter aortic valve replacement): -stable. No acute issues    DVT ppx:    SQ Lovenox Code Status: Full code Family Communication: None at bed side.    Disposition Plan:  Anticipate discharge back to previous home environment Consults called:  none Admission status: Obs / tele    Date of Service 03/24/2017    Ivor Costa [facility] Pager [phone]  If 7PM-7AM, please contact night-coverage [email or web address] Password [facility] 03/03/2017, 5:45 AM

## 2017-03-14 ENCOUNTER — Encounter (HOSPITAL_COMMUNITY): Payer: Self-pay | Admitting: *Deleted

## 2017-03-14 ENCOUNTER — Observation Stay (HOSPITAL_COMMUNITY): Payer: Medicare Other

## 2017-03-14 DIAGNOSIS — R079 Chest pain, unspecified: Secondary | ICD-10-CM | POA: Diagnosis present

## 2017-03-14 DIAGNOSIS — K55049 Acute infarction of large intestine, extent unspecified: Secondary | ICD-10-CM | POA: Diagnosis not present

## 2017-03-14 DIAGNOSIS — Z823 Family history of stroke: Secondary | ICD-10-CM | POA: Diagnosis not present

## 2017-03-14 DIAGNOSIS — K559 Vascular disorder of intestine, unspecified: Secondary | ICD-10-CM | POA: Diagnosis not present

## 2017-03-14 DIAGNOSIS — R0600 Dyspnea, unspecified: Secondary | ICD-10-CM | POA: Diagnosis not present

## 2017-03-14 DIAGNOSIS — J690 Pneumonitis due to inhalation of food and vomit: Secondary | ICD-10-CM | POA: Diagnosis not present

## 2017-03-14 DIAGNOSIS — Z515 Encounter for palliative care: Secondary | ICD-10-CM | POA: Diagnosis not present

## 2017-03-14 DIAGNOSIS — A419 Sepsis, unspecified organism: Secondary | ICD-10-CM | POA: Diagnosis not present

## 2017-03-14 DIAGNOSIS — Z9889 Other specified postprocedural states: Secondary | ICD-10-CM | POA: Diagnosis not present

## 2017-03-14 DIAGNOSIS — Z7982 Long term (current) use of aspirin: Secondary | ICD-10-CM | POA: Diagnosis not present

## 2017-03-14 DIAGNOSIS — E871 Hypo-osmolality and hyponatremia: Secondary | ICD-10-CM | POA: Diagnosis present

## 2017-03-14 DIAGNOSIS — K668 Other specified disorders of peritoneum: Secondary | ICD-10-CM | POA: Diagnosis not present

## 2017-03-14 DIAGNOSIS — I208 Other forms of angina pectoris: Secondary | ICD-10-CM | POA: Diagnosis not present

## 2017-03-14 DIAGNOSIS — Z87891 Personal history of nicotine dependence: Secondary | ICD-10-CM | POA: Diagnosis not present

## 2017-03-14 DIAGNOSIS — I130 Hypertensive heart and chronic kidney disease with heart failure and stage 1 through stage 4 chronic kidney disease, or unspecified chronic kidney disease: Secondary | ICD-10-CM | POA: Diagnosis present

## 2017-03-14 DIAGNOSIS — K55029 Acute infarction of small intestine, extent unspecified: Secondary | ICD-10-CM | POA: Diagnosis not present

## 2017-03-14 DIAGNOSIS — Z8249 Family history of ischemic heart disease and other diseases of the circulatory system: Secondary | ICD-10-CM | POA: Diagnosis not present

## 2017-03-14 DIAGNOSIS — F1010 Alcohol abuse, uncomplicated: Secondary | ICD-10-CM | POA: Diagnosis not present

## 2017-03-14 DIAGNOSIS — I5032 Chronic diastolic (congestive) heart failure: Secondary | ICD-10-CM | POA: Diagnosis present

## 2017-03-14 DIAGNOSIS — Z833 Family history of diabetes mellitus: Secondary | ICD-10-CM | POA: Diagnosis not present

## 2017-03-14 DIAGNOSIS — Z8349 Family history of other endocrine, nutritional and metabolic diseases: Secondary | ICD-10-CM | POA: Diagnosis not present

## 2017-03-14 DIAGNOSIS — J449 Chronic obstructive pulmonary disease, unspecified: Secondary | ICD-10-CM | POA: Diagnosis present

## 2017-03-14 DIAGNOSIS — I714 Abdominal aortic aneurysm, without rupture: Secondary | ICD-10-CM | POA: Diagnosis present

## 2017-03-14 DIAGNOSIS — R0609 Other forms of dyspnea: Secondary | ICD-10-CM | POA: Diagnosis not present

## 2017-03-14 DIAGNOSIS — R6521 Severe sepsis with septic shock: Secondary | ICD-10-CM | POA: Diagnosis not present

## 2017-03-14 DIAGNOSIS — F329 Major depressive disorder, single episode, unspecified: Secondary | ICD-10-CM | POA: Diagnosis present

## 2017-03-14 DIAGNOSIS — F419 Anxiety disorder, unspecified: Secondary | ICD-10-CM | POA: Diagnosis present

## 2017-03-14 DIAGNOSIS — T8110XA Postprocedural shock unspecified, initial encounter: Secondary | ICD-10-CM | POA: Diagnosis not present

## 2017-03-14 DIAGNOSIS — N182 Chronic kidney disease, stage 2 (mild): Secondary | ICD-10-CM | POA: Diagnosis not present

## 2017-03-14 DIAGNOSIS — J95821 Acute postprocedural respiratory failure: Secondary | ICD-10-CM | POA: Diagnosis not present

## 2017-03-14 DIAGNOSIS — N179 Acute kidney failure, unspecified: Secondary | ICD-10-CM | POA: Diagnosis present

## 2017-03-14 DIAGNOSIS — F1020 Alcohol dependence, uncomplicated: Secondary | ICD-10-CM | POA: Diagnosis present

## 2017-03-14 DIAGNOSIS — R52 Pain, unspecified: Secondary | ICD-10-CM | POA: Diagnosis not present

## 2017-03-14 DIAGNOSIS — J9601 Acute respiratory failure with hypoxia: Secondary | ICD-10-CM | POA: Diagnosis not present

## 2017-03-14 DIAGNOSIS — K210 Gastro-esophageal reflux disease with esophagitis: Secondary | ICD-10-CM | POA: Diagnosis present

## 2017-03-14 DIAGNOSIS — G9341 Metabolic encephalopathy: Secondary | ICD-10-CM | POA: Diagnosis not present

## 2017-03-14 DIAGNOSIS — Z952 Presence of prosthetic heart valve: Secondary | ICD-10-CM | POA: Diagnosis not present

## 2017-03-14 LAB — BASIC METABOLIC PANEL
Anion gap: 8 (ref 5–15)
BUN: 18 mg/dL (ref 6–20)
CHLORIDE: 94 mmol/L — AB (ref 101–111)
CO2: 32 mmol/L (ref 22–32)
CREATININE: 1.22 mg/dL (ref 0.61–1.24)
Calcium: 8.2 mg/dL — ABNORMAL LOW (ref 8.9–10.3)
GFR calc Af Amer: 59 mL/min — ABNORMAL LOW (ref >60)
GFR calc non Af Amer: 51 mL/min — ABNORMAL LOW (ref >60)
Glucose, Bld: 104 mg/dL — ABNORMAL HIGH (ref 65–99)
Potassium: 4.1 mmol/L (ref 3.5–5.1)
Sodium: 134 mmol/L — ABNORMAL LOW (ref 135–145)

## 2017-03-14 LAB — NM MYOCAR MULTI W/SPECT W/WALL MOTION / EF
CHL RATE OF PERCEIVED EXERTION: 0
CSEPED: 0 min
CSEPEDS: 0 s
CSEPEW: 1 METS
MPHR: 132 {beats}/min
Peak HR: 77 {beats}/min
Percent HR: 58 %
Rest HR: 57 {beats}/min

## 2017-03-14 LAB — LIPID PANEL
CHOL/HDL RATIO: 2.6 ratio
CHOLESTEROL: 110 mg/dL (ref 0–200)
HDL: 42 mg/dL (ref >40)
LDL Cholesterol: 55 mg/dL (ref 0–99)
TRIGLYCERIDES: 66 mg/dL (ref <150)
VLDL: 13 mg/dL (ref 0–40)

## 2017-03-14 LAB — MAGNESIUM: Magnesium: 2 mg/dL (ref 1.7–2.4)

## 2017-03-14 MED ORDER — TECHNETIUM TC 99M TETROFOSMIN IV KIT
30.0000 | PACK | Freq: Once | INTRAVENOUS | Status: AC | PRN
  Administered 2017-03-14: 30 via INTRAVENOUS

## 2017-03-14 MED ORDER — REGADENOSON 0.4 MG/5ML IV SOLN
INTRAVENOUS | Status: AC
  Administered 2017-03-14: 13:00:00
  Filled 2017-03-14: qty 5

## 2017-03-14 MED ORDER — REGADENOSON 0.4 MG/5ML IV SOLN
0.4000 mg | Freq: Once | INTRAVENOUS | Status: AC
  Administered 2017-03-14: 0.4 mg via INTRAVENOUS
  Filled 2017-03-14: qty 5

## 2017-03-14 MED ORDER — TECHNETIUM TC 99M TETROFOSMIN IV KIT
10.0000 | PACK | Freq: Once | INTRAVENOUS | Status: AC | PRN
  Administered 2017-03-14: 10 via INTRAVENOUS

## 2017-03-14 NOTE — Progress Notes (Signed)
PT Cancellation Note  Patient Details Name: Samuel DragonJoseph A Hornsby MRN: [id number] DOB: January 02, 1927   Cancelled Treatment:    Reason Eval/Treat Not Completed: Patient at procedure or test/unavailable(at Nuclear med at this time)   Fabio AsaDevon J [place] 03/14/2017, 11:27 AM

## 2017-03-14 NOTE — Progress Notes (Signed)
lexiscan portion of nuc study completed without complications.  Results to follow.

## 2017-03-14 NOTE — Progress Notes (Signed)
Progress Note  Patient Name: Samuel [name] Date of Encounter: 03/14/2017  Primary Cardiologist: Olga Millers, MD   Subjective   No chest pain complaining about not being able to eat breakfast  Inpatient Medications    Scheduled Meds: . amLODipine  7.5 mg Oral Daily  . aspirin EC  325 mg Oral Daily  . cholecalciferol  5,000 Units Oral Daily  . enoxaparin (LOVENOX) injection  40 mg Subcutaneous Q24H  . famotidine  20 mg Oral Daily  . folic acid  1 mg Oral Daily  . LORazepam  0-4 mg Intravenous Q6H   Followed by  . [START ON 03/13/2017] LORazepam  0-4 mg Intravenous Q12H  . magnesium oxide  200 mg Oral BID  . metoprolol succinate  25 mg Oral Daily  . multivitamin with minerals  1 tablet Oral Daily  . selenium  100 mcg Oral Daily  . thiamine  100 mg Oral Daily   Or  . thiamine  100 mg Intravenous Daily  . vitamin B-12  50 mcg Oral Daily   Continuous Infusions: . sodium chloride Stopped (03/21/2017 1207)   PRN Meds: acetaminophen, albuterol, ALPRAZolam, dextromethorphan-guaiFENesin, hydrALAZINE, LORazepam **OR** LORazepam, morphine injection, ondansetron (ZOFRAN) IV, traMADol, zolpidem   Vital Signs    Vitals:   2017-03-21 1624 21-Mar-2017 2012 03/14/17 0100 03/14/17 0315  BP: (!) 143/52 (!) 167/59 (!) 132/44 (!) 131/42  Pulse: 63 (!) 58 (!) 56 60  Resp:  19 17 18   Temp: 97.6 F (36.4 C) (!) 97.5 F (36.4 C) (!) 97.5 F (36.4 C) 97.6 F (36.4 C)  TempSrc: Oral Oral Oral Oral  SpO2: 98% 99% 95% 96%  Weight:    143 lb 3.2 oz (65 kg)  Height:        Intake/Output Summary (Last 24 hours) at 03/14/2017 0817 Last data filed at Mar 21, 2017 2127 Gross per 24 hour  Intake 953.75 ml  Output 901 ml  Net 52.75 ml   Filed Weights   03-21-2017 0650 03/14/17 0315  Weight: 140 lb 8 oz (63.7 kg) 143 lb 3.2 oz (65 kg)    Telemetry    NSR 03/14/2017  - Personally Reviewed  ECG    NSR normal ECG - Personally Reviewed  Physical Exam  BP (!) 131/42 (BP Location: Left  Arm)   Pulse 60   Temp 97.6 F (36.4 C) (Oral)   Resp 18   Ht 5\' 8"  (1.727 m)   Wt 143 lb 3.2 oz (65 kg)   SpO2 96%   BMI 21.77 kg/m  Affect appropriate Disheveled white male  HEENT: normal Neck supple with no adenopathy JVP normal no bruits no thyromegaly Lungs clear with no wheezing and good diaphragmatic motion Heart:  S1/S2 SEM through TAVR valve murmur, no rub, gallop or click PMI normal Abdomen: benighn, BS positve, no tenderness AAA palpable not tender  no bruit.  No HSM or HJR Distal pulses intact with no bruits No edema Neuro non-focal Skin warm and dry No muscular weakness  Labs    Chemistry Recent Labs  Lab 2017-03-21 0308 03/14/17 0547  NA 130* 134*  K 4.2 4.1  CL 91* 94*  CO2 25 32  GLUCOSE 114* 104*  BUN 18 18  CREATININE 1.39* 1.22  CALCIUM 8.4* 8.2*  PROT 7.4  --   ALBUMIN 3.4*  --   AST 43*  --   ALT 13*  --   ALKPHOS 91  --   BILITOT 0.9  --   GFRNONAA 44* 51*  GFRAA 51* 59*  ANIONGAP 14 8     Hematology Recent Labs  Lab 2017/03/25 0308  WBC 9.2  RBC 4.57  HGB 14.2  HCT 40.5  MCV 88.6  MCH 31.1  MCHC 35.1  RDW 12.2  PLT 170    Cardiac Enzymes Recent Labs  Lab 03-25-17 0512 03-25-17 1024 25-Mar-2017 1610  TROPONINI <0.03 <0.03 <0.03    Recent Labs  Lab March 25, 2017 0314  TROPIPOC 0.01     BNP Recent Labs  Lab 2017-03-25 0512  BNP 282.1*     DDimer  Recent Labs  Lab 03-25-2017 0308  DDIMER 2.54*     Radiology    Ct Abdomen Pelvis Wo Contrast  Result Date: 03/14/2017 CLINICAL DATA:  Chest and abdominal pain. A AS suspected. Free air seen on chest CT. EXAM: CT ABDOMEN AND PELVIS WITHOUT CONTRAST TECHNIQUE: Multidetector CT imaging of the abdomen and pelvis was performed following the standard protocol without IV contrast. COMPARISON:  11/21/2016.  09/20/2015.  Chest CT earlier same day. FINDINGS: Lower chest: Lung bases again show chronic scarring/volume loss in the right lower lobe. Aortic valve replacement.  Hepatobiliary: Small amount of free air again evident around the margins of the liver. No hepatic parenchymal lesion. No calcified gallstones. Pancreas: Negative Spleen: Normal Adrenals/Urinary Tract: Adrenal glands are normal. Right renal cysts, the largest measuring 4 cm at the upper pole. No sign of mass or obstruction. Stomach/Bowel: No evidence of advanced bowel pathology to explain the small amount of free air. The patient has have advanced diverticulosis affecting the left colon, without imaging evidence of definable diverticulitis. Perhaps there was perforation of a diverticulum without secondary surrounding inflammatory changes. The patient does have some swirling of the root of the mesentery but there is not any sign of small bowel ischemia or dilatation. Therefore I doubt of the significance that finding. Vascular/Lymphatic: Advanced aortic atherosclerosis as seen previously with an irregular infrarenal aneurysm projecting primarily towards the right. Today's study was not done as a contrast-enhanced exam. Measurement in the same location is not significantly changed, measuring 5.3 x 4.5 cm as opposed to 5.2 x 4.6 cm on the previous exam. No evidence of retroperitoneal bleeding. IVC is normal. No retroperitoneal adenopathy. Reproductive: Normal Other: Small amount of free air as described above, most likely secondary to perforated diverticulum without secondary findings of diverticulitis. Musculoskeletal: No acute finding. Chronic spinal curvature in degenerative changes. IMPRESSION: Tiny amount of free intraperitoneal air as shown at chest CT. No evidence of active bowel pathology based on the abdominal CT findings. Extensive diverticulosis of the left colon. Perhaps the free air is secondary to a perforated diverticulum without frank diverticulitis. Follow-up clinically with attention to the possibility of developing peritonitis. There is a swirling pattern at the root of the small bowel mesentery but  there is no sign of small bowel ischemia or dilatation. The significance of the finding is doubtful. Stable infrarenal abdominal aortic aneurysm with maximal transverse diameter of 5.3 x 4.5 cm, eccentric towards the right. Recommend followup by abdomen and pelvis CTA in 3-6 months, and vascular surgery referral/consultation if not already obtained. This recommendation follows ACR consensus guidelines: White Paper of the ACR Incidental Findings Committee II on Vascular Findings. J Am Coll Radiol 2013; 10:789-794. Electronically Signed   By: Paulina Fusi M.D.   On: 03/14/2017 07:17   Dg Chest 2 View  Result Date: Mar 25, 2017 CLINICAL DATA:  Chest pain and dyspnea tonight EXAM: CHEST  2 VIEW COMPARISON:  12/15/2016 FINDINGS: Unchanged severe  kyphoscoliosis. The lungs are clear. No pleural effusions. Normal pulmonary vasculature. Prior trans catheter aortic valvuloplasty. IMPRESSION: No acute cardiopulmonary findings.  Severe kyphoscoliosis. Electronically Signed   By: Ellery Plunkaniel R Mitchell M.D.   On: 11-16-16 03:59   Ct Angio Chest Pe W Or Wo Contrast  Result Date: 11-16-16 CLINICAL DATA:  Acute onset of shortness of breath and left-sided chest pain. Elevated D-dimer. EXAM: CT ANGIOGRAPHY CHEST WITH CONTRAST TECHNIQUE: Multidetector CT imaging of the chest was performed using the standard protocol during bolus administration of intravenous contrast. Multiplanar CT image reconstructions and MIPs were obtained to evaluate the vascular anatomy. CONTRAST:  100mL ISOVUE-370 IOPAMIDOL (ISOVUE-370) INJECTION 76% COMPARISON:  Chest radiograph performed earlier today at 3:52 a.m., and CTA of the chest performed 03/01/2016 FINDINGS: Cardiovascular:  There is no evidence of pulmonary embolus. The heart is normal in size. An aortic valve replacement is noted. Prominence of the aortic arch to 3.9 cm in maximal diameter likely remains within normal limits given the patient's age. The great vessels appear grossly intact.  Mild mural thrombus is noted along the aortic arch, with scattered calcification along the aortic arch and descending thoracic aorta but no significant luminal narrowing. Mediastinum/Nodes: No mediastinal lymphadenopathy is seen. No pericardial effusion is identified. Diffuse wall thickening along the course of the esophagus may reflect chronic inflammation or esophagitis. The thyroid gland is unremarkable. No axillary lymphadenopathy is appreciated. Lungs/Pleura: Mild scarring or atelectasis is noted at the lung bases. No pleural effusion or pneumothorax is seen. No masses are identified. Upper Abdomen: Scattered free air is seen tracking about the liver and gallbladder, raising concern for bowel perforation. The location of perforation is not characterized on this study. Scattered calcification is seen along the proximal abdominal aorta and its branches. Musculoskeletal: No acute osseous abnormalities are identified. Severe right convex thoracic scoliosis is noted. The visualized musculature is unremarkable in appearance. Review of the MIP images confirms the above findings. IMPRESSION: 1. No evidence of pulmonary embolus. 2. Scattered free air noted at the upper abdomen, concerning for bowel perforation. The location of perforation is not characterized on this study. CT of the abdomen and pelvis is recommended for further evaluation. 3. Diffuse wall thickening along the course of the esophagus may reflect chronic inflammation or esophagitis. 4. Severe right convex thoracic scoliosis noted. Aortic Atherosclerosis (ICD10-I70.0). Critical Value/emergent results were called by telephone at the time of interpretation on 11-16-16 at 6:47 am to [id number], who verbally acknowledged these results. Electronically Signed   By: Roanna RaiderJeffery  Chang M.D.   On: 11-16-16 06:50    Cardiac Studies   Echo EF normal grade 2 diastolic TAVR valve with mean gradient 9 mmHg And mild AR   Patient Profile     Samuel [name] is a 83 y.o. male with a hx of nonobstructive CAD by [facility] 10/2011, severe aortic stenosis s/p TAVR 01/21/2013, infrarenal abdominal aortic saccular aneurysm measuring 5.1 x 4 cm by CTA 09/2015, diastolic dysfunction, CKD stage II,  HTN, EtOH abuse, COPD 2/2 prior tobacco abuse, chronic low back pain, and GERD who is being seen today for the evaluation of chest pain and SOB at the request of Dr. Clyde LundborgNiu.    Assessment & Plan    1. Chest pain with moderate risk for cardiac etiology: -Currently, symptom free -Last ischemic evaluation 10/2011 -Cycle third troponin to rule out -Schedule Lexiscan Myoivew for today  -Toprol  -Lipid and A1c pending for further risk stratification   2. Diastolic dysfunction: grade  2 on echo this admission  -He does not appear volume overloaded Cr better hold lasix home dose today as well He looks dry   3. Severe aortic stenosis s/p AVR: mean gradient stable 9 mmHg mild AR   4. Abnormal CT/possible free air noted in the abdomen: - CT diverticulosis  Not acting like acute abdomen    5. Acute on CKD stage II: -Gentle IV fluids -hold lasix today   6. Hyponatremia: improved 134 today  -Likely 2/2 alcohol abuse and Lasix -Lasix on hold given AKI  7. Alcohol abuse: -CIWA protocol -Check magnesium with recommendation to replete to goal of > 2.0  8. AAA: -CTA abdomen stable 5.3 by CT f/u VVS   9. Hypertensive heart disease: -Amlodipine and Toprol -Titrate as needed   Ok to d/c home today if myovue non ischemic   For questions or updates, please contact [facility] Please consult [email or web address] for contact info under Cardiology/STEMI.      Signed, Charlton HawsPeter Nishan, MD  03/14/2017, 8:17 AM

## 2017-03-14 NOTE — Progress Notes (Signed)
PROGRESS NOTE  Samuel [name] WUJ:[id number] DOB: September 06, 1926 DOA: 03/09/2017 PCP: Veryl Speak, FNP   LOS: 0 days   Brief Narrative / Interim history: CABLE FEARN is a 83 y.o. male with medical history significant of hypertension, COPD, GERD, depression, anxiety, aortic stenosis, s/p of TAVR, CHF, alcohol abuse, AAA, CKD2, kyphoscoliosis, who presents with chest pain or shortness of breath.  Assessment & Plan: Principal Problem:   Dyspnea Active Problems:   Alcohol abuse   Essential hypertension, benign   COPD with chronic bronchitis (HCC)   Physical deconditioning   Abdominal aortic aneurysm (HCC)   S/P TAVR (transcatheter aortic valve replacement)   Dehydration   GERD (gastroesophageal reflux disease)   Chronic diastolic CHF (congestive heart failure) (HCC)   CKD (chronic kidney disease), stage II   Anxiety   Hyponatremia   Chest pain  Chest pain -cardiology following, patient is undergoing stress test today  -CTA without PE  Free intraperitoneal air  -tiny amount -unclear clinical significance, patient without abdominal pain, tolerating diet and having BMs. -No peritoneal sings, no signs of perforated diverticulitis. D/w Dr. Magnus Ivan with general surgery, no need for any interventions unless he becomes symptomatic. He is afebrile, no WBC -monitor   COPD with chronic bronchitis -prn albuterol nebs and mucinex   HTN:  -Continue home medications: Amlodipine, metoprolol -IV hydralazine prn  Chronic diastolic CHF -2-D echo and 60/60/17 showed EF 60-65% with grade 1 diastolic dysfunction. Patient does not have leg edema or JVD. CHF is compensated. -Continue metoprolol  GERD -Pepcid  AoCKD-II: Baseline Cre is 1.0-1.2, pt's Cre is 1.39 on admission. Likely due to prerenal secondary to dehydration and continuation of diuretics. -Cr now back to baseline -Hold lasix, stop IVF  Alcohol abuse -Did counseling about the importance of quitting  drinking -CIWA protocol -determined to quit  Hyponatremia -Na 130. Mental status is normal. Likely due to alcohol abuse and Lasix use. -Hold her Lasix as above -Na 134 today, stop IVF  Anxiety: -prn xanax  Abdominal aortic aneurysm (HCC)  -CTA on 11/21/16 showed increased size of infrarenal abdominal aortic aneurysm, now measuring 5.2 x 4.6 cm, previously 5.1 x 4.0 cm. Recommend follow up by abdomen and pelvis CTA in 3-6 months, and vascular surgery referral/consultation if not already obtained -May need to give referral to vascular surgeon  S/P TAVR (transcatheter aortic valve replacement): -stable. No acute issues    DVT prophylaxis: Lovenox Code Status: Full code Family Communication: no family at bedside Disposition Plan: PT eval pending, anticipate home   Consultants:   Cardiology   General surgery (phone)  Procedures:   None   Antimicrobials:  None    Subjective: - no chest pain, shortness of breath, no abdominal pain, nausea or vomiting.   Objective: Vitals:   03/14/17 0100 03/14/17 0315 03/14/17 1130 03/14/17 1132  BP: (!) 132/44 (!) 131/42 (!) 148/56 (!) 138/57  Pulse: (!) 56 60    Resp: 17 18    Temp: (!) 97.5 F (36.4 C) 97.6 F (36.4 C)    TempSrc: Oral Oral    SpO2: 95% 96%    Weight:  65 kg (143 lb 3.2 oz)    Height:        Intake/Output Summary (Last 24 hours) at 03/14/2017 1326 Last data filed at 03/14/2017 0943 Gross per 24 hour  Intake 300 ml  Output 801 ml  Net -501 ml   Filed Weights   03/31/2017 0650 03/14/17 0315  Weight: 63.7 kg (140 lb 8  oz) 65 kg (143 lb 3.2 oz)    Examination:  Constitutional: NAD Eyes: lids and conjunctivae normal Neck: normal, supple, no masses, no thyromegaly Respiratory: clear to auscultation bilaterally, no wheezing, no crackles.  Cardiovascular: Regular rate and rhythm, no murmurs / rubs / gallops. No LE edema.  Abdomen: no tenderness. Bowel sounds positive.  Musculoskeletal: no clubbing /  cyanosis Skin: no rashes, lesions, ulcers. No induration Neurologic: non focal    Data Reviewed: I have independently reviewed following labs and imaging studies   CBC: Recent Labs  Lab 01-26-17 0308  WBC 9.2  HGB 14.2  HCT 40.5  MCV 88.6  PLT 170   Basic Metabolic Panel: Recent Labs  Lab 01-26-17 0308 01-26-17 1024 03/14/17 0547  NA 130*  --  134*  K 4.2  --  4.1  CL 91*  --  94*  CO2 25  --  32  GLUCOSE 114*  --  104*  BUN 18  --  18  CREATININE 1.39*  --  1.22  CALCIUM 8.4*  --  8.2*  MG  --  1.9 2.0   GFR: Estimated Creatinine Clearance: 38.5 mL/min (by C-G formula based on SCr of 1.22 mg/dL). Liver Function Tests: Recent Labs  Lab 01-26-17 0308  AST 43*  ALT 13*  ALKPHOS 91  BILITOT 0.9  PROT 7.4  ALBUMIN 3.4*   No results for input(s): LIPASE, AMYLASE in the last 168 hours. No results for input(s): AMMONIA in the last 168 hours. Coagulation Profile: No results for input(s): INR, PROTIME in the last 168 hours. Cardiac Enzymes: Recent Labs  Lab 01-26-17 0512 01-26-17 1024 01-26-17 1610  TROPONINI <0.03 <0.03 <0.03   BNP (last 3 results) No results for input(s): PROBNP in the last 8760 hours. HbA1C: No results for input(s): HGBA1C in the last 72 hours. CBG: No results for input(s): GLUCAP in the last 168 hours. Lipid Profile: Recent Labs    03/14/17 0547  CHOL 110  HDL 42  LDLCALC 55  TRIG 66  CHOLHDL 2.6   Thyroid Function Tests: No results for input(s): TSH, T4TOTAL, FREET4, T3FREE, THYROIDAB in the last 72 hours. Anemia Panel: No results for input(s): VITAMINB12, FOLATE, FERRITIN, TIBC, IRON, RETICCTPCT in the last 72 hours. Urine analysis:    Component Value Date/Time   COLORURINE YELLOW 2016/10/24 0706   APPEARANCEUR CLEAR 2016/10/24 0706   LABSPEC 1.021 2016/10/24 0706   PHURINE 6.0 2016/10/24 0706   GLUCOSEU NEGATIVE 2016/10/24 0706   GLUCOSEU NEG mg/dL 16/10/960404/12/2006 [id number]   HGBUR SMALL (A) 2016/10/24 0706   BILIRUBINUR  NEGATIVE 2016/10/24 0706   KETONESUR NEGATIVE 2016/10/24 0706   PROTEINUR NEGATIVE 2016/10/24 0706   UROBILINOGEN 0.2 01/17/2013 1351   NITRITE NEGATIVE 2016/10/24 0706   LEUKOCYTESUR NEGATIVE 2016/10/24 0706   Sepsis Labs: Invalid input(s): PROCALCITONIN, LACTICIDVEN  No results found for this or any previous visit (from the past 240 hour(s)).    Radiology Studies: Ct Abdomen Pelvis Wo Contrast  Result Date: 03/14/2017 CLINICAL DATA:  Chest and abdominal pain. A AS suspected. Free air seen on chest CT. EXAM: CT ABDOMEN AND PELVIS WITHOUT CONTRAST TECHNIQUE: Multidetector CT imaging of the abdomen and pelvis was performed following the standard protocol without IV contrast. COMPARISON:  11/21/2016.  09/20/2015.  Chest CT earlier same day. FINDINGS: Lower chest: Lung bases again show chronic scarring/volume loss in the right lower lobe. Aortic valve replacement. Hepatobiliary: Small amount of free air again evident around the margins of the liver. No hepatic parenchymal lesion. No  calcified gallstones. Pancreas: Negative Spleen: Normal Adrenals/Urinary Tract: Adrenal glands are normal. Right renal cysts, the largest measuring 4 cm at the upper pole. No sign of mass or obstruction. Stomach/Bowel: No evidence of advanced bowel pathology to explain the small amount of free air. The patient has have advanced diverticulosis affecting the left colon, without imaging evidence of definable diverticulitis. Perhaps there was perforation of a diverticulum without secondary surrounding inflammatory changes. The patient does have some swirling of the root of the mesentery but there is not any sign of small bowel ischemia or dilatation. Therefore I doubt of the significance that finding. Vascular/Lymphatic: Advanced aortic atherosclerosis as seen previously with an irregular infrarenal aneurysm projecting primarily towards the right. Today's study was not done as a contrast-enhanced exam. Measurement in the same  location is not significantly changed, measuring 5.3 x 4.5 cm as opposed to 5.2 x 4.6 cm on the previous exam. No evidence of retroperitoneal bleeding. IVC is normal. No retroperitoneal adenopathy. Reproductive: Normal Other: Small amount of free air as described above, most likely secondary to perforated diverticulum without secondary findings of diverticulitis. Musculoskeletal: No acute finding. Chronic spinal curvature in degenerative changes. IMPRESSION: Tiny amount of free intraperitoneal air as shown at chest CT. No evidence of active bowel pathology based on the abdominal CT findings. Extensive diverticulosis of the left colon. Perhaps the free air is secondary to a perforated diverticulum without frank diverticulitis. Follow-up clinically with attention to the possibility of developing peritonitis. There is a swirling pattern at the root of the small bowel mesentery but there is no sign of small bowel ischemia or dilatation. The significance of the finding is doubtful. Stable infrarenal abdominal aortic aneurysm with maximal transverse diameter of 5.3 x 4.5 cm, eccentric towards the right. Recommend followup by abdomen and pelvis CTA in 3-6 months, and vascular surgery referral/consultation if not already obtained. This recommendation follows ACR consensus guidelines: White Paper of the ACR Incidental Findings Committee II on Vascular Findings. J Am Coll Radiol 2013; 10:789-794. Electronically Signed   By: Paulina Fusi M.D.   On: 03/14/2017 07:17   Dg Chest 2 View  Result Date: 03/05/2017 CLINICAL DATA:  Chest pain and dyspnea tonight EXAM: CHEST  2 VIEW COMPARISON:  12/15/2016 FINDINGS: Unchanged severe kyphoscoliosis. The lungs are clear. No pleural effusions. Normal pulmonary vasculature. Prior trans catheter aortic valvuloplasty. IMPRESSION: No acute cardiopulmonary findings.  Severe kyphoscoliosis. Electronically Signed   By: Ellery Plunk M.D.   On: 03/28/2017 03:59   Ct Angio Chest Pe W Or  Wo Contrast  Result Date: 03/16/2017 CLINICAL DATA:  Acute onset of shortness of breath and left-sided chest pain. Elevated D-dimer. EXAM: CT ANGIOGRAPHY CHEST WITH CONTRAST TECHNIQUE: Multidetector CT imaging of the chest was performed using the standard protocol during bolus administration of intravenous contrast. Multiplanar CT image reconstructions and MIPs were obtained to evaluate the vascular anatomy. CONTRAST:  ISOVUE-370 IOPAMIDOL (ISOVUE-370) INJECTION 76% COMPARISON:  Chest radiograph performed earlier today at 3:52 a.m., and CTA of the chest performed 03/01/2016 FINDINGS: Cardiovascular:  There is no evidence of pulmonary embolus. The heart is normal in size. An aortic valve replacement is noted. Prominence of the aortic arch to 3.9 cm in maximal diameter likely remains within normal limits given the patient's age. The great vessels appear grossly intact. Mild mural thrombus is noted along the aortic arch, with scattered calcification along the aortic arch and descending thoracic aorta but no significant luminal narrowing. Mediastinum/Nodes: No mediastinal lymphadenopathy is seen. No pericardial effusion  is identified. Diffuse wall thickening along the course of the esophagus may reflect chronic inflammation or esophagitis. The thyroid gland is unremarkable. No axillary lymphadenopathy is appreciated. Lungs/Pleura: Mild scarring or atelectasis is noted at the lung bases. No pleural effusion or pneumothorax is seen. No masses are identified. Upper Abdomen: Scattered free air is seen tracking about the liver and gallbladder, raising concern for bowel perforation. The location of perforation is not characterized on this study. Scattered calcification is seen along the proximal abdominal aorta and its branches. Musculoskeletal: No acute osseous abnormalities are identified. Severe right convex thoracic scoliosis is noted. The visualized musculature is unremarkable in appearance. Review of the MIP  images confirms the above findings. IMPRESSION: 1. No evidence of pulmonary embolus. 2. Scattered free air noted at the upper abdomen, concerning for bowel perforation. The location of perforation is not characterized on this study. CT of the abdomen and pelvis is recommended for further evaluation. 3. Diffuse wall thickening along the course of the esophagus may reflect chronic inflammation or esophagitis. 4. Severe right convex thoracic scoliosis noted. Aortic Atherosclerosis (ICD10-I70.0). Critical Value/emergent results were called by telephone at the time of interpretation on 03/10/2017 at 6:47 am [id number], who verbally acknowledged these results. Electronically Signed   By: Roanna RaiderJeffery  Chang M.D.   On: 03/04/2017 06:50     Scheduled Meds: . amLODipine  7.5 mg Oral Daily  . aspirin EC  325 mg Oral Daily  . cholecalciferol  5,000 Units Oral Daily  . enoxaparin (LOVENOX) injection  40 mg Subcutaneous Q24H  . famotidine  20 mg Oral Daily  . folic acid  1 mg Oral Daily  . LORazepam  0-4 mg Intravenous Q6H   Followed by  . [START ON Dec 01, 2016] LORazepam  0-4 mg Intravenous Q12H  . magnesium oxide  200 mg Oral BID  . metoprolol succinate  25 mg Oral Daily  . multivitamin with minerals  1 tablet Oral Daily  . regadenoson      . selenium  100 mcg Oral Daily  . thiamine  100 mg Oral Daily   Or  . thiamine  100 mg Intravenous Daily  . vitamin B-12  50 mcg Oral Daily   Continuous Infusions: . sodium chloride Stopped (03/03/2017 [id number])     Pamella Pertostin Gherghe, MD, PhD [facility] Pager [phone]  If 7PM-7AM, please contact night-coverage [email or web address] Password [email or web address] 03/14/2017, 1:26 PM

## 2017-03-14 NOTE — Evaluation (Signed)
Physical Therapy Evaluation Patient Details Name: Samuel [name] MRN: [id number] DOB: Aug 13, 1926 Today's Date: 03/14/2017   History of Present Illness  83 y.o. male with medical history significant of hypertension, COPD, GERD, depression, anxiety, aortic stenosis, s/p of TAVR, CHF, alcohol abuse, AAA, CKD2, kyphoscoliosis, who presents with chest pain or shortness of breath.  Clinical Impression  Orders received for PT evaluation. Patient demonstrates deficits in functional mobility as indicated below. Will benefit from continued skilled PT to address deficits and maximize function. Will see as indicated and progress as tolerated.  PATIENT SIGNIFICANTLY UNSTEADY, HIGH FALL RISK and poor safety awareness. DO NOT feel patient is safe to discharge home a this time from a mobility stand point. Patient significantly ataxic with ambulation, nearly fell 4 times during session and shows limited insight and awareness of deficits.     Follow Up Recommendations SNF;Supervision/Assistance - 24 hour    Equipment Recommendations  None recommended by PT    Recommendations for Other Services       Precautions / Restrictions Precautions Precautions: Fall      Mobility  Bed Mobility Overal bed mobility: Needs Assistance Bed Mobility: Supine to Sit     Supine to sit: Min assist     General bed mobility comments: patient required assist to pull trunk to upright, unable to perform without  Transfers Overall transfer level: Needs assistance Equipment used: Rolling walker (2 wheeled) Transfers: Sit to/from Stand Sit to Stand: Mod assist         General transfer comment: patient very unsafe during transition, Max cues for impulsivity and safety. Patient required increased assist to elevate from bed and from toilet with grab bars. Patient with poor environmental awareness reach out for rolling table despite being cued for use of Rw  Ambulation/Gait Ambulation/Gait assistance: Mod  assist Ambulation Distance (Feet): 18 Feet Assistive device: Rolling walker (2 wheeled) Gait Pattern/deviations: Staggering left;Staggering right;Decreased stride length;Step-through pattern;Trunk flexed Gait velocity: decreased Gait velocity interpretation: Below normal speed for age/gender General Gait Details: patient required moderate physical assist to prevent fall. patient with 4 near falls due to safety and instability. patient with poor insight and use of RW despite max cues  Stairs            Wheelchair Mobility    Modified Rankin (Stroke Patients Only)       Balance Overall balance assessment: Needs assistance   Sitting balance-Leahy Scale: Fair       Standing balance-Leahy Scale: Poor Standing balance comment: patient unable to maintain upright without assist and UE support                             Pertinent Vitals/Pain      Home Living Family/patient expects to be discharged to:: Private residence Living Arrangements: Alone(Public housing)   Type of Home: Apartment Home Access: Elevator     Home Layout: One level Home Equipment: Environmental consultantWalker - 2 wheels;Walker - 4 wheels Additional Comments: lives at [address] [place], states that he just holds on to things in the apartment to get around    Prior Function Level of Independence: Needs assistance   Gait / Transfers Assistance Needed: states that he uses furniture to get around           Hand Dominance   Dominant Hand: Right    Extremity/Trunk Assessment   Upper Extremity Assessment Upper Extremity Assessment: Generalized weakness    Lower Extremity Assessment Lower Extremity Assessment: Generalized  weakness       Communication   Communication: No difficulties  Cognition Arousal/Alertness: Awake/alert Behavior During Therapy: Impulsive Overall Cognitive Status: Impaired/Different from baseline Area of Impairment: Safety/judgement;Awareness;Problem solving;Attention;Following  commands                   Current Attention Level: Sustained   Following Commands: Follows one step commands with increased time Safety/Judgement: Decreased awareness of safety;Decreased awareness of deficits Awareness: Emergent Problem Solving: Slow processing;Requires verbal cues;Requires tactile cues        General Comments      Exercises     Assessment/Plan    PT Assessment Patient needs continued PT services  PT Problem List Decreased strength;Decreased activity tolerance;Decreased balance;Decreased mobility;Decreased coordination;Decreased cognition;Decreased safety awareness       PT Treatment Interventions DME instruction;Gait training;Functional mobility training;Therapeutic activities;Therapeutic exercise;Balance training;Cognitive remediation;Patient/family education    PT Goals (Current goals can be found in the Care Plan section)  Acute Rehab PT Goals Patient Stated Goal: to go home PT Goal Formulation: With patient Time For Goal Achievement: 03/28/17 Potential to Achieve Goals: Fair    Frequency Min 3X/week   Barriers to discharge Inaccessible home environment;Decreased caregiver support      Co-evaluation               AM-PAC PT "6 Clicks" Daily Activity  Outcome Measure Difficulty turning over in bed (including adjusting bedclothes, sheets and blankets)?: Unable Difficulty moving from lying on back to sitting on the side of the bed? : Unable Difficulty sitting down on and standing up from a chair with arms (e.g., wheelchair, bedside commode, etc,.)?: Unable Help needed moving to and from a bed to chair (including a wheelchair)?: A Lot Help needed walking in hospital room?: A Lot Help needed climbing 3-5 steps with a railing? : Total 6 Click Score: 8    End of Session Equipment Utilized During Treatment: Gait belt Activity Tolerance: Patient limited by fatigue Patient left: in bed;with call bell/phone within reach;with bed alarm  set Nurse Communication: Mobility status PT Visit Diagnosis: Difficulty in walking, not elsewhere classified (R26.2);Unsteadiness on feet (R26.81)    Time: [id number] PT Time Calculation (min) (ACUTE ONLY): 20 min   Charges:   PT Evaluation $PT Eval Moderate Complexity: 1 Mod     PT G Codes:   PT G-Codes **NOT FOR INPATIENT CLASS** Functional Assessment Tool Used: Clinical judgement Functional Limitation: Mobility: Walking and moving around Mobility: Walking and Moving Around Current Status ([id number]([id number]): At least 40 percent but less than 60 percent impaired, limited or restricted Mobility: Walking and Moving Around Goal Status [phone]([id number]): At least 1 percent but less than 20 percent impaired, limited or restricted    Charlotte Crumbevon Breylin Dom, PT DPT  Board Certified Neurologic Specialist [phone]   Fabio AsaDevon J Anju Sereno 03/14/2017, 3:06 PM

## 2017-03-14 NOTE — Plan of Care (Signed)
  Progressing Pain Managment: General experience of comfort will improve 03/14/2017 0418 - Progressing by Horris Latinouvall, Saniyah Mondesir G, RN Note Denies c/o pain; no s/s of discomfort noted.

## 2017-03-14 NOTE — Progress Notes (Signed)
Abnormal nuc study, will discuss with Dr. Tresa EndoKelly on call cards.

## 2017-03-15 ENCOUNTER — Ambulatory Visit: Payer: Medicare Other | Admitting: Nurse Practitioner

## 2017-03-15 ENCOUNTER — Encounter (HOSPITAL_COMMUNITY): Payer: Self-pay | Admitting: Cardiovascular Disease

## 2017-03-15 ENCOUNTER — Inpatient Hospital Stay (HOSPITAL_COMMUNITY): Payer: Medicare Other

## 2017-03-15 ENCOUNTER — Encounter (HOSPITAL_COMMUNITY): Admission: EM | Disposition: E | Payer: Self-pay | Source: Home / Self Care | Attending: Internal Medicine

## 2017-03-15 DIAGNOSIS — I714 Abdominal aortic aneurysm, without rupture: Secondary | ICD-10-CM

## 2017-03-15 DIAGNOSIS — F419 Anxiety disorder, unspecified: Secondary | ICD-10-CM

## 2017-03-15 DIAGNOSIS — N182 Chronic kidney disease, stage 2 (mild): Secondary | ICD-10-CM

## 2017-03-15 DIAGNOSIS — R52 Pain, unspecified: Secondary | ICD-10-CM

## 2017-03-15 DIAGNOSIS — K668 Other specified disorders of peritoneum: Secondary | ICD-10-CM

## 2017-03-15 DIAGNOSIS — I5032 Chronic diastolic (congestive) heart failure: Secondary | ICD-10-CM

## 2017-03-15 DIAGNOSIS — F1010 Alcohol abuse, uncomplicated: Secondary | ICD-10-CM

## 2017-03-15 DIAGNOSIS — I208 Other forms of angina pectoris: Secondary | ICD-10-CM

## 2017-03-15 HISTORY — PX: LEFT HEART CATH AND CORONARY ANGIOGRAPHY: CATH118249

## 2017-03-15 LAB — CBC
HCT: 43.9 % (ref 39.0–52.0)
Hemoglobin: 15.3 g/dL (ref 13.0–17.0)
MCH: 32 pg (ref 26.0–34.0)
MCHC: 34.9 g/dL (ref 30.0–36.0)
MCV: 91.8 fL (ref 78.0–100.0)
PLATELETS: 132 10*3/uL — AB (ref 150–400)
RBC: 4.78 MIL/uL (ref 4.22–5.81)
RDW: 12.3 % (ref 11.5–15.5)
WBC: 10.4 10*3/uL (ref 4.0–10.5)

## 2017-03-15 LAB — HEMOGLOBIN A1C
HEMOGLOBIN A1C: 5.4 % (ref 4.8–5.6)
MEAN PLASMA GLUCOSE: 108 mg/dL

## 2017-03-15 LAB — PROTIME-INR
INR: 1.09
PROTHROMBIN TIME: 14 s (ref 11.4–15.2)

## 2017-03-15 SURGERY — LEFT HEART CATH AND CORONARY ANGIOGRAPHY
Anesthesia: LOCAL

## 2017-03-15 MED ORDER — MUSCLE RUB 10-15 % EX CREA
TOPICAL_CREAM | CUTANEOUS | Status: DC | PRN
  Administered 2017-03-15: 23:00:00 via TOPICAL
  Filled 2017-03-15: qty 85

## 2017-03-15 MED ORDER — HEPARIN (PORCINE) IN NACL 2-0.9 UNIT/ML-% IJ SOLN
INTRAMUSCULAR | Status: AC
  Filled 2017-03-15: qty 1000

## 2017-03-15 MED ORDER — MORPHINE SULFATE (PF) 2 MG/ML IV SOLN
2.0000 mg | INTRAVENOUS | Status: DC | PRN
  Administered 2017-03-15 – 2017-03-16 (×3): 2 mg via INTRAVENOUS
  Filled 2017-03-15 (×3): qty 1

## 2017-03-15 MED ORDER — SODIUM CHLORIDE 0.9% FLUSH: 3.0000 mL | INTRAVENOUS | Status: DC | PRN

## 2017-03-15 MED ORDER — SODIUM CHLORIDE 0.9 % WEIGHT BASED INFUSION: 3.0000 mL/kg/h | INTRAVENOUS | Status: DC

## 2017-03-15 MED ORDER — LIDOCAINE HCL (PF) 1 % IJ SOLN
INTRAMUSCULAR | Status: DC | PRN
  Administered 2017-03-15: 18 mL

## 2017-03-15 MED ORDER — SODIUM CHLORIDE 0.9% FLUSH: 3.0000 mL | Freq: Two times a day (BID) | INTRAVENOUS | Status: DC

## 2017-03-15 MED ORDER — ACETAMINOPHEN 325 MG PO TABS
650.0000 mg | ORAL_TABLET | ORAL | Status: DC | PRN
  Administered 2017-03-15: 650 mg via ORAL
  Filled 2017-03-15: qty 2

## 2017-03-15 MED ORDER — SODIUM CHLORIDE 0.9 % IV SOLN
INTRAVENOUS | Status: AC
  Administered 2017-03-15: 13:00:00 via INTRAVENOUS

## 2017-03-15 MED ORDER — IOPAMIDOL (ISOVUE-370) INJECTION 76%
INTRAVENOUS | Status: DC | PRN
  Administered 2017-03-15: 40 mL via INTRA_ARTERIAL

## 2017-03-15 MED ORDER — SODIUM CHLORIDE 0.9 % IV SOLN: 250.0000 mL | INTRAVENOUS | Status: DC | PRN

## 2017-03-15 MED ORDER — LIDOCAINE HCL (PF) 1 % IJ SOLN
INTRAMUSCULAR | Status: AC
  Filled 2017-03-15: qty 30

## 2017-03-15 MED ORDER — ONDANSETRON HCL 4 MG/2ML IJ SOLN
4.0000 mg | Freq: Four times a day (QID) | INTRAMUSCULAR | Status: DC | PRN
  Administered 2017-03-15 – 2017-03-16 (×2): 4 mg via INTRAVENOUS
  Filled 2017-03-15 (×2): qty 2

## 2017-03-15 MED ORDER — HEPARIN (PORCINE) IN NACL 2-0.9 UNIT/ML-% IJ SOLN
INTRAMUSCULAR | Status: AC | PRN
  Administered 2017-03-15: 1000 mL

## 2017-03-15 MED ORDER — SODIUM CHLORIDE 0.9 % WEIGHT BASED INFUSION: 1.0000 mL/kg/h | INTRAVENOUS | Status: DC

## 2017-03-15 MED ORDER — ASPIRIN 81 MG PO CHEW
81.0000 mg | CHEWABLE_TABLET | ORAL | Status: AC
  Administered 2017-03-15: 81 mg via ORAL
  Filled 2017-03-15: qty 1

## 2017-03-15 MED ORDER — ASPIRIN 81 MG PO CHEW: 81.0000 mg | CHEWABLE_TABLET | Freq: Every day | ORAL | Status: DC

## 2017-03-15 SURGICAL SUPPLY — 8 items
CATH 5FR JL3.5 JR4 ANG PIG MP (CATHETERS) ×2 IMPLANT
DEVICE CLOSURE MYNXGRIP 5F (Vascular Products) ×1 IMPLANT
KIT HEART LEFT (KITS) ×2 IMPLANT
PACK CARDIAC CATHETERIZATION (CUSTOM PROCEDURE TRAY) ×2 IMPLANT
SHEATH PINNACLE 5F 10CM (SHEATH) ×1 IMPLANT
TRANSDUCER W/STOPCOCK (MISCELLANEOUS) ×2 IMPLANT
TUBING CIL FLEX 10 FLL-RA (TUBING) ×2 IMPLANT
WIRE EMERALD 3MM-J .035X150CM (WIRE) ×1 IMPLANT

## 2017-03-15 NOTE — Progress Notes (Signed)
Patient continues to stick his fingers/hand down his throat to induce vomiting.  Emesis contains the contents of his lunch.  Portable 2-view Abd X-ray completed.  VS remain stable.  Continuing to closely monitor.

## 2017-03-15 NOTE — Progress Notes (Signed)
Patient c/o nausea and abdominal pain after eating lunch around 2pm.  Zofran IV and Tramadol given.  MD at bedside.  Patient states that he would feel better if he could 'vomit'.  Pain unrelieved after Tramadol.  When this RN returned to patients room, he was sticking his entire hand down his throat, making himself vomit.  MD at bedside at this time.

## 2017-03-15 NOTE — Discharge Instructions (Signed)

## 2017-03-15 NOTE — Interval H&P Note (Signed)
Cath Lab Visit (complete for each Cath Lab visit)  Clinical Evaluation Leading to the Procedure:   ACS: Yes.    Non-ACS:    Anginal Classification: CCS III  Anti-ischemic medical therapy: Maximal Therapy (2 or more classes of medications)  Non-Invasive Test Results: Intermediate-risk stress test findings: cardiac mortality 1-3%/year  Prior CABG: No previous CABG      History and Physical Interval Note:  03/30/2017 10:07 AM  Samuel [name]  has presented today for surgery, with the diagnosis of cp  The various methods of treatment have been discussed with the patient and family. After consideration of risks, benefits and other options for treatment, the patient has consented to  Procedure(s): LEFT HEART CATH AND CORONARY ANGIOGRAPHY (N/A) as a surgical intervention .  The patient's history has been reviewed, patient examined, no change in status, stable for surgery.  I have reviewed the patient's chart and labs.  Questions were answered to the patient's satisfaction.     Samuel [name]

## 2017-03-15 NOTE — Progress Notes (Signed)
PROGRESS NOTE  Samuel [name] ZOX:[id number] DOB: [birth year] DOA: 04/02/2017 PCP: Veryl Speakalone, Gregory D, FNP   LOS: 1 day   Brief Narrative / Interim history: Samuel DragonJoseph A Robin is a 83 y.o. male with medical history significant of hypertension, COPD, GERD, depression, anxiety, aortic stenosis, s/p of TAVR, CHF, alcohol abuse, AAA, CKD2, kyphoscoliosis, who presents with chest pain and shortness of breath.  Assessment & Plan: Principal Problem:   Dyspnea Active Problems:   Alcohol abuse   Essential hypertension, benign   COPD with chronic bronchitis (HCC)   Physical deconditioning   Abdominal aortic aneurysm (HCC)   S/P TAVR (transcatheter aortic valve replacement)   Dehydration   GERD (gastroesophageal reflux disease)   Chronic diastolic CHF (congestive heart failure) (HCC)   CKD (chronic kidney disease), stage II   Anxiety   Hyponatremia   Chest pain  Chest pain -cardiology following, patient underwent stress testing on 12/12 which was positive and showed an area of reversible ischemia. This was followed up by a cardiac catheterization today without significant obstructive disease. -CTA without PE  Free intraperitoneal air  -tiny amount -unclear clinical significance, patient without abdominal pain, tolerating diet and having BMs. -No peritoneal sings, no signs of perforated diverticulitis. D/w Dr. Magnus IvanBlackman with general surgery, no need for any interventions unless he becomes symptomatic. He is afebrile, no WBC -today after cath after having lunch patient has been having worsening abdominal pain, he thinks it was because he was very hungry and ate too fast. Already feeling better -given CT findings will obtain a follow up abdominal XR and monitor overnight. Abdomen soft on exam   COPD with chronic bronchitis -prn albuterol nebs and mucinex   HTN:  -Continue home medications: Amlodipine, metoprolol -IV hydralazine prn  Chronic diastolic CHF -2-D echo and 60/60/17 showed EF  60-65% with grade 1 diastolic dysfunction. Patient does not have leg edema or JVD. CHF is compensated. -Continue metoprolol  GERD -Pepcid  AoCKD-II: Baseline Cre is 1.0-1.2, pt's Cre is 1.39 on admission. Likely due to prerenal secondary to dehydration and continuation of diuretics. -Cr now back to baseline -gentle fluids per cards post cath   Alcohol abuse -Did counseling about the importance of quitting drinking -CIWA protocol -determined to quit  Hyponatremia -Na 130. Mental status is normal. Likely due to alcohol abuse and Lasix use. -Hold her Lasix as above -Na 134 today, stop IVF  Anxiety: -prn xanax  Abdominal aortic aneurysm (HCC)  -CTA on 11/21/16 showed increased size of infrarenal abdominal aortic aneurysm, now measuring 5.2 x 4.6 cm, previously 5.1 x 4.0 cm. Recommend follow up by abdomen and pelvis CTA in 3-6 months, and vascular surgery referral/consultation if not already obtained -May need to give referral to vascular surgeon  S/P TAVR (transcatheter aortic valve replacement): -stable. No acute issues    DVT prophylaxis: Lovenox Code Status: Full code Family Communication: no family at bedside Disposition Plan: home in am   Consultants:   Cardiology   General surgery (phone)  Procedures:   None   Antimicrobials:  None    Subjective: -feeling better prior to cath but following lunch with increased abdominal pain. No chest pain / shortness of breath   Objective: Vitals:   03/25/2017 1120 03/27/2017 1135 03/14/2017 1140 03/07/2017 1300  BP: (!) 166/61 (!) 130/59 (!) 149/63 (!) 145/50  Pulse: 71 65 64 61  Resp: (!) 25 15 17    Temp:    97.7 F (36.5 C)  TempSrc:    Oral  SpO2: 100%  99% 98% 97%  Weight:      Height:        Intake/Output Summary (Last 24 hours) at 03/14/2017 1534 Last data filed at 03/10/2017 1309 Gross per 24 hour  Intake 560 ml  Output 200 ml  Net 360 ml   Filed Weights   03/10/2017 0650 03/14/17 0315 03/10/2017 0423    Weight: 63.7 kg (140 lb 8 oz) 65 kg (143 lb 3.2 oz) 64 kg (141 lb 1.6 oz)    Examination:  Constitutional: NAD, calm, comfortable Eyes:  lids and conjunctivae normal Neck: normal, supple Respiratory: clear to auscultation bilaterally, no wheezing, no crackles. Normal respiratory effort.  Cardiovascular: Regular rate and rhythm, no murmurs / rubs / gallops. No LE edema.  Abdomen: no tenderness to palpation, no guarding. BS + Neurologic: non focal   Data Reviewed: I have independently reviewed following labs and imaging studies   CBC: Recent Labs  Lab 03/09/2017 0308  WBC 9.2  HGB 14.2  HCT 40.5  MCV 88.6  PLT 170   Basic Metabolic Panel: Recent Labs  Lab 03/09/2017 0308 03/03/2017 1024 03/14/17 0547  NA 130*  --  134*  K 4.2  --  4.1  CL 91*  --  94*  CO2 25  --  32  GLUCOSE 114*  --  104*  BUN 18  --  18  CREATININE 1.39*  --  1.22  CALCIUM 8.4*  --  8.2*  MG  --  1.9 2.0   GFR: Estimated Creatinine Clearance: 37.9 mL/min (by C-G formula based on SCr of 1.22 mg/dL). Liver Function Tests: Recent Labs  Lab 03/25/2017 0308  AST 43*  ALT 13*  ALKPHOS 91  BILITOT 0.9  PROT 7.4  ALBUMIN 3.4*   No results for input(s): LIPASE, AMYLASE in the last 168 hours. No results for input(s): AMMONIA in the last 168 hours. Coagulation Profile: Recent Labs  Lab 03/14/2017 0938  INR 1.09   Cardiac Enzymes: Recent Labs  Lab 03/31/2017 0512 03/09/2017 1024 03/19/2017 1610  TROPONINI <0.03 <0.03 <0.03   BNP (last 3 results) No results for input(s): PROBNP in the last 8760 hours. HbA1C: Recent Labs    03/14/17 0547  HGBA1C 5.4   CBG: No results for input(s): GLUCAP in the last 168 hours. Lipid Profile: Recent Labs    03/14/17 0547  CHOL 110  HDL 42  LDLCALC 55  TRIG 66  CHOLHDL 2.6   Thyroid Function Tests: No results for input(s): TSH, T4TOTAL, FREET4, T3FREE, THYROIDAB in the last 72 hours. Anemia Panel: No results for input(s): VITAMINB12, FOLATE, FERRITIN,  TIBC, IRON, RETICCTPCT in the last 72 hours. Urine analysis:    Component Value Date/Time   COLORURINE YELLOW 03/19/2017 0706   APPEARANCEUR CLEAR 03/06/2017 0706   LABSPEC 1.021 03/18/2017 0706   PHURINE 6.0 03/03/2017 0706   GLUCOSEU NEGATIVE 03/06/2017 0706   GLUCOSEU NEG mg/dL 09/81/191404/12/2006 [id number]   HGBUR SMALL (A) 03/05/2017 0706   BILIRUBINUR NEGATIVE 03/27/2017 0706   KETONESUR NEGATIVE 03/05/2017 0706   PROTEINUR NEGATIVE 04/01/2017 0706   UROBILINOGEN 0.2 01/17/2013 1351   NITRITE NEGATIVE 04/01/2017 0706   LEUKOCYTESUR NEGATIVE 03/28/2017 0706   Sepsis Labs: Invalid input(s): PROCALCITONIN, LACTICIDVEN  No results found for this or any previous visit (from the past 240 hour(s)).    Radiology Studies: Ct Abdomen Pelvis Wo Contrast  Result Date: 03/14/2017 CLINICAL DATA:  Chest and abdominal pain. A AS suspected. Free air seen on chest CT. EXAM: CT ABDOMEN AND PELVIS WITHOUT  CONTRAST TECHNIQUE: Multidetector CT imaging of the abdomen and pelvis was performed following the standard protocol without IV contrast. COMPARISON:  11/21/2016.  09/20/2015.  Chest CT earlier same day. FINDINGS: Lower chest: Lung bases again show chronic scarring/volume loss in the right lower lobe. Aortic valve replacement. Hepatobiliary: Small amount of free air again evident around the margins of the liver. No hepatic parenchymal lesion. No calcified gallstones. Pancreas: Negative Spleen: Normal Adrenals/Urinary Tract: Adrenal glands are normal. Right renal cysts, the largest measuring 4 cm at the upper pole. No sign of mass or obstruction. Stomach/Bowel: No evidence of advanced bowel pathology to explain the small amount of free air. The patient has have advanced diverticulosis affecting the left colon, without imaging evidence of definable diverticulitis. Perhaps there was perforation of a diverticulum without secondary surrounding inflammatory changes. The patient does have some swirling of the root of the  mesentery but there is not any sign of small bowel ischemia or dilatation. Therefore I doubt of the significance that finding. Vascular/Lymphatic: Advanced aortic atherosclerosis as seen previously with an irregular infrarenal aneurysm projecting primarily towards the right. Today's study was not done as a contrast-enhanced exam. Measurement in the same location is not significantly changed, measuring 5.3 x 4.5 cm as opposed to 5.2 x 4.6 cm on the previous exam. No evidence of retroperitoneal bleeding. IVC is normal. No retroperitoneal adenopathy. Reproductive: Normal Other: Small amount of free air as described above, most likely secondary to perforated diverticulum without secondary findings of diverticulitis. Musculoskeletal: No acute finding. Chronic spinal curvature in degenerative changes. IMPRESSION: Tiny amount of free intraperitoneal air as shown at chest CT. No evidence of active bowel pathology based on the abdominal CT findings. Extensive diverticulosis of the left colon. Perhaps the free air is secondary to a perforated diverticulum without frank diverticulitis. Follow-up clinically with attention to the possibility of developing peritonitis. There is a swirling pattern at the root of the small bowel mesentery but there is no sign of small bowel ischemia or dilatation. The significance of the finding is doubtful. Stable infrarenal abdominal aortic aneurysm with maximal transverse diameter of 5.3 x 4.5 cm, eccentric towards the right. Recommend followup by abdomen and pelvis CTA in 3-6 months, and vascular surgery referral/consultation if not already obtained. This recommendation follows ACR consensus guidelines: White Paper of the ACR Incidental Findings Committee II on Vascular Findings. J Am Coll Radiol 2013; 10:789-794. Electronically Signed   By: Paulina Fusi M.D.   On: 03/14/2017 07:17   Nm Myocar Multi W/spect W/wall Motion / Ef  Result Date: 03/14/2017  There was no ST segment deviation  noted during stress.  No T wave inversion was noted during stress.  Defect 1: There is a large defect of moderate severity present in the basal inferior, basal inferolateral, basal anterolateral, mid inferior and mid inferolateral location.  Findings consistent with ischemia.  This is an intermediate risk study.  The left ventricular ejection fraction is normal (55-65%).  There is an artifact, however there is a large reversible defect in the basal and mid inferior, inferolateral and basel anterolateral walls suspicious for ischemia in the LCX territory.      Scheduled Meds: . amLODipine  7.5 mg Oral Daily  . [START ON 03/19/2017] aspirin  81 mg Oral Daily  . aspirin EC  325 mg Oral Daily  . cholecalciferol  5,000 Units Oral Daily  . enoxaparin (LOVENOX) injection  40 mg Subcutaneous Q24H  . famotidine  20 mg Oral Daily  . folic acid  1 mg Oral Daily  . LORazepam  0-4 mg Intravenous Q12H  . magnesium oxide  200 mg Oral BID  . metoprolol succinate  25 mg Oral Daily  . multivitamin with minerals  1 tablet Oral Daily  . selenium  100 mcg Oral Daily  . sodium chloride flush  3 mL Intravenous Q12H  . thiamine  100 mg Oral Daily   Or  . thiamine  100 mg Intravenous Daily  . vitamin B-12  50 mcg Oral Daily   Continuous Infusions: . sodium chloride       Pamella Pert, MD, PhD [facility] Pager [phone]  If 7PM-7AM, please contact night-coverage [email or web address] Password [facility] 03/03/2017, 3:34 PM

## 2017-03-15 NOTE — Progress Notes (Addendum)
Kirtland BouchardK. Schorr with [facility] paged the following:  1955: 6E24:[place],J:s/p R groin cath. now with increased pain in flanks/hips and abd with N/V. VSS. No signs of flank bruising. KUB unremarkable.   2003: received orders from K. Schorr "to [facility] with concerns."   2100: Toniann FailKakrakandy MD paged: [phone]:[place],J:s/p R groin cath. now with increased pain in flanks/hips and abd with N/V. VSS. No signs of flank bruising. KUB unremarkable. now sleep  Patient is now resting in bed post PRN medications.    2115Teddy Spike: Karkakandy MD to place orders for CBC. Monitor for drop in H/H and continued pain.

## 2017-03-15 NOTE — H&P (View-Only) (Signed)
Progress Note  Patient Name: Samuel [name] Date of Encounter: 03/25/2017  Primary Cardiologist: Olga MillersBrian Crenshaw, MD   Subjective   No further chest pain. Discussed abnormal myovue and need for cath today  Inpatient Medications    Scheduled Meds: . amLODipine  7.5 mg Oral Daily  . aspirin EC  325 mg Oral Daily  . cholecalciferol  5,000 Units Oral Daily  . enoxaparin (LOVENOX) injection  40 mg Subcutaneous Q24H  . famotidine  20 mg Oral Daily  . folic acid  1 mg Oral Daily  . LORazepam  0-4 mg Intravenous Q12H  . magnesium oxide  200 mg Oral BID  . metoprolol succinate  25 mg Oral Daily  . multivitamin with minerals  1 tablet Oral Daily  . selenium  100 mcg Oral Daily  . thiamine  100 mg Oral Daily   Or  . thiamine  100 mg Intravenous Daily  . vitamin B-12  50 mcg Oral Daily   Continuous Infusions:  PRN Meds: acetaminophen, albuterol, ALPRAZolam, dextromethorphan-guaiFENesin, hydrALAZINE, LORazepam **OR** LORazepam, morphine injection, ondansetron (ZOFRAN) IV, traMADol, zolpidem   Vital Signs    Vitals:   03/14/17 1132 03/14/17 1438 03/14/17 2112 03/14/2017 0423  BP: (!) 138/57 (!) 114/53 (!) 124/58 (!) 116/41  Pulse:  84 75 (!) 59  Resp:  20    Temp:  97.7 F (36.5 C) 97.7 F (36.5 C) 98 F (36.7 C)  TempSrc:  Axillary Oral Axillary  SpO2:  98% 97% 97%  Weight:    141 lb 1.6 oz (64 kg)  Height:        Intake/Output Summary (Last 24 hours) at 03/26/2017 0734 Last data filed at 03/14/2017 2100 Gross per 24 hour  Intake 1100 ml  Output 250 ml  Net 850 ml   Filed Weights   03/20/2017 0650 03/14/17 0315 03/03/2017 0423  Weight: 140 lb 8 oz (63.7 kg) 143 lb 3.2 oz (65 kg) 141 lb 1.6 oz (64 kg)    Telemetry    NSR 03/29/2017  - Personally Reviewed  ECG    NSR normal ECG - Personally Reviewed  Physical Exam  BP (!) 116/41 (BP Location: Right Arm)   Pulse (!) 59   Temp 98 F (36.7 C) (Axillary)   Resp 20   Ht 5\' 8"  (1.727 m)   Wt 141 lb 1.6 oz (64  kg)   SpO2 97%   BMI 21.45 kg/m  Affect appropriate Healthy:  appears stated age HEENT: normal Neck supple with no adenopathy JVP normal no bruits no thyromegaly Lungs clear with no wheezing and good diaphragmatic motion Heart:  S1/S2 SEM through TAVR valve no AR murmur, no rub, gallop or click PMI normal Abdomen: benighn, BS positve, no tenderness,  AAA palpable no bruit.  No HSM or HJR Distal pulses intact with no bruits No edema Neuro non-focal Skin warm and dry No muscular weakness   Labs    Chemistry Recent Labs  Lab 03/30/2017 0308 03/14/17 0547  NA 130* 134*  K 4.2 4.1  CL 91* 94*  CO2 25 32  GLUCOSE 114* 104*  BUN 18 18  CREATININE 1.39* 1.22  CALCIUM 8.4* 8.2*  PROT 7.4  --   ALBUMIN 3.4*  --   AST 43*  --   ALT 13*  --   ALKPHOS 91  --   BILITOT 0.9  --   GFRNONAA 44* 51*  GFRAA 51* 59*  ANIONGAP 14 8     Hematology Recent Labs  Lab 03/29/2017 0308  WBC 9.2  RBC 4.57  HGB 14.2  HCT 40.5  MCV 88.6  MCH 31.1  MCHC 35.1  RDW 12.2  PLT 170    Cardiac Enzymes Recent Labs  Lab 03/21/2017 0512 03/07/2017 1024 03/06/2017 1610  TROPONINI <0.03 <0.03 <0.03    Recent Labs  Lab 03/25/2017 0314  TROPIPOC 0.01     BNP Recent Labs  Lab 03/19/2017 0512  BNP 282.1*     DDimer  Recent Labs  Lab 03/28/2017 0308  DDIMER 2.54*     Radiology    Ct Abdomen Pelvis Wo Contrast  Result Date: 03/14/2017 CLINICAL DATA:  Chest and abdominal pain. A AS suspected. Free air seen on chest CT. EXAM: CT ABDOMEN AND PELVIS WITHOUT CONTRAST TECHNIQUE: Multidetector CT imaging of the abdomen and pelvis was performed following the standard protocol without IV contrast. COMPARISON:  11/21/2016.  09/20/2015.  Chest CT earlier same day. FINDINGS: Lower chest: Lung bases again show chronic scarring/volume loss in the right lower lobe. Aortic valve replacement. Hepatobiliary: Small amount of free air again evident around the margins of the liver. No hepatic parenchymal  lesion. No calcified gallstones. Pancreas: Negative Spleen: Normal Adrenals/Urinary Tract: Adrenal glands are normal. Right renal cysts, the largest measuring 4 cm at the upper pole. No sign of mass or obstruction. Stomach/Bowel: No evidence of advanced bowel pathology to explain the small amount of free air. The patient has have advanced diverticulosis affecting the left colon, without imaging evidence of definable diverticulitis. Perhaps there was perforation of a diverticulum without secondary surrounding inflammatory changes. The patient does have some swirling of the root of the mesentery but there is not any sign of small bowel ischemia or dilatation. Therefore I doubt of the significance that finding. Vascular/Lymphatic: Advanced aortic atherosclerosis as seen previously with an irregular infrarenal aneurysm projecting primarily towards the right. Today's study was not done as a contrast-enhanced exam. Measurement in the same location is not significantly changed, measuring 5.3 x 4.5 cm as opposed to 5.2 x 4.6 cm on the previous exam. No evidence of retroperitoneal bleeding. IVC is normal. No retroperitoneal adenopathy. Reproductive: Normal Other: Small amount of free air as described above, most likely secondary to perforated diverticulum without secondary findings of diverticulitis. Musculoskeletal: No acute finding. Chronic spinal curvature in degenerative changes. IMPRESSION: Tiny amount of free intraperitoneal air as shown at chest CT. No evidence of active bowel pathology based on the abdominal CT findings. Extensive diverticulosis of the left colon. Perhaps the free air is secondary to a perforated diverticulum without frank diverticulitis. Follow-up clinically with attention to the possibility of developing peritonitis. There is a swirling pattern at the root of the small bowel mesentery but there is no sign of small bowel ischemia or dilatation. The significance of the finding is doubtful. Stable  infrarenal abdominal aortic aneurysm with maximal transverse diameter of 5.3 x 4.5 cm, eccentric towards the right. Recommend followup by abdomen and pelvis CTA in 3-6 months, and vascular surgery referral/consultation if not already obtained. This recommendation follows ACR consensus guidelines: White Paper of the ACR Incidental Findings Committee II on Vascular Findings. J Am Coll Radiol 2013; 10:789-794. Electronically Signed   By: Paulina FusiMark  Shogry M.D.   On: 03/14/2017 07:17   Nm Myocar Multi W/spect W/wall Motion / Ef  Result Date: 03/14/2017  There was no ST segment deviation noted during stress.  No T wave inversion was noted during stress.  Defect 1: There is a large defect of moderate severity  present in the basal inferior, basal inferolateral, basal anterolateral, mid inferior and mid inferolateral location.  Findings consistent with ischemia.  This is an intermediate risk study.  The left ventricular ejection fraction is normal (55-65%).  There is an artifact, however there is a large reversible defect in the basal and mid inferior, inferolateral and basel anterolateral walls suspicious for ischemia in the LCX territory.     Cardiac Studies   Echo EF normal grade 2 diastolic TAVR valve with mean gradient 9 mmHg And mild AR   Patient Profile     Samuel [name] is a 83 y.o. male with a hx of nonobstructive CAD by [facility] 10/2011, severe aortic stenosis s/p TAVR 01/21/2013, infrarenal abdominal aortic saccular aneurysm measuring 5.1 x 4 cm by CTA 09/2015, diastolic dysfunction, CKD stage II,  HTN, EtOH abuse, COPD 2/2 prior tobacco abuse, chronic low back pain, and GERD who is being seen today for the evaluation of chest pain and SOB at the request of Dr. Clyde Lundborg.    Assessment & Plan    1. Chest pain with moderate risk for cardiac etiology: -Currently, symptom free -abnormal myovue suggesting inferolateral ischemia for cath today lab called Orders written Would not do LV gram or cross AVR     2. Diastolic dysfunction: grade 2 on echo this admission  -He does not appear volume overloaded Cr better hold lasix home dose today as well He looks dry   3. Severe aortic stenosis s/p AVR: mean gradient stable 9 mmHg mild AR   4. Abnormal CT/possible free air noted in the abdomen: - CT diverticulosis  Not acting like acute abdomen    5. Acute on CKD stage II: -Gentle IV fluids -hold lasix today Cr improved 1.2 this am   6. Hyponatremia: improved 134 today  -Likely 2/2 alcohol abuse and Lasix -Lasix on hold given AKI  7. Alcohol abuse: -CIWA protocol -Check magnesium with recommendation to replete to goal of > 2.0  8. AAA: -CTA abdomen stable 5.3 by CT f/u VVS   9. Hypertensive heart disease: -Amlodipine and Toprol -Titrate as needed     For questions or updates, please contact [facility] Please consult [email or web address] for contact info under Cardiology/STEMI.      Signed, Charlton Haws, MD  03/24/2017, 7:34 AM

## 2017-03-15 NOTE — [place] FL2 (Signed)
[place] MEDICAID FL2 LEVEL OF CARE SCREENING TOOL     IDENTIFICATION  Patient Name: Samuel [name] Birthdate: April 03, 1927 Sex: male Admission Date (Current Location): 03/03/2017  [facility] and [place] Number:  Producer, television/film/video and Address:  [facility], [address] [place], [place] [address]      Provider Number: [id number]  Attending Physician Name and Address:  Leatha Gilding, MD  Relative Name and Phone Number:  Madelon Lips, daughter, [phone]    Current Level of Care: Hospital Recommended Level of Care: Skilled Nursing Facility Prior Approval Number:    Date Approved/Denied:   PASRR Number: [id number]  Discharge Plan: SNF    Current Diagnoses: Patient Active Problem List   Diagnosis Date Noted  . GERD (gastroesophageal reflux disease) 03/12/2017  . Chronic diastolic CHF (congestive heart failure) (HCC) 03/23/2017  . CKD (chronic kidney disease), stage II 03/04/2017  . Anxiety 03/04/2017  . Hyponatremia 03/04/2017  . Chest pain 03/21/2017  . Dyspnea 03/27/2017  . Decreased mobility 11/27/2016  . Medicare annual wellness visit, subsequent 07/31/2016  . Nausea without vomiting 04/06/2015  . Protein-calorie malnutrition, severe (HCC) 02/24/2015  . Dehydration 02/23/2015  . Osteoarthritis of right hip 01/29/2013  . 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 Abdominal aortic aneurysm (HCC) 10/29/2011  . Scoliosis/kyphoscoliosis 10/28/2011  . Alcohol intoxication (HCC) 10/24/2011  . Physical deconditioning 10/24/2011  . Generalized weakness 10/24/2011  . Altered mental status 10/24/2011  . Fever and chills 10/24/2011  . Dysphagia 10/24/2011  . Essential hypertension, benign 03/04/2010  . COPD with chronic bronchitis (HCC) 09/03/2008  . DEPRESSION 07/19/2007  . CELLULITIS AND ABSCESS OF FOOT EXCEPT TOES 07/19/2007  . DIZZINESS, CHRONIC 07/11/2007  . Alcohol abuse 07/08/2007  . HEADACHE 07/08/2007   . HEART MURMUR 07/11/2006  . DYSPNEA ON EXERTION 07/11/2006  . RLQ PAIN 07/11/2006    Orientation RESPIRATION BLADDER Height & Weight     Self, Time, Situation, Place  Normal Continent Weight: 141 lb 1.6 oz (64 kg) Height:  5\' 8"  (172.7 cm)  BEHAVIORAL SYMPTOMS/MOOD NEUROLOGICAL BOWEL NUTRITION STATUS      Continent Diet(please see DC summary)  AMBULATORY STATUS COMMUNICATION OF NEEDS Skin   Extensive Assist Verbally Normal                       Personal Care Assistance Level of Assistance  Bathing, Feeding, Dressing Bathing Assistance: Maximum assistance Feeding assistance: Limited assistance Dressing Assistance: Maximum assistance     Functional Limitations Info  Sight, Hearing, Speech Sight Info: Impaired Hearing Info: Adequate Speech Info: Adequate    SPECIAL CARE FACTORS FREQUENCY  PT (By licensed PT)     PT Frequency: 5x/week              Contractures Contractures Info: Not present    Additional Factors Info  Code Status, Allergies Code Status Info: Full Allergies Info: No Known Allergies           Current Medications (03/15/2017):  This is the current hospital active medication list Current Facility-Administered Medications  Medication Dose Route Frequency Provider Last Rate Last Dose  . 0.9 %  sodium chloride infusion  250 mL Intravenous PRN Runell Gess, MD      . acetaminophen (TYLENOL) tablet 650 mg  650 mg Oral Q4H PRN Runell Gess, MD      . albuterol (PROVENTIL) (2.5 MG/3ML) 0.083% nebulizer solution 2.5 mg  2.5 mg Nebulization Q6H PRN Clyde Lundborg,  Brien FewXilin, MD      . ALPRAZolam Prudy Feeler(XANAX) tablet 0.5 mg  0.5 mg Oral BID PRN Lorretta HarpNiu, Xilin, MD   0.5 mg at 03/12/2017 [id number]  . amLODipine (NORVASC) tablet 7.5 mg  7.5 mg Oral Daily Lorretta HarpNiu, Xilin, MD   7.5 mg at 04/02/2017 0853  . [START ON March 06, 2017] aspirin chewable tablet 81 mg  81 mg Oral Daily Runell GessBerry, Jonathan J, MD      . aspirin EC tablet 325 mg  325 mg Oral Daily Lorretta HarpNiu, Xilin, MD   325 mg at 03/14/17  1413  . cholecalciferol (VITAMIN D) tablet 5,000 Units  5,000 Units Oral Daily Lorretta HarpNiu, Xilin, MD   5,000 Units at 03/25/2017 [phone]  . dextromethorphan-guaiFENesin (MUCINEX DM) 30-600 MG per 12 hr tablet 1 tablet  1 tablet Oral BID PRN Lorretta HarpNiu, Xilin, MD      . enoxaparin (LOVENOX) injection 40 mg  40 mg Subcutaneous Q24H Lorretta HarpNiu, Xilin, MD   Stopped at 04/02/2017 [phone]  . famotidine (PEPCID) tablet 20 mg  20 mg Oral Daily Lorretta HarpNiu, Xilin, MD   20 mg at 03/08/2017 0853  . folic acid (FOLVITE) tablet 1 mg  1 mg Oral Daily Lorretta HarpNiu, Xilin, MD   1 mg at 04/01/2017 0851  . hydrALAZINE (APRESOLINE) injection 5 mg  5 mg Intravenous Q2H PRN Lorretta HarpNiu, Xilin, MD      . LORazepam (ATIVAN) injection 0-4 mg  0-4 mg Intravenous Q12H Lorretta HarpNiu, Xilin, MD      . LORazepam (ATIVAN) tablet 1 mg  1 mg Oral Q6H PRN Lorretta HarpNiu, Xilin, MD       Or  . LORazepam (ATIVAN) injection 1 mg  1 mg Intravenous Q6H PRN Lorretta HarpNiu, Xilin, MD      . magnesium oxide (MAG-OX) tablet 200 mg  200 mg Oral BID Lorretta HarpNiu, Xilin, MD   200 mg at 03/14/2017 0853  . metoprolol succinate (TOPROL-XL) 24 hr tablet 25 mg  25 mg Oral Daily Lorretta HarpNiu, Xilin, MD   25 mg at 03/23/2017 0851  . morphine 2 MG/ML injection 2 mg  2 mg Intravenous Q1H PRN Runell GessBerry, Jonathan J, MD      . multivitamin with minerals tablet 1 tablet  1 tablet Oral Daily Lorretta HarpNiu, Xilin, MD   1 tablet at 03/19/2017 0855  . ondansetron (ZOFRAN) injection 4 mg  4 mg Intravenous Q6H PRN Runell GessBerry, Jonathan J, MD   4 mg at 03/27/2017 1414  . selenium tablet 100 mcg  100 mcg Oral Daily Lorretta HarpNiu, Xilin, MD   100 mcg at 03/12/2017 0851  . sodium chloride flush (NS) 0.9 % injection 3 mL  3 mL Intravenous Q12H Runell GessBerry, Jonathan J, MD      . sodium chloride flush (NS) 0.9 % injection 3 mL  3 mL Intravenous PRN Runell GessBerry, Jonathan J, MD      . thiamine (VITAMIN B-1) tablet 100 mg  100 mg Oral Daily Lorretta HarpNiu, Xilin, MD   100 mg at 03/26/2017 [id number]   Or  . thiamine (B-1) injection 100 mg  100 mg Intravenous Daily Lorretta HarpNiu, Xilin, MD      . traMADol Janean Sark(ULTRAM) tablet 50 mg  50 mg Oral BID PRN Lorretta HarpNiu,  Xilin, MD   50 mg at 03/30/2017 1511  . vitamin B-12 (CYANOCOBALAMIN) tablet 50 mcg  50 mcg Oral Daily Lorretta HarpNiu, Xilin, MD   50 mcg at 04/01/2017 0851  . zolpidem (AMBIEN) tablet 5 mg  5 mg Oral QHS PRN Lorretta HarpNiu, Xilin, MD         Discharge Medications: Please see  discharge summary for a list of discharge medications.  Relevant Imaging Results:  Relevant Lab Results:   Additional Information SSN: [id number]  Abigail ButtsSusan Vernona Peake, LCSW

## 2017-03-15 NOTE — Progress Notes (Signed)
To Cath Lab via bed.

## 2017-03-15 NOTE — Progress Notes (Signed)
Progress Note  Patient Name: Samuel [name] Date of Encounter: 03/25/2017  Primary Cardiologist: Olga MillersBrian Crenshaw, MD   Subjective   No further chest pain. Discussed abnormal myovue and need for cath today  Inpatient Medications    Scheduled Meds: . amLODipine  7.5 mg Oral Daily  . aspirin EC  325 mg Oral Daily  . cholecalciferol  5,000 Units Oral Daily  . enoxaparin (LOVENOX) injection  40 mg Subcutaneous Q24H  . famotidine  20 mg Oral Daily  . folic acid  1 mg Oral Daily  . LORazepam  0-4 mg Intravenous Q12H  . magnesium oxide  200 mg Oral BID  . metoprolol succinate  25 mg Oral Daily  . multivitamin with minerals  1 tablet Oral Daily  . selenium  100 mcg Oral Daily  . thiamine  100 mg Oral Daily   Or  . thiamine  100 mg Intravenous Daily  . vitamin B-12  50 mcg Oral Daily   Continuous Infusions:  PRN Meds: acetaminophen, albuterol, ALPRAZolam, dextromethorphan-guaiFENesin, hydrALAZINE, LORazepam **OR** LORazepam, morphine injection, ondansetron (ZOFRAN) IV, traMADol, zolpidem   Vital Signs    Vitals:   03/14/17 1132 03/14/17 1438 03/14/17 2112 03/14/2017 0423  BP: (!) 138/57 (!) 114/53 (!) 124/58 (!) 116/41  Pulse:  84 75 (!) 59  Resp:  20    Temp:  97.7 F (36.5 C) 97.7 F (36.5 C) 98 F (36.7 C)  TempSrc:  Axillary Oral Axillary  SpO2:  98% 97% 97%  Weight:    141 lb 1.6 oz (64 kg)  Height:        Intake/Output Summary (Last 24 hours) at 03/26/2017 0734 Last data filed at 03/14/2017 2100 Gross per 24 hour  Intake 1100 ml  Output 250 ml  Net 850 ml   Filed Weights   03/20/2017 0650 03/14/17 0315 03/03/2017 0423  Weight: 140 lb 8 oz (63.7 kg) 143 lb 3.2 oz (65 kg) 141 lb 1.6 oz (64 kg)    Telemetry    NSR 03/29/2017  - Personally Reviewed  ECG    NSR normal ECG - Personally Reviewed  Physical Exam  BP (!) 116/41 (BP Location: Right Arm)   Pulse (!) 59   Temp 98 F (36.7 C) (Axillary)   Resp 20   Ht 5\' 8"  (1.727 m)   Wt 141 lb 1.6 oz (64  kg)   SpO2 97%   BMI 21.45 kg/m  Affect appropriate Healthy:  appears stated age HEENT: normal Neck supple with no adenopathy JVP normal no bruits no thyromegaly Lungs clear with no wheezing and good diaphragmatic motion Heart:  S1/S2 SEM through TAVR valve no AR murmur, no rub, gallop or click PMI normal Abdomen: benighn, BS positve, no tenderness,  AAA palpable no bruit.  No HSM or HJR Distal pulses intact with no bruits No edema Neuro non-focal Skin warm and dry No muscular weakness   Labs    Chemistry Recent Labs  Lab 03/30/2017 0308 03/14/17 0547  NA 130* 134*  K 4.2 4.1  CL 91* 94*  CO2 25 32  GLUCOSE 114* 104*  BUN 18 18  CREATININE 1.39* 1.22  CALCIUM 8.4* 8.2*  PROT 7.4  --   ALBUMIN 3.4*  --   AST 43*  --   ALT 13*  --   ALKPHOS 91  --   BILITOT 0.9  --   GFRNONAA 44* 51*  GFRAA 51* 59*  ANIONGAP 14 8     Hematology Recent Labs  Lab 03/29/2017 0308  WBC 9.2  RBC 4.57  HGB 14.2  HCT 40.5  MCV 88.6  MCH 31.1  MCHC 35.1  RDW 12.2  PLT 170    Cardiac Enzymes Recent Labs  Lab 03/21/2017 0512 03/07/2017 1024 03/06/2017 1610  TROPONINI <0.03 <0.03 <0.03    Recent Labs  Lab 03/25/2017 0314  TROPIPOC 0.01     BNP Recent Labs  Lab 03/19/2017 0512  BNP 282.1*     DDimer  Recent Labs  Lab 03/28/2017 0308  DDIMER 2.54*     Radiology    Ct Abdomen Pelvis Wo Contrast  Result Date: 03/14/2017 CLINICAL DATA:  Chest and abdominal pain. A AS suspected. Free air seen on chest CT. EXAM: CT ABDOMEN AND PELVIS WITHOUT CONTRAST TECHNIQUE: Multidetector CT imaging of the abdomen and pelvis was performed following the standard protocol without IV contrast. COMPARISON:  11/21/2016.  09/20/2015.  Chest CT earlier same day. FINDINGS: Lower chest: Lung bases again show chronic scarring/volume loss in the right lower lobe. Aortic valve replacement. Hepatobiliary: Small amount of free air again evident around the margins of the liver. No hepatic parenchymal  lesion. No calcified gallstones. Pancreas: Negative Spleen: Normal Adrenals/Urinary Tract: Adrenal glands are normal. Right renal cysts, the largest measuring 4 cm at the upper pole. No sign of mass or obstruction. Stomach/Bowel: No evidence of advanced bowel pathology to explain the small amount of free air. The patient has have advanced diverticulosis affecting the left colon, without imaging evidence of definable diverticulitis. Perhaps there was perforation of a diverticulum without secondary surrounding inflammatory changes. The patient does have some swirling of the root of the mesentery but there is not any sign of small bowel ischemia or dilatation. Therefore I doubt of the significance that finding. Vascular/Lymphatic: Advanced aortic atherosclerosis as seen previously with an irregular infrarenal aneurysm projecting primarily towards the right. Today's study was not done as a contrast-enhanced exam. Measurement in the same location is not significantly changed, measuring 5.3 x 4.5 cm as opposed to 5.2 x 4.6 cm on the previous exam. No evidence of retroperitoneal bleeding. IVC is normal. No retroperitoneal adenopathy. Reproductive: Normal Other: Small amount of free air as described above, most likely secondary to perforated diverticulum without secondary findings of diverticulitis. Musculoskeletal: No acute finding. Chronic spinal curvature in degenerative changes. IMPRESSION: Tiny amount of free intraperitoneal air as shown at chest CT. No evidence of active bowel pathology based on the abdominal CT findings. Extensive diverticulosis of the left colon. Perhaps the free air is secondary to a perforated diverticulum without frank diverticulitis. Follow-up clinically with attention to the possibility of developing peritonitis. There is a swirling pattern at the root of the small bowel mesentery but there is no sign of small bowel ischemia or dilatation. The significance of the finding is doubtful. Stable  infrarenal abdominal aortic aneurysm with maximal transverse diameter of 5.3 x 4.5 cm, eccentric towards the right. Recommend followup by abdomen and pelvis CTA in 3-6 months, and vascular surgery referral/consultation if not already obtained. This recommendation follows ACR consensus guidelines: White Paper of the ACR Incidental Findings Committee II on Vascular Findings. J Am Coll Radiol 2013; 10:789-794. Electronically Signed   By: Paulina FusiMark  Shogry M.D.   On: 03/14/2017 07:17   Nm Myocar Multi W/spect W/wall Motion / Ef  Result Date: 03/14/2017  There was no ST segment deviation noted during stress.  No T wave inversion was noted during stress.  Defect 1: There is a large defect of moderate severity  present in the basal inferior, basal inferolateral, basal anterolateral, mid inferior and mid inferolateral location.  Findings consistent with ischemia.  This is an intermediate risk study.  The left ventricular ejection fraction is normal (55-65%).  There is an artifact, however there is a large reversible defect in the basal and mid inferior, inferolateral and basel anterolateral walls suspicious for ischemia in the LCX territory.     Cardiac Studies   Echo EF normal grade 2 diastolic TAVR valve with mean gradient 9 mmHg And mild AR   Patient Profile     Samuel [name] is a 83 y.o. male with a hx of nonobstructive CAD by [facility] 10/2011, severe aortic stenosis s/p TAVR 01/21/2013, infrarenal abdominal aortic saccular aneurysm measuring 5.1 x 4 cm by CTA 09/2015, diastolic dysfunction, CKD stage II,  HTN, EtOH abuse, COPD 2/2 prior tobacco abuse, chronic low back pain, and GERD who is being seen today for the evaluation of chest pain and SOB at the request of Dr. Clyde Lundborg.    Assessment & Plan    1. Chest pain with moderate risk for cardiac etiology: -Currently, symptom free -abnormal myovue suggesting inferolateral ischemia for cath today lab called Orders written Would not do LV gram or cross AVR     2. Diastolic dysfunction: grade 2 on echo this admission  -He does not appear volume overloaded Cr better hold lasix home dose today as well He looks dry   3. Severe aortic stenosis s/p AVR: mean gradient stable 9 mmHg mild AR   4. Abnormal CT/possible free air noted in the abdomen: - CT diverticulosis  Not acting like acute abdomen    5. Acute on CKD stage II: -Gentle IV fluids -hold lasix today Cr improved 1.2 this am   6. Hyponatremia: improved 134 today  -Likely 2/2 alcohol abuse and Lasix -Lasix on hold given AKI  7. Alcohol abuse: -CIWA protocol -Check magnesium with recommendation to replete to goal of > 2.0  8. AAA: -CTA abdomen stable 5.3 by CT f/u VVS   9. Hypertensive heart disease: -Amlodipine and Toprol -Titrate as needed     For questions or updates, please contact [facility] Please consult [email or web address] for contact info under Cardiology/STEMI.      Signed, Charlton Haws, MD  03/08/2017, 7:34 AM

## 2017-03-15 NOTE — Clinical Social Work Note (Signed)
Clinical Social Work Assessment  Patient Details  Name: Samuel [name] MRN: [id number] Date of Birth: 08-Oct-1926  Date of referral:  03/09/2017               Reason for consult:  Facility Placement                Permission sought to share information with:  Facility Sport and exercise psychologist, Family Supports Permission granted to share information::  Yes, Verbal Permission Granted  Name::     Samuel [name]  Agency::  SNFs  Relationship::  daughter  Contact Information:  [phone]  Housing/Transportation Living arrangements for the past 2 months:  Apartment Source of Information:  Patient Patient Interpreter Needed:  None Criminal Activity/Legal Involvement Pertinent to Current Situation/Hospitalization:  No - Comment as needed Significant Relationships:  Adult Children, Other Family Members Lives with:  Self Do you feel safe going back to the place where you live?  Yes Need for family participation in patient care:  Yes (Comment)  Care giving concerns: Patient from home alone. PT recommending SNF.   Social Worker assessment / plan: CSW met with patient at bedside. Patient reported having significant stomach pain and was unable to participate fully in conversation. Patient did report he was previously in SNF at [facility]. CSW called patient's daughter, Samuel [name], and left voicemail requesting call back to discuss disposition planning. Initial plan was to discharge home, as patient did not have qualifying stay for Medicare to cover SNF, and was unable to pay privately. Patient now inpatient status. CSW sent out initial referrals and will follow for disposition planning.  Employment status:  Retired Forensic scientist:  Medicare PT Recommendations:  [place] / Referral to community resources:  [place]  Patient/Family's Response to care: Patient unable to express response to care due to significant discomfort and  lethargy.  Patient/Family's Understanding of and Emotional Response to Diagnosis, Current Treatment, and Prognosis:  Patient unable to express response to care due to significant discomfort and lethargy.  Emotional Assessment Appearance:  Appears stated age Attitude/Demeanor/Rapport:  Other(appropriate) Affect (typically observed):  Other(lethargic, uncomfortable) Orientation:  Oriented to Self, Oriented to Place, Oriented to  Time, Oriented to Situation Alcohol / Substance use:  Not Applicable Psych involvement (Current and /or in the community):  No (Comment)  Discharge Needs  Concerns to be addressed:  Discharge Planning Concerns, Care Coordination Readmission within the last 30 days:  No Current discharge risk:  Physical Impairment, Dependent with Mobility Barriers to Discharge:  Continued Medical Work up   Estanislado Emms, LCSW 03/06/2017, 4:24 PM

## 2017-03-16 ENCOUNTER — Inpatient Hospital Stay (HOSPITAL_COMMUNITY): Payer: Medicare Other | Admitting: Certified Registered Nurse Anesthetist

## 2017-03-16 ENCOUNTER — Encounter (HOSPITAL_COMMUNITY): Payer: Self-pay | Admitting: *Deleted

## 2017-03-16 ENCOUNTER — Inpatient Hospital Stay (HOSPITAL_COMMUNITY): Payer: Medicare Other

## 2017-03-16 ENCOUNTER — Encounter (HOSPITAL_COMMUNITY): Admission: EM | Disposition: E | Payer: Self-pay | Source: Home / Self Care | Attending: Internal Medicine

## 2017-03-16 DIAGNOSIS — J95821 Acute postprocedural respiratory failure: Secondary | ICD-10-CM

## 2017-03-16 DIAGNOSIS — R6521 Severe sepsis with septic shock: Secondary | ICD-10-CM

## 2017-03-16 DIAGNOSIS — R0609 Other forms of dyspnea: Secondary | ICD-10-CM

## 2017-03-16 DIAGNOSIS — K55029 Acute infarction of small intestine, extent unspecified: Secondary | ICD-10-CM

## 2017-03-16 DIAGNOSIS — K559 Vascular disorder of intestine, unspecified: Secondary | ICD-10-CM

## 2017-03-16 DIAGNOSIS — Z9889 Other specified postprocedural states: Secondary | ICD-10-CM

## 2017-03-16 DIAGNOSIS — A419 Sepsis, unspecified organism: Secondary | ICD-10-CM

## 2017-03-16 HISTORY — PX: LAPAROTOMY: SHX154

## 2017-03-16 LAB — BASIC METABOLIC PANEL
Anion gap: 11 (ref 5–15)
BUN: 21 mg/dL — AB (ref 6–20)
CHLORIDE: 100 mmol/L — AB (ref 101–111)
CO2: 26 mmol/L (ref 22–32)
CREATININE: 1.62 mg/dL — AB (ref 0.61–1.24)
Calcium: 8.4 mg/dL — ABNORMAL LOW (ref 8.9–10.3)
GFR calc Af Amer: 42 mL/min — ABNORMAL LOW (ref >60)
GFR, EST NON AFRICAN AMERICAN: 36 mL/min — AB (ref >60)
GLUCOSE: 148 mg/dL — AB (ref 65–99)
POTASSIUM: 3.9 mmol/L (ref 3.5–5.1)
SODIUM: 137 mmol/L (ref 135–145)

## 2017-03-16 LAB — CBC
HEMATOCRIT: 47.4 % (ref 39.0–52.0)
Hemoglobin: 16.7 g/dL (ref 13.0–17.0)
MCH: 32.7 pg (ref 26.0–34.0)
MCHC: 35.2 g/dL (ref 30.0–36.0)
MCV: 92.8 fL (ref 78.0–100.0)
PLATELETS: 144 10*3/uL — AB (ref 150–400)
RBC: 5.11 MIL/uL (ref 4.22–5.81)
RDW: 12.5 % (ref 11.5–15.5)
WBC: 6.9 10*3/uL (ref 4.0–10.5)

## 2017-03-16 LAB — TYPE AND SCREEN
ABO/RH(D): AB POS
Antibody Screen: NEGATIVE

## 2017-03-16 LAB — SURGICAL PCR SCREEN
MRSA, PCR: NEGATIVE
STAPHYLOCOCCUS AUREUS: NEGATIVE

## 2017-03-16 LAB — POCT I-STAT 3, ART BLOOD GAS (G3+)
Acid-base deficit: 13 mmol/L — ABNORMAL HIGH (ref 0.0–2.0)
BICARBONATE: 15.7 mmol/L — AB (ref 20.0–28.0)
O2 SAT: 89 %
PH ART: 7.183 — AB (ref 7.350–7.450)
PO2 ART: 65 mmHg — AB (ref 83.0–108.0)
Patient temperature: 96.4
TCO2: 17 mmol/L — AB (ref 22–32)
pCO2 arterial: 41.1 mmHg (ref 32.0–48.0)

## 2017-03-16 SURGERY — LAPAROTOMY, EXPLORATORY
Anesthesia: General

## 2017-03-16 MED ORDER — KETAMINE HCL-SODIUM CHLORIDE 100-0.9 MG/10ML-% IV SOSY
PREFILLED_SYRINGE | INTRAVENOUS | Status: AC
  Filled 2017-03-16: qty 10

## 2017-03-16 MED ORDER — FENTANYL BOLUS VIA INFUSION
25.0000 ug | INTRAVENOUS | Status: DC | PRN
  Filled 2017-03-16: qty 25

## 2017-03-16 MED ORDER — PROPOFOL 10 MG/ML IV BOLUS
INTRAVENOUS | Status: AC
  Filled 2017-03-16: qty 40

## 2017-03-16 MED ORDER — MIDAZOLAM HCL 2 MG/2ML IJ SOLN: 1.0000 mg | INTRAMUSCULAR | Status: DC | PRN

## 2017-03-16 MED ORDER — PIPERACILLIN-TAZOBACTAM 3.375 G IVPB
3.3750 g | Freq: Three times a day (TID) | INTRAVENOUS | Status: DC
  Administered 2017-03-16 – 2017-03-17 (×2): 3.375 g via INTRAVENOUS
  Filled 2017-03-16 (×5): qty 50

## 2017-03-16 MED ORDER — PIPERACILLIN-TAZOBACTAM 3.375 G IVPB
3.3750 g | Freq: Three times a day (TID) | INTRAVENOUS | Status: DC
  Filled 2017-03-16: qty 50

## 2017-03-16 MED ORDER — PROPOFOL 500 MG/50ML IV EMUL
INTRAVENOUS | Status: DC | PRN
  Administered 2017-03-16: 25 ug/kg/min via INTRAVENOUS

## 2017-03-16 MED ORDER — PROPOFOL 1000 MG/100ML IV EMUL: 5.0000 ug/kg/min | INTRAVENOUS | Status: DC

## 2017-03-16 MED ORDER — METOPROLOL TARTRATE 5 MG/5ML IV SOLN
INTRAVENOUS | Status: AC
  Administered 2017-03-16: 3 mg via INTRAVENOUS
  Filled 2017-03-16: qty 5

## 2017-03-16 MED ORDER — LACTATED RINGERS IV SOLN
INTRAVENOUS | Status: DC | PRN
  Administered 2017-03-16 (×2): via INTRAVENOUS

## 2017-03-16 MED ORDER — FENTANYL CITRATE (PF) 250 MCG/5ML IJ SOLN
INTRAMUSCULAR | Status: AC
  Filled 2017-03-16: qty 5

## 2017-03-16 MED ORDER — FENTANYL CITRATE (PF) 100 MCG/2ML IJ SOLN: 50.0000 ug | Freq: Once | INTRAMUSCULAR | Status: DC

## 2017-03-16 MED ORDER — SODIUM CHLORIDE 0.9 % IV BOLUS (SEPSIS)
1000.0000 mL | Freq: Once | INTRAVENOUS | Status: AC
  Administered 2017-03-16: 1000 mL via INTRAVENOUS

## 2017-03-16 MED ORDER — PIPERACILLIN-TAZOBACTAM 3.375 G IVPB 30 MIN
3.3750 g | Freq: Once | INTRAVENOUS | Status: AC
Start: 2017-03-16 — End: 2017-03-16
  Administered 2017-03-16: 3.375 g via INTRAVENOUS
  Filled 2017-03-16 (×2): qty 50

## 2017-03-16 MED ORDER — CHLORHEXIDINE GLUCONATE 0.12% ORAL RINSE (MEDLINE KIT)
15.0000 mL | Freq: Two times a day (BID) | OROMUCOSAL | Status: DC
  Administered 2017-03-16 – 2017-03-17 (×2): 15 mL via OROMUCOSAL

## 2017-03-16 MED ORDER — PHENYLEPHRINE HCL 10 MG/ML IJ SOLN
INTRAVENOUS | Status: DC | PRN
  Administered 2017-03-16: 25 ug/min via INTRAVENOUS

## 2017-03-16 MED ORDER — METOPROLOL TARTRATE 5 MG/5ML IV SOLN
3.0000 mg | INTRAVENOUS | Status: AC
  Administered 2017-03-16: 3 mg via INTRAVENOUS

## 2017-03-16 MED ORDER — ENOXAPARIN SODIUM 30 MG/0.3ML ~~LOC~~ SOLN
30.0000 mg | SUBCUTANEOUS | Status: DC
  Administered 2017-03-16: 30 mg via SUBCUTANEOUS
  Filled 2017-03-16: qty 0.3

## 2017-03-16 MED ORDER — ORAL CARE MOUTH RINSE
15.0000 mL | OROMUCOSAL | Status: DC
  Administered 2017-03-16 – 2017-03-17 (×6): 15 mL via OROMUCOSAL

## 2017-03-16 MED ORDER — ROCURONIUM BROMIDE 100 MG/10ML IV SOLN
INTRAVENOUS | Status: DC | PRN
  Administered 2017-03-16: 30 mg via INTRAVENOUS
  Administered 2017-03-16: 60 mg via INTRAVENOUS

## 2017-03-16 MED ORDER — FENTANYL CITRATE (PF) 100 MCG/2ML IJ SOLN: 50.0000 ug | INTRAMUSCULAR | Status: DC | PRN

## 2017-03-16 MED ORDER — LACTATED RINGERS IV SOLN
INTRAVENOUS | Status: DC | PRN
  Administered 2017-03-16: 12:00:00 via INTRAVENOUS

## 2017-03-16 MED ORDER — KETAMINE HCL 10 MG/ML IJ SOLN
INTRAMUSCULAR | Status: DC | PRN
  Administered 2017-03-16: 30 mg via INTRAVENOUS

## 2017-03-16 MED ORDER — SODIUM CHLORIDE 0.9 % IV SOLN
0.0000 ug/min | INTRAVENOUS | Status: DC
  Administered 2017-03-16: 20 ug/min via INTRAVENOUS
  Administered 2017-03-17: 300 ug/min via INTRAVENOUS
  Filled 2017-03-16 (×5): qty 1
  Filled 2017-03-16: qty 10

## 2017-03-16 MED ORDER — NOREPINEPHRINE BITARTRATE 1 MG/ML IV SOLN
0.0000 ug/min | INTRAVENOUS | Status: DC
  Administered 2017-03-16: 16 ug/min via INTRAVENOUS
  Administered 2017-03-17 (×2): 40 ug/min via INTRAVENOUS
  Administered 2017-03-17: 26 ug/min via INTRAVENOUS
  Filled 2017-03-16 (×7): qty 4

## 2017-03-16 MED ORDER — PROPOFOL 10 MG/ML IV BOLUS
INTRAVENOUS | Status: DC | PRN
  Administered 2017-03-16: 30 mg via INTRAVENOUS

## 2017-03-16 MED ORDER — PHENYLEPHRINE HCL 10 MG/ML IJ SOLN
INTRAMUSCULAR | Status: DC | PRN
  Administered 2017-03-16 (×3): 120 ug via INTRAVENOUS

## 2017-03-16 MED ORDER — 0.9 % SODIUM CHLORIDE (POUR BTL) OPTIME
TOPICAL | Status: DC | PRN
  Administered 2017-03-16: 1000 mL

## 2017-03-16 MED ORDER — NOREPINEPHRINE BITARTRATE 1 MG/ML IV SOLN
0.0000 ug/min | INTRAVENOUS | Status: DC
  Administered 2017-03-16: 4 ug/min via INTRAVENOUS
  Filled 2017-03-16: qty 4

## 2017-03-16 MED ORDER — MIDAZOLAM HCL 2 MG/2ML IJ SOLN
INTRAMUSCULAR | Status: AC
  Filled 2017-03-16: qty 2

## 2017-03-16 MED ORDER — FENTANYL 2500MCG IN NS 250ML (10MCG/ML) PREMIX INFUSION
25.0000 ug/h | INTRAVENOUS | Status: DC
  Administered 2017-03-16: 25 ug/h via INTRAVENOUS
  Filled 2017-03-16: qty 250

## 2017-03-16 SURGICAL SUPPLY — 47 items
BLADE CLIPPER SURG (BLADE) IMPLANT
CANISTER SUCT 3000ML PPV (MISCELLANEOUS) ×3 IMPLANT
CHLORAPREP W/TINT 26ML (MISCELLANEOUS) ×3 IMPLANT
COVER SURGICAL LIGHT HANDLE (MISCELLANEOUS) ×3 IMPLANT
DRAPE LAPAROSCOPIC ABDOMINAL (DRAPES) ×3 IMPLANT
DRAPE UTILITY XL STRL (DRAPES) ×6 IMPLANT
DRAPE WARM FLUID 44X44 (DRAPE) ×3 IMPLANT
DRSG MEPILEX BORDER 4X12 (GAUZE/BANDAGES/DRESSINGS) IMPLANT
DRSG MEPILEX BORDER 4X8 (GAUZE/BANDAGES/DRESSINGS) IMPLANT
DRSG OPSITE POSTOP 4X10 (GAUZE/BANDAGES/DRESSINGS) IMPLANT
DRSG OPSITE POSTOP 4X8 (GAUZE/BANDAGES/DRESSINGS) IMPLANT
ELECT BLADE 6.5 EXT (BLADE) IMPLANT
ELECT CAUTERY BLADE 6.4 (BLADE) ×6 IMPLANT
ELECT REM PT RETURN 9FT ADLT (ELECTROSURGICAL) ×3
ELECTRODE REM PT RTRN 9FT ADLT (ELECTROSURGICAL) ×1 IMPLANT
GAUZE SPONGE 4X4 12PLY STRL (GAUZE/BANDAGES/DRESSINGS) ×3 IMPLANT
GLOVE SURG SIGNA 7.5 PF LTX (GLOVE) ×6 IMPLANT
GOWN STRL REUS W/ TWL LRG LVL3 (GOWN DISPOSABLE) ×2 IMPLANT
GOWN STRL REUS W/ TWL XL LVL3 (GOWN DISPOSABLE) ×1 IMPLANT
GOWN STRL REUS W/TWL LRG LVL3 (GOWN DISPOSABLE) ×6
GOWN STRL REUS W/TWL XL LVL3 (GOWN DISPOSABLE) ×3
KIT BASIN OR (CUSTOM PROCEDURE TRAY) ×3 IMPLANT
KIT ROOM TURNOVER OR (KITS) ×3 IMPLANT
LIGASURE IMPACT 36 18CM CVD LR (INSTRUMENTS) ×5 IMPLANT
NS IRRIG 1000ML POUR BTL (IV SOLUTION) ×6 IMPLANT
PACK GENERAL/GYN (CUSTOM PROCEDURE TRAY) ×3 IMPLANT
PAD ARMBOARD 7.5X6 YLW CONV (MISCELLANEOUS) ×3 IMPLANT
RELOAD PROXIMATE 75MM BLUE (ENDOMECHANICALS) ×6 IMPLANT
RELOAD STAPLE 75 3.8 BLU REG (ENDOMECHANICALS) IMPLANT
SPECIMEN JAR LARGE (MISCELLANEOUS) ×3 IMPLANT
SPONGE LAP 18X18 X RAY DECT (DISPOSABLE) IMPLANT
STAPLER PROXIMATE 75MM BLUE (STAPLE) ×2 IMPLANT
STAPLER VISISTAT 35W (STAPLE) ×3 IMPLANT
SUCTION POOLE TIP (SUCTIONS) ×3 IMPLANT
SUT PDS AB 1 TP1 96 (SUTURE) ×6 IMPLANT
SUT SILK 2 0 SH CR/8 (SUTURE) ×3 IMPLANT
SUT SILK 2 0 TIES 10X30 (SUTURE) ×3 IMPLANT
SUT SILK 3 0 SH CR/8 (SUTURE) ×3 IMPLANT
SUT SILK 3 0 TIES 10X30 (SUTURE) ×3 IMPLANT
SUT VIC AB 3-0 SH 18 (SUTURE) IMPLANT
TAPE CLOTH SURG 4X10 WHT LF (GAUZE/BANDAGES/DRESSINGS) ×2 IMPLANT
TOWEL OR 17X24 6PK STRL BLUE (TOWEL DISPOSABLE) ×3 IMPLANT
TOWEL OR 17X26 10 PK STRL BLUE (TOWEL DISPOSABLE) ×3 IMPLANT
TRAY FOLEY W/METER SILVER 14FR (SET/KITS/TRAYS/PACK) IMPLANT
TRAY FOLEY W/METER SILVER 16FR (SET/KITS/TRAYS/PACK) IMPLANT
WATER STERILE IRR 1000ML POUR (IV SOLUTION) IMPLANT
YANKAUER SUCT BULB TIP NO VENT (SUCTIONS) ×3 IMPLANT

## 2017-03-16 NOTE — Progress Notes (Signed)
CCM Progress Note  I had an extensive discussion with patient family (sister x 2, nephew and niece). Family was advised on the extensive naturae of the critical illness which includes the extent of the bowel infarct which makes this not amenable to surgical repair. Family asked about colostomy but are now aware that patient is not a candidate for that due how extensive the ischemia extendeds. Family also noticed the blood pressure readings on the arterial line significantly drop. It was explained to them as more of the intestines "die off" patient will become more acidotic causing more organ failure causing his BP to continue dropping. I advised them to call in all family members now because the likelihood of patient surviving the night is very low.  I have also explained to them the extent of his chronic disease which they did not seem to be too aware off which includes CAD, COPD, CHF, HTN, AS with TAVR. On further discussion it seemed the patient lived alone at home and was self medicating himself with ETOH for chronic hip pain. This might have caused some gastric damage but the current ischemia is most likely due to him "showering clots" from his heart.   Family would like for CC team to continue current management which includes vent, pressors, pain and pain control.    Samuel [name] Samuel [name] D.O [facility] Pager: [phone]

## 2017-03-16 NOTE — Progress Notes (Signed)
Patient ID: Samuel [name], male   DOB: 1926-06-02, 83 y.o.   MRN: [id number]   The patient's CAT scan is now vastly different from his previous scan.  There is pneumatosis, free air, portal venous gas.  This is more consistent with extensive ischemic event.  Emergent expiratory laparotomy is recommended.  I discussed the risk with the patient.  These include but are not limited to bleeding, infection, injury to surrounding structures, need for bowel resections and an ostomy, prolonged intubation, cardiopulmonary issues, and even death given his overall medical status.  He agrees to proceed with emergent surgery

## 2017-03-16 NOTE — Anesthesia Preprocedure Evaluation (Addendum)
Anesthesia Evaluation  Patient identified by MRN, date of birth, ID band Patient awake    Reviewed: Allergy & Precautions, H&P , Patient's Chart, lab work & pertinent test results, reviewed documented beta blocker date and time   Airway Mallampati: II  TM Distance: >3 FB Neck ROM: full    Dental no notable dental hx.    Pulmonary former smoker,    Pulmonary exam normal breath sounds clear to auscultation       Cardiovascular hypertension,  Rhythm:regular Rate:Normal     Neuro/Psych    GI/Hepatic   Endo/Other    Renal/GU      Musculoskeletal   Abdominal   Peds  Hematology   Anesthesia Other Findings Depression    COPD Essential hypertension, benign     Alcohol abuse    Shortness of breath   Scoliosis/kyphoscoliosis   Complication of anesthesia   NO NECK LINES ON RIGHT FOR SURGERY GERD    Abdominal aortic aneurysm (HCC) 11/21/2016 5.2 x 4.6  CHF     S/P TAVR (transcatheter aortic valve replacement) 01/21/2013 29 mm Edwards Sapien XT transcatheter heart valve placed via direct aortic approach through a right anterior mini-thoracotomy Perivalvular leak of prosthetic heart valve LVH,   EF 55-60%, aortic valve with mild to moderate perivalvular leak, AI, mean aortic valve gradient 6 mm of mercury   Mitral valve: Severely calcified annulus. The findings are   consistent with moderate to severe stenosis. Valve area by   pressure half-time: 1.32 cm^2.     Reproductive/Obstetrics                            Anesthesia Physical Anesthesia Plan  ASA: III  Anesthesia Plan: General   Post-op Pain Management:    Induction: Intravenous  PONV Risk Score and Plan: 2 and Treatment may vary due to age or medical condition and Ondansetron  Airway Management Planned: Oral ETT  Additional Equipment: Arterial line, CVP and Ultrasound Guidance Line Placement  Intra-op Plan:   Post-operative  Plan: Possible Post-op intubation/ventilation  Informed Consent: I have reviewed the patients History and Physical, chart, labs and discussed the procedure including the risks, benefits and alternatives for the proposed anesthesia with the patient or authorized representative who has indicated his/her understanding and acceptance.   Dental Advisory Given  Plan Discussed with: CRNA and Surgeon  Anesthesia Plan Comments: (  )       Anesthesia Quick Evaluation

## 2017-03-16 NOTE — Progress Notes (Signed)
Progress Note  Patient Name: Samuel [name] Date of Encounter: 03/22/2017  Primary Cardiologist: Olga MillersBrian Crenshaw, MD   Subjective   Weak and mumbling no cardiac complaints   Inpatient Medications    Scheduled Meds: . amLODipine  7.5 mg Oral Daily  . aspirin  81 mg Oral Daily  . aspirin EC  325 mg Oral Daily  . cholecalciferol  5,000 Units Oral Daily  . enoxaparin (LOVENOX) injection  40 mg Subcutaneous Q24H  . famotidine  20 mg Oral Daily  . folic acid  1 mg Oral Daily  . LORazepam  0-4 mg Intravenous Q12H  . magnesium oxide  200 mg Oral BID  . metoprolol succinate  25 mg Oral Daily  . multivitamin with minerals  1 tablet Oral Daily  . selenium  100 mcg Oral Daily  . sodium chloride flush  3 mL Intravenous Q12H  . thiamine  100 mg Oral Daily   Or  . thiamine  100 mg Intravenous Daily  . vitamin B-12  50 mcg Oral Daily   Continuous Infusions: . sodium chloride    . piperacillin-tazobactam    . piperacillin-tazobactam (ZOSYN)  IV     PRN Meds: sodium chloride, acetaminophen, albuterol, ALPRAZolam, dextromethorphan-guaiFENesin, hydrALAZINE, morphine injection, MUSCLE RUB, ondansetron (ZOFRAN) IV, sodium chloride flush, traMADol, zolpidem   Vital Signs    Vitals:   2016/07/29 2303 03/14/2017 0000 03/22/2017 0400 03/30/2017 0500  BP:    126/62  Pulse:  (!) 112 (!) 108 (!) 111  Resp:    (!) 24  Temp:    (!) 97.5 F (36.4 C)  TempSrc:    Oral  SpO2: 94%   93%  Weight:    142 lb 10.2 oz (64.7 kg)  Height:        Intake/Output Summary (Last 24 hours) at 03/14/2017 0836 Last data filed at 04/02/2017 0400 Gross per 24 hour  Intake 820 ml  Output 1250 ml  Net -430 ml   Filed Weights   03/14/17 0315 2016/07/29 0423 03/29/2017 0500  Weight: 143 lb 3.2 oz (65 kg) 141 lb 1.6 oz (64 kg) 142 lb 10.2 oz (64.7 kg)    Telemetry    NSR 03/12/2017  - Personally Reviewed  ECG    NSR normal ECG - Personally Reviewed  Physical Exam  BP 126/62 (BP Location: Left Arm)   Pulse  (!) 111   Temp (!) 97.5 F (36.4 C) (Oral)   Resp (!) 24   Ht 5\' 8"  (1.727 m)   Wt 142 lb 10.2 oz (64.7 kg)   SpO2 93%   BMI 21.69 kg/m  [place]  Disheveled white male  HEENT: normal Neck supple with no adenopathy JVP normal no bruits no thyromegaly Lungs clear with no wheezing and good diaphragmatic motion Heart:  S1/S2 SEM through TAVR valve murmur, no rub, gallop or click PMI normal Abdomen: benighn, BS positve, no tenderness AAA palpable not tender  no bruit.  No HSM or HJR Distal pulses intact with no bruits No edema Neuro non-focal Skin warm and dry RFA cath sight A   Labs    Chemistry Recent Labs  Lab 03/09/2017 0308 03/14/17 0547 03/19/2017 0344  NA 130* 134* 137  K 4.2 4.1 3.9  CL 91* 94* 100*  CO2 25 32 26  GLUCOSE 114* 104* 148*  BUN 18 18 21*  CREATININE 1.39* 1.22 1.62*  CALCIUM 8.4* 8.2* 8.4*  PROT 7.4  --   --   ALBUMIN 3.4*  --   --  AST 43*  --   --   ALT 13*  --   --   ALKPHOS 91  --   --   BILITOT 0.9  --   --   GFRNONAA 44* 51* 36*  GFRAA 51* 59* 42*  ANIONGAP 14 8 11      Hematology Recent Labs  Lab 03/24/2017 0308 July 02, 2016 2122 03/27/2017 0344  WBC 9.2 10.4 6.9  RBC 4.57 4.78 5.11  HGB 14.2 15.3 16.7  HCT 40.5 43.9 47.4  MCV 88.6 91.8 92.8  MCH 31.1 32.0 32.7  MCHC 35.1 34.9 35.2  RDW 12.2 12.3 12.5  PLT 170 132* 144*    Cardiac Enzymes Recent Labs  Lab 04/02/2017 0512 03/09/2017 1024 03/24/2017 1610  TROPONINI <0.03 <0.03 <0.03    Recent Labs  Lab 03/24/2017 0314  TROPIPOC 0.01     BNP Recent Labs  Lab 03/22/2017 0512  BNP 282.1*     DDimer  Recent Labs  Lab 03/24/2017 0308  DDIMER 2.54*     Radiology    Nm Myocar Multi W/spect W/wall Motion / Ef  Result Date: 03/14/2017  There was no ST segment deviation noted during stress.  No T wave inversion was noted during stress.  Defect 1: There is a large defect of moderate severity present in the basal inferior, basal inferolateral, basal anterolateral, mid inferior  and mid inferolateral location.  Findings consistent with ischemia.  This is an intermediate risk study.  The left ventricular ejection fraction is normal (55-65%).  There is an artifact, however there is a large reversible defect in the basal and mid inferior, inferolateral and basel anterolateral walls suspicious for ischemia in the LCX territory.    Dg Abd Portable 2v  Result Date: 14-May-2016 CLINICAL DATA:  Nausea and vomiting. Cardiac catheterization earlier today. EXAM: PORTABLE ABDOMEN - 2 VIEW COMPARISON:  CT 03/10/2017 FINDINGS: The free intraperitoneal air observed on CT is not conclusively visible on these radiographs. There is a large volume colonic stool and air. Enteric contrast is present throughout many of the left hemicolon diverticula, from the recent CT. Generous volume of air throughout nonobstructed small bowel. IMPRESSION: No evidence of bowel obstruction. The free intraperitoneal air observed on CT is not conclusively visible on these radiographic images. Electronically Signed   By: Ellery Plunkaniel R Mitchell M.D.   On: 14-May-2016 19:13    Cardiac Studies   Echo EF normal grade 2 diastolic TAVR valve with mean gradient 9 mmHg And mild AR   Patient Profile     Samuel [name] is a 83 y.o. male with a [facility] of [facility]ob[facility]ive [facility], severe aortic stenosis s/p TAVR 01/21/2013, infrarenal abdominal aortic saccular aneurysm measuring 5.1 x 4 cm by CTA 09/2015, diastolic dysfunction, CKD stage II,  HTN, EtOH abuse, COPD 2/2 prior tobacco abuse, chronic low back pain, and GERD who is being seen today for the evaluation of chest pain and SOB at the request of Dr. Clyde LundborgNiu.    Assessment & Plan    1. Chest pain cath yesterday with no CAD r/o no further evaluation   2. Diastolic dysfunction: grade 2 on echo this admission  -He does not appear volume overloaded Cr better hold lasix home dose today as well He looks dry EDP low at cath   3. Severe aortic stenosis s/p AVR: mean  gradient stable 9 mmHg mild AR   4. Abnormal CT/possible free air noted in the abdomen: - CT diverticulosis  Not acting like acute abdomen  5. Acute on CKD stage II:Cr 1.6 stable post cath    6. Alcohol abuse: -CIWA protocol -Check magnesium with recommendation to replete to goal of > 2.0  7. AAA: -CTA abdomen stable 5.3 by CT f/u VVS   9. Hypertensive heart disease: -Amlodipine and Toprol -Titrate as needed   Ok to d/c home from cardiology perspective but no sure given his ETOH abuse That he can care for himself   For questions or updates, please contact [facility] Please consult [email or web address] for contact info under Cardiology/STEMI.      Signed, Charlton Haws, MD  2017/03/31, 8:36 AM

## 2017-03-16 NOTE — Consult Note (Signed)
PULMONARY / CRITICAL CARE MEDICINE   Name: Samuel [name] MRN: [id number] DOB: [birth year]    ADMISSION DATE:  2016/06/24 CONSULTATION DATE:  03/08/2017  REFERRING MD:  Dr. Magnus IvanBlackman  CHIEF COMPLAINT:  Bowel ischemia  HISTORY OF PRESENT ILLNESS:  83 y/o M with PMH as below who [facility] with complaints of shortness of breath and left sided chest pain.  Per chart review, the patient reported the pain began approximately 1.5 hour prior to arrival. The patient reported he may have drank too much the evening prior.    Initial work up included a CTA of the chest which was negative for PE but showed scattered free air in the upper abdomen worrisome for bowel perforation.  Initial labs showed a negative troponin x2, D-Dimer 2.54, BNP 282, Na 130, K 4.2, BUN 18/Sr Cr 1.39 (baseline 1-1.2), glucose 114, albumin 3.4 and small amount of hgb in the urine.  He was given a NTG with subsequent hypotension requiring a NS bolus.  After morphine and zofran, he was pain free.  He was evaluated by Cardiology and recommended for a Lexiscan Myoview on the am of 12/12.  [facility]  Free air findings were discussed with CCS and a CT abdomen/pelvis was recommended.  Per notes, the patient denied abdominal pain and his exam was benign.  CT of the abdomen did not show any evidence of active bowel pathology and questioned if the free air was secondary to a perforated diverticulum.  This was observed as the patient was without abdominal pain, tolerating a diet and having BM's.  The nuclear medicine testing was completed on 12/12 and found to be abnormal with concern for inferolateral ischemia.  He was taken to the cath lab on 12/13 which showed essentially normal coronary arteries, 30-40% mild LAD lesion.  It was felt myoview was a false positive and chest pain was non-cardiac.   Post cardiac cath, the patient was eating lunch and developed increased abdominal pain, nausea & vomiting.   Given new symtoms & free air a follow up abdominal xray did not reveal any new findings.  Overnight 12/14, he developed worsening abdominal pain, increasing shortness of breath and desaturations. CCS was consulted 12/14 for evaluation.  STAT CT of abdomen which showed free intraperitoneal air, portal venous gas and concerns for small bowel ischemia.  He was taken to the OR emergently and found to have extensive bowel ischemia that was felt to be not survivable.  He was closed and returned to ICU on mechanical ventilation.   PCCM consulted for evaluation.    PAST MEDICAL HISTORY :  He  has a past medical history of Abdominal aortic aneurysm (HCC) (11/21/2016), Alcohol abuse, Arthritis, CHF (congestive heart failure) (HCC), Chronic lower back pain, Complication of anesthesia, COPD (chronic obstructive pulmonary disease) (HCC), Depression, DIZZINESS, CHRONIC, Essential hypertension, benign, GERD (gastroesophageal reflux disease), Perivalvular leak of prosthetic heart valve, S/P TAVR (transcatheter aortic valve replacement) (01/21/2013), Scoliosis/kyphoscoliosis (10/28/2011), and Shortness of breath (10/24/11).  PAST SURGICAL HISTORY: He  has a past surgical history that includes No past surgeries; Transcatheter aortic valve replacement, transaortic (N/A, 01/21/2013); Intraoprative transesophageal echocardiogram (N/A, 01/21/2013); Thoracotomy (Right, 01/21/2013); left and right heart catheterization with coronary angiogram (N/A, 10/27/2011); and LEFT HEART CATH AND CORONARY ANGIOGRAPHY (N/A, 03/09/2017).  No Known Allergies  No current facility-administered medications on file prior to encounter.    Current Outpatient Medications on File Prior to Encounter  Medication Sig  . ALPRAZolam Prudy Feeler(XANAX)  0.5 MG tablet Take 1 tablet (0.5 mg total) 2 (two) times daily as needed by mouth. for anxiety (Patient taking differently: Take 0.25 mg by mouth 2 (two) times daily as needed (breathing). for anxiety)  .  amLODipine (NORVASC) 5 MG tablet Take 1.5 tablets (7.5 mg total) by mouth daily. (Patient taking differently: Take 5 mg by mouth daily. )  . Bioflavonoid Products (ESTER C PO) Take 1,000 mg by mouth daily.  . Cholecalciferol (VITAMIN D-3) 5000 UNITS TABS Take 1 capsule by mouth daily.   . furosemide (LASIX) 20 MG tablet Take 2 tablets (40 mg total) by mouth daily. (Patient taking differently: Take 20 mg by mouth daily. )  . metoprolol succinate (TOPROL-XL) 25 MG 24 hr tablet TAKE 1/2 TABLET BY MOUTH DAILY FOR BLOOD PRESSURE  . Multiple Vitamin (MULTIVITAMIN WITH MINERALS) TABS Take 1 tablet by mouth daily.  . ondansetron (ZOFRAN-ODT) 4 MG disintegrating tablet Take 1 tablet (4 mg total) by mouth every 8 (eight) hours as needed for nausea or vomiting.  Marland Kitchen. POTASSIUM GLUCONATE ER PO Take 1 tablet by mouth daily as needed (when taking furosemide). OTC  . SELENIUM PO Take 1 tablet by mouth daily.  Marland Kitchen. Specialty Vitamins Products (MAGNESIUM, AMINO ACID CHELATE,) 133 MG tablet Take 1 tablet by mouth daily as needed (when taking furosemide).   . traMADol (ULTRAM) 50 MG tablet Take 1 tablet (50 mg total) 2 (two) times daily as needed by mouth. (Patient taking differently: Take 50 mg by mouth every 12 (twelve) hours as needed for moderate pain. )  . vitamin B-12 (CYANOCOBALAMIN) 100 MCG tablet Take 50 mcg by mouth daily.      FAMILY HISTORY:  His indicated that his mother is deceased. He indicated that his father is deceased. He indicated that one of his three sisters is deceased. He indicated that two of his five brothers are deceased. He indicated that the status of his unknown relative is unknown.   SOCIAL HISTORY: He  reports that he quit smoking about 64 years ago. His smoking use included cigarettes. He quit after 2.50 years of use. he has never used smokeless tobacco. He reports that he drinks about 1.8 - 3.6 oz of alcohol per week. He reports that he does not use drugs.  REVIEW OF SYSTEMS:    unable  SUBJECTIVE:    VITAL SIGNS: BP (!) 64/45 Comment: Levo adjusted  Pulse 82   Temp (!) 96.4 F (35.8 C) (Axillary)   Resp (!) 37   Ht 5\' 8"  (1.727 m)   Wt 64.7 kg (142 lb 10.2 oz)   SpO2 92%   BMI 21.69 kg/m   HEMODYNAMICS:    VENTILATOR SETTINGS: Vent Mode: PRVC FiO2 (%):  [50 %] 50 % Set Rate:  [18 bmp] 18 bmp Vt Set:  [550 mL] 550 mL PEEP:  [5 cmH20] 5 cmH20 Plateau Pressure:  [17 cmH20] 17 cmH20  INTAKE / OUTPUT: I/O last 3 completed shifts: In: 1020 [P.O.:620; I.V.:400] Out: 1250 [Urine:1100; Emesis/NG output:150]  PHYSICAL EXAMINATION: General:  Frail elderly male on vent Neuro:  Sedated RASS -3 HEENT:  [place]/AT, PERRL, no JVD Cardiovascular:  RRR, no MRG Lungs:  Clear bilateral breath sounds. R>L Abdomen:  Soft, non-distended, surgical incision closed.  Musculoskeletal:  No acute deformity Skin:  Grossly intact.   LABS:  BMET Recent Labs  Lab 03/12/2017 0308 03/14/17 0547 03/10/2017 0344  NA 130* 134* 137  K 4.2 4.1 3.9  CL 91* 94* 100*  CO2 25 32 26  BUN 18 18 21*  CREATININE 1.39* 1.22 1.62*  GLUCOSE 114* 104* 148*    Electrolytes Recent Labs  Lab 2017/04/10 0308 2017-04-10 1024 03/14/17 0547 03/25/2017 0344  CALCIUM 8.4*  --  8.2* 8.4*  MG  --  1.9 2.0  --     CBC Recent Labs  Lab April 10, 2017 0308 03/13/2017 2122 03/26/2017 0344  WBC 9.2 10.4 6.9  HGB 14.2 15.3 16.7  HCT 40.5 43.9 47.4  PLT 170 132* 144*    Coag's Recent Labs  Lab 03/30/2017 0938  INR 1.09    Sepsis Markers No results for input(s): LATICACIDVEN, PROCALCITON, O2SATVEN in the last 168 hours.  ABG No results for input(s): PHART, PCO2ART, PO2ART in the last 168 hours.  Liver Enzymes Recent Labs  Lab 04/10/17 0308  AST 43*  ALT 13*  ALKPHOS 91  BILITOT 0.9  ALBUMIN 3.4*    Cardiac Enzymes Recent Labs  Lab 2017/04/10 0512 04/10/2017 1024 04-10-2017 1610  TROPONINI <0.03 <0.03 <0.03    Glucose No results for input(s): GLUCAP in the last 168  hours.  Imaging Ct Abdomen Pelvis Wo Contrast  Result Date: 03/11/2017 CLINICAL DATA:  Perforation suspected.  Peritonitis.  Follow-up. EXAM: CT ABDOMEN AND PELVIS WITHOUT CONTRAST TECHNIQUE: Multidetector CT imaging of the abdomen and pelvis was performed following the standard protocol without IV contrast. COMPARISON:  Plain films of 04/02/2017.  Prior CT of 2017-04-10. FINDINGS: Lower chest: Right greater than left base airspace disease is progressive. Prior aortic valve repair. Normal heart size. Trace right pleural fluid. Hepatobiliary: Development of extensive pneumobilia. No focal liver lesion. Gallbladder distension at 11.4 cm. No calcified stone. No biliary duct dilatation. Pancreas: Pancreatic atrophy, without acute inflammation. Spleen: Normal in size, without focal abnormality. Adrenals/Urinary Tract: Normal adrenal glands. Bilateral renal cortical thinning. Right renal low-density lesions are likely cysts. No hydronephrosis. Bladder wall thickening with small diverticula. Stomach/Bowel: Normal stomach, without wall thickening. Extensive colonic diverticulosis. Normal colon and terminal ileum. Extensive right-sided small bowel pneumatosis, including on image 53 and 45/series 3. Vascular/Lymphatic: Advanced aortic and branch vessel atherosclerosis. Infrarenal abdominal aortic aneurysm again identified at on the order of 5.5 x 4.4 cm, similar. Air identified within the SMV, splenic vein, and portal vein. No abdominopelvic adenopathy. Reproductive: Mild prostatomegaly. Other: Small volume perihepatic ascites. Small volume right upper quadrant free intraperitoneal air. Musculoskeletal: Degenerate changes of both hips. Lumbar spine convex left curvature. Advanced lumbosacral spondylosis. IMPRESSION: 1. Constellation of findings, including small bowel pneumatosis, free intraperitoneal air, and portal venous gas. Most consistent with bowel ischemia. 2. Small volume abdominal ascites, also likely  secondary. 3. Similar abdominal aortic aneurysm. 4. Worsened bibasilar aeration. Most likely atelectasis. Early aspiration or pneumonia cannot be excluded. 5. Prostatomegaly with a component of bladder outlet obstruction. 6.  Aortic Atherosclerosis (ICD10-I70.0). These results were called by telephone at the time of interpretation on 03/28/2017 at 9:43 am to [facility] who verbally acknowledged these results. Electronically Signed   By: Jeronimo Greaves M.D.   On: 03/10/2017 09:46   Dg Chest Port 1 View  Result Date: 03/16/2017 CLINICAL DATA:  Respiratory distress EXAM: PORTABLE CHEST 1 VIEW COMPARISON:  04-10-17 FINDINGS: Cardiac shadow is stable. Prior TAVR is seen and stable. Postsurgical changes in the anterior chest wall are again seen. Severe scoliosis concave to the left is noted. No acute bony abnormality is seen. Some mild right basilar atelectatic changes are noted. IMPRESSION: Chronic changes similar to that seen on the prior exam. New mild right basilar atelectasis. Electronically  Signed   By: Alcide Clever M.D.   On: March 24, 2017 09:48   Dg Abd Portable 2v  Result Date: 24-Mar-2017 CLINICAL DATA:  Right-sided abdominal pain. Nausea. Back and hip pain. History of AAA. EXAM: PORTABLE ABDOMEN - 2 VIEW COMPARISON:  CT 03/24/17 abdominal series 1213 2018 . FINDINGS: Distended loops of small and large bowel noted. Reference is made to prior CT report of Mar 24, 2017 with reveals extensive pneumatosis and air within the portal venous system as well as free intraperitoneal air. Portal venous air is again noted. Free intraperitoneal air again noted. Oral contrast noted in colonic diverticuli. Aortic and visceral vascular calcifications again noted. Lumbar spine scoliosis and degenerative change. Degenerative changes both hips. IMPRESSION: 1. Distended loops of small and large bowel noted. Reference is made to prior CT report Mar 24, 2017 which reveals extensive pneumatosis and air within the portal  venous system as well as free intraperitoneal air. Portal venous air is again noted. Free intraperitoneal air is again noted . Again bowel ischemia with perforation could present in this fashion . 2. Aortic and visceral vascular disease again noted. Electronically Signed   By: Maisie Fus  Register   On: 24-Mar-2017 10:29   Dg Abd Portable 2v  Result Date: 03/26/2017 CLINICAL DATA:  Nausea and vomiting. Cardiac catheterization earlier today. EXAM: PORTABLE ABDOMEN - 2 VIEW COMPARISON:  CT 03/16/2017 FINDINGS: The free intraperitoneal air observed on CT is not conclusively visible on these radiographs. There is a large volume colonic stool and air. Enteric contrast is present throughout many of the left hemicolon diverticula, from the recent CT. Generous volume of air throughout nonobstructed small bowel. IMPRESSION: No evidence of bowel obstruction. The free intraperitoneal air observed on CT is not conclusively visible on these radiographic images. Electronically Signed   By: Ellery Plunk M.D.   On: 03/20/2017 19:13     STUDIES:  CTA chest/abd/pelvis 12/11 > No evidence of pulmonary embolus. Scattered free air noted at the upper abdomen, concerning for bowel perforation. The location of perforation is not characterized on this study. CT of the abdomen and pelvis is recommended for further evaluation. 3. Diffuse wall thickening along the course of the esophagus may reflect chronic inflammation or esophagitis. Left heart cath 12/13 > non-obstructive CAD.  CT abdomen/pelvis 12/14 > Constellation of findings, including small bowel pneumatosis, free intraperitoneal air, and portal venous gas. Most consistent with bowel ischemia. Small volume abdominal ascites, also likely secondary. Similar abdominal aortic aneurysm. Worsened bibasilar aeration. Most likely atelectasis. Early aspiration or pneumonia cannot be excluded. Prostatomegaly with a component of bladder outlet  obstruction.  CULTURES:   ANTIBIOTICS: Zosyn 12/14 >  SIGNIFICANT EVENTS: 12/11 admit for CP and SOB  LINES/TUBES: ETT 12/14 >  DISCUSSION:   ASSESSMENT / PLAN:  PULMONARY A: Acute respiratory failure secondary to post-op setting Aspiration PNA COPD without exacerbation  P:   Full vent support CXR ABG VAP bundle Scheduled duonebs  CARDIOVASCULAR A:  Shock, post op.  Chronic diastolic CHF (grade 2) Non obstructive CAD (lesions 35-40%) S/p TAVR 2014 AAA  P:  ICU hemodynamic monitoring Levophed infusion titrated to MAP 65 Will continue aggressive measures and discuss GOC with family as bowel insult not survivable per CCS. Ensure lactic acid clearing  RENAL A:   CKD II AKI  P:  IVF maintenance fluid  GASTROINTESTINAL A:   Ischemic bowel to significant portions of both large and small bowel  P:   Non-survivable per surgeon NPO OGT to LIS  HEMATOLOGIC A:   No  acute issues   P:  Follow CBC  INFECTIOUS A:   No acute issues  P:     ENDOCRINE A:   No acute issues  P:     NEUROLOGIC A:   Acute metabolic encephalopathy  P:   RASS goal: 0 Fentanyl for analgesia   FAMILY  - Updates: Family updated by Dr. Vassie Loll 12/14 upon arrival to ICU.  - Inter-disciplinary family meet or Palliative Care meeting due by:  12/21   Joneen Roach, AGACNP-BC [facility] Pager [phone] or [phone]  03/21/2017 2:58 PM

## 2017-03-16 NOTE — Progress Notes (Signed)
Call to speak to pt's sister, [name]. States she is next of kin, sent message to attending to give sister a call. # [phone]      Isidoro DonningAlesia Dontai Pember RN CCM Case Mgmt phone [phone]

## 2017-03-16 NOTE — Care Management Note (Signed)
Case Management Note  Patient Details  Name: Lafayette DragonJoseph A Cherry MRN: [id number] Date of Birth: 03-05-1927  Subjective/Objective:     Dyspnea, COPD exac, CKD, ETOH               Action/Plan: Discharge Planning: 03/14/2017 NCM spoke to pt and states he lives at home alone. States his dtr, Dewayne Hatchnn will assist as needed. [facility] Pt states [facility] Pt reports having RW, neb machine and wheelchair (broken) at home. States he is working on getting his wheelchair fixed.   03/27/2017 PT recommended SNF. CSW following for SNF placement. Will notify [facility] rep of pt dc to SNF when medically stable.    Expected Discharge Date:            Expected Discharge Plan:  Skilled Nursing Facility  In-House Referral:  Clinical Social Work  Discharge planning Services  CM Consult  Post Acute Care Choice:  Home Health Choice offered to:  Patient  DME Arranged:  N/A DME Agency:  NA  HH Arranged:  RN, PT HH Agency:  [facility]  Status of Service:  Completed, signed off  If discussed at Long Length of Stay Meetings, dates discussed:    Additional Comments:  Elliot CousinShavis, Patricio Popwell Ellen, RN 03/16/2017, 10:07 AM

## 2017-03-16 NOTE — Anesthesia Procedure Notes (Signed)
Central Venous Catheter Insertion Performed by: Cristela BlueJackson, Corrissa Martello, MD, anesthesiologist Start/End12/30/2018 11:39 AM, 03/16/2017 11:56 AM Patient location: Pre-op. Preanesthetic checklist: patient identified, IV checked, site marked, risks and benefits discussed, surgical consent, monitors and equipment checked, pre-op evaluation, timeout performed and anesthesia consent Lidocaine 1% used for infiltration and patient sedated Hand hygiene performed  and maximum sterile barriers used  Catheter size: 8 Fr Total catheter length 16. Central line was placed.Double lumen Procedure performed using ultrasound guided technique. Ultrasound Notes:anatomy identified, needle tip was noted to be adjacent to the nerve/plexus identified, no ultrasound evidence of intravascular and/or intraneural injection and image(s) printed for medical record Attempts: 1 Following insertion, dressing applied, line sutured and Biopatch. Post procedure assessment: blood return through all ports and free fluid flow  Patient tolerated the procedure well with no immediate complications.

## 2017-03-16 NOTE — Progress Notes (Signed)
PT Cancellation Note  Patient Details Name: Samuel DragonJoseph A Pino MRN: [id number] DOB: [birth year]   Cancelled Treatment:    Reason Eval/Treat Not Completed: Patient at procedure or test/unavailable.  Pt in OR for exploratory laporotomy for ?ischemic bowel.  Will see 12/15 as able. Jul 02, 2016  Nevis BingKen Radames Mejorado, PT [phone]  (pager)   Eliseo GumKenneth V Masaji Billups Jul 02, 2016, 2:04 PM

## 2017-03-16 NOTE — Transfer of Care (Signed)
Immediate Anesthesia Transfer of Care Note  Patient: Samuel [name]  Procedure(s) Performed: EXPLORATORY LAPAROTOMY (N/A )  Patient Location: ICU  Anesthesia Type:General  Level of Consciousness: Patient remains intubated per anesthesia plan  Airway & Oxygen Therapy: Patient remains intubated per anesthesia plan and Patient placed on Ventilator (see vital sign flow sheet for setting)  Post-op Assessment: Report given to RN and Post -op Vital signs reviewed and stable  Post vital signs: Reviewed and stable  Last Vitals:  Vitals:   03/09/2017 1012 03/05/2017 1019  BP:  (!) 127/58  Pulse: (!) 113 (!) 108  Resp:  (!) 37  Temp:    SpO2:  90%    Last Pain:  Vitals:   03/24/2017 0957  TempSrc:   PainSc: 10-Worst pain ever      Patients Stated Pain Goal: 0 (03/09/2017 0957)  Complications: No apparent anesthesia complications

## 2017-03-16 NOTE — Consult Note (Signed)
Independently examined pt, evaluated data & formulated above care plan with NP   83 year old man with COPD, CAD, extreme kyphoscoliosis and aortic stenosis status post T AVR admitted 12/11 with chest pain and underwent cardiac cath on 12/13 due to positive stress test showing nonobstructive coronary artery disease. Due to worsening abdominal pain.  He underwent CT scanning which showed portal venous gas, colonic pneumatosis and pneumoperitoneum.  Taken to the OR emergently.  Unfortunately found to have multiple areas of infarct and ischemic small and large bowel not amenable to surgery and comfort care recommended. He was brought back to the ICU intubated ventilated, unresponsive sedated on propofol and on levo fed at 25 mics, hypotensive. Ventilator and sedation orders will be written, he will be placed on IV fentanyl for pain and propofol be discontinued. We will fluid resuscitate him and hopefully he will come off Levophed. Apparently daughter and sister have been informed but we will confirm that our goal will be comfort care here since this is not amenable to surgery.  Comer Locketakesh V. Vassie LollAlva MD [phone]

## 2017-03-16 NOTE — Progress Notes (Signed)
Called 6E to have RN to come get dentures but was told [name] would not coming be back to that floor. Placed pink denture cup with upper and lower dentures in Patient Belonging bag and placed in patient belonging bag area in PACU with the patient belonging book filled out.

## 2017-03-16 NOTE — Op Note (Signed)
EXPLORATORY LAPAROTOMY  Procedure Note  Samuel DragonJoseph A [name] 03/09/2017   Pre-op Diagnosis: acute abdomen     Post-op Diagnosis: infarcted and ischemic small and large bowel  Procedure(s): EXPLORATORY LAPAROTOMY  Surgeon(s): Abigail MiyamotoBlackman, Michaila Kenney, MD  Dr. Lindie SpruceWyatt  Anesthesia: General  Staff:  Circulator: Josefa HalfAnderson, Peyton L, RN Physician Assistant: Jerre SimonFocht, Jessica L, Georgia[place] Relief Circulator: Lissa HoardMoore, Alana N, RN Scrub Person: Valentina LucksHardesty, Xinia T; Prewett, Carin Hockharles R, RN  Estimated Blood Loss: Minimal               Indications: This is an 83 year old gentleman who was admitted several days ago with chest pain.  He had a CT of his chest as well as his abdomen and pelvis which showed incidental free air.  He had a completely benign, nontender abdomen and a normal white count.  He underwent a cardiac catheterization yesterday.  He began developing severe abdominal pain late last night.  He was found to have acute abdomen on examination today.  A noncontrasted CT scan showed pneumatosis.  He was thus taken to the operating room emergently  Findings: The patient was found to have multiple areas of infarcted and ischemic small bowel from the ligament of Treitz all the way to the terminal ileum.  The right colon was also infarcted.  The left colon was grossly ischemic as well.  There was a palpable and dopplerable signal in the SMA.  This had the appearance of a wide embolic event.  The decision was made to terminate the procedure as this was felt to be non-survivable.  Procedure: The patient was brought to the operating room.  He was placed prone on the operating table.  General anesthesia was induced.  His abdomen was prepped and draped in usual sterile fashion.  He again was identified as correct patient.  A midline incision was then created with a scalpel.  I took this down through the subtenons tissue and fascia with the electrocautery.  The abdomen was opened and the entire length of the incision.  The small  bowel was eviscerated.  He was found to have both infarcted and ischemic areas traversing his entire small intestines.  The right colon was infarcted.  The left colon and rectum was extensively ischemic.  He had a palpable proximal SMA.  There were multiple diverticuli in the proximal small bowel.  There was no evidence of perforation of the bowel.  The stomach appeared normal.  There was a small, cirrhotic appearing liver.  At this point, Dr. Lindie SpruceWyatt and I discussed the findings.  This was felt to be non-correctable and nonsurvivable.  The decision was made to terminate the procedure as there was minimal viable small intestines. The patient's midline fascia was closed with a running #1 looped PDS suture.  Gauze and tape were then applied.  The patient was then taken, still intubated, from the operating room to the intensive care unit.          [name]   Date: 03/15/2017  Time: 1:27 PM

## 2017-03-16 NOTE — Anesthesia Procedure Notes (Signed)
Arterial Line Insertion Start/End12/06/2016 12:35 PM, 03/16/2017 12:40 PM Performed by: Shireen QuanButler, Dwanna Goshert R, CRNA, CRNA  Patient location: OR. Preanesthetic checklist: patient identified, IV checked, site marked, risks and benefits discussed, surgical consent, monitors and equipment checked, pre-op evaluation, timeout performed and anesthesia consent Left, brachial was placed Catheter size: 20 G Hand hygiene performed , maximum sterile barriers used  and Seldinger technique used  Attempts: 1 Procedure performed without using ultrasound guided technique. Following insertion, dressing applied and Biopatch. Post procedure assessment: normal  Patient tolerated the procedure well with no immediate complications.

## 2017-03-16 NOTE — Anesthesia Procedure Notes (Signed)
Procedure Name: Intubation Date/Time: 03/27/2017 12:29 PM Performed by: Margarita RanaHoltzman, Vikas Wegmann [place], CRNA Pre-anesthesia Checklist: Patient identified, Patient being monitored, Timeout performed, Emergency Drugs available and Suction available Patient Re-evaluated:Patient Re-evaluated prior to induction Oxygen Delivery Method: Circle System Utilized Preoxygenation: Pre-oxygenation with 100% oxygen Induction Type: IV induction Ventilation: Two handed mask ventilation required Laryngoscope Size: 3 and Glidescope Grade View: Grade I Tube type: Subglottic suction tube Tube size: 8.0 mm Number of attempts: 1 Airway Equipment and Method: Video-laryngoscopy Placement Confirmation: ETT inserted through vocal cords under direct vision,  positive ETCO2 and breath sounds checked- equal and bilateral Secured at: 23 cm Tube secured with: Tape Dental Injury: Teeth and Oropharynx as per pre-operative assessment

## 2017-03-16 NOTE — Progress Notes (Signed)
Attempted by two RNs to place an OG and NG tube without success. MD aware. Pearlean Sabina, [place] ScrapeSarah E, RN

## 2017-03-16 NOTE — Consult Note (Signed)
Reason for Consult:abdominal pain Referring Physician: Dr. Marzetta Board  Samuel [name] is an 83 y.o. male.  HPI: This is an 83 year old gentleman with multiple medical problems who was admitted on December 11 with chest pain or shortness of breath.   He ended up getting a CAT scan of his chest to rule out PE.  There were tiny areas of free air in the upper abdomen.  The patient, however, denied any abdominal pain.  His abdominal exam was apparently benign and he had a normal white count.  The following morning, he underwent a CAT scan of the abdomen and pelvis.  This again showed a tiny amount of free air in the right upper quadrant.  There were no inflammatory changes in the abdomen.  No clear evidence of diverticulitis or ulcer disease.  He continued to remain pain-free.  He underwent a heart catheterization yesterday.  Yesterday he developed some nausea and vomiting.  He again denied abdominal pain.  He is now tachypneic complaining of abdominal pain and shortness of breath.  Surgery has been consulted because of these acute changes.  His white blood count remains normal.  Past Medical History:  Diagnosis Date  . Abdominal aortic aneurysm ([place]) 11/21/2016   5.2 x 4.6   . Alcohol abuse   . Arthritis    "in the low vertebra"  . CHF (congestive heart failure) ([place])   . Chronic lower back pain   . Complication of anesthesia    NO NECK LINES ON RIGHT FOR SURGERY  . COPD (chronic obstructive pulmonary disease) ([place])   . Depression   . DIZZINESS, CHRONIC   . Essential hypertension, benign   . GERD (gastroesophageal reflux disease)    occ  . Perivalvular leak of prosthetic heart valve    echo (02/21/13): Mild LVH, EF 55-60%, aortic valve with mild to moderate perivalvular leak, AI, mean aortic valve gradient 6 mm of mercury, MAC.  . S/P TAVR (transcatheter aortic valve replacement) 01/21/2013   29 mm Edwards Sapien XT transcatheter heart valve placed via direct aortic approach through a  right anterior mini-thoracotomy  . Scoliosis/kyphoscoliosis 10/28/2011  . Shortness of breath 10/24/11   "all the time"    Past Surgical History:  Procedure Laterality Date  . INTRAOPERATIVE TRANSESOPHAGEAL ECHOCARDIOGRAM N/A 01/21/2013   Procedure: INTRAOPERATIVE TRANSESOPHAGEAL ECHOCARDIOGRAM;  Surgeon: Rexene Alberts, MD;  Location: [facility];  Service: Open Heart Surgery;  Laterality: N/A;  . LEFT AND RIGHT HEART CATHETERIZATION WITH CORONARY ANGIOGRAM N/A 10/27/2011   Procedure: LEFT AND RIGHT HEART CATHETERIZATION WITH CORONARY ANGIOGRAM;  Surgeon: Hillary Bow, MD;  Location: [facility] CATH LAB;  Service: Cardiovascular;  Laterality: N/A;  . LEFT HEART CATH AND CORONARY ANGIOGRAPHY N/A 03/28/2017   Procedure: LEFT HEART CATH AND CORONARY ANGIOGRAPHY;  Surgeon: Lorretta Harp, MD;  Location: [place] CV LAB;  Service: Cardiovascular;  Laterality: N/A;  . NO PAST SURGERIES    . THORACOTOMY Right 01/21/2013   Procedure: MINI/LIMITED THORACOTOMY;  Surgeon: Rexene Alberts, MD;  Location: [place];  Service: Open Heart Surgery;  Laterality: Right;  . TRANSCATHETER AORTIC VALVE REPLACEMENT, TRANSAORTIC N/A 01/21/2013   Procedure: TRANSCATHETER AORTIC VALVE REPLACEMENT, TRANSAORTIC;  Surgeon: Rexene Alberts, MD;  Location: [place];  Service: Open Heart Surgery;  Laterality: N/A;    Family History  Problem Relation Age of Onset  . Heart failure Mother 39  . Thyroid disease Mother   . Heart attack Father 43  . Stroke Father   . Other  Unknown        no known medical hx of heart disease  . Heart attack Brother 86  . Diabetes Brother   . Heart attack Brother 51  . Diabetes Sister   . Hypertension Sister     Social History:  reports that he quit smoking about 64 years ago. His smoking use included cigarettes. He quit after 2.50 years of use. he has never used smokeless tobacco. He reports that he drinks about 1.8 - 3.6 oz of alcohol per week. He reports that he does not use  drugs.  Allergies: No Known Allergies  Medications: I have reviewed the patient's current medications.  Results for orders placed or performed during the hospital encounter of 03/18/2017 (from the past 48 hour(s))  Protime-INR     Status: None   Collection Time: 03/06/2017  9:38 AM  Result Value Ref Range   Prothrombin Time 14.0 11.4 - 15.2 seconds   INR 1.09   CBC     Status: Abnormal   Collection Time: 03/27/2017  9:22 PM  Result Value Ref Range   WBC 10.4 4.0 - 10.5 K/uL   RBC 4.78 4.22 - 5.81 MIL/uL   Hemoglobin 15.3 13.0 - 17.0 g/dL   HCT 43.9 39.0 - 52.0 %   MCV 91.8 78.0 - 100.0 fL   MCH 32.0 26.0 - 34.0 pg   MCHC 34.9 30.0 - 36.0 g/dL   RDW 12.3 11.5 - 15.5 %   Platelets 132 (L) 150 - 400 K/uL  Basic metabolic panel     Status: Abnormal   Collection Time: 03/23/2017  3:44 AM  Result Value Ref Range   Sodium 137 135 - 145 mmol/L   Potassium 3.9 3.5 - 5.1 mmol/L   Chloride 100 (L) 101 - 111 mmol/L   CO2 26 22 - 32 mmol/L   Glucose, Bld 148 (H) 65 - 99 mg/dL   BUN 21 (H) 6 - 20 mg/dL   Creatinine, Ser 1.62 (H) 0.61 - 1.24 mg/dL   Calcium 8.4 (L) 8.9 - 10.3 mg/dL   GFR calc non Af Amer 36 (L) >60 mL/min   GFR calc Af Amer 42 (L) >60 mL/min    Comment: (NOTE) The eGFR has been calculated using the CKD EPI equation. This calculation has not been validated in all clinical situations. eGFR's persistently <60 mL/min signify possible Chronic Kidney Disease.    Anion gap 11 5 - 15  CBC     Status: Abnormal   Collection Time: 04/01/2017  3:44 AM  Result Value Ref Range   WBC 6.9 4.0 - 10.5 K/uL   RBC 5.11 4.22 - 5.81 MIL/uL   Hemoglobin 16.7 13.0 - 17.0 g/dL   HCT 47.4 39.0 - 52.0 %   MCV 92.8 78.0 - 100.0 fL   MCH 32.7 26.0 - 34.0 pg   MCHC 35.2 30.0 - 36.0 g/dL   RDW 12.5 11.5 - 15.5 %   Platelets 144 (L) 150 - 400 K/uL    Nm Myocar Multi W/spect W/wall Motion / Ef  Result Date: 03/14/2017  There was no ST segment deviation noted during stress.  No T wave inversion  was noted during stress.  Defect 1: There is a large defect of moderate severity present in the basal inferior, basal inferolateral, basal anterolateral, mid inferior and mid inferolateral location.  Findings consistent with ischemia.  This is an intermediate risk study.  The left ventricular ejection fraction is normal (55-65%).  There is an artifact, however there  is a large reversible defect in the basal and mid inferior, inferolateral and basel anterolateral walls suspicious for ischemia in the LCX territory.    Dg Abd Portable 2v  Result Date: 03/09/2017 CLINICAL DATA:  Nausea and vomiting. Cardiac catheterization earlier today. EXAM: PORTABLE ABDOMEN - 2 VIEW COMPARISON:  CT 03/26/2017 FINDINGS: The free intraperitoneal air observed on CT is not conclusively visible on these radiographs. There is a large volume colonic stool and air. Enteric contrast is present throughout many of the left hemicolon diverticula, from the recent CT. Generous volume of air throughout nonobstructed small bowel. IMPRESSION: No evidence of bowel obstruction. The free intraperitoneal air observed on CT is not conclusively visible on these radiographic images. Electronically Signed   By: Andreas Newport M.D.   On: 04/02/2017 19:13    ROS Blood pressure 126/62, pulse (!) 111, temperature (!) 97.5 F (36.4 C), temperature source Oral, resp. rate (!) 24, height 5' 8" (1.727 m), weight 64.7 kg (142 lb 10.2 oz), SpO2 93 %. Physical Exam  Constitutional: He appears distressed.  Thin, cachectic gentleman  HENT:  Head: Normocephalic and atraumatic.  Right Ear: External ear normal.  Left Ear: External ear normal.  Nose: Nose normal.  Eyes: Pupils are equal, round, and reactive to light. No scleral icterus.  Neck: Normal range of motion. No tracheal deviation present.  Cardiovascular: Normal heart sounds.  No murmur heard. Tachycardic  Respiratory:  Increased respiratory effort with occasional rales  GI:   Abdomen is flat and soft but there is tenderness and guarding which may be greatest in the left lower quadrant  Musculoskeletal: Normal range of motion. He exhibits no edema.  Neurological:  He is difficult to understand all conversation is slightly somnolent  Skin: Skin is warm and dry.    Assessment/Plan: Abdominal pain of uncertain etiology  I am going to order a stat CAT scan of the abdomen without contrast.  Again, his white blood count is normal.  There is been no drop in his hemoglobin.  He may have aspirated.  A change in his abdominal exam is worrisome.  He may require an exploratory laparotomy.  Given his overall chronic medical condition, and worried he would do poorly with surgery.  BLACKMAN,DOUGLAS A 03/29/2017, 7:39 AM

## 2017-03-16 NOTE — Anesthesia Postprocedure Evaluation (Signed)
Anesthesia Post Note  Patient: Samuel [name]  Procedure(s) Performed: EXPLORATORY LAPAROTOMY (N/A )     Patient location during evaluation: SICU Anesthesia Type: General Level of consciousness: patient remains intubated per anesthesia plan Pain management: pain level controlled Vital Signs Assessment: post-procedure vital signs reviewed and stable Respiratory status: patient remains intubated per anesthesia plan Cardiovascular status: blood pressure returned to baseline and stable Anesthetic complications: no Comments: Not expected to survive    Last Vitals:  Vitals:   Nov 23, 2016 1012 Nov 23, 2016 1019  BP:  (!) 127/58  Pulse: (!) 113 (!) 108  Resp:  (!) 37  Temp:    SpO2:  90%    Last Pain:  Vitals:   Nov 23, 2016 0957  TempSrc:   PainSc: 10-Worst pain ever                 Jiles GarterJACKSON,Letticia Bhattacharyya EDWARD

## 2017-03-16 NOTE — Progress Notes (Addendum)
PROGRESS NOTE  Samuel [name] ZOX:[id number] DOB: [birth year] DOA: 04/02/2017 PCP: Veryl Speak, FNP   LOS: 2 days   Brief Narrative / Interim history: Samuel [name] is a 83 y.o. male with medical history significant of hypertension, COPD, GERD, depression, anxiety, aortic stenosis, s/p of TAVR, CHF, alcohol abuse, AAA, CKD2, kyphoscoliosis, who presents with chest pain and shortness of breath.  He was admitted to the hospital, cardiology was consulted, underwent a stress test which was positive and this was followed by cardiac catheterization on 12/13.  Later that evening, after patient ate dinner, he was complaining of nausea and had an episode of emesis.  His abdominal pain progressed, and he underwent a CT scan of the abdomen and pelvis which showed concern for ischemic bowel.  General surgery was consulted and he was taken to the operating room on 12/14.  Assessment & Plan: Principal Problem:   Dyspnea Active Problems:   Alcohol abuse   Essential hypertension, benign   COPD with chronic bronchitis (HCC)   Physical deconditioning   Abdominal aortic aneurysm (HCC)   S/P TAVR (transcatheter aortic valve replacement)   Dehydration   GERD (gastroesophageal reflux disease)   Chronic diastolic CHF (congestive heart failure) (HCC)   CKD (chronic kidney disease), stage II   Anxiety   Hyponatremia   Chest pain  Chest pain -cardiology following, patient underwent stress testing on 12/12 which was positive and showed an area of reversible ischemia. This was followed up by a cardiac catheterization 12/13 without significant obstructive disease. -CTA without PE  Bowel ischemia with bowel pneumatosis -tiny amount of free intraperitoneal air on the initial CT scan, he had no clinical findings, he had no abdominal pain, was tolerating diet and having bowel movements, had no peritoneal signs, was afebrile and had no leukocytosis.  Following cardiac catheterization patient started having  nausea vomiting and abdominal pain, CT scan of the abdomen and pelvis very different from the original one with small bowel pneumatosis, portal venous gas consistent with bowel ischemia.  General surgery was consulted and patient was taken to the operating room on 12/14.  Will be transferred to the ICU afterwards, d/w critical care team Dr. Vassie Loll  Acute hypoxic respiratory failure due to aspiration pneumonia -He is more tachypneic this morning and he is more hypoxic, likely due to aspiration in the setting of emesis -Start empiric Zosyn which will cover as well for #1  COPD with chronic bronchitis -prn albuterol nebs and mucinex   HTN:  -Continue home medications: Amlodipine, metoprolol -IV hydralazine prn  Chronic diastolic CHF -2-D echo and 60/60/17 showed EF 60-65% with grade 1 diastolic dysfunction. Patient does not have leg edema or JVD. CHF is compensated. -Continue metoprolol  GERD -Pepcid  AoCKD-II: Baseline Cre is 1.0-1.2, pt's Cre is 1.39 on admission.  -Creatinine worsening today at 1.6 likely in the setting of worsening clinical picture as per #1 it would be too early to be related to the contrast that he get -it would be too early to be due to the contrast that he received during the catheterization  Alcohol abuse -Did counseling about the importance of quitting drinking -CIWA protocol -determined to quit  Hyponatremia -Na 130. Mental status is normal. Likely due to alcohol abuse and Lasix use. -Hold her Lasix as above -Na 134 today, stop IVF  Anxiety: -prn xanax  Abdominal aortic aneurysm (HCC)  -CTA on 11/21/16 showed increased size of infrarenal abdominal aortic aneurysm, now measuring 5.2 x 4.6 cm, previously 5.1  x 4.0 cm. Recommend follow up by abdomen and pelvis CTA in 3-6 months, and vascular surgery referral/consultation if not already obtained -May need to give referral to vascular surgeon  S/P TAVR (transcatheter aortic valve  replacement): -stable. No acute issues    DVT prophylaxis: Lovenox Code Status: Full code Family Communication: no family at bedside. Updated sister over the phone, could not reach daughter Disposition Plan: TBD  Consultants:   Cardiology   General surgery  Procedures:   2D echo  Cardiac catheterizations  Antimicrobials:  Zosyn  Subjective: -States that he is feeling "rough" this morning, complains of mild shortness of breath, no chest pain, complains of abdominal pain  Objective: Vitals:   03/22/2017 1000 04/01/2017 1012 03/13/2017 1019 03/15/2017 1059  BP:   (!) 127/58   Pulse: (!) 115 (!) 113 (!) 108   Resp: (!) 44  (!) 37   Temp:      TempSrc:      SpO2: 90%  90%   Weight:      Height:    5\' 8"  (1.727 m)    Intake/Output Summary (Last 24 hours) at 03/31/2017 1159 Last data filed at 03/30/2017 0844 Gross per 24 hour  Intake 820 ml  Output 1050 ml  Net -230 ml   Filed Weights   03/14/17 0315 04/01/2017 0423 03/29/2017 0500  Weight: 65 kg (143 lb 3.2 oz) 64 kg (141 lb 1.6 oz) 64.7 kg (142 lb 10.2 oz)    Examination:  Constitutional: Appears tachypneic Eyes:  lids and conjunctivae normal ENMT: Mucous membranes are moist.  Neck: normal, supple Respiratory: Coarse breath sounds bilaterally, no wheezing, no crackles.  Increased respiratory effort Cardiovascular: Regular rate and rhythm, no murmurs / rubs / gallops. No LE edema. 2+ pedal pulses.  Abdomen: Diffuse tenderness, decreased bowel sounds, guarding with palpation Skin: no rashes Neurologic: non focal    Data Reviewed: I have independently reviewed following labs and imaging studies   CBC: Recent Labs  Lab 03/25/2017 0308 03/04/2017 2122 03/15/2017 0344  WBC 9.2 10.4 6.9  HGB 14.2 15.3 16.7  HCT 40.5 43.9 47.4  MCV 88.6 91.8 92.8  PLT 170 132* 144*   Basic Metabolic Panel: Recent Labs  Lab 04/02/2017 0308 03/06/2017 1024 03/14/17 0547 03/16/17 0344  NA 130*  --  134* 137  K 4.2  --  4.1 3.9  CL  91*  --  94* 100*  CO2 25  --  32 26  GLUCOSE 114*  --  104* 148*  BUN 18  --  18 21*  CREATININE 1.39*  --  1.22 1.62*  CALCIUM 8.4*  --  8.2* 8.4*  MG  --  1.9 2.0  --    GFR: Estimated Creatinine Clearance: 28.8 mL/min (A) (by C-G formula based on SCr of 1.62 mg/dL (H)). Liver Function Tests: Recent Labs  Lab 03/21/2017 0308  AST 43*  ALT 13*  ALKPHOS 91  BILITOT 0.9  PROT 7.4  ALBUMIN 3.4*   No results for input(s): LIPASE, AMYLASE in the last 168 hours. No results for input(s): AMMONIA in the last 168 hours. Coagulation Profile: Recent Labs  Lab 03/25/2017 0938  INR 1.09   Cardiac Enzymes: Recent Labs  Lab 03/16/2017 0512 03/16/2017 1024 03/19/2017 1610  TROPONINI <0.03 <0.03 <0.03   BNP (last 3 results) No results for input(s): PROBNP in the last 8760 hours. HbA1C: Recent Labs    03/14/17 0547  HGBA1C 5.4   CBG: No results for input(s): GLUCAP in the last  168 hours. Lipid Profile: Recent Labs    03/14/17 0547  CHOL 110  HDL 42  LDLCALC 55  TRIG 66  CHOLHDL 2.6   Thyroid Function Tests: No results for input(s): TSH, T4TOTAL, FREET4, T3FREE, THYROIDAB in the last 72 hours. Anemia Panel: No results for input(s): VITAMINB12, FOLATE, FERRITIN, TIBC, IRON, RETICCTPCT in the last 72 hours. Urine analysis:    Component Value Date/Time   COLORURINE YELLOW 03/26/2017 0706   APPEARANCEUR CLEAR 03/06/2017 0706   LABSPEC 1.021 03/21/2017 0706   PHURINE 6.0 03/05/2017 0706   GLUCOSEU NEGATIVE 03/28/2017 0706   GLUCOSEU NEG mg/dL 11/91/478204/12/2006 [id number]   HGBUR SMALL (A) 03/26/2017 0706   BILIRUBINUR NEGATIVE 03/08/2017 0706   KETONESUR NEGATIVE 03/14/2017 0706   PROTEINUR NEGATIVE 03/31/2017 0706   UROBILINOGEN 0.2 01/17/2013 1351   NITRITE NEGATIVE 03/26/2017 0706   LEUKOCYTESUR NEGATIVE 03/06/2017 0706   Sepsis Labs: Invalid input(s): PROCALCITONIN, LACTICIDVEN  No results found for this or any previous visit (from the past 240 hour(s)).    Radiology  Studies: Ct Abdomen Pelvis Wo Contrast  Result Date: 04/02/2017 CLINICAL DATA:  Perforation suspected.  Peritonitis.  Follow-up. EXAM: CT ABDOMEN AND PELVIS WITHOUT CONTRAST TECHNIQUE: Multidetector CT imaging of the abdomen and pelvis was performed following the standard protocol without IV contrast. COMPARISON:  Plain films of 03/22/2017.  Prior CT of 03/26/2017. FINDINGS: Lower chest: Right greater than left base airspace disease is progressive. Prior aortic valve repair. Normal heart size. Trace right pleural fluid. Hepatobiliary: Development of extensive pneumobilia. No focal liver lesion. Gallbladder distension at 11.4 cm. No calcified stone. No biliary duct dilatation. Pancreas: Pancreatic atrophy, without acute inflammation. Spleen: Normal in size, without focal abnormality. Adrenals/Urinary Tract: Normal adrenal glands. Bilateral renal cortical thinning. Right renal low-density lesions are likely cysts. No hydronephrosis. Bladder wall thickening with small diverticula. Stomach/Bowel: Normal stomach, without wall thickening. Extensive colonic diverticulosis. Normal colon and terminal ileum. Extensive right-sided small bowel pneumatosis, including on image 53 and 45/series 3. Vascular/Lymphatic: Advanced aortic and branch vessel atherosclerosis. Infrarenal abdominal aortic aneurysm again identified at on the order of 5.5 x 4.4 cm, similar. Air identified within the SMV, splenic vein, and portal vein. No abdominopelvic adenopathy. Reproductive: Mild prostatomegaly. Other: Small volume perihepatic ascites. Small volume right upper quadrant free intraperitoneal air. Musculoskeletal: Degenerate changes of both hips. Lumbar spine convex left curvature. Advanced lumbosacral spondylosis. IMPRESSION: 1. Constellation of findings, including small bowel pneumatosis, free intraperitoneal air, and portal venous gas. Most consistent with bowel ischemia. 2. Small volume abdominal ascites, also likely secondary. 3.  Similar abdominal aortic aneurysm. 4. Worsened bibasilar aeration. Most likely atelectasis. Early aspiration or pneumonia cannot be excluded. 5. Prostatomegaly with a component of bladder outlet obstruction. 6.  Aortic Atherosclerosis (ICD10-I70.0). These results were called by telephone at the time of interpretation on 03/23/2017 at 9:43 am to [facility] LLCJessica Focht, p.a. who verbally acknowledged these results. Electronically Signed   By: Jeronimo GreavesKyle  Talbot M.D.   On: 03/24/2017 09:46   Nm Myocar Multi W/spect W/wall Motion / Ef  Result Date: 03/14/2017  There was no ST segment deviation noted during stress.  No T wave inversion was noted during stress.  Defect 1: There is a large defect of moderate severity present in the basal inferior, basal inferolateral, basal anterolateral, mid inferior and mid inferolateral location.  Findings consistent with ischemia.  This is an intermediate risk study.  The left ventricular ejection fraction is normal (55-65%).  There is an artifact, however there is a large reversible defect  in the basal and mid inferior, inferolateral and basel anterolateral walls suspicious for ischemia in the LCX territory.    Dg Chest Port 1 View  Result Date: 2017-04-08 CLINICAL DATA:  Respiratory distress EXAM: PORTABLE CHEST 1 VIEW COMPARISON:  03/16/2017 FINDINGS: Cardiac shadow is stable. Prior TAVR is seen and stable. Postsurgical changes in the anterior chest wall are again seen. Severe scoliosis concave to the left is noted. No acute bony abnormality is seen. Some mild right basilar atelectatic changes are noted. IMPRESSION: Chronic changes similar to that seen on the prior exam. New mild right basilar atelectasis. Electronically Signed   By: Alcide Clever M.D.   On: Apr 08, 2017 09:48   Dg Abd Portable 2v  Result Date: Apr 08, 2017 CLINICAL DATA:  Right-sided abdominal pain. Nausea. Back and hip pain. History of AAA. EXAM: PORTABLE ABDOMEN - 2 VIEW COMPARISON:  CT 08-Apr-2017 abdominal  series 1213 2018 . FINDINGS: Distended loops of small and large bowel noted. Reference is made to prior CT report of 04-08-17 with reveals extensive pneumatosis and air within the portal venous system as well as free intraperitoneal air. Portal venous air is again noted. Free intraperitoneal air again noted. Oral contrast noted in colonic diverticuli. Aortic and visceral vascular calcifications again noted. Lumbar spine scoliosis and degenerative change. Degenerative changes both hips. IMPRESSION: 1. Distended loops of small and large bowel noted. Reference is made to prior CT report 2017-04-08 which reveals extensive pneumatosis and air within the portal venous system as well as free intraperitoneal air. Portal venous air is again noted. Free intraperitoneal air is again noted . Again bowel ischemia with perforation could present in this fashion . 2. Aortic and visceral vascular disease again noted. Electronically Signed   By: Maisie Fus  Register   On: 04-08-17 10:29   Dg Abd Portable 2v  Result Date: 03/11/2017 CLINICAL DATA:  Nausea and vomiting. Cardiac catheterization earlier today. EXAM: PORTABLE ABDOMEN - 2 VIEW COMPARISON:  CT 03/24/2017 FINDINGS: The free intraperitoneal air observed on CT is not conclusively visible on these radiographs. There is a large volume colonic stool and air. Enteric contrast is present throughout many of the left hemicolon diverticula, from the recent CT. Generous volume of air throughout nonobstructed small bowel. IMPRESSION: No evidence of bowel obstruction. The free intraperitoneal air observed on CT is not conclusively visible on these radiographic images. Electronically Signed   By: Ellery Plunk M.D.   On: 03/23/2017 19:13     Scheduled Meds: . [MAR Hold] amLODipine  7.5 mg Oral Daily  . [MAR Hold] aspirin  81 mg Oral Daily  . [MAR Hold] aspirin EC  325 mg Oral Daily  . [MAR Hold] cholecalciferol  5,000 Units Oral Daily  . [MAR Hold] enoxaparin (LOVENOX)  injection  40 mg Subcutaneous Q24H  . [MAR Hold] famotidine  20 mg Oral Daily  . [MAR Hold] folic acid  1 mg Oral Daily  . [MAR Hold] LORazepam  0-4 mg Intravenous Q12H  . [MAR Hold] magnesium oxide  200 mg Oral BID  . [MAR Hold] metoprolol succinate  25 mg Oral Daily  . [MAR Hold] multivitamin with minerals  1 tablet Oral Daily  . [MAR Hold] selenium  100 mcg Oral Daily  . [MAR Hold] sodium chloride flush  3 mL Intravenous Q12H  . [MAR Hold] thiamine  100 mg Oral Daily   Or  . [MAR Hold] thiamine  100 mg Intravenous Daily  . [MAR Hold] vitamin B-12  50 mcg Oral Daily   Continuous  Infusions: . [MAR Hold] sodium chloride    . norepinephrine (LEVOPHED) Adult infusion    . [MAR Hold] piperacillin-tazobactam    . [ZOX[MAR Hold] piperacillin-tazobactam (ZOSYN)  IV       Pamella Pertostin Huy Majid, MD, PhD [facility] Pager [phone]  If 7PM-7AM, please contact night-coverage [email or web address] Password [email or web address] 2016/12/10, 11:59 AM

## 2017-03-16 NOTE — Progress Notes (Addendum)
Patient ID: Samuel [name], male   DOB: 30-May-1926, 83 y.o.   MRN: [id number]   I explained the intra-op finding to the patient's daughter and his sister.  I explained that he had extensive infarcted and ischemic small and large bowel and that this was not a survivable event.  I explained that his prognosis is terminal and comfort care is recommended. Dr. Elvera LennoxGherghe is aware of the findings at surgery.  CCM was contacted preop.

## 2017-03-16 NOTE — Progress Notes (Signed)
Pharmacy Antibiotic Note  Samuel [name] is a 83 y.o. male admitted on 03/06/2017, now with concern for aspiration.  Pharmacy has been consulted for Zosyn dosing.  Plan: Zosyn 3.375g IV q8h (4 hour infusion).  Height: 5\' 8"  (172.7 cm) Weight: 142 lb 10.2 oz (64.7 kg) IBW/kg (Calculated) : 68.4  Temp (24hrs), Avg:97.7 F (36.5 C), Min:97.5 F (36.4 C), Max:98 F (36.7 C)  Recent Labs  Lab 03/25/2017 0308 03/14/17 0547 03/04/2017 2122 03/25/2017 0344  WBC 9.2  --  10.4 6.9  CREATININE 1.39* 1.22  --  1.62*    Estimated Creatinine Clearance: 28.8 mL/min (A) (by C-G formula based on SCr of 1.62 mg/dL (H)).    No Known Allergies   Thank you for allowing pharmacy to be a part of this patient's care.  Samuel [name], PharmD, BCPS  03/19/2017 7:58 AM

## 2017-03-16 NOTE — Progress Notes (Signed)
Patient placed on 2L nasal canula. No longer with complaints of N/V, but is coughing up dark yellow/brown mucus. The mucus smells and looks similar to the vomit produced at shift change. Patients RA sats 88% w/o c/o of SOB, just frequent coughing to clear secretions. On 2 liters sats 95%. [facility]   No complaints of hip or flank pain since PRN medications administered. Ben-gay and hot-packs with greatest relief.

## 2017-03-16 NOTE — Progress Notes (Signed)
Surgical PCR collected and sent to the labb. CHG wipes done. Conscent obtained from the pt who is aware of the procedure and plan. Report given to Short stay nurse Sheppard PentonLee Ann.  Colleen Canesar Jovahn Breit, RN

## 2017-03-17 DIAGNOSIS — Z9889 Other specified postprocedural states: Secondary | ICD-10-CM

## 2017-03-17 DIAGNOSIS — Z515 Encounter for palliative care: Secondary | ICD-10-CM

## 2017-03-17 MED ORDER — VASOPRESSIN 20 UNIT/ML IV SOLN
0.0300 [IU]/min | INTRAVENOUS | Status: DC
  Administered 2017-03-17: 0.03 [IU]/min via INTRAVENOUS
  Filled 2017-03-17: qty 2

## 2017-03-17 MED ORDER — NOREPINEPHRINE BITARTRATE 1 MG/ML IV SOLN
0.0000 ug/min | INTRAVENOUS | Status: DC
  Administered 2017-03-17: 30 ug/min via INTRAVENOUS
  Administered 2017-03-17 (×2): 40 ug/min via INTRAVENOUS
  Filled 2017-03-17 (×3): qty 16

## 2017-03-17 MED ORDER — DEXTROSE 5 % IV SOLN
0.5000 ug/min | INTRAVENOUS | Status: DC
Start: 2017-03-17 — End: 2017-03-17
  Administered 2017-03-17: 0.5 ug/min via INTRAVENOUS
  Administered 2017-03-17: 10 ug/min via INTRAVENOUS
  Filled 2017-03-17 (×2): qty 4

## 2017-03-17 MED ORDER — SODIUM CHLORIDE 0.9 % IV SOLN
0.0000 ug/min | INTRAVENOUS | Status: DC
  Administered 2017-03-17 (×2): 400 ug/min via INTRAVENOUS
  Administered 2017-03-17: 300 ug/min via INTRAVENOUS
  Filled 2017-03-17 (×4): qty 4

## 2017-03-17 MED ORDER — SODIUM CHLORIDE 0.9 % IV SOLN
0.0000 ug/min | INTRAVENOUS | Status: DC
  Administered 2017-03-17: 300 ug/min via INTRAVENOUS
  Filled 2017-03-17 (×2): qty 8

## 2017-03-19 ENCOUNTER — Encounter (HOSPITAL_COMMUNITY): Payer: Self-pay | Admitting: Surgery

## 2017-04-03 NOTE — Progress Notes (Signed)
   2017/04/08 1400  Clinical Encounter Type  Visited With Patient and family together;Health care provider  Visit Type Death  Referral From Nurse  Consult/Referral To Chaplain  Stress Factors  Family Stress Factors Loss   Responded to a page to support a family EOL.  Arrived and met the family and provided emotional support and prayer.   Chaplain Katherene Ponto

## 2017-04-03 NOTE — Consult Note (Signed)
Consultation Note Date: 04-01-2017   Patient Name: Samuel [name]  DOB: Oct 24, 1926  MRN: [id number]  Age / Sex: 84 y.o., male  PCP: Golden Circle, FNP Referring Physician: Rigoberto Noel, MD  Reason for Consultation: Establishing goals of care, Non pain symptom management, Pain control, Psychosocial/spiritual support and Terminal Care  HPI/Patient Profile: 84 y.o. male  with past medical history of COPD, coronary artery disease, TVAR 4 years ago, severe kyphoscoliosis, admitted on 03/28/2017 with complaints of chest pain.  Patient underwent cardiac cath on 04/01/2017 due to positive stress test showing nonobstructive coronary artery disease.  Patient subsequently then developed abdominal pain.  He underwent a CT scan which showed colonic pneumatosis in pneumoperitoneum.  He was taken to the OR emergently. Unfortunately he was found to have multiple areas of infarct and ischemic small and large bowel.  This was not amenable to surgery and comfort care was recommended. Patient was returned to ICU intubated, on levo fed.  Consult ordered for "end of life", goals of care.   Clinical Assessment and Goals of Care: Chart reviewed, patient seen.  Staffed with bedside RN.  Patient's sister Jearld Pies, his daughter, Eulis Manly , granddaughter and nephew are at the bedside.  They recognize that he is at end of life.  They are talking in terms of  one-way extubation once his other daughter, Joaquim Lai arrives to the unit.  Patient is critically ill.  He is currently on 4 pressors and still hypotensive.  He is unresponsive and not on any sedation.  His daughter is struggling with withdrawing care despite knowing that there are no options at this point that could save her father's life.  This family has had a lot of losses in the hospital, ICU setting in the past  Patient's healthcare proxy's would be his daughter Lelon Frohlich and  Joaquim Lai.  Patient's sister, Jearld Pies also is clearly someone that is a source of support for patient as well as her nieces . At one point stated she was healthcare power of attorney when he underwent his TVAR procedure  Discussed at length patient's current level of life supporting measures (patient is currently on 4 pressors, intubated, unresponsive despite no sedation).  Engaged in life review of Mr. Taul and the type of father, brother he was.    SUMMARY OF RECOMMENDATIONS   Family in agreement for one-way extubation wants all family members arrived to the unit They are prepared for death to be within hours of extubation and discontinuation of pressors  Addendum: While we were having a goals of care meeting, patient began to decline despite aggressive life-saving measures and I called the family back into the room.  His blood pressure began dropping, heart rate dropping and he became heavily mottled up to his hip.  Patient died while on the ventilator Code Status/Advance Care Planning:  DNR   Psycho-social/Spiritual:   Desire for further Chaplaincy support:yes  Additional Recommendations: Grief/Bereavement Support  Prognosis:   Hours - Days  Discharge Planning: Anticipated Hospital Death  Primary Diagnoses: Present on Admission: . Essential hypertension, benign . COPD with chronic bronchitis ([place]) . Alcohol abuse . GERD (gastroesophageal reflux disease) . Chronic diastolic CHF (congestive heart failure) ([place]) . CKD (chronic kidney disease), stage II . Anxiety . Abdominal aortic aneurysm ([place]) . Hyponatremia . Chest pain . Dehydration . Physical deconditioning . Dyspnea   I have reviewed the medical record, interviewed the patient and family, and examined the patient. The following aspects are pertinent.  Past Medical History:  Diagnosis Date  . Abdominal aortic aneurysm ([place]) 11/21/2016   5.2 x 4.6   . Alcohol abuse   . Arthritis    "in the low  vertebra"  . CHF (congestive heart failure) ([place])   . Chronic lower back pain   . Complication of anesthesia    NO NECK LINES ON RIGHT FOR SURGERY  . COPD (chronic obstructive pulmonary disease) ([place])   . Depression   . DIZZINESS, CHRONIC   . Essential hypertension, benign   . GERD (gastroesophageal reflux disease)    occ  . Perivalvular leak of prosthetic heart valve    echo (02/21/13): Mild LVH, EF 55-60%, aortic valve with mild to moderate perivalvular leak, AI, mean aortic valve gradient 6 mm of mercury, MAC.  . 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 Scoliosis/kyphoscoliosis 10/28/2011  . Shortness of breath 10/24/11   "all the time"   Social History   Socioeconomic History  . Marital status: Widowed    Spouse name: None  . Number of children: 0  . Years of education: 11  . Highest education level: None  Social Needs  . Financial resource strain: None  . Food insecurity - worry: None  . Food insecurity - inability: None  . Transportation needs - medical: None  . Transportation needs - non-medical: None  Occupational History  . Occupation: Retired  Tobacco Use  . Smoking status: Former Smoker    Years: 2.50    Types: Cigarettes    Last attempt to quit: 07/02/1952    Years since quitting: 64.7  . Smokeless tobacco: Never Used  . Tobacco comment: wine x2 q nite in last week 01/17/13  Substance and Sexual Activity  . Alcohol use: Yes    Alcohol/week: 1.8 - 3.6 oz    Types: 3 - 6 Cans of beer per week    Comment: quite a history of alcohol abuse for many years, states currently drinks beer weekly but not much  . Drug use: No  . Sexual activity: No  Other Topics Concern  . None  Social History Narrative   Retired worked for [employer] in [employer]   Divorced: 05-26-2022 and 2nd died and 3rd was separated   Never  Smoked   Alcohol use -yes   Lives alone;   Family History  Problem Relation Age of Onset  . Heart failure Mother 49  . Thyroid disease Mother   . Heart attack Father 42  . Stroke Father   . Other Unknown        no known medical hx of heart disease  . Heart attack Brother 26  . Diabetes Brother   . Heart attack Brother 77  . Diabetes Sister   . Hypertension Sister    Scheduled Meds: . chlorhexidine gluconate (MEDLINE KIT)  15 mL Mouth Rinse BID  . enoxaparin (LOVENOX) injection  30 mg Subcutaneous  Q24H  . fentaNYL (SUBLIMAZE) injection  50 mcg Intravenous Once  . mouth rinse  15 mL Mouth Rinse 10 times per day   Continuous Infusions: . epinephrine 10 mcg/min (04-13-17 0956)  . fentaNYL infusion INTRAVENOUS Stopped (April 13, 2017 0400)  . norepinephrine (LEVOPHED) Adult infusion 40 mcg/min (04/13/2017 1316)  . phenylephrine (NEO-SYNEPHRINE) Adult infusion 350 mcg/min (04-13-17 1248)  . piperacillin-tazobactam (ZOSYN)  IV Stopped (04/13/17 0800)  . vasopressin (PITRESSIN) infusion - *FOR SHOCK* 0.03 Units/min (04/13/2017 0445)   PRN Meds:.fentaNYL, fentaNYL (SUBLIMAZE) injection, fentaNYL (SUBLIMAZE) injection, midazolam, midazolam Medications Prior to Admission:  Prior to Admission medications   Medication Sig Start Date End Date Taking? Authorizing Provider  ALPRAZolam Duanne Moron) 0.5 MG tablet Take 1 tablet (0.5 mg total) 2 (two) times daily as needed by mouth. for anxiety Patient taking differently: Take 0.25 mg by mouth 2 (two) times daily as needed (breathing). for anxiety 02/14/17  Yes Shambley, Delphia Grates, NP  amLODipine (NORVASC) 5 MG tablet Take 1.5 tablets (7.5 mg total) by mouth daily. Patient taking differently: Take 5 mg by mouth daily.  07/31/16  Yes Golden Circle, FNP  Bioflavonoid Products (ESTER C PO) Take 1,000 mg by mouth daily.   Yes [provider]  Cholecalciferol (VITAMIN D-3) 5000 UNITS TABS Take 1 capsule by mouth daily.    Yes [provider]    furosemide (LASIX) 20 MG tablet Take 2 tablets (40 mg total) by mouth daily. Patient taking differently: Take 20 mg by mouth daily.  12/05/16  Yes Lelon Perla, MD  metoprolol succinate (TOPROL-XL) 25 MG 24 hr tablet TAKE 1/2 TABLET BY MOUTH DAILY FOR BLOOD PRESSURE 01/31/17  Yes Lance Sell, NP  Multiple Vitamin (MULTIVITAMIN WITH MINERALS) TABS Take 1 tablet by mouth daily. 10/30/11  Yes Velvet Bathe, MD  ondansetron (ZOFRAN-ODT) 4 MG disintegrating tablet Take 1 tablet (4 mg total) by mouth every 8 (eight) hours as needed for nausea or vomiting. 11/27/16  Yes Golden Circle, FNP  POTASSIUM GLUCONATE ER PO Take 1 tablet by mouth daily as needed (when taking furosemide). OTC   Yes [provider]  SELENIUM PO Take 1 tablet by mouth daily.   Yes [provider]  Specialty Vitamins Products (MAGNESIUM, AMINO ACID CHELATE,) 133 MG tablet Take 1 tablet by mouth daily as needed (when taking furosemide).    Yes [provider]  traMADol (ULTRAM) 50 MG tablet Take 1 tablet (50 mg total) 2 (two) times daily as needed by mouth. Patient taking differently: Take 50 mg by mouth every 12 (twelve) hours as needed for moderate pain.  02/14/17  Yes Lance Sell, NP  vitamin B-12 (CYANOCOBALAMIN) 100 MCG tablet Take 50 mcg by mouth daily.     Yes [provider]   No Known Allergies Review of Systems  Unable to perform ROS: Intubated    Physical Exam  Constitutional:  Acutely ill-appearing elderly man: Intubated, unresponsive  HENT:  Head: Normocephalic and atraumatic.  Cardiovascular:  Irregular  Pulmonary/Chest:  Intubated  Abdominal:  Midline incision with blood soaked dressing  Genitourinary:  Genitourinary Comments: Foley  Neurological:  Unresponsive, no sedation  Skin:  Cool, mottling  Nursing note and vitals reviewed.   Vital Signs: BP (!) 45/27   Pulse 66   Temp 97.8 F (36.6 C) (Axillary)   Resp (!) 25   Ht _0  (1.727 m)    Wt 64.7 kg (142 lb 10.2 oz)   SpO2 93%   BMI 21.69 kg/m  Pain Assessment: CPOT POSS *See Group Information*: 1-Acceptable,Awake and alert Pain Score: 10-Worst pain ever   SpO2: SpO2: 93 % O2 Device:SpO2: 93 % O2 Flow Rate: .O2 Flow Rate (L/min): 4 L/min  IO: Intake/output summary:   Intake/Output Summary (Last 24 hours) at 03/24/17 1447 Last data filed at Mar 24, 2017 1400 Gross per 24 hour  Intake 4501.04 ml  Output 23 ml  Net 4478.04 ml    LBM: Last BM Date: 03/14/17 Baseline Weight: Weight: 63.7 kg (140 lb 8 oz) Most recent weight: Weight: 64.7 kg (142 lb 10.2 oz)     Palliative Assessment/Data:   Flowsheet Rows     Most Recent Value  Intake Tab  Referral Department  Critical care  Unit at Time of Referral  ICU  Date Notified  03/24/2017  Palliative Care Type  New Palliative care  Reason for referral  Clarify Goals of Care, Psychosocial or Spiritual support, End of Life Care Assistance  Date of Admission  03/24/2017  Date first seen by Palliative Care  03/24/17  # of days Palliative referral response time  1 Day(s)  # of days IP prior to Palliative referral  -1  Clinical Assessment  Palliative Performance Scale Score  20%  Pain Max last 24 hours  Not able to report  Pain Min Last 24 hours  Not able to report  Dyspnea Max Last 24 Hours  Not able to report  Dyspnea Min Last 24 hours  Not able to report  Nausea Max Last 24 Hours  Not able to report  Nausea Min Last 24 Hours  Not able to report  Anxiety Max Last 24 Hours  Not able to report  Anxiety Min Last 24 Hours  Not able to report  Other Max Last 24 Hours  Not able to report  Psychosocial & Spiritual Assessment  Palliative Care Outcomes  Patient/Family meeting held?  Yes  Who was at the meeting?  daughter, sister, grandddaughter, nephew  Palliative Care follow-up planned  No      Time In: 1330 Time Out: 1500 Time Total: 90 min Greater than 50%  of this time was spent counseling and coordinating care  related to the above assessment and plan.  Signed by: Dory Horn, NP   Please contact Palliative Medicine Team phone at [phone] for questions and concerns.  For individual provider: See Shea Evans

## 2017-04-03 NOTE — Progress Notes (Signed)
Wasted 200 mls of Fentanyl in the sink. Witnesses by Antonieta IbaSavannah Jenkins RN.

## 2017-04-03 NOTE — Progress Notes (Signed)
This was a Press photographercharge nurse page for a dying 84 year old male caucasian. Family on-site. Patient intubated and struggling with breaths. Family very expressive in emotional pain. Chaplain offered reflective listening , compassionate presence and prayer. Family needs more support.  Aleza Pew-Chaplain

## 2017-04-03 NOTE — Progress Notes (Signed)
Pt's family was speaking with palliative care NP. Pt's HR noted to be very irregular and dropping to the 30s. NP brought family back to room to be with pt. Family at bedside when patient pronounced dead. I was unable to obtain a pulse or auscultate heartbeat. Time of death [id number]. ME notified and patient is not an ME case. RT to remove ETT for family to view the body in the room.

## 2017-04-03 NOTE — Discharge Summary (Signed)
Death/ discharge note.   ADMISSION DATE:  03/19/2017 Date of death- 03/04/2017; time 14.11 hrs.  84 year old man with COPD, CAD, extreme kyphoscoliosis and aortic stenosis status post T AVR admitted 12/11 with chest pain and underwent cardiac cath on 12/13 due to positive stress test showing nonobstructive coronary artery disease. Due to worsening abdominal pain.  He underwent CT scanning which showed portal venous gas, colonic pneumatosis and pneumoperitoneum.  Taken to the OR emergently.  Unfortunately found to have multiple areas of infarct and ischemic small and large bowel not amenable to surgery and comfort care recommended. He was brought back to the ICU intubated ventilated, unresponsive sedated on propofol and on levo fed at 25 mics, hypotensive. Ventilator and sedation orders will be written, he will be placed on IV fentanyl for pain and propofol be discontinued. We will fluid resuscitate him and hopefully he will come off Levophed. Apparently daughter and sister have been informed but we will confirm that our goal will be comfort care here since this is not amenable to surgery.  Seen this am , critically ill, on 4 pressors, on vent, unresponsive.   PAST MEDICAL HISTORY :  He  has a past medical history of Abdominal aortic aneurysm ([facility]) (11/21/2016), Alcohol abuse, Arthritis, CHF (congestive heart failure) ([facility]), Chronic lower back pain, Complication of anesthesia, COPD (chronic obstructive pulmonary disease) ([facility]), Depression, DIZZINESS, CHRONIC, Essential hypertension, benign, GERD (gastroesophageal reflux disease), Perivalvular leak of prosthetic heart valve, S/P TAVR (transcatheter aortic valve replacement) (01/21/2013), Scoliosis/kyphoscoliosis (10/28/2011), and Shortness of breath (10/24/11).  PAST SURGICAL HISTORY: He  has a past surgical history that includes No past surgeries; Transcatheter aortic valve replacement, transaortic (N/A, 01/21/2013); Intraoprative  transesophageal echocardiogram (N/A, 01/21/2013); Thoracotomy (Right, 01/21/2013); left and right heart catheterization with coronary angiogram (N/A, 10/27/2011); and LEFT HEART CATH AND CORONARY ANGIOGRAPHY (N/A, 04/22/2016).    EXAMINATION: General:  Frail elderly male on vent Neuro:  Sedated RASS -3 HEENT:  [place]/AT, PERRL,  Cardiovascular:  RRR,   Lungs: caorse bilateral breath sounds.   Abdomen:  Soft, non-distended, surgical incision closed.  Musculoskeletal:  No acute deformity Skin:  Grossly intact.  poor skin perfusion  Vent parameters- PIP <  30  Blood pressure (!) 45/27, pulse 66, temperature 97.8 F (36.6 C), temperature source Axillary, resp. rate (!) 25, height 5\' 8"  (1.727 m), weight 142 lb 10.2 oz (64.7 kg), SpO2 93 %.  BMET      Recent Labs  Lab 03/06/2017 0308 03/14/17 0547 03/09/2017 0344  NA 130* 134* 137  K 4.2 4.1 3.9  CL 91* 94* 100*  CO2 25 32 26  BUN 18 18 21*  CREATININE 1.39* 1.22 1.62*  GLUCOSE 114* 104* 148*    Electrolytes       Recent Labs  Lab 03/05/2017 0308 03/14/2017 1024 03/14/17 0547 03/31/2017 0344  CALCIUM 8.4*  --  8.2* 8.4*  MG  --  1.9 2.0  --     CBC      Recent Labs  Lab 03/25/2017 0308 12/01/2016 2122 03/10/2017 0344  WBC 9.2 10.4 6.9  HGB 14.2 15.3 16.7  HCT 40.5 43.9 47.4  PLT 170 132* 144*    Coag's    Recent Labs  Lab 12/01/2016 0938  INR 1.09     Liver Enzymes    Recent Labs  Lab 03/25/2017 0308  AST 43*  ALT 13*  ALKPHOS 91  BILITOT 0.9  ALBUMIN 3.4*    Cardiac Enzymes      Recent Labs  Lab 03/21/2017 0512 03/07/2017 1024 04/02/2017 1610  TROPONINI <0.03 <0.03 <0.03     Imaging Ct Abdomen Pelvis Wo Contrast  Result Date: 03/23/2017 CLINICAL DATA:  Perforation suspected.  Peritonitis.  Follow-up. EXAM: CT ABDOMEN AND PELVIS WITHOUT CONTRAST TECHNIQUE: Multidetector CT imaging of the abdomen and pelvis was performed following the standard protocol without IV contrast. COMPARISON:  Plain  films of 06/12/2016.  Prior CT of 03/06/2017. FINDINGS: Lower chest: Right greater than left base airspace disease is progressive. Prior aortic valve repair. Normal heart size. Trace right pleural fluid. Hepatobiliary: Development of extensive pneumobilia. No focal liver lesion. Gallbladder distension at 11.4 cm. No calcified stone. No biliary duct dilatation. Pancreas: Pancreatic atrophy, without acute inflammation. Spleen: Normal in size, without focal abnormality. Adrenals/Urinary Tract: Normal adrenal glands. Bilateral renal cortical thinning. Right renal low-density lesions are likely cysts. No hydronephrosis. Bladder wall thickening with small diverticula. Stomach/Bowel: Normal stomach, without wall thickening. Extensive colonic diverticulosis. Normal colon and terminal ileum. Extensive right-sided small bowel pneumatosis, including on image 53 and 45/series 3. Vascular/Lymphatic: Advanced aortic and branch vessel atherosclerosis. Infrarenal abdominal aortic aneurysm again identified at on the order of 5.5 x 4.4 cm, similar. Air identified within the SMV, splenic vein, and portal vein. No abdominopelvic adenopathy. Reproductive: Mild prostatomegaly. Other: Small volume perihepatic ascites. Small volume right upper quadrant free intraperitoneal air. Musculoskeletal: Degenerate changes of both hips. Lumbar spine convex left curvature. Advanced lumbosacral spondylosis. IMPRESSION: 1. Constellation of findings, including small bowel pneumatosis, free intraperitoneal air, and portal venous gas. Most consistent with bowel ischemia. 2. Small volume abdominal ascites, also likely secondary. 3. Similar abdominal aortic aneurysm. 4. Worsened bibasilar aeration. Most likely atelectasis. Early aspiration or pneumonia cannot be excluded. 5. Prostatomegaly with a component of bladder outlet obstruction. 6.  Aortic Atherosclerosis (ICD10-I70.0). These results were called by telephone at the time of interpretation on  03/04/2017 at 9:43 am to [facility] CenterJessica Focht, p.a. who verbally acknowledged these results. Electronically Signed   By: Jeronimo GreavesKyle  Talbot M.D.   On: 03/26/2017 09:46   Dg Chest Port 1 View  Result Date: 04/01/2017 CLINICAL DATA:  Respiratory distress EXAM: PORTABLE CHEST 1 VIEW COMPARISON:  03/29/2017 FINDINGS: Cardiac shadow is stable. Prior TAVR is seen and stable. Postsurgical changes in the anterior chest wall are again seen. Severe scoliosis concave to the left is noted. No acute bony abnormality is seen. Some mild right basilar atelectatic changes are noted. IMPRESSION: Chronic changes similar to that seen on the prior exam. New mild right basilar atelectasis. Electronically Signed   By: Alcide CleverMark  Lukens M.D.   On: 04/02/2017 09:48   Dg Abd Portable 2v  Result Date: 03/31/2017 CLINICAL DATA:  Right-sided abdominal pain. Nausea. Back and hip pain. History of AAA. EXAM: PORTABLE ABDOMEN - 2 VIEW COMPARISON:  CT 03/03/2017 abdominal series 1213 2018 . FINDINGS: Distended loops of small and large bowel noted. Reference is made to prior CT report of 03/11/2017 with reveals extensive pneumatosis and air within the portal venous system as well as free intraperitoneal air. Portal venous air is again noted. Free intraperitoneal air again noted. Oral contrast noted in colonic diverticuli. Aortic and visceral vascular calcifications again noted. Lumbar spine scoliosis and degenerative change. Degenerative changes both hips. IMPRESSION: 1. Distended loops of small and large bowel noted. Reference is made to prior CT report 03/24/2017 which reveals extensive pneumatosis and air within the portal venous system as well as free intraperitoneal air. Portal venous air is again noted. Free intraperitoneal air is again noted .  Again bowel ischemia with perforation could present in this fashion . 2. Aortic and visceral vascular disease again noted. Electronically Signed   By: Maisie Fus  Register   On: 03/29/2017 10:29   Dg Abd  Portable 2v  Result Date: 03/12/2017 CLINICAL DATA:  Nausea and vomiting. Cardiac catheterization earlier today. EXAM: PORTABLE ABDOMEN - 2 VIEW COMPARISON:  CT 03/23/17 FINDINGS: The free intraperitoneal air observed on CT is not conclusively visible on these radiographs. There is a large volume colonic stool and air. Enteric contrast is present throughout many of the left hemicolon diverticula, from the recent CT. Generous volume of air throughout nonobstructed small bowel. IMPRESSION: No evidence of bowel obstruction. The free intraperitoneal air observed on CT is not conclusively visible on these radiographic images. Electronically Signed   By: Ellery Plunk M.D.   On: 03/23/2017 19:13     STUDIES:  CTA chest/abd/pelvis 12/11 > No evidence of pulmonary embolus. Scattered free air noted at the upper abdomen, concerning for bowel perforation. The location of perforation is not characterized on this study. CT of the abdomen and pelvis is recommended for further evaluation. 3. Diffuse wall thickening along the course of the esophagus may reflect chronic inflammation or esophagitis. Left heart cath 12/13 > non-obstructive CAD.  CT abdomen/pelvis 12/14 > Constellation of findings, including small bowel pneumatosis, free intraperitoneal air, and portal venous gas. Most consistent with bowel ischemia. Small volume abdominal ascites, also likely secondary. Similar abdominal aortic aneurysm. Worsened bibasilar aeration. Most likely atelectasis. Early aspiration or pneumonia cannot be excluded. Prostatomegaly with a component of bladder outlet obstruction.     ASSESSMENT / PLAN: Acute respiratory failure secondary to post-op setting Aspiration PNA COPD without exacerbation Shock, post op.  Chronic diastolic CHF (grade 2) Non obstructive CAD   S/p TAVR 2014 AAA Ischemic bowel to significant portions of both large and small bowel S/p laparotomy Acute metabolic encephalopathy   Pt  was treated with vent &  pressors. He was DNR. He peacefully expired .  Family were updated and at bedside.

## 2017-04-03 DEATH — deceased

## 2018-03-22 NOTE — Telephone Encounter (Signed)
error
